# Patient Record
Sex: Male | Born: 1990 | Race: White | Hispanic: No | Marital: Single | Smoking: Former smoker
Health system: Southern US, Community
[De-identification: ages and names within clinical notes are randomized; demographics above are authoritative.]

## PROBLEM LIST (undated history)

## (undated) ENCOUNTER — Ambulatory Visit (HOSPITAL_BASED_OUTPATIENT_CLINIC_OR_DEPARTMENT_OTHER): Source: Home / Self Care

## (undated) DIAGNOSIS — J45909 Unspecified asthma, uncomplicated: Secondary | ICD-10-CM

## (undated) DIAGNOSIS — F419 Anxiety disorder, unspecified: Secondary | ICD-10-CM

## (undated) DIAGNOSIS — K219 Gastro-esophageal reflux disease without esophagitis: Secondary | ICD-10-CM

## (undated) DIAGNOSIS — F988 Other specified behavioral and emotional disorders with onset usually occurring in childhood and adolescence: Secondary | ICD-10-CM

## (undated) DIAGNOSIS — K226 Gastro-esophageal laceration-hemorrhage syndrome: Secondary | ICD-10-CM

## (undated) HISTORY — DX: Gastro-esophageal laceration-hemorrhage syndrome: K22.6

## (undated) HISTORY — PX: FINGER SURGERY: SHX640

---
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
## 2012-09-26 DIAGNOSIS — K226 Gastro-esophageal laceration-hemorrhage syndrome: Secondary | ICD-10-CM

## 2012-09-26 HISTORY — DX: Gastro-esophageal laceration-hemorrhage syndrome: K22.6

## 2012-10-18 ENCOUNTER — Encounter (HOSPITAL_COMMUNITY): Payer: Self-pay

## 2012-10-18 ENCOUNTER — Encounter (HOSPITAL_COMMUNITY)
Admission: RE | Disposition: A | Discharge status: Home / Self Care | Payer: Self-pay | Source: Ambulatory Visit | Attending: Gastroenterology

## 2012-10-18 ENCOUNTER — Emergency Department (HOSPITAL_COMMUNITY)
Admission: EM | Admit: 2012-10-18 | Discharge: 2012-10-18 | Disposition: A | Discharge status: Home / Self Care | Payer: Medicaid Other | Attending: Emergency Medicine | Admitting: Emergency Medicine

## 2012-10-18 ENCOUNTER — Telehealth: Payer: Self-pay | Admitting: Gastroenterology

## 2012-10-18 ENCOUNTER — Ambulatory Visit: Payer: Self-pay | Admitting: Gastroenterology

## 2012-10-18 ENCOUNTER — Ambulatory Visit (HOSPITAL_COMMUNITY): Admit: 2012-10-18 | Payer: Self-pay | Admitting: Gastroenterology

## 2012-10-18 ENCOUNTER — Ambulatory Visit (HOSPITAL_COMMUNITY): Payer: Medicaid Other | Admitting: Anesthesiology

## 2012-10-18 ENCOUNTER — Observation Stay (HOSPITAL_COMMUNITY)
Admission: RE | Admit: 2012-10-18 | Discharge: 2012-10-19 | Disposition: A | Discharge status: Home / Self Care | Payer: Medicaid Other | Source: Ambulatory Visit | Attending: Gastroenterology | Admitting: Gastroenterology

## 2012-10-18 ENCOUNTER — Encounter (HOSPITAL_COMMUNITY): Payer: Self-pay | Admitting: Anesthesiology

## 2012-10-18 ENCOUNTER — Encounter (HOSPITAL_COMMUNITY): Payer: Self-pay | Admitting: *Deleted

## 2012-10-18 DIAGNOSIS — R51 Headache: Secondary | ICD-10-CM | POA: Insufficient documentation

## 2012-10-18 DIAGNOSIS — K226 Gastro-esophageal laceration-hemorrhage syndrome: Principal | ICD-10-CM

## 2012-10-18 DIAGNOSIS — F17200 Nicotine dependence, unspecified, uncomplicated: Secondary | ICD-10-CM | POA: Insufficient documentation

## 2012-10-18 DIAGNOSIS — Z5309 Procedure and treatment not carried out because of other contraindication: Secondary | ICD-10-CM | POA: Insufficient documentation

## 2012-10-18 DIAGNOSIS — R1013 Epigastric pain: Secondary | ICD-10-CM | POA: Insufficient documentation

## 2012-10-18 DIAGNOSIS — F411 Generalized anxiety disorder: Secondary | ICD-10-CM | POA: Insufficient documentation

## 2012-10-18 DIAGNOSIS — K219 Gastro-esophageal reflux disease without esophagitis: Secondary | ICD-10-CM | POA: Insufficient documentation

## 2012-10-18 DIAGNOSIS — Z8659 Personal history of other mental and behavioral disorders: Secondary | ICD-10-CM | POA: Insufficient documentation

## 2012-10-18 DIAGNOSIS — K208 Other esophagitis without bleeding: Secondary | ICD-10-CM

## 2012-10-18 DIAGNOSIS — K920 Hematemesis: Secondary | ICD-10-CM | POA: Insufficient documentation

## 2012-10-18 DIAGNOSIS — D62 Acute posthemorrhagic anemia: Secondary | ICD-10-CM | POA: Insufficient documentation

## 2012-10-18 DIAGNOSIS — R112 Nausea with vomiting, unspecified: Secondary | ICD-10-CM | POA: Insufficient documentation

## 2012-10-18 HISTORY — DX: Gastro-esophageal reflux disease without esophagitis: K21.9

## 2012-10-18 HISTORY — DX: Other specified behavioral and emotional disorders with onset usually occurring in childhood and adolescence: F98.8

## 2012-10-18 HISTORY — DX: Anxiety disorder, unspecified: F41.9

## 2012-10-18 HISTORY — PX: ESOPHAGOGASTRODUODENOSCOPY (EGD) WITH PROPOFOL: SHX5813

## 2012-10-18 HISTORY — PX: ESOPHAGOGASTRODUODENOSCOPY: SHX5428

## 2012-10-18 LAB — CBC WITH DIFFERENTIAL/PLATELET
Eosinophils Relative: 4 % (ref 0–5)
Lymphocytes Relative: 27 % (ref 12–46)
MCH: 29.4 pg (ref 26.0–34.0)
MCV: 82 fL (ref 78.0–100.0)
Neutrophils Relative %: 59 % (ref 43–77)
WBC: 9.1 10*3/uL (ref 4.0–10.5)

## 2012-10-18 LAB — LIPASE, BLOOD: Lipase: 16 U/L (ref 11–59)

## 2012-10-18 LAB — URINALYSIS, MICROSCOPIC ONLY
Bilirubin Urine: NEGATIVE
Ketones, ur: NEGATIVE mg/dL
Nitrite: NEGATIVE
Specific Gravity, Urine: 1.034 — ABNORMAL HIGH (ref 1.005–1.030)

## 2012-10-18 LAB — COMPREHENSIVE METABOLIC PANEL
Albumin: 4.5 g/dL (ref 3.5–5.2)
BUN: 17 mg/dL (ref 6–23)
Chloride: 101 mEq/L (ref 96–112)
Creatinine, Ser: 0.92 mg/dL (ref 0.50–1.35)
Glucose, Bld: 102 mg/dL — ABNORMAL HIGH (ref 70–99)
Potassium: 3.7 mEq/L (ref 3.5–5.1)
Total Bilirubin: 0.6 mg/dL (ref 0.3–1.2)

## 2012-10-18 LAB — CBC
HCT: 40.1 % (ref 39.0–52.0)
Hemoglobin: 14.2 g/dL (ref 13.0–17.0)
Hemoglobin: 12 g/dL — ABNORMAL LOW (ref 13.0–17.0)
MCHC: 35.4 g/dL (ref 30.0–36.0)
MCV: 82.9 fL (ref 78.0–100.0)
Platelets: 264 10*3/uL (ref 150–400)
RBC: 4.01 MIL/uL — ABNORMAL LOW (ref 4.22–5.81)
RDW: 12.5 % (ref 11.5–15.5)
WBC: 9.8 10*3/uL (ref 4.0–10.5)

## 2012-10-18 LAB — BASIC METABOLIC PANEL
BUN: 29 mg/dL — ABNORMAL HIGH (ref 6–23)
CO2: 26 mEq/L (ref 19–32)
Chloride: 101 mEq/L (ref 96–112)
Creatinine, Ser: 0.98 mg/dL (ref 0.50–1.35)
Glucose, Bld: 121 mg/dL — ABNORMAL HIGH (ref 70–99)
Potassium: 4 mEq/L (ref 3.5–5.1)

## 2012-10-18 LAB — TYPE AND SCREEN: ABO/RH(D): O POS

## 2012-10-18 LAB — MRSA PCR SCREENING: MRSA by PCR: NEGATIVE

## 2012-10-18 SURGERY — EGD (ESOPHAGOGASTRODUODENOSCOPY)
Anesthesia: Monitor Anesthesia Care

## 2012-10-18 SURGERY — ESOPHAGOGASTRODUODENOSCOPY (EGD) WITH PROPOFOL
Anesthesia: Monitor Anesthesia Care

## 2012-10-18 MED ORDER — LACTATED RINGERS IV SOLN
INTRAVENOUS | Status: DC | PRN
  Administered 2012-10-18: 13:00:00 via INTRAVENOUS

## 2012-10-18 MED ORDER — PHENOL 1.4 % MT LIQD
1.0000 | OROMUCOSAL | Status: DC | PRN
  Administered 2012-10-18 – 2012-10-19 (×2): 1 via OROMUCOSAL
  Filled 2012-10-18: qty 177

## 2012-10-18 MED ORDER — ESOMEPRAZOLE MAGNESIUM 40 MG PO CPDR: 40.0000 mg | DELAYED_RELEASE_CAPSULE | Freq: Every day | ORAL | Status: DC

## 2012-10-18 MED ORDER — EPINEPHRINE HCL 0.1 MG/ML IJ SOLN
INTRAMUSCULAR | Status: AC
  Filled 2012-10-18: qty 10

## 2012-10-18 MED ORDER — SODIUM CHLORIDE 0.9 % IV SOLN
INTRAVENOUS | Status: DC
  Administered 2012-10-18 – 2012-10-19 (×2): via INTRAVENOUS
  Filled 2012-10-18 (×4): qty 1000

## 2012-10-18 MED ORDER — MORPHINE SULFATE 2 MG/ML IJ SOLN
2.0000 mg | INTRAMUSCULAR | Status: DC | PRN
  Administered 2012-10-18 – 2012-10-19 (×3): 2 mg via INTRAVENOUS
  Filled 2012-10-18 (×3): qty 1

## 2012-10-18 MED ORDER — MIDAZOLAM HCL 5 MG/5ML IJ SOLN
INTRAMUSCULAR | Status: DC | PRN
  Administered 2012-10-18 (×2): 1 mg via INTRAVENOUS

## 2012-10-18 MED ORDER — FENTANYL CITRATE 0.05 MG/ML IJ SOLN
INTRAMUSCULAR | Status: DC | PRN
  Administered 2012-10-18 (×2): 50 ug via INTRAVENOUS

## 2012-10-18 MED ORDER — PROPOFOL 10 MG/ML IV EMUL
INTRAVENOUS | Status: DC | PRN
  Administered 2012-10-18: 75 ug/kg/min via INTRAVENOUS

## 2012-10-18 MED ORDER — SODIUM CHLORIDE 0.9 % IV SOLN
INTRAVENOUS | Status: DC
  Administered 2012-10-18: 10 mL/h via INTRAVENOUS
  Administered 2012-10-19: via INTRAVENOUS

## 2012-10-18 MED ORDER — LORAZEPAM 2 MG/ML IJ SOLN
0.5000 mg | Freq: Four times a day (QID) | INTRAMUSCULAR | Status: DC | PRN
  Administered 2012-10-18 – 2012-10-19 (×3): 0.5 mg via INTRAVENOUS
  Filled 2012-10-18 (×3): qty 1

## 2012-10-18 MED ORDER — PANTOPRAZOLE SODIUM 40 MG IV SOLR
40.0000 mg | Freq: Two times a day (BID) | INTRAVENOUS | Status: DC
  Administered 2012-10-18 – 2012-10-19 (×2): 40 mg via INTRAVENOUS
  Filled 2012-10-18 (×4): qty 40

## 2012-10-18 MED ORDER — ONDANSETRON HCL 4 MG/2ML IJ SOLN
4.0000 mg | Freq: Once | INTRAMUSCULAR | Status: AC
  Administered 2012-10-18: 4 mg via INTRAVENOUS
  Filled 2012-10-18: qty 2

## 2012-10-18 MED ORDER — PROMETHAZINE HCL 25 MG/ML IJ SOLN: 6.2500 mg | INTRAMUSCULAR | Status: DC | PRN

## 2012-10-18 MED ORDER — SODIUM CHLORIDE 0.9 % IJ SOLN
INTRAMUSCULAR | Status: DC | PRN
  Administered 2012-10-18: 14:00:00

## 2012-10-18 MED ORDER — KETAMINE HCL 10 MG/ML IJ SOLN
INTRAMUSCULAR | Status: DC | PRN
  Administered 2012-10-18 (×2): 10 mg via INTRAVENOUS

## 2012-10-18 MED ORDER — MIDAZOLAM HCL 10 MG/2ML IJ SOLN
INTRAMUSCULAR | Status: DC | PRN
  Administered 2012-10-18 (×4): 2 mg via INTRAVENOUS

## 2012-10-18 MED ORDER — MIDAZOLAM HCL 10 MG/2ML IJ SOLN
INTRAMUSCULAR | Status: AC
  Filled 2012-10-18: qty 4

## 2012-10-18 MED ORDER — DIPHENHYDRAMINE HCL 50 MG/ML IJ SOLN
INTRAMUSCULAR | Status: DC | PRN
  Administered 2012-10-18: 12.5 mg via INTRAVENOUS
  Administered 2012-10-18: 25 mg via INTRAVENOUS
  Administered 2012-10-18: 12.5 mg via INTRAVENOUS

## 2012-10-18 MED ORDER — BUTAMBEN-TETRACAINE-BENZOCAINE 2-2-14 % EX AERO
INHALATION_SPRAY | CUTANEOUS | Status: DC | PRN
  Administered 2012-10-18: 2 via TOPICAL

## 2012-10-18 MED ORDER — ONDANSETRON HCL 4 MG PO TABS: 4.0000 mg | ORAL_TABLET | Freq: Four times a day (QID) | ORAL | Status: DC

## 2012-10-18 MED ORDER — PANTOPRAZOLE SODIUM 40 MG IV SOLR
40.0000 mg | Freq: Once | INTRAVENOUS | Status: AC
  Administered 2012-10-18: 40 mg via INTRAVENOUS
  Filled 2012-10-18: qty 40

## 2012-10-18 MED ORDER — FENTANYL CITRATE 0.05 MG/ML IJ SOLN
INTRAMUSCULAR | Status: DC | PRN
  Administered 2012-10-18: 25 ug via INTRAVENOUS
  Administered 2012-10-18: 50 ug via INTRAVENOUS
  Administered 2012-10-18: 25 ug via INTRAVENOUS

## 2012-10-18 MED ORDER — ONDANSETRON HCL 4 MG PO TABS: 4.0000 mg | ORAL_TABLET | Freq: Four times a day (QID) | ORAL | Status: DC | PRN

## 2012-10-18 MED ORDER — LIDOCAINE HCL (CARDIAC) 10 MG/ML IV SOLN
INTRAVENOUS | Status: DC | PRN
  Administered 2012-10-18: 60 mg via INTRAVENOUS

## 2012-10-18 MED ORDER — PANTOPRAZOLE SODIUM 40 MG IV SOLR: 40.0000 mg | Freq: Two times a day (BID) | INTRAVENOUS | Status: DC

## 2012-10-18 MED ORDER — FENTANYL CITRATE 0.05 MG/ML IJ SOLN
INTRAMUSCULAR | Status: AC
  Filled 2012-10-18: qty 4

## 2012-10-18 MED ORDER — ONDANSETRON HCL 4 MG/2ML IJ SOLN
4.0000 mg | Freq: Four times a day (QID) | INTRAMUSCULAR | Status: DC | PRN
  Administered 2012-10-18 (×2): 4 mg via INTRAVENOUS
  Filled 2012-10-18 (×2): qty 2

## 2012-10-18 MED ORDER — ONDANSETRON HCL 4 MG/2ML IJ SOLN
INTRAMUSCULAR | Status: DC | PRN
  Administered 2012-10-18: 4 mg via INTRAVENOUS

## 2012-10-18 MED ORDER — DIPHENHYDRAMINE HCL 50 MG/ML IJ SOLN
INTRAMUSCULAR | Status: AC
  Filled 2012-10-18: qty 1

## 2012-10-18 SURGICAL SUPPLY — 14 items

## 2012-10-18 NOTE — H&P (Signed)
Primary Care Physician:  No PCP Per Patient Primary Gastroenterologist:    none  CHIEF COMPLAINT:  vomiting blood  HPI: Erik Santana is a 22 y.o. male who presented early this am with upper abdominal pain, nausea, vomiting and hematemesis. He has a history of GERD and chronic upper abdominal pain (since 9th grade). He had been on Nexium since 9th grade until a few months ago. No NSAIDS except for a Goody powder for a headache yesterday. Vomiting started yesterday. He first vomited the [place] Powder followed by bright red blood. He has had 3 episodes of large volume hematemesis per family. He is also reporting black stools today. Patient went to ED around 4am today. Hgb was 16. He was sent home with arrangements to see [place] in the office today. Because of ongoing hematemesis we made decision to have patient come straight to endo lab at [facility] for EGD. Patient is stable.   Other than above, patient gives a history of tobacco use and anxiety.     Past Medical History  Diagnosis Date  . Anxiety   . GERD (gastroesophageal reflux disease)   . ADD (attention deficit disorder)     Past Surgical History  Procedure Laterality Date  . Finger surgery      Prior to Admission medications   Medication Sig Start Date End Date Taking? Authorizing Provider  esomeprazole (NEXIUM) 40 MG capsule Take 1 capsule (40 mg total) by mouth daily. 10/18/12   Arman Filter, NP  ondansetron (ZOFRAN) 4 MG tablet Take 1 tablet (4 mg total) by mouth every 6 (six) hours. 10/18/12   Arman Filter, NP    No current facility-administered medications for this encounter.    Allergies as of 10/18/2012  . (No Known Allergies)    FMH: No stomach or colon cancer. Grandfather had gastric ulcers.  History   Social History  . Marital Status: Single    Spouse Name: N/A    Number of Children: N/A  . Years of Education: N/A   Occupational History  . Not on file.   Social History Main Topics  . Smoking status: Current  Every Day Smoker -- 0.50 packs/day    Types: Cigarettes  . Smokeless tobacco: Not on file  . Alcohol Use: Yes     Comment: occ  . Drug Use: No  . Sexually Active: Not on file      Review of Systems:  All systems reviewed and negative except where noted in HPI  Physical Exam: Vital signs in last 24 hours: Temp:  [97.9 F (36.6 C)] 97.9 F (36.6 C) (04/23 0417) Pulse Rate:  [60-80] 60 (04/23 0602) Resp:  [16-18] 16 (04/23 0602) BP: (124-133)/(79-80) 133/80 mmHg (04/23 0602) SpO2:  [96 %-100 %] 100 % (04/23 0602) Weight:  [175 lb (79.379 kg)] 175 lb (79.379 kg) (04/23 0417)   General:   Alert,  well-developed, white male in NAD Head:  Normocephalic and atraumatic. Eyes:  Sclera clear, no icterus.   Conjunctiva pink. Ears:  Normal auditory acuity. Mouth:  No deformity or lesions.  Neck:  Supple; no masses . Lungs:  Clear throughout to auscultation.   No wheezes, crackles, or rhonchi. No acute distress. Heart:  Tachy at 118 Abdomen:  Soft, nondistended, minimal epigastric tenderness to palpation. No masses, hepatomegaly. No obvious masses.  Normal bowel .    Msk:  Symmetrical without gross deformities.. Pulses:  Normal pulses noted. Extremities:  Without edema. Neurologic:  Alert and  oriented x4;  grossly normal  neurologically. Skin:  Intact without significant lesions or rashes. Cervical Nodes:  No significant cervical adenopathy. Psych:  Alert and cooperative. Slightly anxious  Lab Results:  Recent Labs  10/18/12 0445  WBC 9.1  HGB 16.0  HCT 44.6  PLT 292   BMET  Recent Labs  10/18/12 0445  NA 139  K 3.7  CL 101  CO2 28  GLUCOSE 102*  BUN 17  CREATININE 0.92  CALCIUM 10.1   LFT  Recent Labs  10/18/12 0445  PROT 7.5  ALBUMIN 4.5  AST 15  ALT 12  ALKPHOS 71  BILITOT 0.6    Impression / Plan:   67. 22 year old male with chronic epigastric pain presenting with progressive pain associated with nausea, vomiting and hematemesis. Hgb in ED at 5am  was 16. Patient released from ED. Patient stable except mildly tachycardic. Now here for outpatient EGD for further evaluation. Rule out PUD, mallory weiss tear. Interestingly his BUN is normal. Will recheck a CBC, BMET. Further recommendations following EGD.   2. Tobacco abuse  3. Anxiety  4. Chronic GERD / upper abdominal pain. Patient has been off Nexium for last few months.   LOS: 0 days   Willette Cluster  10/18/2012, 11:19 AM  Chart was reviewed and patient was examined. X-rays and lab were reviewed.   One day history of recurrent hematemesis and epigastric pain. Suspect active peptic ulcer disease. Otherwise tear may also be a cause for acute GI bleeding. Plan as stated above.  Barbette Hair. Arlyce Dice, M.D., [facility] [phone]

## 2012-10-18 NOTE — ED Notes (Signed)
Pt reports that he had not felt well all day yesterday; pt became nauseous early this am and vomited "blood" x 2; pt reports hx of GERD but states "I have never done anything like this before"; pt states nausea is some better after vomiting; pt denies abd pain.

## 2012-10-18 NOTE — ED Provider Notes (Signed)
Medical screening examination/treatment/procedure(s) were performed by non-physician practitioner and as supervising physician I was immediately available for consultation/collaboration.  Delton Stelle M Rickie Gange, MD 10/18/12 0721 

## 2012-10-18 NOTE — Progress Notes (Signed)
EGD shows a bleeding M-W tear - successfully treated with endoscopic clips and epinephrine.

## 2012-10-18 NOTE — Op Note (Signed)
[facility] [address] [place], [address]   ENDOSCOPY PROCEDURE REPORT  PATIENT: Erik Santana, Erik Santana  MR#: [id number] BIRTHDATE: 11/26/1990 , 21  yrs. old GENDER: Male ENDOSCOPIST: Louis Meckel, MD REFERRED BY: PROCEDURE DATE:  10/18/2012 PROCEDURE:  EGD, diagnostic ASA CLASS:     Class II INDICATIONS:  Hematemesis. MEDICATIONS: These medications were titrated to patient response per physician's verbal order,  Fentanyl 100 mcg IV, and Versed 8 mg IV  TOPICAL ANESTHETIC: Cetacaine Spray  DESCRIPTION OF PROCEDURE: After the risks benefits and alternatives of the procedure were thoroughly explained, informed consent was obtained.  The Pentax Gastroscope [id number] endoscope was introduced through the mouth and advanced to the stomach cardia. Limited by patient cooperation.  The instrument was slowly withdrawn as the mucosa was fully examined.      In the esophagus there were erosions at the GE junction.  The scope was passed into the stomach where fresh blood was seen.  Because of very poor patient cooperation the procedure had to be terminated. In the esophagus there were erosions at the GE junction.  The scope was passed into the stomach where fresh blood was seen.  Because of very poor patient cooperation the procedure had to be terminated. The scope was then withdrawn from the patient and the procedure completed.  COMPLICATIONS: There were no complications.  ENDOSCOPIC IMPRESSIoN 1.  erosive esophagitis 2.  fresh blood in the stomach 3.  incomplete examination  RECOMMENDATIONS: repeat exam with propofol sedation REPEAT EXAM:  eSigned:  Louis Meckel, MD 10/18/2012 12:28 PM   CC:

## 2012-10-18 NOTE — ED Provider Notes (Signed)
History     CSN: [id number]  Arrival date & time 10/18/12  0407   First MD Initiated Contact with Patient 10/18/12 [phone]      Chief Complaint  Patient presents with  . Hematemesis    (Consider location/radiation/quality/duration/timing/severity/associated sxs/prior treatment) HPI Comments: Patient states, that he was awakened from sleep with acute onset of epigastric pain, nausea and vomiting.  He vomited twice.  A large amount of blood.  He reports he has a history of severe GERD, for which as a child.  He was treated with by mouth Nexium he has not had any medication for at least 6 months, as he's outgrown his pediatrician, and has not established with a primary care physician.  Has not tried any over-the-counter medications for his symptoms.  Denies any fever, diarrhea, previous history of a GI bleed  The history is provided by the patient.    Past Medical History  Diagnosis Date  . Anxiety   . GERD (gastroesophageal reflux disease)   . ADD (attention deficit disorder)     Past Surgical History  Procedure Laterality Date  . Finger surgery      No family history on file.  History  Substance Use Topics  . Smoking status: Current Every Day Smoker -- 0.50 packs/day    Types: Cigarettes  . Smokeless tobacco: Not on file  . Alcohol Use: Yes     Comment: occ      Review of Systems  Constitutional: Negative for fever and chills.  Respiratory: Negative for shortness of breath.   Gastrointestinal: Positive for nausea, vomiting and abdominal pain. Negative for blood in stool.  Genitourinary: Negative for decreased urine volume.  Musculoskeletal: Negative for back pain.  Skin: Negative for pallor.  Neurological: Negative for dizziness.  All other systems reviewed and are negative.    Allergies  Review of patient's allergies indicates no known allergies.  Home Medications   Current Outpatient Rx  Name  Route  Sig  Dispense  Refill  . esomeprazole (NEXIUM) 40 MG  capsule   Oral   Take 1 capsule (40 mg total) by mouth daily.   30 capsule   0   . ondansetron (ZOFRAN) 4 MG tablet   Oral   Take 1 tablet (4 mg total) by mouth every 6 (six) hours.   12 tablet   0     BP 124/79  Pulse 80  Temp(Src) 97.9 F (36.6 C)  Resp 18  Ht 6\' 2"  (1.88 m)  Wt 175 lb (79.379 kg)  BMI 22.46 kg/m2  SpO2 96%  Physical Exam  Nursing note and vitals reviewed. Constitutional: He is oriented to person, place, and time. He appears well-developed and well-nourished.  HENT:  Head: Normocephalic and atraumatic.  Mouth/Throat: Oropharynx is clear and moist.  Eyes: Pupils are equal, round, and reactive to light.  Neck: Normal range of motion.  Cardiovascular: Normal rate and regular rhythm.   Pulmonary/Chest: Effort normal and breath sounds normal.  Abdominal: Soft. Bowel sounds are normal. He exhibits no distension. There is no tenderness.  Musculoskeletal: Normal range of motion.  Neurological: He is alert and oriented to person, place, and time.  Skin: Skin is warm. No pallor.    ED Course  Procedures (including critical care time)  Labs Reviewed  COMPREHENSIVE METABOLIC PANEL - Abnormal; Notable for the following:    Glucose, Bld 102 (*)    All other components within normal limits  URINALYSIS, MICROSCOPIC ONLY - Abnormal; Notable for the following:  Specific Gravity, Urine 1.034 (*)    All other components within normal limits  CBC WITH DIFFERENTIAL  LIPASE, BLOOD  TYPE AND SCREEN   No results found.   1. Hematemesis       MDM  Father showed me to photographs of bloody emesis.  On the floor Labs evaluated patient received IV Protonix, Zofran, IV fluid, and states he is feeling much better.  He is been referred to Erik Santana.  A GI specialist.  This has been discussed with patient and his family were in agreement         Erik Filter, NP 10/18/12 [phone]

## 2012-10-18 NOTE — Op Note (Signed)
[facility] [address] [place], [address]   ENDOSCOPY PROCEDURE REPORT  PATIENT: Erik Santana, Erik Santana  MR#: [id number] BIRTHDATE: February 16, 1991 , 21  yrs. old GENDER: Male ENDOSCOPIST: Louis Meckel, MD REFERRED BY: PROCEDURE DATE:  10/18/2012 PROCEDURE:  EGD w/ control of bleeding ASA CLASS:     Class II INDICATIONS:  Hematemesis. MEDICATIONS: MAC sedation, administered by CRNA TOPICAL ANESTHETIC:  DESCRIPTION OF PROCEDURE: After the risks benefits and alternatives of the procedure were thoroughly explained, informed consent was obtained.  The PENTAX GASTOROSCOPE [id number] endoscope was introduced through the mouth and advanced to the third portion of the duodenum. Without limitations.  The instrument was slowly withdrawn as the mucosa was fully examined.      Just beyond the GE junction there was a fresh clot overlying a mucosal tear with active oozing of blood.  3 endoscopic clips were applied to the area and the area was injected with 3 cc of 1:10,000 epinephrine.  Hemostasis was achieved.  There were erosive changes in the distal esophagus.   The remainder of the upper endoscopy exam was otherwise normal.  Retroflexed views revealed no abnormalities.     The scope was then withdrawn from the patient and the procedure completed.  COMPLICATIONS: There were no complications. ENDOSCOPIC IMPRESSION: 1.   actively bleeding Mallory-Weiss tear-status post application of clips and epinephrine achieving hemostasis  RECOMMENDATIONS: twice a day PPI therapy REPEAT EXAM:  eSigned:  Louis Meckel, MD 10/18/2012 2:17 PM   CC:

## 2012-10-18 NOTE — Telephone Encounter (Signed)
Pt has had coffee ground emesis and black stool never seen a GI before he aged out of his pediatrician and GI.  Pt has been scheduled for appt with Shanda Bumps today

## 2012-10-18 NOTE — Transfer of Care (Signed)
Immediate Anesthesia Transfer of Care Note  Patient: Erik Santana  Procedure(s) Performed: Procedure(s): ESOPHAGOGASTRODUODENOSCOPY (EGD) WITH PROPOFOL (N/A)  Patient Location: PACU  Anesthesia Type:MAC  Level of Consciousness: awake, alert , oriented and patient cooperative  Airway & Oxygen Therapy: Patient Spontanous Breathing and Patient connected to face mask oxygen  Post-op Assessment: Post -op Vital signs reviewed and stable  Post vital signs: stable  Complications: No apparent anesthesia complications

## 2012-10-18 NOTE — Anesthesia Preprocedure Evaluation (Signed)
Anesthesia Evaluation  Patient identified by MRN, date of birth, ID band Patient awake    Reviewed: Allergy & Precautions, H&P , NPO status , Patient's Chart, lab work & pertinent test results  Airway Mallampati: II TM Distance: >3 FB Neck ROM: Full    Dental no notable dental hx.    Pulmonary Current Smoker,  breath sounds clear to auscultation  Pulmonary exam normal       Cardiovascular negative cardio ROS  Rhythm:Regular Rate:Normal     Neuro/Psych Anxiety negative neurological ROS  negative psych ROS   GI/Hepatic negative GI ROS, Neg liver ROS,   Endo/Other  negative endocrine ROS  Renal/GU negative Renal ROS  negative genitourinary   Musculoskeletal negative musculoskeletal ROS (+)   Abdominal   Peds negative pediatric ROS (+)  Hematology negative hematology ROS (+)   Anesthesia Other Findings   Reproductive/Obstetrics negative OB ROS                           Anesthesia Physical Anesthesia Plan  ASA: II  Anesthesia Plan: MAC   Post-op Pain Management:    Induction: Intravenous  Airway Management Planned: Nasal Cannula  Additional Equipment:   Intra-op Plan:   Post-operative Plan:   Informed Consent: I have reviewed the patients History and Physical, chart, labs and discussed the procedure including the risks, benefits and alternatives for the proposed anesthesia with the patient or authorized representative who has indicated his/her understanding and acceptance.     Plan Discussed with: CRNA and Surgeon  Anesthesia Plan Comments:         Anesthesia Quick Evaluation

## 2012-10-19 ENCOUNTER — Encounter (HOSPITAL_COMMUNITY): Payer: Self-pay | Admitting: Gastroenterology

## 2012-10-19 DIAGNOSIS — K226 Gastro-esophageal laceration-hemorrhage syndrome: Principal | ICD-10-CM

## 2012-10-19 LAB — HEMOGLOBIN AND HEMATOCRIT, BLOOD: HCT: 32.7 % — ABNORMAL LOW (ref 39.0–52.0)

## 2012-10-19 LAB — CBC
HCT: 30.1 % — ABNORMAL LOW (ref 39.0–52.0)
Hemoglobin: 10.8 g/dL — ABNORMAL LOW (ref 13.0–17.0)
MCV: 82.5 fL (ref 78.0–100.0)
WBC: 9.2 10*3/uL (ref 4.0–10.5)

## 2012-10-19 MED ORDER — LORAZEPAM 0.5 MG PO TABS: 0.5000 mg | ORAL_TABLET | Freq: Three times a day (TID) | ORAL | Status: DC | PRN

## 2012-10-19 MED ORDER — ACETAMINOPHEN 325 MG PO TABS: 650.0000 mg | ORAL_TABLET | Freq: Four times a day (QID) | ORAL | Status: DC | PRN

## 2012-10-19 MED ORDER — ONDANSETRON HCL 4 MG PO TABS: 4.0000 mg | ORAL_TABLET | Freq: Four times a day (QID) | ORAL | Status: DC | PRN

## 2012-10-19 MED ORDER — OMEPRAZOLE 40 MG PO CPDR: 40.0000 mg | DELAYED_RELEASE_CAPSULE | Freq: Every day | ORAL | Status: DC

## 2012-10-19 MED ORDER — ACETAMINOPHEN 325 MG PO TABS: 650.0000 mg | ORAL_TABLET | ORAL | Status: DC | PRN

## 2012-10-19 NOTE — Anesthesia Postprocedure Evaluation (Signed)
  Anesthesia Post-op Note  Patient: Erik Santana  Procedure(s) Performed: Procedure(s) (LRB): ESOPHAGOGASTRODUODENOSCOPY (EGD) WITH PROPOFOL (N/A)  Patient Location: PACU  Anesthesia Type: MAC  Level of Consciousness: awake and alert   Airway and Oxygen Therapy: Patient Spontanous Breathing  Post-op Pain: mild  Post-op Assessment: Post-op Vital signs reviewed, Patient's Cardiovascular Status Stable, Respiratory Function Stable, Patent Airway and No signs of Nausea or vomiting  Last Vitals:  Filed Vitals:   10/19/12 0825  BP: 128/62  Pulse: 86  Temp:   Resp: 14    Post-op Vital Signs: stable   Complications: No apparent anesthesia complications

## 2012-10-19 NOTE — Plan of Care (Signed)
Problem: Phase I Progression Outcomes Goal: Pain controlled with appropriate interventions Outcome: Completed/Met Date Met:  10/19/12 Pt getting relief with PRN pain med. Goal: OOB as tolerated unless otherwise ordered Outcome: Progressing Pt ambulated to bathroom once at 1930 and had an increase in heart rate. Will continue to monitior

## 2012-10-19 NOTE — Progress Notes (Signed)
Patient ID: Erik Santana, male   DOB: 02-Feb-1991, 22 y.o.   MRN: [id number] [facility] Progress Note  Subjective: Anxious but otherwise OK- no further vomiting, no stools,c/o only mild esophageal discomfort. He says he had  A "power vomit" x1, prior to hematemesis. He had a headache yesterday as well,had taken goodies- says he has migraines, but has never been given a formal dx or migraine meds.  Objective:  Vital signs in last 24 hours: Temp:  [97.8 F (36.6 C)-98.6 F (37 C)] 97.8 F (36.6 C) (04/24 0800) Pulse Rate:  [66-127] 86 (04/24 0825) Resp:  [7-91] 14 (04/24 0825) BP: (97-151)/(59-85) 128/62 mmHg (04/24 0825) SpO2:  [96 %-100 %] 98 % (04/24 0825) Last BM Date: 10/18/12 General:   Alert,  Well-developed,anxious WM     in NAD Heart:  Regular rate and rhythm; no murmurs Pulm;clear Abdomen:  Soft, nontender and nondistended. Normal bowel sounds, without guarding, and without rebound.   Extremities:  Without edema. Neurologic:  Alert and  oriented x4;  grossly normal neurologically. Psych:  Alert and cooperative. Normal mood and affect.  Intake/Output from previous day: 04/23 0701 - 04/24 0700 In: 3125 [I.V.:3125] Out: 1890 [Urine:1850; Blood:10] Intake/Output this shift: Total I/O In: 375 [I.V.:375] Out: 200 [Urine:200]  Lab Results:  Recent Labs  10/18/12 1120 10/18/12 1633 10/19/12 0341  WBC 8.3 9.8 9.2  HGB 14.2 12.0* 10.8*  HCT 40.1 33.1* 30.1*  PLT 340 264 225   BMET  Recent Labs  10/18/12 0445 10/18/12 1120  NA 139 138  K 3.7 4.0  CL 101 101  CO2 28 26  GLUCOSE 102* 121*  BUN 17 29*  CREATININE 0.92 0.98  CALCIUM 10.1 9.5   LFT  Recent Labs  10/18/12 0445  PROT 7.5  ALBUMIN 4.5  AST 15  ALT 12  ALKPHOS 71  BILITOT 0.6     Assessment / Plan: #1 22 yo male with chronic GERD, admitted with hematemesis-M-W tear at EGD -and treated with EPI and clipping. He is stable ,hgb down 4 grams-but no current active  bleeding/equilibrating #2 Frequent headaches- probable migraines  #3 anemia- acute secondary to GI bleeding  Will allow full liquids,repeat hgb this afternoon- and consider discharge later today on BID PPI, Tylenol for pain Plan follow up in office next week. Active Problems:   Hematemesis   Other esophagitis   Mallory-Weiss tear     LOS: 1 day   Amy Esterwood  10/19/2012, 11:34 AM  I have personally taken an interval history, reviewed the chart, and examined the patient.  I agree with the extender's note, impression and recommendations.  Barbette Hair. Arlyce Dice, MD, [facility] [phone]

## 2012-10-19 NOTE — Discharge Summary (Signed)
[facility] Discharge Summary  Name: Erik Santana MRN: [id number] DOB: 1990/11/01 22 y.o. PCP:  No PCP Per Patient  Date of Admission: 10/18/2012 11:10 AM Date of Discharge: 10/19/2012 Attending Physician: Louis Meckel, MD  Discharge Diagnosis: #1 acute upper GI bleed secondary to Mallory-Weiss tear #2 anemia secondary to acute blood loss #3 anxiety #4 recurrent headaches probably migraines   Consultations:none Procedures Performed:  No results found.  GI Procedures: EGD- Dr.Thomasena Vandenheuvel  History/Physical Exam:  See Admission H&P  Admission HPI: Erik Santana is a generally healthy 22 year old white male who had presented to the emergency room on 10/18/2012 after an episode of forceful vomiting followed by several episodes of vomiting of bright red blood. He was evaluated in the emergency room and had a normal hemoglobin and therefore was allowed discharged home and asked to followup with GI. He is family called and we arranged for him to be sent back to [facility] long endoscopy for urgent upper endoscopy and this was performed by Dr. Arlyce Dice on 10/18/2012 with finding of a Mallory-Weiss tear just above the GE junction. This was actively bleeding and required at the injection and Endo Clipping for hemostasis. He was admitted to the step down unit overnight for observation and supportive management.  Hospital Course by problem list: Patient was admitted to the GI service after undergoing emergent upper endoscopy for acute hematemesis. He was found to have a Mallory-Weiss tear just above the GE junction which required appendectomy injection and Endo Clip  for control of bleeding. He remained hemodynamically stable. He was admitted overnight in the step down unit for close observation. He did sustain a  about a 4 g drop in his hemoglobin with this bleed did not require any blood transfusions . He was covered with IV Protonix and anti-emetics. On 10/19/2012 he remained stable and his  hemoglobin actually was on the rise he had no further evidence of any active bleeding area he was able to tolerate a full liquid diet without difficulty. He has very minimal complaints of soreness in his subxiphoid area. His parents had mentioned several times it is been having significant problems with anxiety and that perhaps have been triggered the vomiting. Patient says it is been having frequent headaches followed by vomiting and had also been taking Goody powders periodically for headaches. He is on discharge on 10/19/2012 with instructions to stay on a  full liquid diet over the next 24 hours and then very gradually advanced to the neck is very soft diet for the next 3-4 days. He is to limit his activity with no strenuous exercise or heavy lifting. He is cautioned to avoid all aspirin and anti-inflammatories and is instructed to use Tylenol as needed for discomfort or headaches He is to return to the emergency room immediately for any recurrence of bleeding. He will be followed up in our office next week for reassessment and repeat labs. His family is asked to establishing with a primary care physician for management of his chronic anxiety and headaches  Discharge Vitals:  BP 117/53  Pulse 86  Temp(Src) 97.4 F (36.3 C) (Oral)  Resp 20  Ht 6\' 2"  (1.88 m)  Wt 175 lb (79.379 kg)  BMI 22.46 kg/m2  SpO2 98%  Discharge Labs:  Results for orders placed during the hospital encounter of 10/18/12 (from the past 24 hour(s))  CBC     Status: Abnormal   Collection Time    10/18/12  4:33 PM      Result Value Range  WBC 9.8  4.0 - 10.5 K/uL   RBC 4.01 (*) 4.22 - 5.81 MIL/uL   Hemoglobin 12.0 (*) 13.0 - 17.0 g/dL   HCT 45.4 (*) 09.8 - 11.9 %   MCV 82.5  78.0 - 100.0 fL   MCH 29.9  26.0 - 34.0 pg   MCHC 36.3 (*) 30.0 - 36.0 g/dL   RDW 14.7  82.9 - 56.2 %   Platelets 264  150 - 400 K/uL  CBC     Status: Abnormal   Collection Time    10/19/12  3:41 AM      Result Value Range   WBC 9.2  4.0 -  10.5 K/uL   RBC 3.65 (*) 4.22 - 5.81 MIL/uL   Hemoglobin 10.8 (*) 13.0 - 17.0 g/dL   HCT 13.0 (*) 86.5 - 78.4 %   MCV 82.5  78.0 - 100.0 fL   MCH 29.6  26.0 - 34.0 pg   MCHC 35.9  30.0 - 36.0 g/dL   RDW 69.6  29.5 - 28.4 %   Platelets 225  150 - 400 K/uL  HEMOGLOBIN AND HEMATOCRIT, BLOOD     Status: Abnormal   Collection Time    10/19/12  1:18 PM      Result Value Range   Hemoglobin 11.9 (*) 13.0 - 17.0 g/dL   HCT 13.2 (*) 44.0 - 10.2 %    Disposition and follow-up:   [name] was discharged from [facility] in stable condition.    Follow-up Appointments:  Future Appointments Provider Department Dept Phone   10/24/2012 10:30 AM Amy Oswald Hillock, PA-C [facility] [phone]      Discharge Medications:   Medication List    STOP taking these medications       GOODY HEADACHE PO      TAKE these medications       acetaminophen 325 MG tablet  Commonly known as:  TYLENOL  Take 2 tablets (650 mg total) by mouth every 6 (six) hours as needed.     LORazepam 0.5 MG tablet  Commonly known as:  ATIVAN  Take 1 tablet (0.5 mg total) by mouth every 8 (eight) hours as needed for anxiety.     omeprazole 40 MG capsule  Commonly known as:  PRILOSEC  Take 1 capsule (40 mg total) by mouth daily.     ondansetron 4 MG tablet  Commonly known as:  ZOFRAN  Take 1 tablet (4 mg total) by mouth every 6 (six) hours as needed for nausea.        Signed: Mike Gip 10/19/2012, 3:45 PM

## 2012-10-20 ENCOUNTER — Encounter: Payer: Self-pay | Admitting: Internal Medicine

## 2012-10-24 ENCOUNTER — Ambulatory Visit: Payer: Self-pay | Admitting: Physician Assistant

## 2012-10-27 ENCOUNTER — Encounter: Payer: Self-pay | Admitting: Physician Assistant

## 2012-10-27 ENCOUNTER — Ambulatory Visit (INDEPENDENT_AMBULATORY_CARE_PROVIDER_SITE_OTHER): Payer: Self-pay | Admitting: Physician Assistant

## 2012-10-27 VITALS — BP 118/60 | HR 88 | Ht 74.0 in | Wt 156.0 lb

## 2012-10-27 DIAGNOSIS — F411 Generalized anxiety disorder: Secondary | ICD-10-CM | POA: Insufficient documentation

## 2012-10-27 DIAGNOSIS — K226 Gastro-esophageal laceration-hemorrhage syndrome: Secondary | ICD-10-CM

## 2012-10-27 DIAGNOSIS — K219 Gastro-esophageal reflux disease without esophagitis: Secondary | ICD-10-CM

## 2012-10-27 DIAGNOSIS — F419 Anxiety disorder, unspecified: Secondary | ICD-10-CM | POA: Insufficient documentation

## 2012-10-27 MED ORDER — LORAZEPAM 0.5 MG PO TABS: ORAL_TABLET | ORAL | Status: DC

## 2012-10-27 MED ORDER — OMEPRAZOLE 40 MG PO CPDR: 40.0000 mg | DELAYED_RELEASE_CAPSULE | Freq: Every day | ORAL | Status: DC

## 2012-10-27 NOTE — Progress Notes (Signed)
Subjective:    Patient ID: Erik Santana, male    DOB: May 21, 1991, 22 y.o.   MRN: [id number]  HPI Erik Santana  is a 22 year old white male who comes in today for a post hospital followup. He was hospitalized 1 week ago overnight with an acute upper GI bleed. He had an episode of forceful vomiting on 10/17/2012 followed by several episodes of vomiting of what was described as large amounts of bright red blood. He was initially evaluated in the emergency room during the night had a normal hemoglobin and was therefore allowed discharged to home and asked to follow up with GI. His family called our office on the morning of 423 and it was arranged for him to be sent back to [facility] long for emergency EGD. This was done per Dr. Arlyce Dice with finding of a Mallory-Weiss tear just above the GE junction. This was actively bleeding and was injected with epinephrine and Endo Clipped for hemostasis. He was admitted overnight for observation. He had a benign hospital course and did not have any further bleeding. He did not require transfusion. His hemoglobin on initial presentation to the ER was 16 with hematocrit of 44. Discharge hemoglobin was 11.9 hematocrit 34. Patient had related that he had had problems with acid reflux type symptoms for a long time and had intermittently taken antacids etc. He states he feels well currently and has not had any more problems since leaving the hospital. He specifically denies any further nausea or vomiting, he denies any dysphagia or odynophagia. His bowels a been normal and he has not noted any blood in the stools. He has not had to take any of the Zofran he was given on discharge. He has been eating very soft foods and would like to advance his Diet. His father who accompanies him states that he has had anxiety problems for a long time and had been given medication by his pediatrician at 1 point but has age doubt and does not have a primary care physician as yet. They have apply for  Medicaid for him and would like to establish with primary care physician. Patient states that his anxiety symptoms have been present for at least the past couple of years, he says he has chronic difficulty sleeping and feels jittery and anxious frequently at times has diaphoresis and an unsettled stomach due to anxiety. He also has frequent headaches, has never been given a diagnosis of migraine. He had been taking Goody powders which he stopped since this hospitalization and says he's just trying to relax and put a cold pack on his head when he has a headache the    Review of Systems  Constitutional: Negative.   HENT: Negative.   Eyes: Negative.   Respiratory: Negative.   Cardiovascular: Negative.   Gastrointestinal: Negative.   Endocrine: Negative.   Genitourinary: Negative.   Musculoskeletal: Negative.   Skin: Negative.   Allergic/Immunologic: Negative.   Neurological: Negative.   Hematological: Negative.   Psychiatric/Behavioral: Positive for sleep disturbance. The patient is nervous/anxious.    Outpatient Prescriptions Prior to Visit  Medication Sig Dispense Refill  . acetaminophen (TYLENOL) 325 MG tablet Take 2 tablets (650 mg total) by mouth every 6 (six) hours as needed.  60 tablet  0  . omeprazole (PRILOSEC) 40 MG capsule Take 1 capsule (40 mg total) by mouth daily.  60 capsule  1  . ondansetron (ZOFRAN) 4 MG tablet Take 1 tablet (4 mg total) by mouth every 6 (six) hours as needed for  nausea.  20 tablet  1  . LORazepam (ATIVAN) 0.5 MG tablet Take 1 tablet (0.5 mg total) by mouth every 8 (eight) hours as needed for anxiety.  30 tablet  0   No facility-administered medications prior to visit.   No Known Allergies Patient Active Problem List   Diagnosis Date Noted  . Anxiety state, unspecified 10/27/2012  . Hematemesis 10/18/2012  . Other esophagitis 10/18/2012  . Mallory-Weiss tear 10/18/2012     History  Substance Use Topics  . Smoking status: Current Every Day Smoker  -- 0.50 packs/day    Types: Cigarettes  . Smokeless tobacco: Never Used  . Alcohol Use: Yes     Comment: occ   family history is not on file.     Objective:   Physical Exam Well-developed thin young white male in no acute distress accompanied by his father blood pressure 118/60 pulse 88 height 6 foot 2 weight 156. HEENT ;nontraumatic normocephalic EOMI PERRLA sclera anicteric, Neck;Supple no JVD, Cardiovascular; regular rate and rhythm with S1-S2 no murmur or gallop, Pulmonary; clear bilaterally, Abdomen; soft nontender nondistended bowel sounds active there is no palpable mass or hepatosplenomegaly, Rectal; exam not done, Extremities; no clubbing cyanosis or edema skin warm and dry, Psych ;mood and affect appropriate he is anxious       Assessment & Plan:  #32 22 year old male status post Mallory-Weiss tear with acute GI bleed 10/20/2002 . Patient required EGD with endoscopic therapy to control bleeding. #2  4.5 g drop in hemoglobin with this bleed #3 underlying chronic GERD #4 underlying chronic anxiety #5 frequent headaches rule out migraines  Plan; continue omeprazole 40 mg by mouth daily he was given several refills I Erik Santana refill Ativan one time-0.5 mg by mouth once daily as needed for anxiety #30 no refills  Patient and family were advised they need to tablets with a primary care physician, and that he may also need referral to a psychiatrist for definite diagnosis showed long-term medication be indicated. They would like to establish with [employer] and were provided with primary care providers phone number numbers . He Erik Santana followup with Dr. Arlyce Dice as needed.

## 2012-10-27 NOTE — Patient Instructions (Addendum)
We sent refills on the Prilosec to [facility] [address].  We have given you a printed prescription,  Ativan 0.5 mg, take 1 tab daily.  [facility] , [place],  Dr Yetta Barre number is [phone]. [facility], [place], Dr. Birdie Sons, [phone].

## 2012-10-30 NOTE — Progress Notes (Signed)
Reviewed and agree with management. Sanaiyah Kirchhoff D. Edenilson Austad, M.D., FACG  

## 2012-11-11 IMAGING — US US ABDOMEN COMPLETE
1 series · 14 of 25 positions shown · non-contrast
Comparison: None.

CLINICAL DATA: Periumbilical pain, nausea and vomiting

COMPLETE ABDOMINAL ULTRASOUND

[Series 1: us abdomen complete · 0.35mm/px · 14 of 65 slices shown]
[im 1/65]
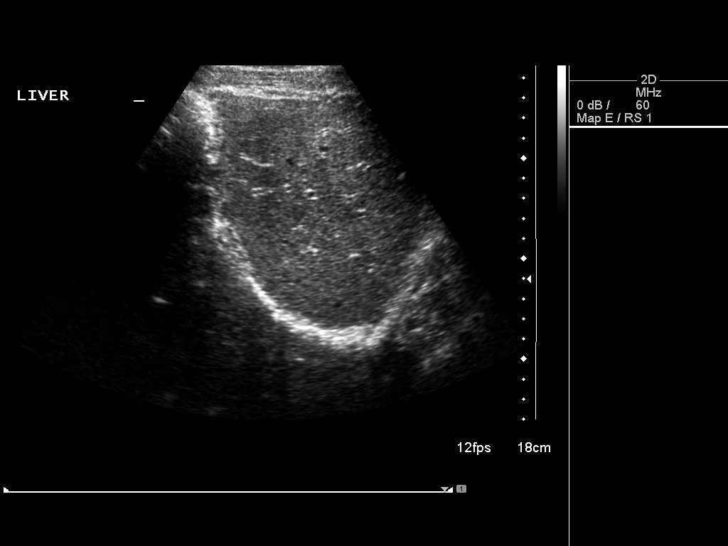
[im 6/65]
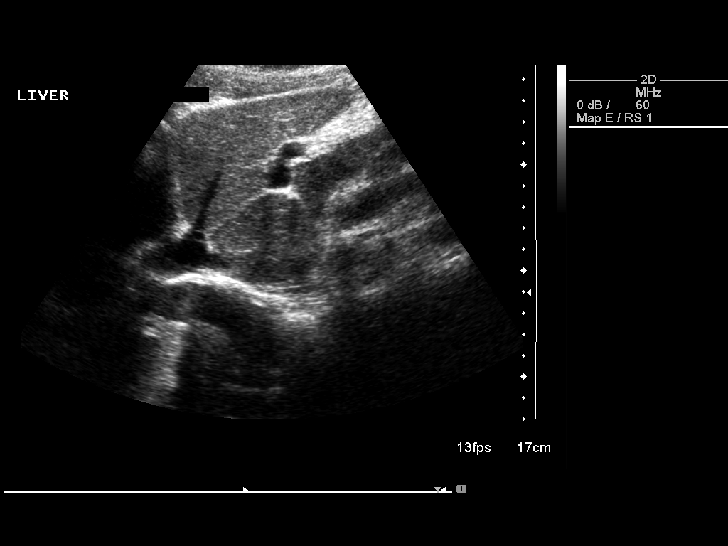
[im 11/65]
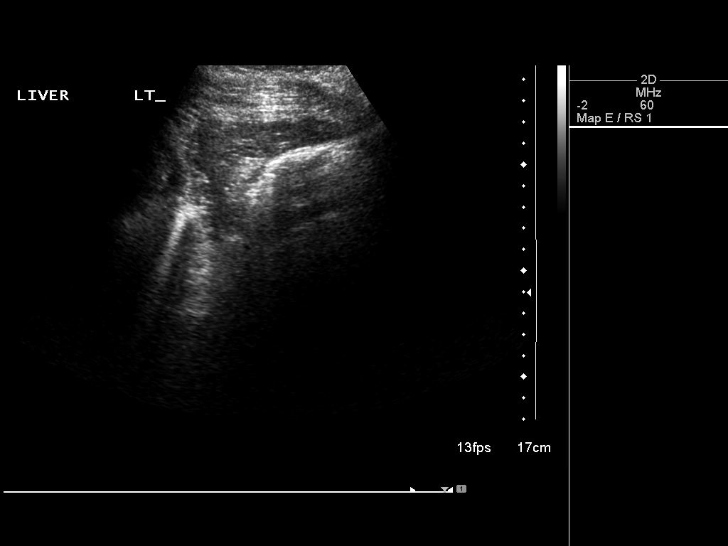
[im 17/65]
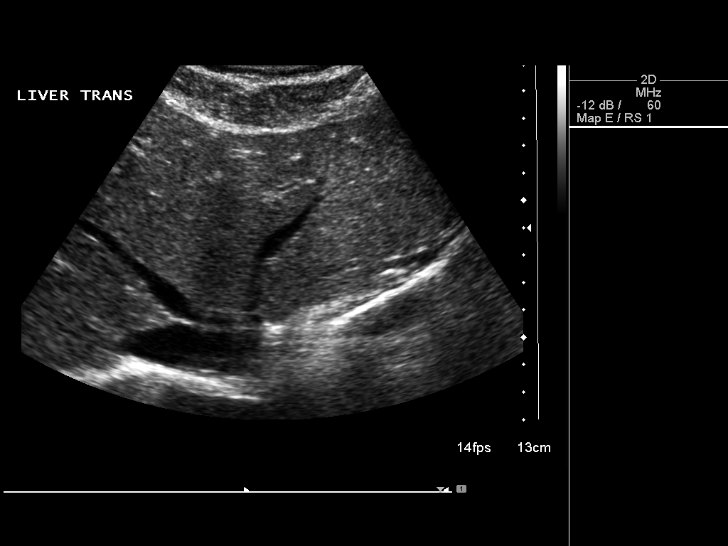
[im 22/65]
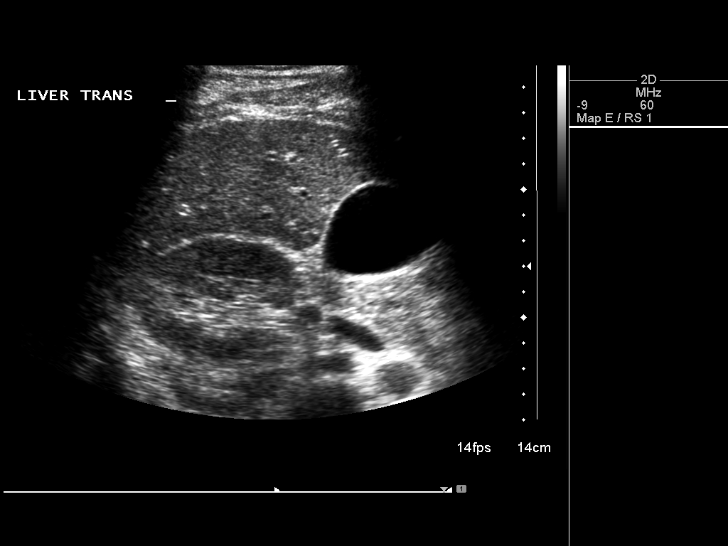
[im 25/65]
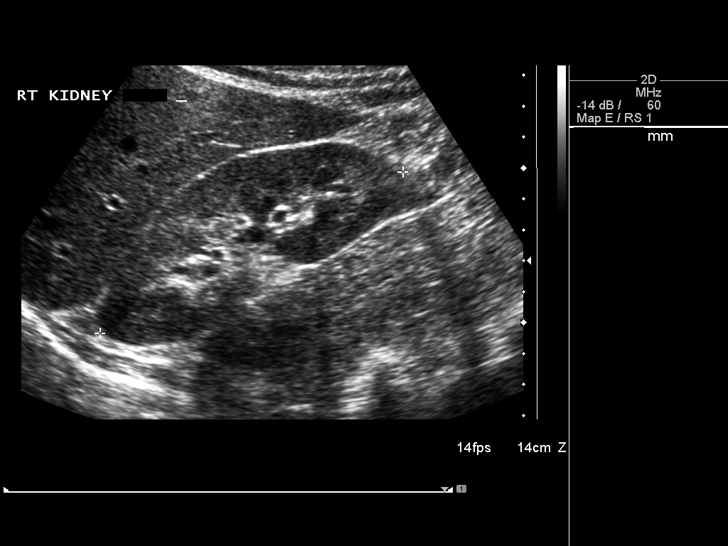
[im 30/65]
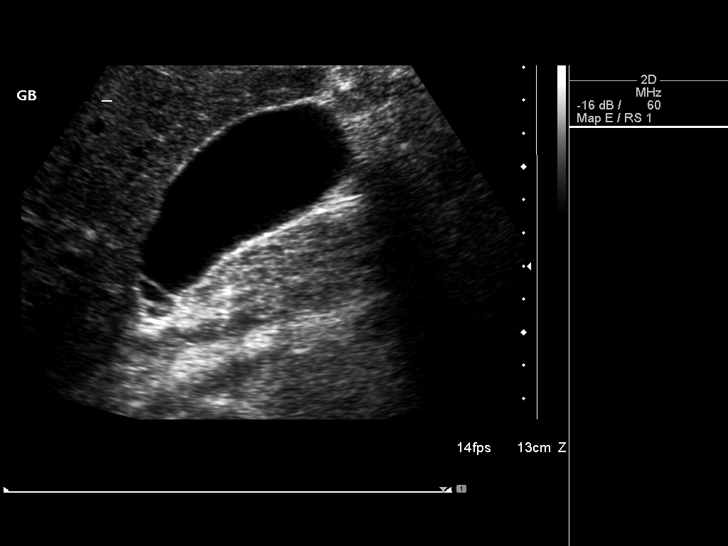
[im 35/65]
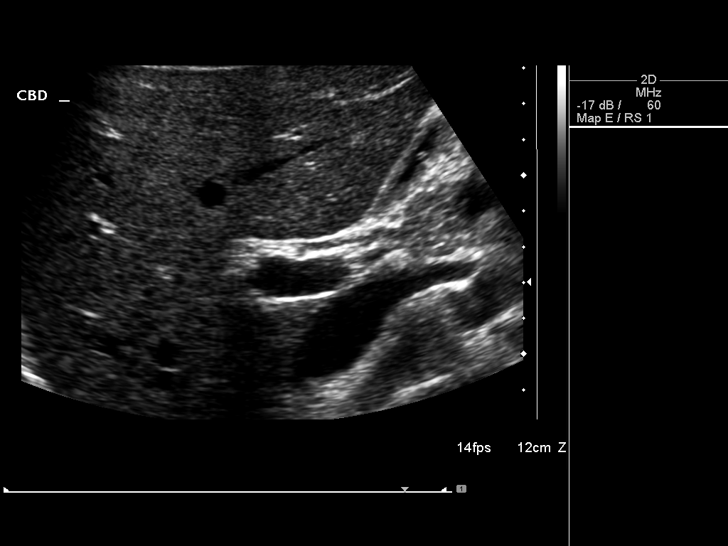
[im 41/65]
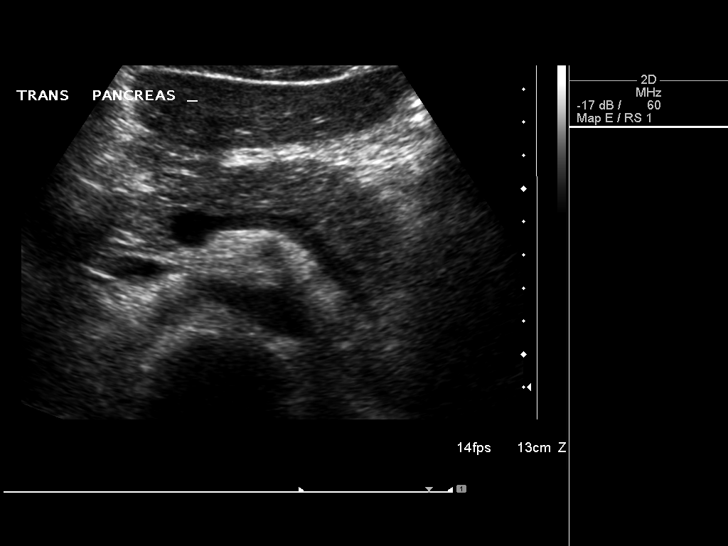
[im 43/65]
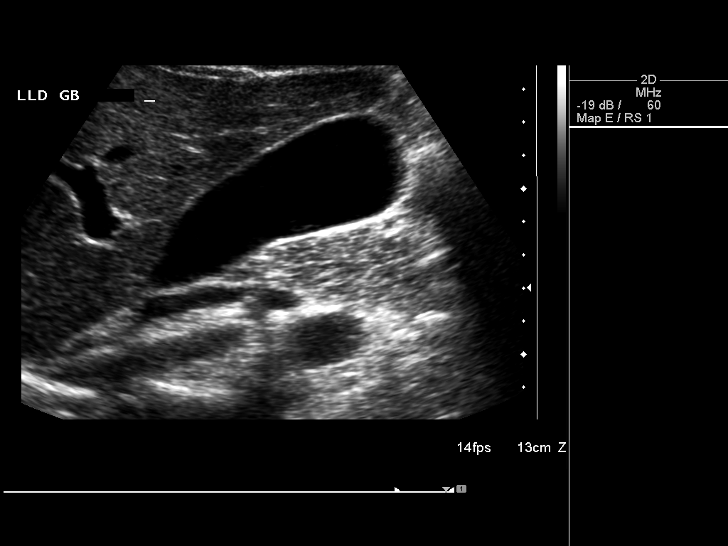
[im 49/65]
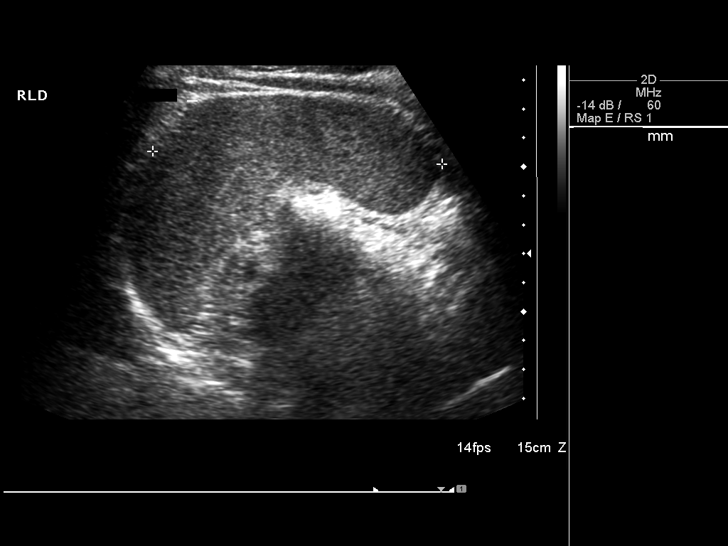
[im 54/65]
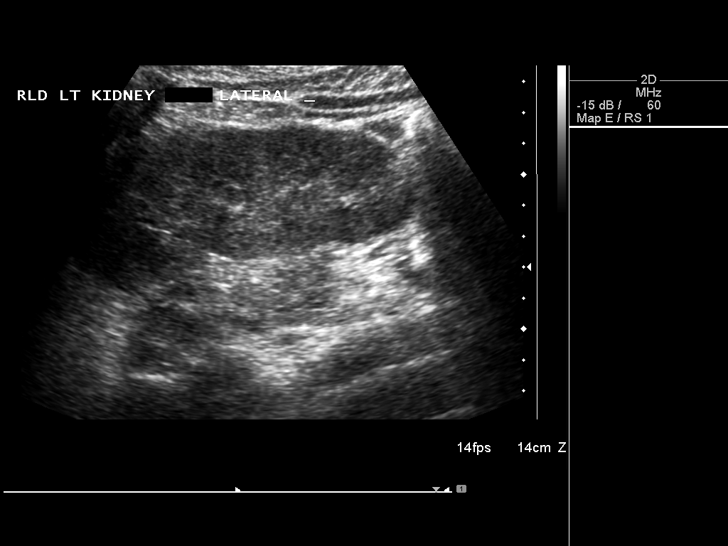
[im 59/65]
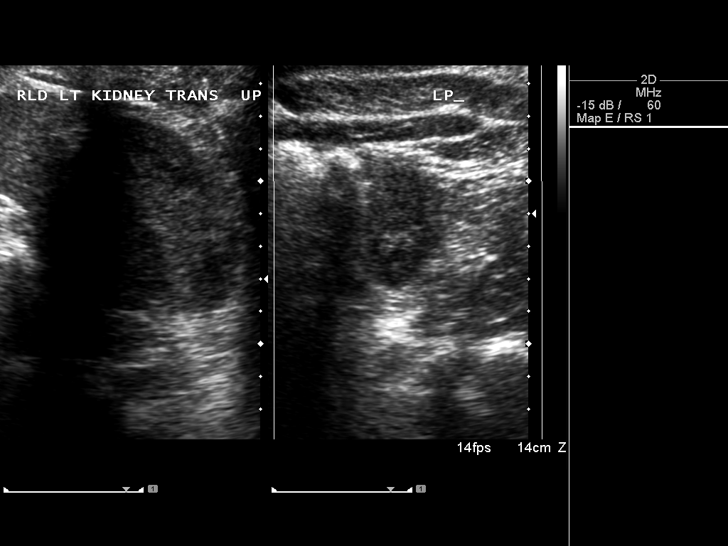
[im 65/65]
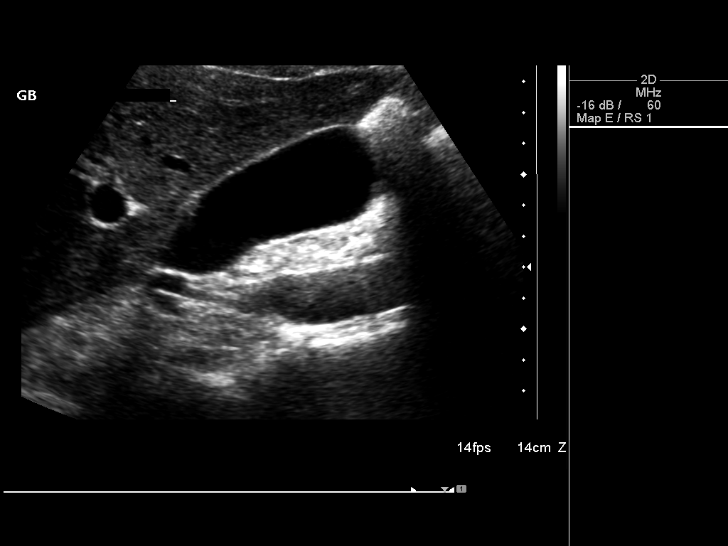

[14 of 25 positions shown; findings below may reference images not displayed]

FINDINGS: Gallbladder:  The gallbladder is visualized and no gallstones are
noted.

Common bile duct:  The common bile duct is normal measuring 2.6 mm
in diameter.

Liver:  The liver has a normal echogenic pattern.  No ductal
dilatation is seen.

IVC:  Appears normal.

Pancreas:  No focal abnormality seen.

Spleen:  The spleen is normal measuring 10.0 cm sagittally.

Right Kidney:  No hydronephrosis is seen.  The right kidney
measures 11.2 cm sagittally.

Left Kidney:  No hydronephrosis.  The left kidney measures 10.7 cm.

Abdominal aorta:  The abdominal aorta is normal in caliber.
IMPRESSION: Negative abdominal ultrasound.

## 2012-12-13 ENCOUNTER — Encounter: Payer: Self-pay | Admitting: Physician Assistant

## 2014-04-12 ENCOUNTER — Encounter (HOSPITAL_COMMUNITY): Payer: Self-pay | Admitting: Emergency Medicine

## 2014-04-12 ENCOUNTER — Emergency Department (HOSPITAL_COMMUNITY)
Admission: EM | Admit: 2014-04-12 | Discharge: 2014-04-12 | Disposition: A | Discharge status: Home / Self Care | Payer: Medicaid Other | Attending: Emergency Medicine | Admitting: Emergency Medicine

## 2014-04-12 DIAGNOSIS — M62830 Muscle spasm of back: Secondary | ICD-10-CM | POA: Insufficient documentation

## 2014-04-12 DIAGNOSIS — Z79899 Other long term (current) drug therapy: Secondary | ICD-10-CM | POA: Insufficient documentation

## 2014-04-12 DIAGNOSIS — K219 Gastro-esophageal reflux disease without esophagitis: Secondary | ICD-10-CM | POA: Diagnosis not present

## 2014-04-12 DIAGNOSIS — Z720 Tobacco use: Secondary | ICD-10-CM | POA: Insufficient documentation

## 2014-04-12 DIAGNOSIS — M545 Low back pain: Secondary | ICD-10-CM | POA: Diagnosis present

## 2014-04-12 DIAGNOSIS — Z8659 Personal history of other mental and behavioral disorders: Secondary | ICD-10-CM | POA: Diagnosis not present

## 2014-04-12 MED ORDER — METHOCARBAMOL 500 MG PO TABS: 500.0000 mg | ORAL_TABLET | Freq: Two times a day (BID) | ORAL | Status: DC

## 2014-04-12 MED ORDER — CYCLOBENZAPRINE HCL 10 MG PO TABS
10.0000 mg | ORAL_TABLET | Freq: Once | ORAL | Status: AC
  Administered 2014-04-12: 10 mg via ORAL
  Filled 2014-04-12: qty 1

## 2014-04-12 MED ORDER — TRAMADOL HCL 50 MG PO TABS: 50.0000 mg | ORAL_TABLET | Freq: Four times a day (QID) | ORAL | Status: DC | PRN

## 2014-04-12 MED ORDER — TRAMADOL HCL 50 MG PO TABS
50.0000 mg | ORAL_TABLET | Freq: Once | ORAL | Status: AC
  Administered 2014-04-12: 50 mg via ORAL
  Filled 2014-04-12: qty 1

## 2014-04-12 NOTE — ED Provider Notes (Signed)
CSN: [id number]     Arrival date & time 04/12/14  0113 History   First MD Initiated Contact with Patient 04/12/14 0150     Chief Complaint  Patient presents with  . Back Pain     (Consider location/radiation/quality/duration/timing/severity/associated sxs/prior Treatment) HPI Comments: Patient is a 23 year old male who presents with sudden onset of lower back pain that started earlier this evening. The pain is sharp and severe and does not radiate. The pain is constant. Movement makes the pain worse. Nothing makes the pain better. Patient reports feeling a "knot" in his left lower back. Patient has not tried anything for pain. No associated symptoms. No saddle paresthesias or bladder/bowel incontinence.      Past Medical History  Diagnosis Date  . Anxiety   . GERD (gastroesophageal reflux disease)   . ADD (attention deficit disorder)   . 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/2012   Past Surgical History  Procedure Laterality Date  . Finger surgery    . Esophagogastroduodenoscopy N/A 10/18/2012    Procedure: ESOPHAGOGASTRODUODENOSCOPY (EGD);  Surgeon: Louis Meckelobert D Kaplan, MD;  [facility];  Service: Endoscopy;  Laterality: N/A;  . Esophagogastroduodenoscopy (egd) with propofol N/A 10/18/2012    Procedure: ESOPHAGOGASTRODUODENOSCOPY (EGD) WITH PROPOFOL;  Surgeon: Louis Meckelobert D Kaplan, MD;  Location: [facility];  Service: Endoscopy;  Laterality: N/A;   History reviewed. No pertinent family history. History  Substance Use Topics  . Smoking status: Current Every Day Smoker -- 0.50 packs/day    Types: Cigarettes  . Smokeless tobacco: Never Used  . Alcohol Use: Yes     Comment: occ    Review of Systems  Constitutional: Negative for fever, chills and fatigue.  HENT: Negative for trouble swallowing.   Eyes: Negative for visual disturbance.  Respiratory: Negative for shortness of breath.   Cardiovascular: Negative for chest pain and palpitations.  Gastrointestinal: Negative for nausea,  vomiting, abdominal pain and diarrhea.  Genitourinary: Negative for dysuria and difficulty urinating.  Musculoskeletal: Positive for back pain. Negative for arthralgias and neck pain.  Skin: Negative for color change.  Neurological: Negative for dizziness and weakness.  Psychiatric/Behavioral: Negative for dysphoric mood.      Allergies  Lactose intolerance (gi)  Home Medications   Prior to Admission medications   Medication Sig Start Date End Date Taking? Authorizing Provider  esomeprazole (NEXIUM) 20 MG capsule Take 40 mg by mouth daily at 12 noon.   Yes Historical Provider, MD   BP 129/74  Pulse 82  Temp(Src) 98.4 F (36.9 C) (Oral)  Resp 18  SpO2 97% Physical Exam  Nursing note and vitals reviewed. Constitutional: He is oriented to person, place, and time. He appears well-developed and well-nourished. No distress.  HENT:  Head: Normocephalic and atraumatic.  Eyes: Conjunctivae and EOM are normal.  Neck: Normal range of motion.  Cardiovascular: Normal rate and regular rhythm.  Exam reveals no gallop and no friction rub.   No murmur heard. Pulmonary/Chest: Effort normal and breath sounds normal. He has no wheezes. He has no rales. He exhibits no tenderness.  Abdominal: Soft. There is no tenderness.  Musculoskeletal: Normal range of motion.  No midline spine tenderness to palpation. Left lumbar paraspinal tenderness to palpation.   Neurological: He is alert and oriented to person, place, and time. Coordination normal.  Patient ambulates without difficulty. Speech is goal-oriented. Moves limbs without ataxia. Lower extremity strength and sensation equal and intact bilaterally.   Skin: Skin is warm and dry.  Psychiatric: He has a normal mood and affect.  His behavior is normal.    ED Course  Procedures (including critical care time) Labs Review Labs Reviewed - No data to display  Imaging Review No results found.   EKG Interpretation None      MDM   Final  diagnoses:  Lumbar paraspinal muscle spasm    2:54 AM Patient has a muscle tension of left lumbar paraspinal area. No injury. Patient instructed to apply heat and take tramadol and flexeril. No bladder/bowel incontinence or saddle paresthesias.    Emilia BeckKaitlyn Lindora Alviar, PA-C 04/12/14 [phone]

## 2014-04-12 NOTE — Discharge Instructions (Signed)
Take Tramadol as needed for pain. Take robaxin as needed for muscle spasm. You may take these medications together. Apply heat to the affected area. Refer to attached documents for more information.

## 2014-04-12 NOTE — ED Notes (Signed)
PT c/o "knot" that developed to left lower back earlier today; pt states that the discomfort is intermittent and denies pain currently; no obvious swelling or "knot" noted to left lower back; palpated back without tenderness noted; pt able to move back and all extremities without difficulty; pt's father reports "swelling" was worse at the house and that's why I brought him here

## 2014-04-12 NOTE — ED Notes (Signed)
Pt arrived to the ED with a complaint of left lower back pain.  Pt has a knot on the left lower side.  Pt denies any event that could cause said.  Pt was in the shower when he felt a sharp pain.  Pt has experienced pain for 5 hours

## 2014-04-13 NOTE — ED Provider Notes (Signed)
Medical screening examination/treatment/procedure(s) were performed by non-physician practitioner and as supervising physician I was immediately available for consultation/collaboration.   Jerae Izard, MD 04/13/14 0050 

## 2015-07-13 ENCOUNTER — Emergency Department (HOSPITAL_COMMUNITY): Payer: No Typology Code available for payment source

## 2015-07-13 ENCOUNTER — Encounter (HOSPITAL_COMMUNITY): Payer: Self-pay

## 2015-07-13 ENCOUNTER — Emergency Department (HOSPITAL_COMMUNITY)
Admission: EM | Admit: 2015-07-13 | Discharge: 2015-07-13 | Disposition: A | Discharge status: Home / Self Care | Payer: No Typology Code available for payment source | Attending: Emergency Medicine | Admitting: Emergency Medicine

## 2015-07-13 DIAGNOSIS — F419 Anxiety disorder, unspecified: Secondary | ICD-10-CM | POA: Diagnosis not present

## 2015-07-13 DIAGNOSIS — N5089 Other specified disorders of the male genital organs: Secondary | ICD-10-CM | POA: Diagnosis present

## 2015-07-13 DIAGNOSIS — F17210 Nicotine dependence, cigarettes, uncomplicated: Secondary | ICD-10-CM | POA: Insufficient documentation

## 2015-07-13 DIAGNOSIS — N50819 Testicular pain, unspecified: Secondary | ICD-10-CM

## 2015-07-13 DIAGNOSIS — N451 Epididymitis: Secondary | ICD-10-CM | POA: Diagnosis not present

## 2015-07-13 DIAGNOSIS — Z79899 Other long term (current) drug therapy: Secondary | ICD-10-CM | POA: Diagnosis not present

## 2015-07-13 DIAGNOSIS — K219 Gastro-esophageal reflux disease without esophagitis: Secondary | ICD-10-CM | POA: Insufficient documentation

## 2015-07-13 MED ORDER — CIPROFLOXACIN HCL 500 MG PO TABS: 500.0000 mg | ORAL_TABLET | Freq: Two times a day (BID) | ORAL | Status: DC

## 2015-07-13 NOTE — ED Notes (Signed)
Pt reports his left testicle is red and significantly swollen and lifted up higher than his right testicle. It has been painful for about a week and the swelling started 3 days ago.

## 2015-07-13 NOTE — ED Provider Notes (Signed)
CSN: [id number]     Arrival date & time 07/13/15  0236 History  By signing my name below, I, Terrance Branch, attest that this documentation has been prepared under the direction and in the presence of Geoffery Lyons, MD. Electronically Signed: Evon Slack, ED Scribe. 07/13/2015. 3:10 AM.    Chief Complaint  Patient presents with  . Groin Swelling   The history is provided by the patient. No language interpreter was used.   HPI Comments: Erik Santana is a 25 y.o. male who presents to the Emergency Department complaining of new left testicle swelling onset 1 week prior. Pt states that area is also tender. Pt doesn't report any alleviating or worsening factors. Pt doesn't report any medications PTA. Pt denies any complications with the right testicle. Pt denies difficulty urinating, hematuria or other related symptoms.    Past Medical History  Diagnosis Date  . Anxiety   . GERD (gastroesophageal reflux disease)   . ADD (attention deficit disorder)   . 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/2012   Past Surgical History  Procedure Laterality Date  . Finger surgery    . Esophagogastroduodenoscopy N/A 10/18/2012    Procedure: ESOPHAGOGASTRODUODENOSCOPY (EGD);  Surgeon: Louis Meckel, MD;  [facility];  Service: Endoscopy;  Laterality: N/A;  . Esophagogastroduodenoscopy (egd) with propofol N/A 10/18/2012    Procedure: ESOPHAGOGASTRODUODENOSCOPY (EGD) WITH PROPOFOL;  Surgeon: Louis Meckel, MD;  Location: [facility];  Service: Endoscopy;  Laterality: N/A;   No family history on file. Social History  Substance Use Topics  . Smoking status: Current Every Day Smoker -- 0.50 packs/day    Types: Cigarettes  . Smokeless tobacco: Never Used  . Alcohol Use: Yes     Comment: occ    Review of Systems  Genitourinary: Positive for scrotal swelling and testicular pain. Negative for hematuria and difficulty urinating.  All other systems reviewed and are negative.    Allergies  Lactose  intolerance (gi)  Home Medications   Prior to Admission medications   Medication Sig Start Date End Date Taking? Authorizing Provider  esomeprazole (NEXIUM) 20 MG capsule Take 40 mg by mouth daily at 12 noon.    Historical Provider, MD  methocarbamol (ROBAXIN) 500 MG tablet Take 1 tablet (500 mg total) by mouth 2 (two) times daily. 04/12/14   Kaitlyn Szekalski, PA-C  traMADol (ULTRAM) 50 MG tablet Take 1 tablet (50 mg total) by mouth every 6 (six) hours as needed. 04/12/14   Kaitlyn Szekalski, PA-C    BP 148/94 mmHg  Pulse 114  Temp(Src) 97.4 F (36.3 C) (Oral)  Resp 16  Ht 6\' 3"  (1.905 m)  Wt 190 lb (86.183 kg)  BMI 23.75 kg/m2  SpO2 100%    Physical Exam  Constitutional: He is oriented to person, place, and time. He appears well-developed and well-nourished.  HENT:  Head: Normocephalic and atraumatic.  Eyes: EOM are normal.  Neck: Normal range of motion.  Cardiovascular: Normal rate, regular rhythm, normal heart sounds and intact distal pulses.   Pulmonary/Chest: Effort normal and breath sounds normal. No respiratory distress.  Abdominal: Soft. He exhibits no distension. There is no tenderness.  Genitourinary:  Left testicle is TTP. Freely mobile within scrotum. No masses palpable. External genitalia appear otherwise unremarkable.   Musculoskeletal: Normal range of motion.  Neurological: He is alert and oriented to person, place, and time.  Skin: Skin is warm and dry.  Psychiatric: He has a normal mood and affect. Judgment normal.  Nursing note and vitals reviewed.   ED  Course  Procedures (including critical care time) DIAGNOSTIC STUDIES: Oxygen Saturation is 100% on RA, normal by my interpretation.    COORDINATION OF CARE: 3:09 AM-Discussed treatment plan with pt at bedside and pt agreed to plan.    Labs Review Labs Reviewed - No data to display  Imaging Review No results found.    EKG Interpretation None      MDM   Final diagnoses:  None    Ultrasound reveals good blood flow and no masses or lesions. There is the suggestion of inflammation over the epididymis. This fits the clinical picture. He will be treated with Cipro and when necessary return.  I personally performed the services described in this documentation, which was scribed in my presence. The recorded information has been reviewed and is accurate.         Geoffery Lyonsouglas Taylar Hartsough, MD 07/14/15 [phone]

## 2015-07-13 NOTE — Discharge Instructions (Signed)
Cipro as prescribed.  Ibuprofen 600 mg every 6 hours as needed for pain.  Return to the ER if symptoms significantly worsen or change.   Epididymitis Epididymitis is swelling (inflammation) of the epididymis. The epididymis is a cord-like structure that is located along the top and back part of the testicle. It collects and stores sperm from the testicle. This condition can also cause pain and swelling of the testicle and scrotum. Symptoms usually start suddenly (acute epididymitis). Sometimes epididymitis starts gradually and lasts for a while (chronic epididymitis). This type may be harder to treat. CAUSES In men 6435 and younger, this condition is usually caused by a bacterial infection or sexually transmitted disease (STD), such as:  Gonorrhea.  Chlamydia.  In men 4035 and older who do not have anal sex, this condition is usually caused by bacteria from a blockage or abnormalities in the urinary system. These can result from:  Having a tube placed into the bladder (urinary catheter).  Having an enlarged or inflamed prostate gland.  Having recent urinary tract surgery. In men who have a condition that weakens the body's defense system (immune system), such as HIV, this condition can be caused by:   Other bacteria, including tuberculosis and syphilis.  Viruses.  Fungi. Sometimes this condition occurs without infection. That may happen if urine flows backward into the epididymis after heavy lifting or straining. RISK FACTORS This condition is more likely to develop in men:  Who have unprotected sex with more than one partner.  Who have anal sex.   Who have recently had surgery.   Who have a urinary catheter.  Who have urinary problems.  Who have a suppressed immune system. SYMPTOMS  This condition usually begins suddenly with chills, fever, and pain behind the scrotum and in the testicle. Other symptoms include:   Swelling of the scrotum, testicle, or both.  Pain  whenejaculatingor urinating.  Pain in the back or belly.  Nausea.  Itching and discharge from the penis.  Frequent need to pass urine.  Redness and tenderness of the scrotum. DIAGNOSIS Your health care provider can diagnose this condition based on your symptoms and medical history. Your health care provider will also do a physical exam to ask about your symptoms and check your scrotum and testicle for swelling, pain, and redness. You may also have other tests, including:   Examination of discharge from the penis.  Urine tests for infections, such as STDs.  Your health care provider may test you for other STDs, including HIV. TREATMENT Treatment for this condition depends on the cause. If your condition is caused by a bacterial infection, oral antibiotic medicine may be prescribed. If the bacterial infection has spread to your blood, you may need to receive IV antibiotics. Nonbacterial epididymitis is treated with home care that includes bed rest and elevation of the scrotum. Surgery may be needed to treat:  Bacterial epididymitis that causes pus to build up in the scrotum (abscess).  Chronic epididymitis that has not responded to other treatments. HOME CARE INSTRUCTIONS Medicines  Take over-the-counter and prescription medicines only as told by your health care provider.   If you were prescribed an antibiotic medicine, take it as told by your health care provider. Do not stop taking the antibiotic even if your condition improves. Sexual Activity  If your epididymitis was caused by an STD, avoid sexual activity until your treatment is complete.  Inform your sexual partner or partners if you test positive for an STD. They may need to  be treated.Do not engage in sexual activity with your partner or partners until their treatment is completed. General Instructions  Return to your normal activities as told by your health care provider. Ask your health care provider what  activities are safe for you.  Keep your scrotum elevated and supported while resting. Ask your health care provider if you should wear a scrotal support, such as a jockstrap. Wear it as told by your health care provider.  If directed, apply ice to the affected area:   Put ice in a plastic bag.  Place a towel between your skin and the bag.  Leave the ice on for 20 minutes, 2-3 times per day.  Try taking a sitz bath to help with discomfort. This is a warm water bath that is taken while you are sitting down. The water should only come up to your hips and should cover your buttocks. Do this 3-4 times per day or as told by your health care provider.  Keep all follow-up visits as told by your health care provider. This is important. SEEK MEDICAL CARE IF:   You have a fever.   Your pain medicine is not helping.   Your pain is getting worse.   Your symptoms do not improve within three days.   This information is not intended to replace advice given to you by your health care provider. Make sure you discuss any questions you have with your health care provider.   Document Released: 06/11/2000 Document Revised: 03/05/2015 Document Reviewed: 10/30/2014 Elsevier Interactive Patient Education [employer]

## 2015-09-20 ENCOUNTER — Emergency Department (INDEPENDENT_AMBULATORY_CARE_PROVIDER_SITE_OTHER)
Admission: EM | Admit: 2015-09-20 | Discharge: 2015-09-20 | Disposition: A | Discharge status: Home / Self Care | Payer: No Typology Code available for payment source | Source: Home / Self Care | Attending: Family Medicine | Admitting: Family Medicine

## 2015-09-20 ENCOUNTER — Encounter (HOSPITAL_COMMUNITY): Payer: Self-pay | Admitting: Emergency Medicine

## 2015-09-20 DIAGNOSIS — N451 Epididymitis: Secondary | ICD-10-CM | POA: Diagnosis not present

## 2015-09-20 LAB — POCT URINALYSIS DIP (DEVICE)
BILIRUBIN URINE: NEGATIVE
Glucose, UA: NEGATIVE mg/dL
HGB URINE DIPSTICK: NEGATIVE
Ketones, ur: NEGATIVE mg/dL
Leukocytes, UA: NEGATIVE
Nitrite: NEGATIVE
Protein, ur: NEGATIVE mg/dL
Specific Gravity, Urine: 1.015 (ref 1.005–1.030)
UROBILINOGEN UA: 0.2 mg/dL (ref 0.0–1.0)
pH: 7 (ref 5.0–8.0)

## 2015-09-20 MED ORDER — DOXYCYCLINE HYCLATE 100 MG PO CAPS: 100.0000 mg | ORAL_CAPSULE | Freq: Two times a day (BID) | ORAL | Status: DC

## 2015-09-20 MED ORDER — ACETAMINOPHEN 325 MG PO TABS
325.0000 mg | ORAL_TABLET | Freq: Once | ORAL | Status: AC
  Administered 2015-09-20: 325 mg via ORAL

## 2015-09-20 NOTE — ED Notes (Signed)
Pt c/o groin pain onset x2 months associated w/swelling of left testicle  Reports [facility] and was treated for Epididymis  C/o dysuria... A&O x4... No acute distress.

## 2015-09-20 NOTE — ED Provider Notes (Signed)
CSN: [id number]     Arrival date & time 09/20/15  1513 History   First MD Initiated Contact with Patient 09/20/15 1636     Chief Complaint  Patient presents with  . Groin Pain   (Consider location/radiation/quality/duration/timing/severity/associated sxs/prior Treatment) HPI Comments: 25 year old male was seen 2 months ago at the emergency department with a diagnosis of epididymitis. He presents today with a chief complaint of "something raw with my prostate". He gives me the instruction sheet with a diagnosis of epididymitis on it. When asked what his symptoms are his responses are basically I just do not feel right. He is unable to provide the words to describe his symptoms. When asked specific questions he affirms that he has swelling to the left testicle associated with pain, dysuria and urine hesitancy and intermittent strain. Denies fever. He states that this has been ongoing for 2 months. Later in the conversation he states that he got better after his visit to the emergency department and the symptoms have returned sometime between 3 and 6 weeks ago. When asked when he got worse or what the new symptoms were a lot reason he came in today he states that his nerves bothered him and forced him to come in.   Past Medical History  Diagnosis Date  . Anxiety   . GERD (gastroesophageal reflux disease)   . ADD (attention deficit disorder)   . 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/2012   Past Surgical History  Procedure Laterality Date  . Finger surgery    . Esophagogastroduodenoscopy N/A 10/18/2012    Procedure: ESOPHAGOGASTRODUODENOSCOPY (EGD);  Surgeon: Louis Meckelobert D Kaplan, MD;  [facility];  Service: Endoscopy;  Laterality: N/A;  . Esophagogastroduodenoscopy (egd) with propofol N/A 10/18/2012    Procedure: ESOPHAGOGASTRODUODENOSCOPY (EGD) WITH PROPOFOL;  Surgeon: Louis Meckelobert D Kaplan, MD;  Location: [facility];  Service: Endoscopy;  Laterality: N/A;   No family history on file. Social History   Substance Use Topics  . Smoking status: Current Every Day Smoker -- 0.50 packs/day    Types: Cigarettes  . Smokeless tobacco: Never Used  . Alcohol Use: Yes     Comment: occ    Review of Systems  Constitutional: Negative for fever, chills and activity change.  HENT: Negative.   Respiratory: Negative.   Cardiovascular: Negative.   Genitourinary: Positive for dysuria, scrotal swelling, difficulty urinating and testicular pain. Negative for urgency, frequency, discharge, penile swelling and penile pain.  Skin: Negative.   Neurological: Negative.     Allergies  Lactose intolerance (gi)  Home Medications   Prior to Admission medications   Medication Sig Start Date End Date Taking? Authorizing Provider  ciprofloxacin (CIPRO) 500 MG tablet Take 1 tablet (500 mg total) by mouth 2 (two) times daily. 07/13/15   Geoffery Lyonsouglas Delo, MD  doxycycline (VIBRAMYCIN) 100 MG capsule Take 1 capsule (100 mg total) by mouth 2 (two) times daily. 09/20/15   Hayden Rasmussenavid Orlando Thalmann, NP  ibuprofen (ADVIL,MOTRIN) 200 MG tablet Take 200 mg by mouth every 6 (six) hours as needed for moderate pain.    Historical Provider, MD   Meds Ordered and Administered this Visit  Medications - No data to display  There were no vitals taken for this visit. No data found.   Physical Exam  Constitutional: He is oriented to person, place, and time. He appears well-developed and well-nourished. No distress.  Eyes: EOM are normal.  Neck: Normal range of motion. Neck supple.  Cardiovascular: Normal rate.   Pulmonary/Chest: Effort normal. No respiratory distress.  Genitourinary: Rectum normal,  prostate normal and penis normal. No penile tenderness.  Normal phallus. There is no substantial swelling of the scrotum or the testicles. When asked where the swelling was the patient's points to the posterior left testicle to a small 2 by stretcher which represents the vas deferens. There is no cure swelling or enlargement of either testicle. There  is minor tenderness to the left epididymal apparatus. No abnormal intrascrotal contents. Bilateral testicles are vertical. No swelling or lesions to the scrotum. No regional lymphadenopathy.  Prostate exam reveals firm prostate of normal size. No bogginess. No nodules. No tenderness.  Musculoskeletal: He exhibits no edema.  Lymphadenopathy:    He has no cervical adenopathy.  Neurological: He is alert and oriented to person, place, and time. He exhibits normal muscle tone.  Skin: Skin is warm and dry.  Psychiatric: He has a normal mood and affect.  Nursing note and vitals reviewed.   ED Course  Procedures (including critical care time)  Labs Review Labs Reviewed  POCT URINALYSIS DIP (DEVICE)    Imaging Review No results found. Results for orders placed or performed during the hospital encounter of 09/20/15  POCT urinalysis dip (device)  Result Value Ref Range   Glucose, UA NEGATIVE NEGATIVE mg/dL   Bilirubin Urine NEGATIVE NEGATIVE   Ketones, ur NEGATIVE NEGATIVE mg/dL   Specific Gravity, Urine 1.015 1.005 - 1.030   Hgb urine dipstick NEGATIVE NEGATIVE   pH 7.0 5.0 - 8.0   Protein, ur NEGATIVE NEGATIVE mg/dL   Urobilinogen, UA 0.2 0.0 - 1.0 mg/dL   Nitrite NEGATIVE NEGATIVE   Leukocytes, UA NEGATIVE NEGATIVE     Visual Acuity Review  Right Eye Distance:   Left Eye Distance:   Bilateral Distance:    Right Eye Near:   Left Eye Near:    Bilateral Near:         MDM   1. Epididymitis, left    Meds ordered this encounter  Medications  . doxycycline (VIBRAMYCIN) 100 MG capsule    Sig: Take 1 capsule (100 mg total) by mouth 2 (two) times daily.    Dispense:  20 capsule    Refill:  0    Order Specific Question:  Supervising Provider    Answer:  Linna Hoff [phone]   Follow with urology on p.1, call for appt.    Hayden Rasmussen, NP 09/20/15 1801

## 2015-09-20 NOTE — Discharge Instructions (Signed)
Epididymitis °Epididymitis is swelling (inflammation) of the epididymis. The epididymis is a cord-like structure that is located along the top and back part of the testicle. It collects and stores sperm from the testicle. °This condition can also cause pain and swelling of the testicle and scrotum. Symptoms usually start suddenly (acute epididymitis). Sometimes epididymitis starts gradually and lasts for a while (chronic epididymitis). This type may be harder to treat. °CAUSES °In men 35 and younger, this condition is usually caused by a bacterial infection or sexually transmitted disease (STD), such as: °· Gonorrhea. °· Chlamydia.   °In men 35 and older who do not have anal sex, this condition is usually caused by bacteria from a blockage or abnormalities in the urinary system. These can result from: °· Having a tube placed into the bladder (urinary catheter). °· Having an enlarged or inflamed prostate gland. °· Having recent urinary tract surgery. °In men who have a condition that weakens the body's defense system (immune system), such as HIV, this condition can be caused by:  °· Other bacteria, including tuberculosis and syphilis. °· Viruses. °· Fungi. °Sometimes this condition occurs without infection. That may happen if urine flows backward into the epididymis after heavy lifting or straining. °RISK FACTORS °This condition is more likely to develop in men: °· Who have unprotected sex with more than one partner. °· Who have anal sex.   °· Who have recently had surgery.   °· Who have a urinary catheter. °· Who have urinary problems. °· Who have a suppressed immune system.   °SYMPTOMS  °This condition usually begins suddenly with chills, fever, and pain behind the scrotum and in the testicle. Other symptoms include:  °· Swelling of the scrotum, testicle, or both. °· Pain when ejaculating or urinating. °· Pain in the back or belly. °· Nausea. °· Itching and discharge from the penis. °· Frequent need to pass  urine. °· Redness and tenderness of the scrotum. °DIAGNOSIS °Your health care provider can diagnose this condition based on your symptoms and medical history. Your health care provider will also do a physical exam to ask about your symptoms and check your scrotum and testicle for swelling, pain, and redness. You may also have other tests, including:   °· Examination of discharge from the penis. °· Urine tests for infections, such as STDs.   °Your health care provider may test you for other STDs, including HIV.  °TREATMENT °Treatment for this condition depends on the cause. If your condition is caused by a bacterial infection, oral antibiotic medicine may be prescribed. If the bacterial infection has spread to your blood, you may need to receive IV antibiotics. Nonbacterial epididymitis is treated with home care that includes bed rest and elevation of the scrotum. °Surgery may be needed to treat: °· Bacterial epididymitis that causes pus to build up in the scrotum (abscess). °· Chronic epididymitis that has not responded to other treatments. °HOME CARE INSTRUCTIONS °Medicines  °· Take over-the-counter and prescription medicines only as told by your health care provider.   °· If you were prescribed an antibiotic medicine, take it as told by your health care provider. Do not stop taking the antibiotic even if your condition improves. °Sexual Activity  °· If your epididymitis was caused by an STD, avoid sexual activity until your treatment is complete. °· Inform your sexual partner or partners if you test positive for an STD. They may need to be treated. Do not engage in sexual activity with your partner or partners until their treatment is completed. °General Instructions  °· Return to your normal activities as told   by your health care provider. Ask your health care provider what activities are safe for you. °· Keep your scrotum elevated and supported while resting. Ask your health care provider if you should wear a  scrotal support, such as a jockstrap. Wear it as told by your health care provider. °· If directed, apply ice to the affected area:   °¨ Put ice in a plastic bag. °¨ Place a towel between your skin and the bag. °¨ Leave the ice on for 20 minutes, 2-3 times per day. °· Try taking a sitz bath to help with discomfort. This is a warm water bath that is taken while you are sitting down. The water should only come up to your hips and should cover your buttocks. Do this 3-4 times per day or as told by your health care provider. °· Keep all follow-up visits as told by your health care provider. This is important. °SEEK MEDICAL CARE IF:  °· You have a fever.   °· Your pain medicine is not helping.   °· Your pain is getting worse.   °· Your symptoms do not improve within three days. °  °This information is not intended to replace advice given to you by your health care provider. Make sure you discuss any questions you have with your health care provider. °  °Document Released: 06/11/2000 Document Revised: 03/05/2015 Document Reviewed: 10/30/2014 °Elsevier Interactive Patient Education ©2016 [employer] ° °

## 2016-10-10 IMAGING — US US SCROTUM
1 series · 14 of 25 positions shown · non-contrast
Comparison: None.

CLINICAL DATA: 24-year-old male with left testicular pain,
swelling, and redness.

EXAM:
SCROTAL ULTRASOUND
DOPPLER ULTRASOUND OF THE TESTICLES
TECHNIQUE: Complete ultrasound examination of the testicles, epididymis, and
other scrotal structures was performed. Color and spectral Doppler
ultrasound were also utilized to evaluate blood flow to the
testicles.

[Series 1: us scrotum · 0.07mm/px · 14 of 66 slices shown]
[im 1/66]
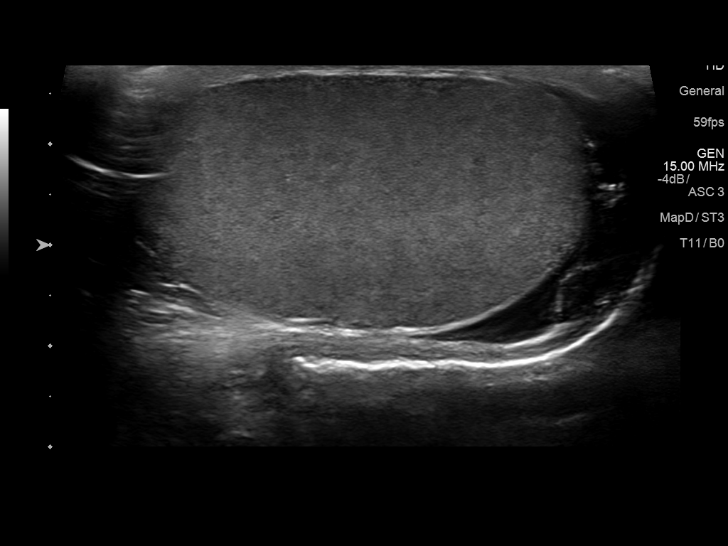
[im 6/66]
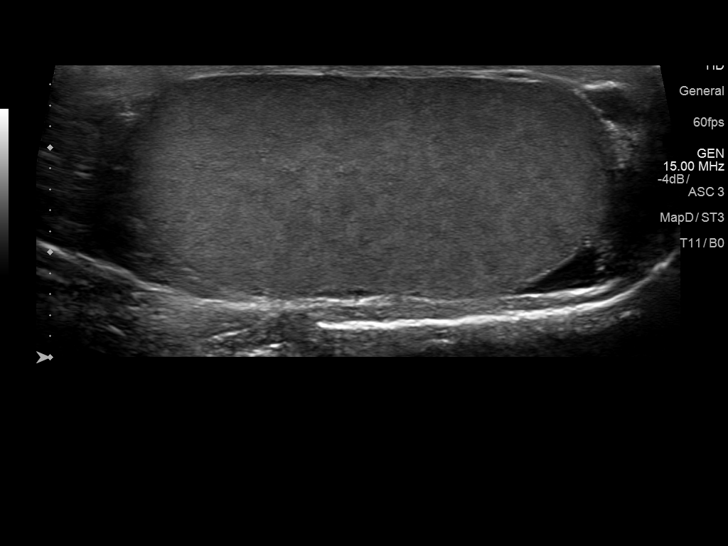
[im 11/66]
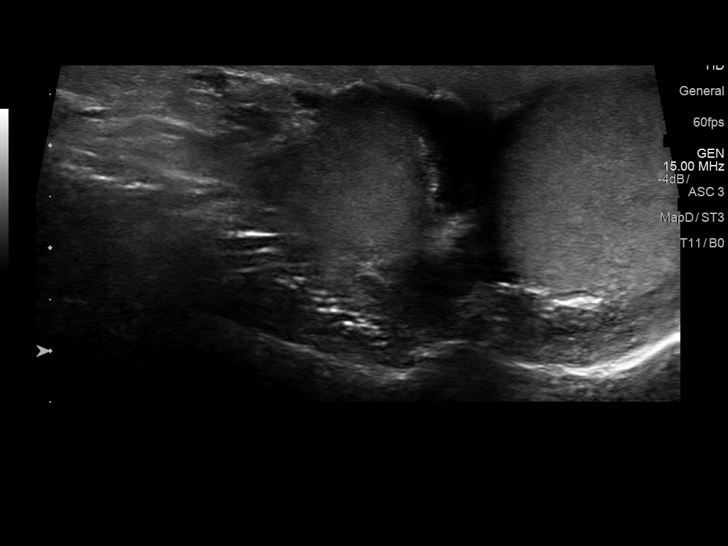
[im 17/66]
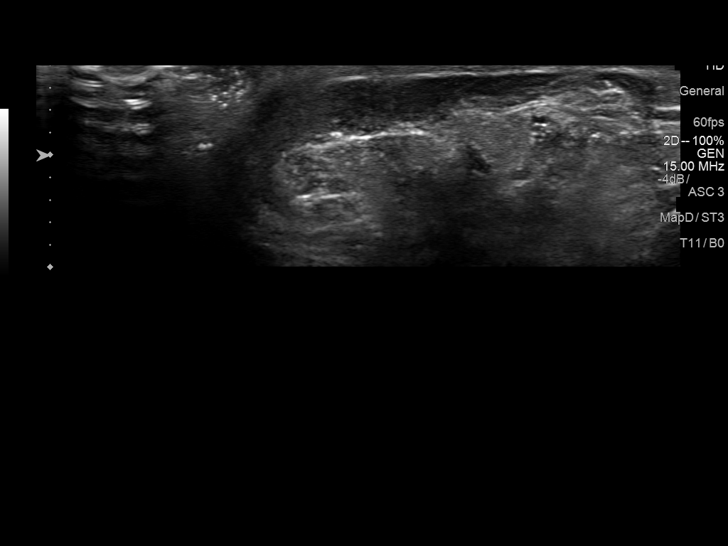
[im 22/66]
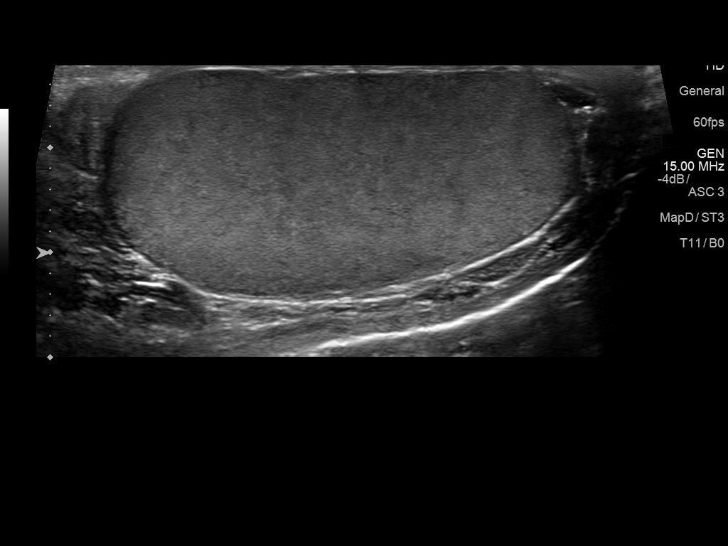
[im 25/66]
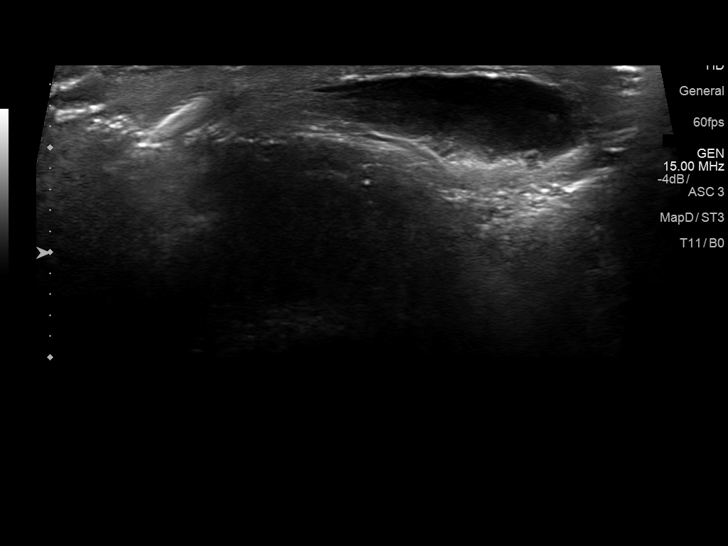
[im 30/66]
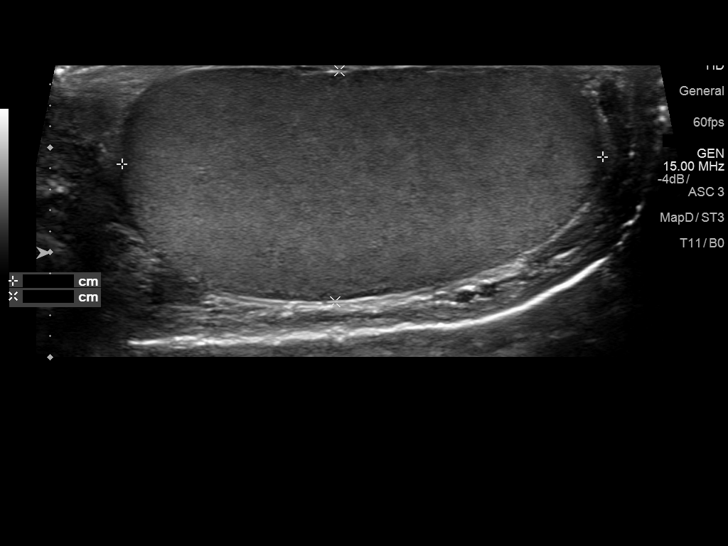
[im 36/66]
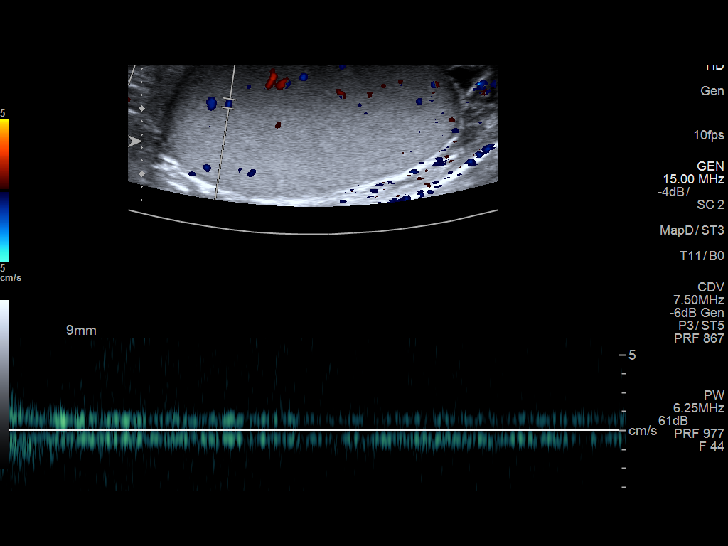
[im 41/66]
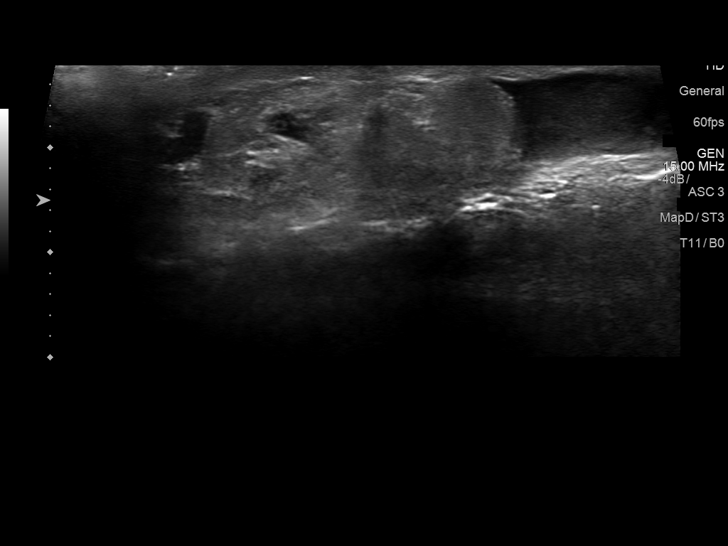
[im 44/66]
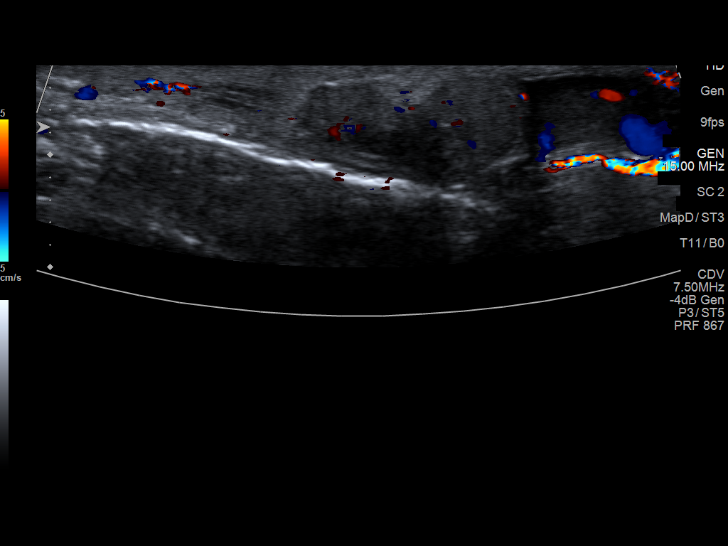
[im 49/66]
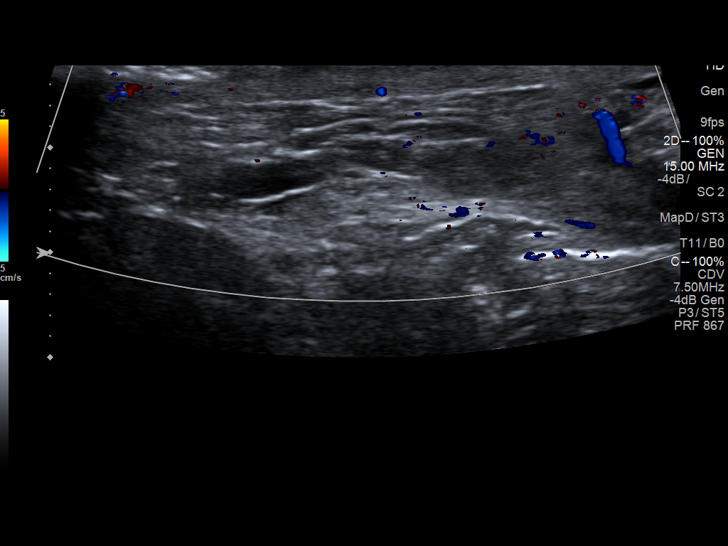
[im 55/66]
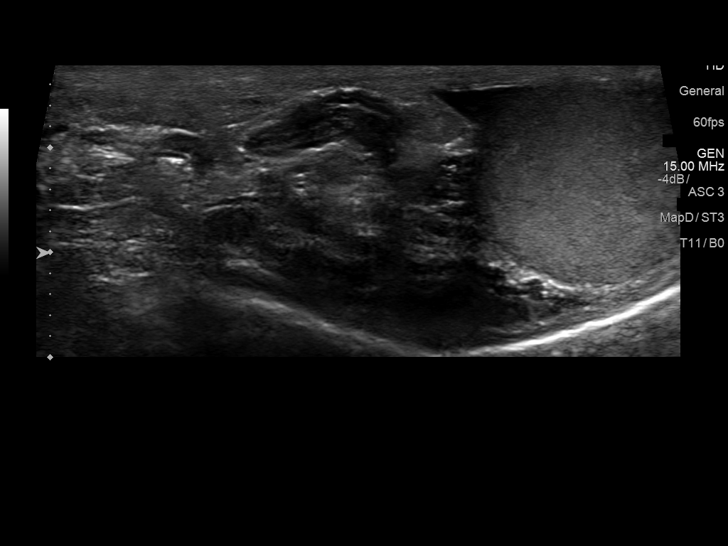
[im 60/66]
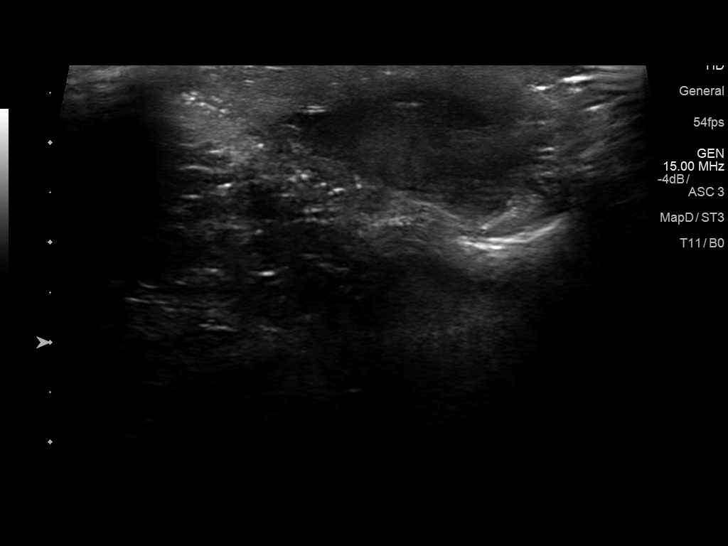
[im 66/66]
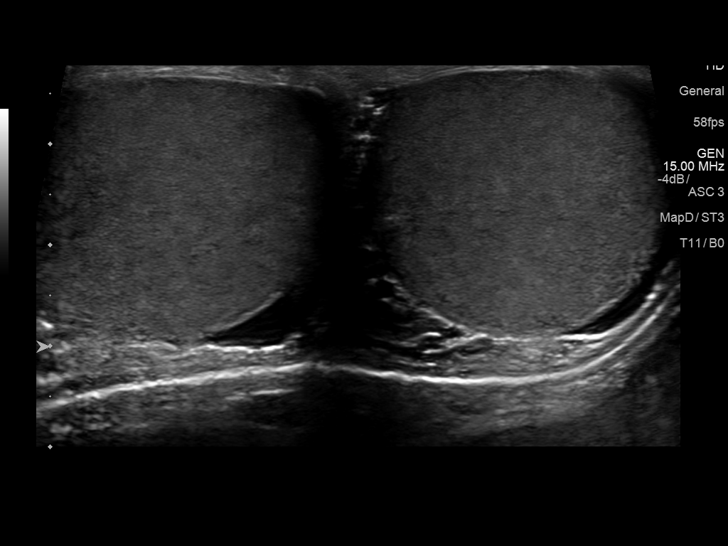

[14 of 25 positions shown; findings below may reference images not displayed]

FINDINGS: Right testicle

Measurements: 4.6 x 2.2 x 3.1 cm. No mass or microlithiasis
visualized.

Left testicle

Measurements: 4.6 x 2.2 x 3.0 cm. No mass or microlithiasis
visualized.

Right epididymis:  Normal in size and appearance.

Left epididymis: There is mild prominence of the left epididymal
head.

Hydrocele:  Small bilateral hydroceles.

Varicocele: Minimally dilated venous plexus in the left scrotum on
Valsalva.

Pulsed Doppler interrogation of both testes demonstrates normal low
resistance arterial and venous waveforms bilaterally.
IMPRESSION: Unremarkable testicles.

Slight prominence of the left epididymal head. Clinical correlation
is recommended to evaluate for epididymitis.

Mild left varicocele.

## 2020-02-05 ENCOUNTER — Ambulatory Visit (HOSPITAL_COMMUNITY)
Admission: EM | Admit: 2020-02-05 | Discharge: 2020-02-05 | Disposition: A | Discharge status: Home / Self Care | Payer: No Typology Code available for payment source | Attending: Physician Assistant | Admitting: Physician Assistant

## 2020-02-05 ENCOUNTER — Other Ambulatory Visit: Payer: Self-pay

## 2020-02-05 ENCOUNTER — Encounter (HOSPITAL_COMMUNITY): Payer: Self-pay | Admitting: Emergency Medicine

## 2020-02-05 DIAGNOSIS — G8929 Other chronic pain: Secondary | ICD-10-CM | POA: Diagnosis not present

## 2020-02-05 DIAGNOSIS — M545 Low back pain, unspecified: Secondary | ICD-10-CM

## 2020-02-05 DIAGNOSIS — M62830 Muscle spasm of back: Secondary | ICD-10-CM

## 2020-02-05 MED ORDER — OMEPRAZOLE 20 MG PO CPDR: 20.0000 mg | DELAYED_RELEASE_CAPSULE | Freq: Every day | ORAL | 0 refills | Status: DC

## 2020-02-05 MED ORDER — MELOXICAM 15 MG PO TABS: 15.0000 mg | ORAL_TABLET | Freq: Every day | ORAL | 0 refills | Status: DC

## 2020-02-05 MED ORDER — TIZANIDINE HCL 4 MG PO TABS: 4.0000 mg | ORAL_TABLET | Freq: Every day | ORAL | 0 refills | Status: AC
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
## 2020-02-05 NOTE — Discharge Instructions (Signed)
Take the Mobic daily Take the Prilosec while taking Mobic Take the muscle relaxer tizanidine/Zanaflex at night.  This will make you sleepy do not drive, drink alcohol or operate machinery within 8 hours of taking  Follow up with the sports medicine group supplied.  If you were to develop sudden onset of numbness, tingling weakness, loss of bowel or bladder control go to the emergency department

## 2022-06-06 ENCOUNTER — Emergency Department (HOSPITAL_COMMUNITY)
Admission: EM | Admit: 2022-06-06 | Discharge: 2022-06-06 | Discharge status: Against Medical Advice | Payer: No Typology Code available for payment source | Attending: Emergency Medicine | Admitting: Emergency Medicine

## 2022-06-06 ENCOUNTER — Other Ambulatory Visit: Payer: Self-pay

## 2022-06-06 ENCOUNTER — Encounter (HOSPITAL_COMMUNITY): Payer: Self-pay | Admitting: *Deleted

## 2022-06-06 DIAGNOSIS — R590 Localized enlarged lymph nodes: Secondary | ICD-10-CM | POA: Diagnosis present

## 2022-06-06 DIAGNOSIS — H9203 Otalgia, bilateral: Secondary | ICD-10-CM | POA: Diagnosis not present

## 2022-06-06 DIAGNOSIS — Z5321 Procedure and treatment not carried out due to patient leaving prior to being seen by health care provider: Secondary | ICD-10-CM | POA: Insufficient documentation

## 2022-06-06 DIAGNOSIS — J020 Streptococcal pharyngitis: Secondary | ICD-10-CM | POA: Insufficient documentation

## 2022-06-06 LAB — GROUP A STREP BY PCR: Group A Strep by PCR: DETECTED — AB

## 2022-06-06 NOTE — ED Triage Notes (Addendum)
Pt with c/o lymph nodes swelling in his neck, sore throat and bilateral ear pain, right worse than left. Unknown of fevers. Tonsils red and  swollen

## 2022-09-18 ENCOUNTER — Emergency Department (HOSPITAL_BASED_OUTPATIENT_CLINIC_OR_DEPARTMENT_OTHER): Payer: No Typology Code available for payment source

## 2022-09-18 ENCOUNTER — Emergency Department (HOSPITAL_BASED_OUTPATIENT_CLINIC_OR_DEPARTMENT_OTHER)
Admission: EM | Admit: 2022-09-18 | Discharge: 2022-09-18 | Disposition: A | Discharge status: Home / Self Care | Payer: Self-pay | Attending: Emergency Medicine | Admitting: Emergency Medicine

## 2022-09-18 ENCOUNTER — Other Ambulatory Visit: Payer: Self-pay

## 2022-09-18 DIAGNOSIS — S62629A Displaced fracture of medial phalanx of unspecified finger, initial encounter for closed fracture: Secondary | ICD-10-CM

## 2022-09-18 DIAGNOSIS — D72829 Elevated white blood cell count, unspecified: Secondary | ICD-10-CM | POA: Insufficient documentation

## 2022-09-18 DIAGNOSIS — W2209XA Striking against other stationary object, initial encounter: Secondary | ICD-10-CM | POA: Insufficient documentation

## 2022-09-18 DIAGNOSIS — S62624A Displaced fracture of medial phalanx of right ring finger, initial encounter for closed fracture: Secondary | ICD-10-CM | POA: Insufficient documentation

## 2022-09-18 DIAGNOSIS — R300 Dysuria: Secondary | ICD-10-CM

## 2022-09-18 LAB — COMPREHENSIVE METABOLIC PANEL
ALT: 15 U/L (ref 0–44)
AST: 14 U/L — ABNORMAL LOW (ref 15–41)
Albumin: 4.2 g/dL (ref 3.5–5.0)
Alkaline Phosphatase: 53 U/L (ref 38–126)
Anion gap: 5 (ref 5–15)
BUN: 12 mg/dL (ref 6–20)
CO2: 31 mmol/L (ref 22–32)
Calcium: 9.9 mg/dL (ref 8.9–10.3)
Chloride: 101 mmol/L (ref 98–111)
Creatinine, Ser: 0.96 mg/dL (ref 0.61–1.24)
GFR, Estimated: >60 mL/min (ref >60)
Glucose, Bld: 87 mg/dL (ref 70–99)
Potassium: 3.8 mmol/L (ref 3.5–5.1)
Sodium: 137 mmol/L (ref 135–145)
Total Bilirubin: 0.3 mg/dL (ref 0.3–1.2)
Total Protein: 7 g/dL (ref 6.5–8.1)

## 2022-09-18 LAB — URINALYSIS, W/ REFLEX TO CULTURE (INFECTION SUSPECTED)
Bacteria, UA: NONE SEEN
Bilirubin Urine: NEGATIVE
Glucose, UA: NEGATIVE mg/dL
Hgb urine dipstick: NEGATIVE
Ketones, ur: NEGATIVE mg/dL
Leukocytes,Ua: NEGATIVE
Nitrite: NEGATIVE
Protein, ur: NEGATIVE mg/dL
Specific Gravity, Urine: 1.016 (ref 1.005–1.030)
pH: 6.5 (ref 5.0–8.0)

## 2022-09-18 LAB — CBC WITH DIFFERENTIAL/PLATELET
Abs Immature Granulocytes: 0.03 10*3/uL (ref 0.00–0.07)
Basophils Absolute: 0.1 10*3/uL (ref 0.0–0.1)
Basophils Relative: 1 %
Eosinophils Absolute: 0.1 10*3/uL (ref 0.0–0.5)
Eosinophils Relative: 1 %
HCT: 44 % (ref 39.0–52.0)
Hemoglobin: 15.4 g/dL (ref 13.0–17.0)
Immature Granulocytes: 0 %
Lymphocytes Relative: 22 %
Lymphs Abs: 2.4 10*3/uL (ref 0.7–4.0)
MCH: 29.4 pg (ref 26.0–34.0)
MCHC: 35 g/dL (ref 30.0–36.0)
MCV: 84.1 fL (ref 80.0–100.0)
Monocytes Absolute: 0.9 10*3/uL (ref 0.1–1.0)
Monocytes Relative: 8 %
Neutro Abs: 7.6 10*3/uL (ref 1.7–7.7)
Neutrophils Relative %: 68 %
Platelets: 333 10*3/uL (ref 150–400)
RBC: 5.23 MIL/uL (ref 4.22–5.81)
RDW: 12.5 % (ref 11.5–15.5)
WBC: 11.1 10*3/uL — ABNORMAL HIGH (ref 4.0–10.5)
nRBC: 0 % (ref 0.0–0.2)

## 2022-09-18 MED ORDER — OXYCODONE HCL 5 MG PO TABS: 5.0000 mg | ORAL_TABLET | ORAL | 0 refills | Status: DC | PRN

## 2022-09-18 MED ORDER — OXYCODONE HCL 5 MG PO TABS
5.0000 mg | ORAL_TABLET | ORAL | Status: AC
  Administered 2022-09-18: 5 mg via ORAL
  Filled 2022-09-18: qty 1

## 2022-09-18 NOTE — Discharge Instructions (Addendum)
You were seen in the ER today for evaluation of your finger pain as well as your pain with urination has been intermittent for the past few months.  Your finger does show a fracture.  You will need to see a hand surgeon to follow-up with this.  Please leave the splint on until you are seen by him as this may need surgery.  I recommend Tylenol 1000 mg and/or ibuprofen 600 mg every 6 hours as needed for pain.  You can use the oxycodone sent to you for breakthrough pain.  Please do not drive or operate her machinery while on this medication as it make you sleepy.  Again, please make sure you call the hand surgeon schedule an appointment.  For your pain with urination that is been intermittent over the past few months, your urine was unremarkable.  I recommend following up with a urologist.  I included information for [facility] into this discharge paperwork.  Please make sure you call to schedule appointment.  If you have any concerns for any worsening symptoms, please return to the nearest emergency room for evaluation.  Contact a doctor if: Your pain or swelling gets worse, even with treatment. You have trouble moving your finger. Get help right away if: Your finger gets numb or turns blue.

## 2022-09-18 NOTE — ED Provider Notes (Signed)
[facility] Provider Note   CSN: [id number] Arrival date & time: 09/18/22  1805     History {Add pertinent medical, surgical, social history, OB history to HPI:1} Chief Complaint  Patient presents with   Hand Pain    Right    Flank Pain    Erik Alsept. is a 32 y.o. male.   Hand Pain  Flank Pain       Home Medications Prior to Admission medications   Medication Sig Start Date End Date Taking? Authorizing Provider  meloxicam (MOBIC) 15 MG tablet Take 1 tablet (15 mg total) by mouth daily. 02/05/20   Darr, Edison Nasuti, PA-C  omeprazole (PRILOSEC) 20 MG capsule Take 1 capsule (20 mg total) by mouth daily. 02/05/20   Darr, Edison Nasuti, PA-C      Allergies    Lactose intolerance (gi)    Review of Systems   Review of Systems  Genitourinary:  Positive for flank pain.    Physical Exam Updated Vital Signs BP 139/83 (BP Location: Right Arm)   Pulse 61   Temp 99 F (37.2 C)   Resp 20   Ht 6\' 1"  (1.854 m)   Wt 77.1 kg   SpO2 99%   BMI 22.43 kg/m  Physical Exam  ED Results / Procedures / Treatments   Labs (all labs ordered are listed, but only abnormal results are displayed) Labs Reviewed  URINALYSIS, W/ REFLEX TO CULTURE (INFECTION SUSPECTED) - Abnormal; Notable for the following components:      Result Value   Color, Urine COLORLESS (*)    All other components within normal limits  COMPREHENSIVE METABOLIC PANEL - Abnormal; Notable for the following components:   AST 14 (*)    All other components within normal limits  CBC WITH DIFFERENTIAL/PLATELET - Abnormal; Notable for the following components:   WBC 11.1 (*)    All other components within normal limits  GC/CHLAMYDIA PROBE AMP ([place]) NOT AT [facility]    EKG None  Radiology DG Hand Complete Right  Result Date: 09/18/2022 CLINICAL DATA:  Trauma to the right hand. EXAM: RIGHT HAND - COMPLETE 3+ VIEW COMPARISON:  None Available. FINDINGS: Comminuted  intra-articular fracture of the base of the middle phalanx of the fourth digit with minimally displaced triangular fragment from the volar base. No other acute fracture. There is no dislocation. The bones are well mineralized. No arthritic changes. Soft tissue swelling of the fourth digit. No radiopaque foreign object or soft tissue gas. IMPRESSION: Comminuted intra-articular fracture of the base of the middle phalanx of the fourth digit. Electronically Signed   By: Anner Crete M.D.   On: 09/18/2022 19:10    Procedures Procedures  {Document cardiac monitor, telemetry assessment procedure when appropriate:1}  Medications Ordered in ED Medications  oxyCODONE (Oxy IR/ROXICODONE) immediate release tablet 5 mg (5 mg Oral Given 09/18/22 1956)    ED Course/ Medical Decision Making/ A&P Clinical Course as of 09/18/22 2155  Sat Sep 18, 2022  1941 Spoke with Dr. Greta Doom on the phone, recommends splinting and will see in office. [RR]    Clinical Course User Index [RR] Sherrell Puller, PA-C   {   Click here for ABCD2, HEART and other calculatorsREFRESH Note before signing :1}                          Medical Decision Making Amount and/or Complexity of Data Reviewed Labs: ordered. Radiology: ordered.  Risk Prescription drug  management.   ***  {Document critical care time when appropriate:1} {Document review of labs and clinical decision tools ie heart score, Chads2Vasc2 etc:1}  {Document your independent review of radiology images, and any outside records:1} {Document your discussion with family members, caretakers, and with consultants:1} {Document social determinants of health affecting pt's care:1} {Document your decision making why or why not admission, treatments were needed:1} Final Clinical Impression(s) / ED Diagnoses Final diagnoses:  None    Rx / DC Orders ED Discharge Orders     None

## 2022-09-18 NOTE — ED Notes (Signed)
Patient verbalizes understanding of discharge instructions. Opportunity for questioning and answers were provided. Armband removed by staff, pt discharged from ED. Ambulated out to lobby with friend  
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
## 2022-09-20 LAB — GC/CHLAMYDIA PROBE AMP (~~LOC~~) NOT AT ARMC
Chlamydia: NEGATIVE
Comment: NEGATIVE
Comment: NORMAL
Neisseria Gonorrhea: NEGATIVE

## 2022-10-07 ENCOUNTER — Encounter (HOSPITAL_BASED_OUTPATIENT_CLINIC_OR_DEPARTMENT_OTHER): Payer: Self-pay | Admitting: Orthopedic Surgery

## 2022-10-07 ENCOUNTER — Other Ambulatory Visit: Payer: Self-pay

## 2022-10-07 NOTE — H&P (Signed)
Preoperative History & Physical Exam  Surgeon: Philipp Ovens, MD  Diagnosis: Fracture of middle phalanx of right ring finger  Planned Procedure: Procedure(s) (LRB): Right ring finger middle phalanx and proximal interphalangeal joint open reduction internal fixation, possible dynamic external fixation (Right)  History of Present Illness:   Patient is a 32 y.o. male with symptoms consistent with Fracture of right ring middle phalanx of finger who presents for surgical intervention. The risks, benefits and alternatives of surgical intervention were discussed and informed consent was obtained prior to surgery.  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 Authorizing Provider  oxyCODONE (ROXICODONE) 5 MG immediate release tablet Take 1 tablet (5 mg total) by mouth every 4 (four) hours as needed for severe pain. 09/18/22  Yes Achille Rich, PA-C    Allergies:  Latex and Lactose intolerance (gi)  Review of Systems: Negative except per HPI.  Physical Exam: Alert and oriented, NAD Head and neck: no masses, normal alignment CV: pulse intact Pulm: no increased work of breathing, respirations even and unlabored Abdomen: non-distended Extremities:  extremities warm and well perfused  LABS: Recent Results (from the past 2160 hour(s))  Urinalysis, w/ Reflex to Culture (Infection Suspected) -Urine, Clean Catch     Status: Abnormal   Collection Time: 09/18/22  6:28 PM  Result Value Ref Range   Specimen Source URINE, CLEAN CATCH    Color, Urine COLORLESS (A) YELLOW   APPearance CLEAR CLEAR   Specific Gravity, Urine 1.016 1.005 - 1.030   pH 6.5 5.0 - 8.0   Glucose, UA NEGATIVE NEGATIVE mg/dL   Hgb urine dipstick NEGATIVE NEGATIVE   Bilirubin Urine NEGATIVE NEGATIVE   Ketones, ur NEGATIVE NEGATIVE mg/dL   Protein, ur NEGATIVE NEGATIVE mg/dL   Nitrite NEGATIVE NEGATIVE   Leukocytes,Ua NEGATIVE NEGATIVE   RBC / HPF 0-5 0 - 5 RBC/hpf   WBC, UA 0-5 0 - 5 WBC/hpf    Comment:        Reflex urine culture not performed if WBC <=10, OR if Squamous epithelial cells >5. If Squamous epithelial cells >5 suggest recollection.    Bacteria, UA NONE SEEN NONE SEEN   Squamous Epithelial / HPF 0-5 0 - 5 /HPF   Mucus PRESENT     Comment: Performed at [facility], [address], [place], [place] [address]  GC/Chlamydia probe amp [facility]) not at [facility]     Status: None   Collection Time: 09/18/22  6:42 PM  Result Value Ref Range   Chlamydia Negative    Neisseria Gonorrhea Negative    Comment Normal Reference [place] Chlamydia - Negative    Comment      Normal Reference Range Neisseria Gonorrhea - Negative  Comprehensive metabolic panel     Status: Abnormal   Collection  Time: 09/18/22  7:55 PM  Result Value Ref Range   Sodium 137 135 - 145 mmol/L   Potassium 3.8 3.5 - 5.1 mmol/L   Chloride 101 98 - 111 mmol/L   CO2 31 22 - 32 mmol/L   Glucose, Bld 87 70 - 99 mg/dL    Comment: Glucose reference range applies only to samples taken after fasting for at least 8 hours.   BUN 12 6 - 20 mg/dL   Creatinine, Ser 1.63 0.61 - 1.24 mg/dL   Calcium 9.9 8.9 - 84.6 mg/dL   Total Protein 7.0 6.5 - 8.1 g/dL   Albumin 4.2 3.5 - 5.0  g/dL   AST 14 (L) 15 - 41 U/L   ALT 15 0 - 44 U/L   Alkaline Phosphatase 53 38 - 126 U/L   Total Bilirubin 0.3 0.3 - 1.2 mg/dL   GFR, Estimated >65 >99 mL/min    Comment: (NOTE) Calculated using the CKD-EPI Creatinine Equation (2021)    Anion gap 5 5 - 15    Comment: Performed at [facility], [address], [place], [place] [address]  CBC with Differential     Status: Abnormal   Collection Time: 09/18/22  7:55 PM  Result Value Ref Range   WBC 11.1 (H) 4.0 - 10.5 K/uL   RBC 5.23 4.22 - 5.81 MIL/uL   Hemoglobin 15.4 13.0 - 17.0 g/dL   HCT 77.9 39.0 - 30.0 %   MCV 84.1 80.0 - 100.0 fL   MCH 29.4 26.0 - 34.0 pg   MCHC 35.0 30.0 - 36.0 g/dL   RDW 92.3 30.0 - 76.2 %   Platelets 333 150 - 400 K/uL   nRBC 0.0 0.0 - 0.2 %   Neutrophils Relative % 68 %   Neutro Abs 7.6 1.7 - 7.7 K/uL   Lymphocytes Relative 22 %   Lymphs Abs 2.4 0.7 - 4.0 K/uL   Monocytes Relative 8 %   Monocytes Absolute 0.9 0.1 - 1.0 K/uL   Eosinophils Relative 1 %   Eosinophils Absolute 0.1 0.0 - 0.5 K/uL   Basophils Relative 1 %   Basophils Absolute 0.1 0.0 - 0.1 K/uL   Immature Granulocytes 0 %   Abs Immature Granulocytes 0.03 0.00 - 0.07 K/uL    Comment: Performed at [facility], [address], [place], [place] [address]     Complete History and Physical exam available in the office notes  Erik Santana

## 2022-10-12 ENCOUNTER — Encounter (HOSPITAL_BASED_OUTPATIENT_CLINIC_OR_DEPARTMENT_OTHER): Payer: Self-pay | Admitting: Orthopedic Surgery

## 2022-10-12 ENCOUNTER — Ambulatory Visit (HOSPITAL_BASED_OUTPATIENT_CLINIC_OR_DEPARTMENT_OTHER): Payer: Self-pay | Admitting: Anesthesiology

## 2022-10-12 ENCOUNTER — Ambulatory Visit (HOSPITAL_BASED_OUTPATIENT_CLINIC_OR_DEPARTMENT_OTHER)
Admission: RE | Admit: 2022-10-12 | Discharge: 2022-10-12 | Disposition: A | Discharge status: Home / Self Care | Payer: Self-pay | Source: Ambulatory Visit | Attending: Orthopedic Surgery | Admitting: Orthopedic Surgery

## 2022-10-12 ENCOUNTER — Encounter (HOSPITAL_BASED_OUTPATIENT_CLINIC_OR_DEPARTMENT_OTHER)
Admission: RE | Disposition: A | Discharge status: Home / Self Care | Payer: Self-pay | Source: Ambulatory Visit | Attending: Orthopedic Surgery

## 2022-10-12 ENCOUNTER — Other Ambulatory Visit: Payer: Self-pay

## 2022-10-12 DIAGNOSIS — F17200 Nicotine dependence, unspecified, uncomplicated: Secondary | ICD-10-CM | POA: Insufficient documentation

## 2022-10-12 DIAGNOSIS — S62624D Displaced fracture of medial phalanx of right ring finger, subsequent encounter for fracture with routine healing: Secondary | ICD-10-CM

## 2022-10-12 DIAGNOSIS — F419 Anxiety disorder, unspecified: Secondary | ICD-10-CM

## 2022-10-12 DIAGNOSIS — S63284A Dislocation of proximal interphalangeal joint of right ring finger, initial encounter: Secondary | ICD-10-CM | POA: Insufficient documentation

## 2022-10-12 DIAGNOSIS — F17210 Nicotine dependence, cigarettes, uncomplicated: Secondary | ICD-10-CM

## 2022-10-12 DIAGNOSIS — S62624A Displaced fracture of medial phalanx of right ring finger, initial encounter for closed fracture: Secondary | ICD-10-CM | POA: Insufficient documentation

## 2022-10-12 DIAGNOSIS — X58XXXA Exposure to other specified factors, initial encounter: Secondary | ICD-10-CM | POA: Insufficient documentation

## 2022-10-12 DIAGNOSIS — J45909 Unspecified asthma, uncomplicated: Secondary | ICD-10-CM

## 2022-10-12 HISTORY — DX: Unspecified asthma, uncomplicated: J45.909

## 2022-10-12 HISTORY — PX: OPEN REDUCTION INTERNAL FIXATION (ORIF) PROXIMAL PHALANX: SHX6235

## 2022-10-12 SURGERY — OPEN REDUCTION INTERNAL FIXATION (ORIF) PROXIMAL PHALANX
Anesthesia: Monitor Anesthesia Care | Site: Ring Finger | Laterality: Right

## 2022-10-12 MED ORDER — ONDANSETRON HCL 4 MG/2ML IJ SOLN
INTRAMUSCULAR | Status: AC
  Filled 2022-10-12: qty 2

## 2022-10-12 MED ORDER — 0.9 % SODIUM CHLORIDE (POUR BTL) OPTIME
TOPICAL | Status: DC | PRN
  Administered 2022-10-12: 1000 mL

## 2022-10-12 MED ORDER — LIDOCAINE 2% (20 MG/ML) 5 ML SYRINGE
INTRAMUSCULAR | Status: AC
  Filled 2022-10-12: qty 5

## 2022-10-12 MED ORDER — LACTATED RINGERS IV SOLN: INTRAVENOUS | Status: DC

## 2022-10-12 MED ORDER — PROPOFOL 10 MG/ML IV BOLUS
INTRAVENOUS | Status: DC | PRN
  Administered 2022-10-12: 100 mg via INTRAVENOUS
  Administered 2022-10-12: 50 mg via INTRAVENOUS

## 2022-10-12 MED ORDER — HYDROCODONE-ACETAMINOPHEN 5-325 MG PO TABS: 1.0000 | ORAL_TABLET | Freq: Four times a day (QID) | ORAL | 0 refills | Status: AC | PRN

## 2022-10-12 MED ORDER — ACETAMINOPHEN 500 MG PO TABS
1000.0000 mg | ORAL_TABLET | Freq: Once | ORAL | Status: AC
  Administered 2022-10-12: 1000 mg via ORAL

## 2022-10-12 MED ORDER — ACETAMINOPHEN 500 MG PO TABS
ORAL_TABLET | ORAL | Status: AC
  Filled 2022-10-12: qty 2

## 2022-10-12 MED ORDER — BUPIVACAINE HCL (PF) 0.5 % IJ SOLN
INTRAMUSCULAR | Status: DC | PRN
  Administered 2022-10-12: 5 mL

## 2022-10-12 MED ORDER — FENTANYL CITRATE (PF) 100 MCG/2ML IJ SOLN
INTRAMUSCULAR | Status: DC | PRN
  Administered 2022-10-12: 100 ug via INTRAVENOUS

## 2022-10-12 MED ORDER — LIDOCAINE HCL (CARDIAC) PF 100 MG/5ML IV SOSY
PREFILLED_SYRINGE | INTRAVENOUS | Status: DC | PRN
  Administered 2022-10-12: 40 mg via INTRAVENOUS

## 2022-10-12 MED ORDER — CEFAZOLIN SODIUM-DEXTROSE 2-4 GM/100ML-% IV SOLN
2.0000 g | INTRAVENOUS | Status: AC
  Administered 2022-10-12: 2 g via INTRAVENOUS

## 2022-10-12 MED ORDER — PROMETHAZINE HCL 25 MG/ML IJ SOLN: 6.2500 mg | INTRAMUSCULAR | Status: DC | PRN

## 2022-10-12 MED ORDER — DEXMEDETOMIDINE HCL IN NACL 80 MCG/20ML IV SOLN
INTRAVENOUS | Status: DC | PRN
  Administered 2022-10-12: 8 ug via BUCCAL
  Administered 2022-10-12: 4 ug via BUCCAL
  Administered 2022-10-12: 8 ug via BUCCAL

## 2022-10-12 MED ORDER — PROPOFOL 500 MG/50ML IV EMUL
INTRAVENOUS | Status: DC | PRN
  Administered 2022-10-12: 100 ug/kg/min via INTRAVENOUS

## 2022-10-12 MED ORDER — FENTANYL CITRATE (PF) 100 MCG/2ML IJ SOLN
INTRAMUSCULAR | Status: AC
  Filled 2022-10-12: qty 2

## 2022-10-12 MED ORDER — MIDAZOLAM HCL 2 MG/2ML IJ SOLN
INTRAMUSCULAR | Status: AC
  Filled 2022-10-12: qty 2

## 2022-10-12 MED ORDER — MIDAZOLAM HCL 2 MG/2ML IJ SOLN
INTRAMUSCULAR | Status: DC | PRN
  Administered 2022-10-12: 2 mg via INTRAVENOUS

## 2022-10-12 MED ORDER — CEFAZOLIN SODIUM-DEXTROSE 2-4 GM/100ML-% IV SOLN
INTRAVENOUS | Status: AC
  Filled 2022-10-12: qty 100

## 2022-10-12 MED ORDER — FENTANYL CITRATE (PF) 100 MCG/2ML IJ SOLN: 25.0000 ug | INTRAMUSCULAR | Status: DC | PRN

## 2022-10-12 MED ORDER — LIDOCAINE HCL 1 % IJ SOLN
INTRAMUSCULAR | Status: DC | PRN
  Administered 2022-10-12: 5 mL

## 2022-10-12 SURGICAL SUPPLY — 35 items
BLADE SURG 15 STRL LF DISP TIS (BLADE) ×1 IMPLANT
BLADE SURG 15 STRL SS (BLADE) ×1
BNDG CMPR 5X4 KNIT ELC UNQ LF (GAUZE/BANDAGES/DRESSINGS) ×1
BNDG CMPR 9X4 STRL LF SNTH (GAUZE/BANDAGES/DRESSINGS) ×1
BNDG ELASTIC 4INX 5YD STR LF (GAUZE/BANDAGES/DRESSINGS) ×1 IMPLANT
BNDG ESMARK 4X9 LF (GAUZE/BANDAGES/DRESSINGS) ×1 IMPLANT
COVER BACK TABLE 60X90IN (DRAPES) ×1 IMPLANT
CUFF TOURN SGL QUICK 18X4 (TOURNIQUET CUFF) ×1 IMPLANT
DRAPE EXTREMITY T 121X128X90 (DISPOSABLE) ×1 IMPLANT
DRAPE OEC MINIVIEW 54X84 (DRAPES) ×1 IMPLANT
DRAPE SURG 17X23 STRL (DRAPES) ×1 IMPLANT
DRSG EMULSION OIL 3X3 NADH (GAUZE/BANDAGES/DRESSINGS) ×1 IMPLANT
GAUZE SPONGE 4X4 12PLY STRL (GAUZE/BANDAGES/DRESSINGS) ×1 IMPLANT
GLOVE BIO SURGEON STRL SZ7.5 (GLOVE) ×1 IMPLANT
GLOVE BIOGEL PI IND STRL 7.5 (GLOVE) ×1 IMPLANT
GOWN STRL REUS W/ TWL LRG LVL3 (GOWN DISPOSABLE) ×1 IMPLANT
GOWN STRL REUS W/TWL LRG LVL3 (GOWN DISPOSABLE) ×1
GOWN STRL REUS W/TWL XL LVL3 (GOWN DISPOSABLE) ×1 IMPLANT
NDL HYPO 22X1.5 SAFETY MO (MISCELLANEOUS) IMPLANT
NEEDLE HYPO 22X1.5 SAFETY MO (MISCELLANEOUS) IMPLANT
NS IRRIG 1000ML POUR BTL (IV SOLUTION) ×1 IMPLANT
PACK BASIN DAY SURGERY FS (CUSTOM PROCEDURE TRAY) ×1 IMPLANT
PADDING CAST ABS COTTON 4X4 ST (CAST SUPPLIES) ×1 IMPLANT
PADDING CAST SYNTHETIC 4X4 STR (CAST SUPPLIES) ×1 IMPLANT
SHEET MEDIUM DRAPE 40X70 STRL (DRAPES) ×1 IMPLANT
SPLINT FIBERGLASS 3X35 (CAST SUPPLIES) IMPLANT
SPLINT FIBERGLASS 4X30 (CAST SUPPLIES) IMPLANT
SPLINT FINGER 5.25 BULB (SOFTGOODS) IMPLANT
SUT ETHILON 4 0 PS 2 18 (SUTURE) ×1 IMPLANT
SYR 10ML LL (SYRINGE) IMPLANT
SYR BULB EAR ULCER 3OZ GRN STR (SYRINGE) ×1 IMPLANT
TAPE SURG TRANSPORE 1 IN (GAUZE/BANDAGES/DRESSINGS) ×1 IMPLANT
TOWEL GREEN STERILE FF (TOWEL DISPOSABLE) ×2 IMPLANT
TRAY DSU PREP LF (CUSTOM PROCEDURE TRAY) ×1 IMPLANT
UNDERPAD 30X36 HEAVY ABSORB (UNDERPADS AND DIAPERS) ×1 IMPLANT

## 2022-10-12 NOTE — Transfer of Care (Signed)
Immediate Anesthesia Transfer of Care Note  Patient: Erik Santana.  Procedure(s) Performed: Right ring finger middle phalanx and proximal interphalangeal joint closed reduction internal percutaneous pinning (Right: Ring Finger)  Patient Location: PACU  Anesthesia Type:MAC  Level of Consciousness: awake, alert , and oriented  Airway & Oxygen Therapy: Patient Spontanous Breathing  Post-op Assessment: Report given to RN and Post -op Vital signs reviewed and stable  Post vital signs: Reviewed and stable  Last Vitals:  Vitals Value Taken Time  BP 105/68 10/12/22 1243  Temp    Pulse 65 10/12/22 1244  Resp 13 10/12/22 1244  SpO2 95 % 10/12/22 1244  Vitals shown include unvalidated device data.  Last Pain:  Vitals:   10/12/22 0936  TempSrc: Temporal  PainSc: 9       Patients Stated Pain Goal: 3 (10/12/22 0936)  Complications: No notable events documented.

## 2022-10-12 NOTE — Discharge Instructions (Addendum)
Orthopaedic Hand Surgery Discharge Instructions  WEIGHT BEARING STATUS: Non weight bearing on operative extremity  DRESSING CARE: Please keep your dressing/splint/cast clean and dry until your follow-up appointment. You may shower by placing a waterproof covering over your dressing/splint/cast. Contact your surgeon if your splint/cast gets wet. It will need to be changed to prevent skin breakdown.  PAIN CONTROL: First line medications for post operative pain control are Tylenol (acetaminophen) and Motrin (ibuprofen) if you are able to take these medications. If you have been prescribed a medication these can be taken as breakthrough pain medications. Please note that some narcotic pain medication has acetaminophen added and you should never consume more than 4,000mg of acetaminophen in 24-hour period. Please note that if you are given Toradol (ketorolac) you should not take similar medications such as ibuprofen or naproxen.  DISCHARGE MEDICATIONS: If you have been prescribed medication it was sent electronically to your pharmacy. No changes have been made to your home medications.  ICE/ELEVATION: Ice and elevate your injured extremity as needed. Avoid direct contact of ice with skin.   BANDAGE FEELS TOO TIGHT: If your bandage feels too tight, first make sure you are elevating your fingers as much as possible. The outer layer of the bandage can be unwrapped and reapplied more loosely. If no improvement, you may carefully cut the inner layer longitudinally until the pressure has resolved and then rewrap the outer layer. If you are not comfortable with these instructions, please call the office and the bandage can be changed for you.   FOLLOW UP: You will be called after surgery with an appointment date and time, however if you have not received a phone call within 3 days, please call during regular office hours at [phone] to schedule a post operative appointment.  Please Seek Medical Attention  if: Call MD for: pain or pressure in chest, jaw, arm, back, neck  Call MD for: temperature greater than 101 F for more than 24 hrs Call MD for: difficulty breathing Call MD for: incision redness, bleeding, drainage  Call MD for: palpitations or feeling that the heart is racing  Call MD for: increased swelling in arm, leg, ankle, or abdomen  Call MD for: lightheadedness, dizziness, fainting Call 911 or go to ER for any medical emergency if you are not able to get in touch with your doctor   J. Reid Spears, MD Orthopaedic Hand Surgeon [facility] Office number: [phone] [address] [place], [place] [address]   Post Anesthesia Home Care Instructions  Activity: Get plenty of rest for the remainder of the day. A responsible individual must stay with you for 24 hours following the procedure.  For the next 24 hours, DO NOT: -Drive a car -Operate machinery -Drink alcoholic beverages -Take any medication unless instructed by your physician -Make any legal decisions or sign important papers.  Meals: Start with liquid foods such as gelatin or soup. Progress to regular foods as tolerated. Avoid greasy, spicy, heavy foods. If nausea and/or vomiting occur, drink only clear liquids until the nausea and/or vomiting subsides. Call your physician if vomiting continues.  Special Instructions/Symptoms: Your throat may feel dry or sore from the anesthesia or the breathing tube placed in your throat during surgery. If this causes discomfort, gargle with warm salt water. The discomfort should disappear within 24 hours.  If you had a scopolamine patch placed behind your ear for the management of post- operative nausea and/or vomiting:  1. The medication in the patch is effective for 72   hours, after which it should be removed.  Wrap patch in a tissue and discard in the trash. Wash hands thoroughly with soap and water. 2. You may remove the patch earlier than 72 hours if you experience  unpleasant side effects which may include dry mouth, dizziness or visual disturbances. 3. Avoid touching the patch. Wash your hands with soap and water after contact with the patch.    May have Tylenol today at 5:15pm 10/12/2022

## 2022-10-12 NOTE — Anesthesia Postprocedure Evaluation (Signed)
Anesthesia Post Note  Patient: Erik Santana.  Procedure(s) Performed: Right ring finger middle phalanx and proximal interphalangeal joint closed reduction internal percutaneous pinning (Right: Ring Finger)     Patient location during evaluation: PACU Anesthesia Type: MAC Level of consciousness: awake and alert Pain management: pain level controlled Vital Signs Assessment: post-procedure vital signs reviewed and stable Respiratory status: spontaneous breathing, nonlabored ventilation, respiratory function stable and patient connected to nasal cannula oxygen Cardiovascular status: stable and blood pressure returned to baseline Postop Assessment: no apparent nausea or vomiting Anesthetic complications: no   No notable events documented.  Last Vitals:  Vitals:   10/12/22 1300 10/12/22 1306  BP: 100/69 110/74  Pulse: (!) 58 (!) 58  Resp: 19 18  Temp:  37.3 C  SpO2: 95% 97%    Last Pain:  Vitals:   10/12/22 1306  TempSrc: Temporal  PainSc: 0-No pain                 Collene Schlichter

## 2022-10-12 NOTE — Anesthesia Preprocedure Evaluation (Addendum)
Anesthesia Evaluation  Patient identified by MRN, date of birth, ID band Patient awake    Reviewed: Allergy & Precautions, NPO status , Patient's Chart, lab work & pertinent test results  Airway Mallampati: II  TM Distance: >3 FB Neck ROM: Full    Dental  (+) Teeth Intact, Dental Advisory Given   Pulmonary asthma , Current Smoker and Patient abstained from smoking.   Pulmonary exam normal breath sounds clear to auscultation       Cardiovascular negative cardio ROS Normal cardiovascular exam Rhythm:Regular Rate:Normal     Neuro/Psych  PSYCHIATRIC DISORDERS Anxiety     negative neurological ROS     GI/Hepatic Neg liver ROS,GERD  ,,  Endo/Other  negative endocrine ROS    Renal/GU negative Renal ROS     Musculoskeletal Fracture of middle phalanx of finger   Abdominal   Peds  (+) ATTENTION DEFICIT DISORDER WITHOUT HYPERACTIVITY Hematology negative hematology ROS (+)   Anesthesia Other Findings Day of surgery medications reviewed with the patient.  Reproductive/Obstetrics                              Anesthesia Physical Anesthesia Plan  ASA: 2  Anesthesia Plan: MAC   Post-op Pain Management: Minimal or no pain anticipated   Induction: Intravenous  PONV Risk Score and Plan: 0 and Midazolam, TIVA and Treatment may vary due to age or medical condition  Airway Management Planned: Natural Airway and Simple Face Mask  Additional Equipment:   Intra-op Plan:   Post-operative Plan:   Informed Consent: I have reviewed the patients History and Physical, chart, labs and discussed the procedure including the risks, benefits and alternatives for the proposed anesthesia with the patient or authorized representative who has indicated his/her understanding and acceptance.     Dental advisory given  Plan Discussed with: CRNA and Anesthesiologist  Anesthesia Plan Comments:           Anesthesia Quick Evaluation

## 2022-10-12 NOTE — Op Note (Signed)
OPERATIVE NOTE  DATE OF PROCEDURE: 10/12/2022  SURGEONS:  Primary: Gomez Cleverly, MD  ASSISTANT: Payton Mccallum, PA-C  Due to the complexity of the surgery an assistant was necessary to aid in retraction, exposure, limb positioning, closure and dressing application. The use of an assistant on this case follows CMS and CPT guidelines, which allows an assistant to be used because of the complexity level of this case.   PREOPERATIVE DIAGNOSIS: Fracture of middle phalanx of right ring finger and dislocation of right ring finger PIP joint  POSTOPERATIVE DIAGNOSIS: Same   NAME OF PROCEDURE:   Right ring finger middle phalanx fracture and PIP joint dorsal fracture dislocation closed reduction and percutaneous pinning. 4 view radiographs of the right ring finger with intraoperative interpretation  ANESTHESIA: Monitor Anesthesia Care + local anesthesia  SKIN PREPARATION: Hibiclens  ESTIMATED BLOOD LOSS: Minimal  IMPLANTS: 0.045 k wires x 2  INDICATIONS:  Erik Santana is a 32 y.o. male who has the above preoperative diagnosis. The patient has decided to proceed with surgical intervention.  Risks, benefits and alternatives of operative management were discussed including, but not limited to, risks of anesthesia complications, infection, pain, persistent symptoms, stiffness, need for future surgery.  The patient understands, agrees and elects to proceed with surgery.    DESCRIPTION OF PROCEDURE: The patient was met in the pre-operative area and their identity was verified.  The operative location and laterality was also verified and marked.  The patient was brought to the OR and was placed supine on the table.  After repeat patient identification with the operative team anesthesia was provided and the patient was prepped and draped in the usual sterile fashion.  A final timeout was performed verifying the correction patient, procedure, location and laterality.  Of antibiotics were provided and then the  right upper extremity was elevated exsanguinated with an Esmarch and tourniquet inflated to 250 mmHg.  A closed reduction of the right ring finger PIP joint was performed reducing the fracture fragments of the middle phalanx as well as the dorsal dislocation of the PIP joint.  After maintenance of the reduction via closed reduction 2 K wires were placed in antegrade fashion in transarticular manner in order to reduce the PIP joint and the fracture fragments.  2 K wires were placed in divergent fashion.  These were then cut beneath the skin.  A sterile bandage was applied followed by a finger splint.  The tourniquet was deflated and the finger was pink and warm and well-perfused with brisk capillary refill to the tip of the digit.  All counts were correct x 2.  The patient was awoken from anesthesia and brought to PACU for recovery in stable condition.   Philipp Ovens, MD

## 2022-10-12 NOTE — Interval H&P Note (Signed)
History and Physical Interval Note:  10/12/2022 11:39 AM  Erik Santana.  has presented today for surgery, with the diagnosis of Fracture of middle phalanx of finger.  The various methods of treatment have been discussed with the patient and family. After consideration of risks, benefits and other options for treatment, the patient has consented to  Procedure(s): Right ring finger middle phalanx and proximal interphalangeal joint open reduction internal fixation, possible dynamic external fixation (Right) as a surgical intervention.  The patient's history has been reviewed, patient examined, no change in status, stable for surgery.  I have reviewed the patient's chart and labs.  Questions were answered to the patient's satisfaction.     Gomez Cleverly

## 2022-10-13 ENCOUNTER — Encounter (HOSPITAL_BASED_OUTPATIENT_CLINIC_OR_DEPARTMENT_OTHER): Payer: Self-pay | Admitting: Orthopedic Surgery

## 2023-08-21 ENCOUNTER — Inpatient Hospital Stay (HOSPITAL_COMMUNITY)
Admission: EM | Admit: 2023-08-21 | Discharge: 2023-08-24 | DRG: 065 | Disposition: A | Discharge status: Rehabilitation Facility | Payer: Self-pay | Attending: Family Medicine | Admitting: Family Medicine

## 2023-08-21 ENCOUNTER — Emergency Department (HOSPITAL_COMMUNITY): Payer: Self-pay

## 2023-08-21 DIAGNOSIS — I629 Nontraumatic intracranial hemorrhage, unspecified: Secondary | ICD-10-CM | POA: Diagnosis present

## 2023-08-21 DIAGNOSIS — K219 Gastro-esophageal reflux disease without esophagitis: Secondary | ICD-10-CM | POA: Diagnosis present

## 2023-08-21 DIAGNOSIS — I10 Essential (primary) hypertension: Secondary | ICD-10-CM | POA: Diagnosis present

## 2023-08-21 DIAGNOSIS — Z91040 Latex allergy status: Secondary | ICD-10-CM

## 2023-08-21 DIAGNOSIS — R29707 NIHSS score 7: Secondary | ICD-10-CM | POA: Diagnosis present

## 2023-08-21 DIAGNOSIS — K5900 Constipation, unspecified: Secondary | ICD-10-CM | POA: Diagnosis present

## 2023-08-21 DIAGNOSIS — F1210 Cannabis abuse, uncomplicated: Secondary | ICD-10-CM | POA: Diagnosis present

## 2023-08-21 DIAGNOSIS — J45909 Unspecified asthma, uncomplicated: Secondary | ICD-10-CM | POA: Diagnosis not present

## 2023-08-21 DIAGNOSIS — I611 Nontraumatic intracerebral hemorrhage in hemisphere, cortical: Principal | ICD-10-CM | POA: Diagnosis present

## 2023-08-21 DIAGNOSIS — G8194 Hemiplegia, unspecified affecting left nondominant side: Secondary | ICD-10-CM | POA: Diagnosis present

## 2023-08-21 DIAGNOSIS — F17210 Nicotine dependence, cigarettes, uncomplicated: Secondary | ICD-10-CM | POA: Diagnosis present

## 2023-08-21 DIAGNOSIS — I612 Nontraumatic intracerebral hemorrhage in hemisphere, unspecified: Secondary | ICD-10-CM | POA: Diagnosis not present

## 2023-08-21 DIAGNOSIS — F1310 Sedative, hypnotic or anxiolytic abuse, uncomplicated: Secondary | ICD-10-CM | POA: Diagnosis present

## 2023-08-21 DIAGNOSIS — R4189 Other symptoms and signs involving cognitive functions and awareness: Secondary | ICD-10-CM | POA: Diagnosis not present

## 2023-08-21 DIAGNOSIS — G4700 Insomnia, unspecified: Secondary | ICD-10-CM | POA: Diagnosis not present

## 2023-08-21 DIAGNOSIS — Z91011 Allergy to milk products: Secondary | ICD-10-CM | POA: Diagnosis not present

## 2023-08-21 DIAGNOSIS — F1110 Opioid abuse, uncomplicated: Secondary | ICD-10-CM | POA: Diagnosis present

## 2023-08-21 DIAGNOSIS — E785 Hyperlipidemia, unspecified: Secondary | ICD-10-CM | POA: Diagnosis present

## 2023-08-21 DIAGNOSIS — F17290 Nicotine dependence, other tobacco product, uncomplicated: Secondary | ICD-10-CM | POA: Diagnosis present

## 2023-08-21 DIAGNOSIS — R1310 Dysphagia, unspecified: Secondary | ICD-10-CM | POA: Diagnosis present

## 2023-08-21 DIAGNOSIS — I454 Nonspecific intraventricular block: Secondary | ICD-10-CM | POA: Diagnosis not present

## 2023-08-21 DIAGNOSIS — Z8719 Personal history of other diseases of the digestive system: Secondary | ICD-10-CM

## 2023-08-21 DIAGNOSIS — F909 Attention-deficit hyperactivity disorder, unspecified type: Secondary | ICD-10-CM | POA: Diagnosis present

## 2023-08-21 DIAGNOSIS — K5901 Slow transit constipation: Secondary | ICD-10-CM | POA: Diagnosis not present

## 2023-08-21 DIAGNOSIS — R001 Bradycardia, unspecified: Secondary | ICD-10-CM | POA: Diagnosis not present

## 2023-08-21 DIAGNOSIS — E739 Lactose intolerance, unspecified: Secondary | ICD-10-CM | POA: Diagnosis not present

## 2023-08-21 DIAGNOSIS — Z823 Family history of stroke: Secondary | ICD-10-CM | POA: Diagnosis not present

## 2023-08-21 DIAGNOSIS — Z886 Allergy status to analgesic agent status: Secondary | ICD-10-CM | POA: Diagnosis not present

## 2023-08-21 DIAGNOSIS — F419 Anxiety disorder, unspecified: Secondary | ICD-10-CM | POA: Diagnosis present

## 2023-08-21 DIAGNOSIS — G253 Myoclonus: Secondary | ICD-10-CM | POA: Diagnosis not present

## 2023-08-21 DIAGNOSIS — I69354 Hemiplegia and hemiparesis following cerebral infarction affecting left non-dominant side: Secondary | ICD-10-CM | POA: Diagnosis not present

## 2023-08-21 DIAGNOSIS — S06310S Contusion and laceration of right cerebrum without loss of consciousness, sequela: Secondary | ICD-10-CM | POA: Diagnosis not present

## 2023-08-21 DIAGNOSIS — S06339A Contusion and laceration of cerebrum, unspecified, with loss of consciousness of unspecified duration, initial encounter: Secondary | ICD-10-CM | POA: Diagnosis not present

## 2023-08-21 DIAGNOSIS — R197 Diarrhea, unspecified: Secondary | ICD-10-CM | POA: Diagnosis not present

## 2023-08-21 LAB — CBC
HCT: 48.8 % (ref 39.0–52.0)
Hemoglobin: 16.9 g/dL (ref 13.0–17.0)
MCH: 28.9 pg (ref 26.0–34.0)
MCHC: 34.6 g/dL (ref 30.0–36.0)
MCV: 83.6 fL (ref 80.0–100.0)
Platelets: 319 10*3/uL (ref 150–400)
RBC: 5.84 MIL/uL — ABNORMAL HIGH (ref 4.22–5.81)
RDW: 12.1 % (ref 11.5–15.5)
WBC: 5 10*3/uL (ref 4.0–10.5)
nRBC: 0 % (ref 0.0–0.2)

## 2023-08-21 LAB — DIFFERENTIAL
Abs Immature Granulocytes: 0.01 10*3/uL (ref 0.00–0.07)
Basophils Absolute: 0.1 10*3/uL (ref 0.0–0.1)
Basophils Relative: 1 %
Eosinophils Absolute: 0.4 10*3/uL (ref 0.0–0.5)
Eosinophils Relative: 8 %
Immature Granulocytes: 0 %
Lymphocytes Relative: 24 %
Lymphs Abs: 1.2 10*3/uL (ref 0.7–4.0)
Monocytes Absolute: 0.4 10*3/uL (ref 0.1–1.0)
Monocytes Relative: 7 %
Neutro Abs: 3 10*3/uL (ref 1.7–7.7)
Neutrophils Relative %: 60 %

## 2023-08-21 LAB — COMPREHENSIVE METABOLIC PANEL
ALT: 16 U/L (ref 0–44)
AST: 22 U/L (ref 15–41)
Albumin: 4.2 g/dL (ref 3.5–5.0)
Alkaline Phosphatase: 52 U/L (ref 38–126)
Anion gap: 12 (ref 5–15)
BUN: 6 mg/dL (ref 6–20)
CO2: 23 mmol/L (ref 22–32)
Calcium: 9.7 mg/dL (ref 8.9–10.3)
Chloride: 102 mmol/L (ref 98–111)
Creatinine, Ser: 1.03 mg/dL (ref 0.61–1.24)
GFR, Estimated: >60 mL/min (ref >60)
Glucose, Bld: 157 mg/dL — ABNORMAL HIGH (ref 70–99)
Potassium: 3.8 mmol/L (ref 3.5–5.1)
Sodium: 137 mmol/L (ref 135–145)
Total Bilirubin: 1 mg/dL (ref 0.0–1.2)
Total Protein: 7.4 g/dL (ref 6.5–8.1)

## 2023-08-21 LAB — RAPID URINE DRUG SCREEN, HOSP PERFORMED
Amphetamines: NOT DETECTED
Barbiturates: NOT DETECTED
Benzodiazepines: NOT DETECTED
Cocaine: NOT DETECTED
Opiates: NOT DETECTED
Tetrahydrocannabinol: POSITIVE — AB

## 2023-08-21 LAB — CK: Total CK: 101 U/L (ref 49–397)

## 2023-08-21 LAB — I-STAT CHEM 8, ED
BUN: 6 mg/dL (ref 6–20)
Calcium, Ion: 1.17 mmol/L (ref 1.15–1.40)
Chloride: 100 mmol/L (ref 98–111)
Creatinine, Ser: 1 mg/dL (ref 0.61–1.24)
Glucose, Bld: 149 mg/dL — ABNORMAL HIGH (ref 70–99)
HCT: 49 % (ref 39.0–52.0)
Hemoglobin: 16.7 g/dL (ref 13.0–17.0)
Potassium: 3.8 mmol/L (ref 3.5–5.1)
Sodium: 140 mmol/L (ref 135–145)
TCO2: 23 mmol/L (ref 22–32)

## 2023-08-21 LAB — ETHANOL: Alcohol, Ethyl (B): <10 mg/dL (ref <10)

## 2023-08-21 LAB — APTT: aPTT: 27 s (ref 24–36)

## 2023-08-21 LAB — PROTIME-INR
INR: 1.1 (ref 0.8–1.2)
Prothrombin Time: 14.1 s (ref 11.4–15.2)

## 2023-08-21 LAB — CBG MONITORING, ED: Glucose-Capillary: 170 mg/dL — ABNORMAL HIGH (ref 70–99)

## 2023-08-21 LAB — MRSA NEXT GEN BY PCR, NASAL: MRSA by PCR Next Gen: NOT DETECTED

## 2023-08-21 MED ORDER — HYDROCODONE-ACETAMINOPHEN 5-325 MG PO TABS
1.0000 | ORAL_TABLET | Freq: Once | ORAL | Status: AC
  Administered 2023-08-21: 1 via ORAL
  Filled 2023-08-21: qty 1

## 2023-08-21 MED ORDER — ACETAMINOPHEN 650 MG RE SUPP: 650.0000 mg | RECTAL | Status: DC | PRN

## 2023-08-21 MED ORDER — ACETAMINOPHEN 160 MG/5ML PO SOLN: 650.0000 mg | ORAL | Status: DC | PRN

## 2023-08-21 MED ORDER — SODIUM CHLORIDE 0.9% FLUSH
3.0000 mL | Freq: Once | INTRAVENOUS | Status: AC
  Administered 2023-08-21: 3 mL via INTRAVENOUS

## 2023-08-21 MED ORDER — SENNOSIDES-DOCUSATE SODIUM 8.6-50 MG PO TABS
1.0000 | ORAL_TABLET | Freq: Two times a day (BID) | ORAL | Status: DC
Start: 2023-08-21 — End: 2023-08-24
  Administered 2023-08-21: 1 via ORAL
  Filled 2023-08-21 (×2): qty 1

## 2023-08-21 MED ORDER — STROKE: EARLY STAGES OF RECOVERY BOOK
Freq: Once | Status: AC
  Filled 2023-08-21: qty 1

## 2023-08-21 MED ORDER — ONDANSETRON HCL 4 MG/2ML IJ SOLN
INTRAMUSCULAR | Status: AC
  Administered 2023-08-21: 4 mg
  Filled 2023-08-21: qty 2

## 2023-08-21 MED ORDER — LABETALOL HCL 5 MG/ML IV SOLN
20.0000 mg | Freq: Once | INTRAVENOUS | Status: DC
Start: 2023-08-21 — End: 2023-08-24

## 2023-08-21 MED ORDER — NICOTINE 14 MG/24HR TD PT24
14.0000 mg | MEDICATED_PATCH | Freq: Every day | TRANSDERMAL | Status: DC
  Administered 2023-08-21 – 2023-08-24 (×4): 14 mg via TRANSDERMAL
  Filled 2023-08-21 (×4): qty 1

## 2023-08-21 MED ORDER — CLEVIDIPINE BUTYRATE 0.5 MG/ML IV EMUL: 0.0000 mg/h | INTRAVENOUS | Status: DC

## 2023-08-21 MED ORDER — ACETAMINOPHEN 325 MG PO TABS
650.0000 mg | ORAL_TABLET | ORAL | Status: DC | PRN
  Administered 2023-08-21 – 2023-08-24 (×5): 650 mg via ORAL
  Filled 2023-08-21 (×6): qty 2

## 2023-08-21 MED ORDER — IOHEXOL 350 MG/ML SOLN
75.0000 mL | Freq: Once | INTRAVENOUS | Status: AC | PRN
  Administered 2023-08-21: 75 mL via INTRAVENOUS

## 2023-08-21 MED ORDER — PANTOPRAZOLE SODIUM 40 MG IV SOLR
40.0000 mg | Freq: Every day | INTRAVENOUS | Status: DC
  Administered 2023-08-21: 40 mg via INTRAVENOUS
  Filled 2023-08-21: qty 10

## 2023-08-21 MED ORDER — CHLORHEXIDINE GLUCONATE CLOTH 2 % EX PADS
6.0000 | MEDICATED_PAD | Freq: Every day | CUTANEOUS | Status: DC
  Administered 2023-08-21 – 2023-08-22 (×2): 6 via TOPICAL

## 2023-08-21 NOTE — H&P (Signed)
 NEUROLOGY H&P NOTE   Date of service: August 21, 2023 Patient Name: Erik Santana. MRN:  [id number] DOB:  06/21/1991 Chief Complaint: "Code Stroke"  History of Present Illness  Erik Santana. is a 33 y.o. male with hx of mallory weiss tear in 2014, anxiety, ADHD, and GERD presenting with left side weakness.  He woke up this afternoon around 1430 and felt like he was in his usual state of health.  He went to the bathroom and did have a bowel movement. He reports mild constipation but denies straining, lightheadedness or dizziness.  As he was standing up he lost all strength in his left leg fell and hit his head on the counter.  He was able to call EMS himself.  EMS found him on the ground.  He does have a c-collar in place. He does endorse feeling sick earlier this week and did take some Percocet.  He denies methamphetamine, cocaine, THC use. He does endorse a headache and neck pain now.  He currently works Monday through Friday.  He was off work today.  He states he stayed up late last night and probably went to bed around 6 or 7 AM.  He woke up at 1430.  He has not had anything to eat or drink today.   Last known well: 1530 Modified rankin score: 0-Completely asymptomatic and back to baseline post- stroke ICH Score: 0 tNKASE: Not offered due to ICH Thrombectomy: not offered due to ICH NIHSS components Score: Comment  1a Level of Conscious 0[x]  1[]  2[]  3[]      1b LOC Questions 0[x]  1[]  2[]       1c LOC Commands 0[x]  1[]  2[]       2 Best Gaze 0[x]  1[]  2[]       3 Visual 0[x]  1[]  2[]  3[]      4 Facial Palsy 0[x]  1[]  2[]  3[]      5a Motor Arm - left 0[]  1[]  2[]  3[]  4[x]  UN[]    5b Motor Arm - Right 0[x]  1[]  2[]  3[]  4[]  UN[]    6a Motor Leg - Left 0[]  1[]  2[]  3[x]  4[]  UN[]    6b Motor Leg - Right 0[x]  1[]  2[]  3[]  4[]  UN[]    7 Limb Ataxia 0[x]  1[]  2[]  3[]  UN[]     8 Sensory 0[x]  1[]  2[]  UN[]      9 Best Language 0[x]  1[]  2[]  3[]      10 Dysarthria 0[x]  1[]  2[]  UN[]      11 Extinct. and  Inattention 0[x]  1[]  2[]       TOTAL: 7      ROS  Comprehensive ROS performed and pertinent positives documented in the HPI.  Past History   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   Past Surgical History:  Procedure Laterality Date   ESOPHAGOGASTRODUODENOSCOPY N/A 10/18/2012   Procedure: ESOPHAGOGASTRODUODENOSCOPY (EGD);  Surgeon: Louis Meckel, MD;  Location: [facility];  Service: Endoscopy;  Laterality: N/A;   ESOPHAGOGASTRODUODENOSCOPY (EGD) WITH PROPOFOL N/A 10/18/2012   Procedure: ESOPHAGOGASTRODUODENOSCOPY (EGD) WITH PROPOFOL;  Surgeon: Louis Meckel, MD;  Location: [facility];  Service: Endoscopy;  Laterality: N/A;   FINGER SURGERY     OPEN REDUCTION INTERNAL FIXATION (ORIF) PROXIMAL PHALANX Right 10/12/2022   Procedure: Right ring finger middle phalanx and proximal interphalangeal joint closed reduction internal percutaneous pinning;  Surgeon: Gomez Cleverly, MD;  Location: [facility];  Service: Orthopedics;  Laterality: Right;   Family History  Problem Relation Age of Onset   Healthy Mother    Healthy Father    Social History   Socioeconomic History   Marital status: Single    Spouse name: Not on file   Number of children: Not on file   Years of education: Not on file   Highest education level: Not on file  Occupational History   Not on file  Tobacco Use   Smoking status: Every Day    Current packs/day: 0.50    Types: Cigarettes   Smokeless tobacco: Never  Vaping Use   Vaping status: Some Days  Substance and Sexual Activity   Alcohol use: Yes    Comment: occ   Drug use: No   Sexual activity: Not on file  Other Topics Concern   Not on file  Social History Narrative   Not on file   Social Drivers of Health   Financial Resource Strain: Not on file  Food Insecurity: Not on file  Transportation Needs: Not  on file  Physical Activity: Not on file  Stress: Not on file  Social Connections: Not on file   Allergies  Allergen Reactions   Latex Hives   Lactose Intolerance (Gi) Diarrhea    Medications  (Not in a hospital admission)    Vitals   Vitals:   08/21/23 1645 08/21/23 1650 08/21/23 1656 08/21/23 1700  BP: 130/81 130/81  128/79  Pulse:   80 82  Resp:   20 17  SpO2: 98%  100% 100%  Weight:         Body mass index is 22.4 kg/m.  Physical Exam   Constitutional: Appears well-developed and well-nourished.  Psych: Affect appropriate to situation. Endorses feeling anxious  Eyes: No scleral injection.  HENT: No OP obstruction.  Head: Normocephalic.  Cardiovascular: Normal rate and regular rhythm.  Respiratory: Effort normal, non-labored breathing.  GI: Soft.  No distension. There is no tenderness.  Skin: WDI.   Neurologic Examination   Neuro: Mental Status: Patient is awake, alert, oriented to person, place, month, year, and situation. Patient is able to give a clear and coherent history. No signs of aphasia or neglect Cranial Nerves: II: Visual Fields are full. Pupils are equal, round, and reactive to light.   III,IV, VI: EOMI without ptosis or diploplia.  V: Facial sensation is symmetric to temperature VII: Facial movement is symmetric resting and smiling VIII: Hearing is intact to voice X: Palate elevates symmetrically XI: Shoulder shrug is symmetric. XII: Tongue protrudes midline without atrophy or fasciculations.  Motor: Tone is increased in left upper and left lower extremity with clonus in lower extremity. Bulk is normal.  Left upper extremity and left lower extremity with no antigravity strength Full strength in right upper and lower extremity Sensory: Sensation is symmetric to light touch and temperature in the arms and legs. No extinction to DSS present.  Deep Tendon Reflexes: 3+ on the left.  Cerebellar: FNF and HKS intact on the right    Labs    CBC:  Recent Labs  Lab 08/21/23 1638 08/21/23 1641  WBC 5.0  --   NEUTROABS 3.0  --   HGB 16.9 16.7  HCT 48.8 49.0  MCV 83.6  --   PLT 319  --     Basic Metabolic Panel:  Lab Results  Component Value Date   NA 140 08/21/2023   K 3.8 08/21/2023   CO2 23 08/21/2023   GLUCOSE 149 (H)  08/21/2023   BUN 6 08/21/2023   CREATININE 1.00 08/21/2023   CALCIUM 9.7 08/21/2023   GFRNONAA >60 08/21/2023   GFRAA >90 10/18/2012   UDS pending  Alcohol Level     Component Value Date/Time   ETH <10 08/21/2023 1638   INR  Lab Results  Component Value Date   INR 1.1 08/21/2023   APTT  Lab Results  Component Value Date   APTT 27 08/21/2023     CT Head without contrast(Personally reviewed): 3.9 x 2.4 x 4.1 cm parenchymal hemorrhage centered at the right frontoparietal junction. No evidence of intraventricular extension. No mass effect.  CT angio Head and Neck with contrast(Personally reviewed): No intracranial large vessel occlusion or significant stenosis. No hemodynamically significant stenosis in the neck. Poor visualization of the vein of [place] on the right.   CT Venogram: No clear evidence of dural venous sinus thrombus.  Question some increased vasculature along the upper margin of the hemorrhage-which is most likely reactive/due to drainage but could be related to a dural aVF-formal read pending-discussed with the rads  CT C Spine: No acute cervical spine fracture  Impression   Erik Santana. is a 33 y.o. male mallory weiss tear in 2014, anxiety, ADHD, and GERD presenting with left side weakness.  He woke up this afternoon around 1430 and felt like he was in his usual state of health.  He went to the bathroom and as he was standing up he lost all strength in his left leg fell and hit his head on the counter. He does endorse a headache and neck pain now.  Left upper and lower extremity with increased tone and significant weakness.  CT head, CT a head and neck, CT  venogram, CT C-spine completed in ED.  He is currently wearing a c-collar.  Blood pressure well-controlled, CBG 170.  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 -Systolic blood pressure goal 130-150.  Use labetalol and hydralazine as needed and if drip is required, use Cleviprex. -He is currently maintaining his pressures without the use of antihypertensive medications. -No antiplatelets or anticoagulants-SCDs for DVT prophylaxis. -I have ordered the MRI for 2 AM to look for hematoma stability-if it is not done at that time, please obtain a CT head at that time. -2D echocardiogram, A1c, lipid panel -q1hour neurochecks -Telemetry -Frequent rechecks per the ICH protocol. -Stat CT head for any neurochange-please call the on-call neurologist at that time -Repeat labs in the morning.  Replete electrolytes as necessary -Therapy assessments post 24 hours.  Given his young age and somewhat atypical location for spontaneous bleeds in this age group, consider a diagnostic cerebral angiogram electively in the week.  I have not called the on-call interventional list since this is not emergent but I will leave this for the stroke team to follow on Monday.    DVT prophylaxis: SCDs GI: PPI Bowel: Docusate senna CODE STATUS: Full code Activity: Bedrest for the first 24 hours, then mobilize.  ______________________________________________________________________   Leonia Reeves, NP [facility]   Attending Neurohospitalist Addendum Patient seen and examined with APP/Resident. Agree with the history and physical as documented above. Agree with the plan as documented, which I helped formulate. I have independently reviewed the chart, obtained history, review of  systems and examined the patient.I have personally reviewed pertinent head/neck/spine imaging (CT/MRI). Please  feel free to call with any questions.  -- Milon Dikes, MD Neurologist [facility] Pager: [phone]  CRITICAL CARE ATTESTATION Performed by: Milon Dikes, MD Total critical care time: 41 minutes Critical care time was exclusive of separately billable procedures and treating other patients and/or supervising APPs/Residents/Students Critical care was necessary to treat or prevent imminent or life-threatening deterioration. This patient is critically ill and at significant risk for neurological worsening and/or death and care requires constant monitoring. Critical care was time spent personally by me on the following activities: development of treatment plan with patient and/or surrogate as well as nursing, discussions with consultants, evaluation of patient's response to treatment, examination of patient, obtaining history from patient or surrogate, ordering and performing treatments and interventions, ordering and review of laboratory studies, ordering and review of radiographic studies, pulse oximetry, re-evaluation of patient's condition, participation in multidisciplinary rounds and medical decision making of high complexity in the care of this patient.

## 2023-08-21 NOTE — ED Triage Notes (Signed)
 Pt BIB [facility] from home as a code stroke. Pt stated he was getting up from the toilet , and felt weak then fell on the floor. Pt did hit his head. Pt also admitted taking percocet. VSS. A&O X4. Pt has deficits of moving his L side.

## 2023-08-21 NOTE — ED Provider Notes (Signed)
 [place] EMERGENCY DEPARTMENT AT [facility] Provider Note   CSN: [id number] Arrival date & time: 08/21/23  1630     History  Chief Complaint  Patient presents with   Code Stroke    Erik Santana. is a 33 y.o. male.  Patient is a 33 year old male with no known past medical history presenting to the emergency department with left-sided weakness.  The patient reports just prior to arrival he was using the bathroom on the toilet.  He states when he tried to stand up his left side was weak and caused him to fall.  He states he thinks he may have hit his head and is having some neck pain.  He denies any associated headache.  He states he has been sick with a GI bug the last 2 days with vomiting and diarrhea but has had no vomiting today.  He denies any associated numbness or vision changes.  Denies any blood thinner use.  He states that he smokes about a pack of cigarettes a day and occasionally takes narcotic pills recreationally and did take some today.  He denies any alcohol use.  Family does report family history of strokes.  The history is provided by the patient, the EMS personnel and a relative.       Home Medications Prior to Admission medications   Medication Sig Start Date End Date Taking? Authorizing Provider  Ibuprofen (ADVIL) 200 MG CAPS Take by mouth.   Yes [provider]  NYQUIL SEVERE COLD/FLU 5-6.25-10-325 MG/15ML LIQD Take 15-30 mLs by mouth every 6 (six) hours as needed (for cold-like symptoms).   Yes [provider]  TYLENOL 500 MG tablet Take 500-1,000 mg by mouth every 6 (six) hours as needed for mild pain (pain score 1-3) or headache.   Yes [provider]      Allergies    Latex, Nsaids, and Lactose intolerance (gi)    Review of Systems   Review of Systems  Physical Exam Updated Vital Signs BP 128/79   Pulse 82   Temp 98.2 F (36.8 C) (Oral)   Resp 17   Wt 77 kg   SpO2 100%   BMI 22.40 kg/m  Physical  Exam Vitals and nursing note reviewed.  Constitutional:      General: He is not in acute distress.    Appearance: Normal appearance.  HENT:     Head: Normocephalic and atraumatic.     Nose: Nose normal.     Mouth/Throat:     Mouth: Mucous membranes are moist.     Pharynx: Oropharynx is clear.  Eyes:     Extraocular Movements: Extraocular movements intact.     Conjunctiva/sclera: Conjunctivae normal.     Pupils: Pupils are equal, round, and reactive to light.  Neck:     Comments: C-collar in place Cardiovascular:     Rate and Rhythm: Normal rate and regular rhythm.     Heart sounds: Normal heart sounds.  Pulmonary:     Effort: Pulmonary effort is normal.     Breath sounds: Normal breath sounds.  Abdominal:     General: Abdomen is flat.     Palpations: Abdomen is soft.     Tenderness: There is no abdominal tenderness.  Musculoskeletal:     Right lower leg: No edema.     Left lower leg: No edema.  Skin:    General: Skin is warm and dry.  Neurological:     Mental Status: He is alert and oriented  to person, place, and time.     Comments: Paralysis of LUE and LLE with contractures of the L hand, muscle twitching in the left leg, and hyper-reflexia  5/5 strength with no drift in RUE and RLE No obvious facial droop Normal speech Normal sensation in all 4 extremities  Psychiatric:        Mood and Affect: Mood normal.        Behavior: Behavior normal.     ED Results / Procedures / Treatments   Labs (all labs ordered are listed, but only abnormal results are displayed) Labs Reviewed  CBC - Abnormal; Notable for the following components:      Result Value   RBC 5.84 (*)    All other components within normal limits  COMPREHENSIVE METABOLIC PANEL - Abnormal; Notable for the following components:   Glucose, Bld 157 (*)    All other components within normal limits  I-STAT CHEM 8, ED - Abnormal; Notable for the following components:   Glucose, Bld 149 (*)    All other  components within normal limits  CBG MONITORING, ED - Abnormal; Notable for the following components:   Glucose-Capillary 170 (*)    All other components within normal limits  PROTIME-INR  APTT  DIFFERENTIAL  ETHANOL  CK  RAPID URINE DRUG SCREEN, HOSP PERFORMED  HIV ANTIBODY (ROUTINE TESTING W REFLEX)  LIPID PANEL  HEMOGLOBIN A1C    EKG EKG Interpretation Date/Time:  Sunday August 21 2023 16:55:55 EST Ventricular Rate:  78 PR Interval:  159 QRS Duration:  116 QT Interval:  393 QTC Calculation: 448 R Axis:   80  Text Interpretation: Sinus rhythm Nonspecific intraventricular conduction delay No previous ECGs available Confirmed by Elayne Snare ([id number]) on 08/21/2023 5:55:38 PM  Radiology CT C-SPINE NO CHARGE Result Date: 08/21/2023 CLINICAL DATA:  Left-sided weakness, difficulty ambulating, intracranial hemorrhage EXAM: CT CERVICAL SPINE WITHOUT CONTRAST TECHNIQUE: Multidetector CT imaging of the cervical spine was performed without intravenous contrast. Multiplanar CT image reconstructions were also generated. RADIATION DOSE REDUCTION: This exam was performed according to the departmental dose-optimization program which includes automated exposure control, adjustment of the mA and/or kV according to patient size and/or use of iterative reconstruction technique. COMPARISON:  None Available. FINDINGS: Alignment: Alignment is anatomic. Skull base and vertebrae: No acute fracture. No primary bone lesion or focal pathologic process. Soft tissues and spinal canal: No prevertebral fluid or swelling. No visible canal hematoma. Please refer to separately reported CT angiography exam for vascular findings. Disc levels:  No significant spondylosis or facet hypertrophy. Upper chest: Airway is patent. Visualized portions of the lung apices are clear. Other: Reconstructed images demonstrate no additional findings. IMPRESSION: 1. No acute cervical spine fracture. Electronically Signed   By: Sharlet Salina M.D.   On: 08/21/2023 17:30   CT ANGIO HEAD NECK W WO CM (CODE STROKE) Result Date: 08/21/2023 CLINICAL DATA:  Neuro deficit, acute, stroke suspected EXAM: CT ANGIOGRAPHY HEAD AND NECK WITH AND WITHOUT CONTRAST TECHNIQUE: Multidetector CT imaging of the head and neck was performed using the standard protocol during bolus administration of intravenous contrast. Multiplanar CT image reconstructions and MIPs were obtained to evaluate the vascular anatomy. Carotid stenosis measurements (when applicable) are obtained utilizing NASCET criteria, using the distal internal carotid diameter as the denominator. RADIATION DOSE REDUCTION: This exam was performed according to the departmental dose-optimization program which includes automated exposure control, adjustment of the mA and/or kV according to patient size and/or use of iterative reconstruction technique. CONTRAST:  Iodinated  contrast was used to improve disease detection COMPARISON:  CT head 04/21/19 FINDINGS: CT HEAD FINDINGS See same day CT head for intracranial findings CTA NECK FINDINGS Aortic arch: Standard branching. Imaged portion shows no evidence of aneurysm or dissection. No significant stenosis of the major arch vessel origins. Right carotid system: No evidence of dissection, stenosis (50% or greater), or occlusion. Left carotid system: No evidence of dissection, stenosis (50% or greater), or occlusion. Vertebral arteries: Codominant. No evidence of dissection, stenosis (50% or greater), or occlusion. Skeleton: Negative. Other neck: Negative. Upper chest: Negative. Review of the MIP images confirms the above findings CTA HEAD FINDINGS Anterior circulation: No significant stenosis, proximal occlusion, aneurysm, or vascular malformation. Posterior circulation: No significant stenosis, proximal occlusion, aneurysm, or vascular malformation. Venous sinuses: See separately dictated CT venogram Anatomic variants: None Review of the MIP images confirms the  above findings IMPRESSION: 1. No intracranial large vessel occlusion or significant stenosis. 2. No hemodynamically significant stenosis in the neck. 3. Poor visualization of the vein of [place] on the right. see separately dictated CT venogram for additional findings Electronically Signed   By: Lorenza Cambridge M.D.   On: 08/21/2023 17:04   CT HEAD CODE STROKE WO CONTRAST Result Date: 08/21/2023 CLINICAL DATA:  Code stroke.  Stroke-like symptoms EXAM: CT HEAD WITHOUT CONTRAST TECHNIQUE: Contiguous axial images were obtained from the base of the skull through the vertex without intravenous contrast. RADIATION DOSE REDUCTION: This exam was performed according to the departmental dose-optimization program which includes automated exposure control, adjustment of the mA and/or kV according to patient size and/or use of iterative reconstruction technique. COMPARISON:  None Available. FINDINGS: Brain: 3.9 x 2.4 x 4.1 cm parenchymal hemorrhage centered at the right frontoparietal junction. No evidence of intraventricular extension. No mass effect. No mass lesion no CT evidence of an acute cortical infarct. Vascular: No hyperdense vessel or unexpected calcification. Skull: Normal. Negative for fracture or focal lesion. Sinuses/Orbits: No middle ear or mastoid effusion. Paranasal sinuses are notable for polypoid mucosal thickening in the right maxillary sinus. Orbits are unremarkable. Other: None. ASPECTS [facility] Stroke Program Early CT Score): 10 IMPRESSION: 3.9 x 2.4 x 4.1 cm parenchymal hemorrhage centered at the right frontoparietal junction. No evidence of intraventricular extension. No mass effect. Findings were discussed with Dr. Wilford Corner on 08/21/23 at 4:50 PM. Electronically Signed   By: Lorenza Cambridge M.D.   On: 08/21/2023 16:53    Procedures .Critical Care  Performed by: Rexford Maus, DO Authorized by: Rexford Maus, DO   Critical care provider statement:    Critical care time (minutes):  30    Critical care was necessary to treat or prevent imminent or life-threatening deterioration of the following conditions:  CNS failure or compromise   Critical care was time spent personally by me on the following activities:  Development of treatment plan with patient or surrogate, discussions with consultants, evaluation of patient's response to treatment, examination of patient, ordering and review of laboratory studies, ordering and review of radiographic studies, ordering and performing treatments and interventions, pulse oximetry, re-evaluation of patient's condition and review of old charts     Medications Ordered in ED Medications   stroke: early stages of recovery book (has no administration in time range)  acetaminophen (TYLENOL) tablet 650 mg (has no administration in time range)    Or  acetaminophen (TYLENOL) 160 MG/5ML solution 650 mg (has no administration in time range)    Or  acetaminophen (TYLENOL) suppository 650 mg (has no administration  in time range)  senna-docusate (Senokot-S) tablet 1 tablet (has no administration in time range)  pantoprazole (PROTONIX) injection 40 mg (has no administration in time range)  labetalol (NORMODYNE) injection 20 mg (has no administration in time range)    And  clevidipine (CLEVIPREX) infusion 0.5 mg/mL (has no administration in time range)  sodium chloride flush (NS) 0.9 % injection 3 mL (3 mLs Intravenous Given 08/21/23 1657)  iohexol (OMNIPAQUE) 350 MG/ML injection 75 mL (75 mLs Intravenous Contrast Given 08/21/23 1717)    ED Course/ Medical Decision Making/ A&P Clinical Course as of 08/21/23 1757  Sun Aug 21, 2023  1754 Labs are within normal range.  No obvious aneurysm or venous thrombosis to cause his intracranial hemorrhage.  Patient has remained hemodynamically stable with a stable neurologic exam.  He will be admitted to neurology service for further management of his ICH. [VK]    Clinical Course User Index [VK] Rexford Maus, DO                                 Medical Decision Making This patient presents to the ED with chief complaint(s) of L-sided weakness, fall with pertinent past medical history of substance use which further complicates the presenting complaint. The complaint involves an extensive differential diagnosis and also carries with it a high risk of complications and morbidity.    The differential diagnosis includes CVA, TIA, ICH, mass effect, electrolyte derangement, hypo or hyperglycemia  Additional history obtained: Additional history obtained from EMS  Records reviewed N/A  ED Course and Reassessment: Due to patient's sudden onset of left-sided weakness prior to arrival he was made a prehospital stroke alert and was immediately evaluated by neurology and myself on his arrival.  His airway was intact.  He was found to have paralysis of his left upper and lower extremities with hyperreflexia, blood pressure and glucose on arrival were within normal range and he was immediately transported to CT scanner.  CT he was found to have an intracranial hemorrhage and further imaging including CTA and CTV were ordered.  Independent labs interpretation:  The following labs were independently interpreted: Within normal range  Independent visualization of imaging: - I independently visualized the following imaging with scope of interpretation limited to determining acute life threatening conditions related to emergency care: CT head, CTA/CTV, which revealed right frontal lobe intraparenchymal hemorrhage, no large vessel occlusion or aneurysm, no dural venous thrombosis  Consultation: - Consulted or discussed management/test interpretation w/ external professional: neurology  Consideration for admission or further workup: patient requires admission for ICH Social Determinants of health: substance use    Amount and/or Complexity of Data Reviewed Labs: ordered. Radiology: ordered.  Risk Decision  regarding hospitalization.          Final Clinical Impression(s) / ED Diagnoses Final diagnoses:  Intracranial hemorrhage [facility])    Rx / DC Orders ED Discharge Orders     None         Rexford Maus, DO 08/21/23 1757

## 2023-08-21 NOTE — Code Documentation (Signed)
 Stroke Response Nurse Documentation Code Documentation  Erik Santana. is a 33 y.o. male arriving to [facility]  via [facility] as Code Stroke activation on 08/21/23. Past medical hx of ADD, anxiety, GERD. LKW 1530 when he was in the bathroom and had difficulty upon standing resulting in falling and hitting his head on the cabinet/counter. EMS was called by patient.   Stroke team met patient at bridge, airway cleared by EDP, labs drawn and patient taken to bridge. NIH 7, see flowsheet for details. CT and CTA completed. No TNK/IR due to hemorrhage. Care Plan: q1h NIH and Vitals. SBP 130-150. Bedside handoff with ED RN Chloe.    Scarlette Slice K  Rapid Response RN

## 2023-08-22 ENCOUNTER — Inpatient Hospital Stay (HOSPITAL_COMMUNITY): Payer: Self-pay

## 2023-08-22 ENCOUNTER — Encounter (HOSPITAL_COMMUNITY): Payer: Self-pay | Admitting: Student in an Organized Health Care Education/Training Program

## 2023-08-22 DIAGNOSIS — I629 Nontraumatic intracranial hemorrhage, unspecified: Secondary | ICD-10-CM

## 2023-08-22 DIAGNOSIS — I10 Essential (primary) hypertension: Secondary | ICD-10-CM

## 2023-08-22 LAB — ECHOCARDIOGRAM COMPLETE
AR max vel: 3.31 cm2
AV Peak grad: 5.7 mmHg
Ao pk vel: 1.19 m/s
Area-P 1/2: 3.42 cm2
Height: 73 in
S' Lateral: 3.3 cm
Weight: 2112.89 [oz_av]

## 2023-08-22 LAB — LIPID PANEL
Cholesterol: 172 mg/dL (ref 0–200)
HDL: 35 mg/dL — ABNORMAL LOW (ref >40)
LDL Cholesterol: 119 mg/dL — ABNORMAL HIGH (ref 0–99)
Total CHOL/HDL Ratio: 4.9 ratio
Triglycerides: 88 mg/dL (ref <150)
VLDL: 18 mg/dL (ref 0–40)

## 2023-08-22 LAB — HEMOGLOBIN A1C
Hgb A1c MFr Bld: 5.3 % (ref 4.8–5.6)
Mean Plasma Glucose: 105.41 mg/dL

## 2023-08-22 LAB — HIV ANTIBODY (ROUTINE TESTING W REFLEX): HIV Screen 4th Generation wRfx: NONREACTIVE

## 2023-08-22 LAB — GLUCOSE, CAPILLARY: Glucose-Capillary: 153 mg/dL — ABNORMAL HIGH (ref 70–99)

## 2023-08-22 MED ORDER — SODIUM CHLORIDE 0.9 % NICU IV INFUSION SIMPLE
250.0000 mL | INJECTION | Freq: Once | INTRAVENOUS | Status: AC
  Administered 2023-08-22: 250 mL via INTRAVENOUS
  Filled 2023-08-22: qty 250

## 2023-08-22 MED ORDER — PANTOPRAZOLE SODIUM 40 MG PO TBEC
40.0000 mg | DELAYED_RELEASE_TABLET | Freq: Every day | ORAL | Status: DC
  Administered 2023-08-22: 40 mg via ORAL
  Filled 2023-08-22: qty 1

## 2023-08-22 MED ORDER — BACLOFEN 10 MG PO TABS
10.0000 mg | ORAL_TABLET | Freq: Two times a day (BID) | ORAL | Status: DC
  Administered 2023-08-22 – 2023-08-24 (×5): 10 mg via ORAL
  Filled 2023-08-22 (×5): qty 1

## 2023-08-22 MED ORDER — HYDROXYZINE HCL 25 MG PO TABS
50.0000 mg | ORAL_TABLET | Freq: Four times a day (QID) | ORAL | Status: DC | PRN
  Administered 2023-08-22 – 2023-08-23 (×4): 50 mg via ORAL
  Filled 2023-08-22 (×4): qty 2

## 2023-08-22 MED ORDER — LORAZEPAM 2 MG/ML IJ SOLN
INTRAMUSCULAR | Status: AC
Start: 2023-08-22 — End: 2023-08-22
  Filled 2023-08-22: qty 1

## 2023-08-22 MED ORDER — GADOBUTROL 1 MMOL/ML IV SOLN
8.0000 mL | Freq: Once | INTRAVENOUS | Status: AC | PRN
Start: 2023-08-22 — End: 2023-08-22
  Administered 2023-08-22: 8 mL via INTRAVENOUS

## 2023-08-22 MED ORDER — HYDROMORPHONE HCL 1 MG/ML IJ SOLN
0.5000 mg | Freq: Once | INTRAMUSCULAR | Status: AC
  Administered 2023-08-22: 0.5 mg via INTRAVENOUS
  Filled 2023-08-22: qty 1

## 2023-08-22 MED ORDER — TRAMADOL HCL 50 MG PO TABS
50.0000 mg | ORAL_TABLET | Freq: Once | ORAL | Status: AC
  Administered 2023-08-22: 50 mg via ORAL
  Filled 2023-08-22: qty 1

## 2023-08-22 MED ORDER — LORAZEPAM 2 MG/ML IJ SOLN
0.5000 mg | Freq: Once | INTRAMUSCULAR | Status: AC | PRN
  Administered 2023-08-22: 0.5 mg via INTRAVENOUS

## 2023-08-22 NOTE — TOC CAGE-AID Note (Signed)
 Transition of Care [facility]) - CAGE-AID Screening   Patient Details  Name: Erik Santana. MRN: [id number] Date of Birth: Dec 07, 1990  Transition of Care [facility]) CM/SW Contact:    Mearl Latin, LCSW Phone Number: 08/22/2023, 12:35 PM   Clinical Narrative: Patient reported THC use but declined need for community resources.    CAGE-AID Screening:    Have You Ever Felt You Ought to Cut Down on Your Drinking or Drug Use?: No Have People Annoyed You By Critizing Your Drinking Or Drug Use?: No Have You Felt Bad Or Guilty About Your Drinking Or Drug Use?: No Have You Ever Had a Drink or Used Drugs First Thing In The Morning to Steady Your Nerves or to Get Rid of a Hangover?: No CAGE-AID Score: 0  Substance Abuse Education Offered: Yes

## 2023-08-22 NOTE — Progress Notes (Signed)
 Echocardiogram 2D Echocardiogram has been performed.  Lucendia Herrlich 08/22/2023, 11:49 AM

## 2023-08-22 NOTE — TOC CM/SW Note (Signed)
 Transition of Care [facility]) - Inpatient Brief Assessment   Patient Details  Name: Erik Santana. MRN: [id number] Date of Birth: 12-18-1990  Transition of Care [facility]) CM/SW Contact:    Mearl Latin, LCSW Phone Number: 08/22/2023, 12:30 PM   Clinical Narrative: CSW met with patient and discussed needs. He reported staying with his grandfather (brother at bedside) and has transport as needed. He is requesting help with a PCP and insurance. CSW will request screening from [facility].    Transition of Care Asessment: Insurance and Status: Selfpay Patient has primary care physician: No Home environment has been reviewed: From home with grandfather Prior level of function:: Independent Prior/Current Home Services: No current home services Social Drivers of Health Review: SDOH reviewed no interventions necessary Readmission risk has been reviewed: Yes Transition of care needs: transition of care needs identified, TOC will continue to follow

## 2023-08-22 NOTE — Progress Notes (Signed)
 2300 - Pt endorsing pain and anxiety. Requesting nicotine patch, pain medicine for chronic back pain (states he takes percocet), something for anxiety.  Dr. Amada Jupiter notified, see orders/MAR. -Also discussed 130-150 SBP goals.  Dr. Amada Jupiter okay with SBP lower than 130 with no neuro change  0130 - Pt c/o anxiety, slight nausea.  One time order for MRI given, see MAR
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
## 2023-08-22 NOTE — Progress Notes (Addendum)
 STROKE TEAM PROGRESS NOTE    SIGNIFICANT HOSPITAL EVENTS 2/23 presented with left-sided weakness, CT head with ICH and right frontoparietal junction.  Admitted to the ICU  INTERIM HISTORY/SUBJECTIVE Family at the bedside.  Vitals have been stable not requiring any Cleviprex He is complaining of left hip pain will get x-ray today  He is scheduled for diagnostic cerebral angiogram tomorrow Echo is pending, LDL 119, A1c 5.3.  He has positive for THC.  However he endorses that he takes Percocet and Ativan from the streets Will start CIWA checks   OBJECTIVE  CBC    Component Value Date/Time   WBC 5.0 08/21/2023 1638   RBC 5.84 (H) 08/21/2023 1638   HGB 16.7 08/21/2023 1641   HCT 49.0 08/21/2023 1641   PLT 319 08/21/2023 1638   MCV 83.6 08/21/2023 1638   MCH 28.9 08/21/2023 1638   MCHC 34.6 08/21/2023 1638   RDW 12.1 08/21/2023 1638   LYMPHSABS 1.2 08/21/2023 1638   MONOABS 0.4 08/21/2023 1638   EOSABS 0.4 08/21/2023 1638   BASOSABS 0.1 08/21/2023 1638    BMET    Component Value Date/Time   NA 140 08/21/2023 1641   K 3.8 08/21/2023 1641   CL 100 08/21/2023 1641   CO2 23 08/21/2023 1638   GLUCOSE 149 (H) 08/21/2023 1641   BUN 6 08/21/2023 1641   CREATININE 1.00 08/21/2023 1641   CALCIUM 9.7 08/21/2023 1638   GFRNONAA >60 08/21/2023 1638    IMAGING past 24 hours ECHOCARDIOGRAM COMPLETE Result Date: 08/22/2023    ECHOCARDIOGRAM REPORT   Patient Name:   Erik Santana. Date of Exam: 08/22/2023 Medical Rec #:  [id number]         Height:       73.0 in Accession #:    [id number]        Weight:       132.1 lb Date of Birth:  1991-04-09        BSA:          1.804 m Patient Age:    32 years          BP:           124/80 mmHg Patient Gender: M                 HR:           55 bpm. Exam Location:  Inpatient Procedure: 2D Echo, Cardiac Doppler and Color Doppler (Both Spectral and Color            Flow Doppler were utilized during procedure). Indications:    Stroke I63.9  History:         Patient has no prior history of Echocardiogram examinations.  Sonographer:    Lucendia Herrlich RCS Referring Phys: [id number] ASHISH ARORA IMPRESSIONS  1. Left ventricular ejection fraction, by estimation, is 55 to 60%. The left ventricle has normal function. The left ventricle has no regional wall motion abnormalities. Left ventricular diastolic parameters are indeterminate.  2. Right ventricular systolic function is normal. The right ventricular size is normal.  3. The mitral valve is normal in structure. No evidence of mitral valve regurgitation. No evidence of mitral stenosis.  4. The aortic valve is tricuspid. There is mild calcification of the aortic valve. Aortic valve regurgitation is not visualized. No aortic stenosis is present.  5. The inferior vena cava is dilated in size with >50% respiratory variability, suggesting right atrial pressure of 8 mmHg. FINDINGS  Left Ventricle: Left ventricular ejection  fraction, by estimation, is 55 to 60%. The left ventricle has normal function. The left ventricle has no regional wall motion abnormalities. Strain imaging was not performed. The left ventricular internal cavity  size was normal in size. There is no left ventricular hypertrophy. Left ventricular diastolic parameters are indeterminate. Right Ventricle: The right ventricular size is normal. No increase in right ventricular wall thickness. Right ventricular systolic function is normal. Left Atrium: Left atrial size was normal in size. Right Atrium: Right atrial size was normal in size. Pericardium: There is no evidence of pericardial effusion. Mitral Valve: The mitral valve is normal in structure. No evidence of mitral valve regurgitation. No evidence of mitral valve stenosis. Tricuspid Valve: The tricuspid valve is normal in structure. Tricuspid valve regurgitation is trivial. No evidence of tricuspid stenosis. Aortic Valve: The aortic valve is tricuspid. There is mild calcification of the aortic valve.  Aortic valve regurgitation is not visualized. No aortic stenosis is present. Aortic valve peak gradient measures 5.7 mmHg. Pulmonic Valve: The pulmonic valve was normal in structure. Pulmonic valve regurgitation is not visualized. No evidence of pulmonic stenosis. Aorta: The aortic root is normal in size and structure. Venous: The inferior vena cava is dilated in size with greater than 50% respiratory variability, suggesting right atrial pressure of 8 mmHg. IAS/Shunts: No atrial level shunt detected by color flow Doppler. Additional Comments: 3D imaging was not performed.  LEFT VENTRICLE PLAX 2D LVIDd:         5.10 cm   Diastology LVIDs:         3.30 cm   LV e' medial:    16.40 cm/s LV PW:         0.70 cm   LV E/e' medial:  5.2 LV IVS:        0.80 cm   LV e' lateral:   24.20 cm/s LVOT diam:     2.30 cm   LV E/e' lateral: 3.5 LV SV:         81 LV SV Index:   45 LVOT Area:     4.15 cm  RIGHT VENTRICLE             IVC RV S prime:     15.10 cm/s  IVC diam: 2.60 cm TAPSE (M-mode): 2.6 cm LEFT ATRIUM             Index        RIGHT ATRIUM           Index LA diam:        2.50 cm 1.39 cm/m   RA Area:     14.00 cm LA Vol (A2C):   35.6 ml 19.74 ml/m  RA Volume:   33.00 ml  18.30 ml/m LA Vol (A4C):   36.9 ml 20.46 ml/m LA Biplane Vol: 39.9 ml 22.12 ml/m  AORTIC VALVE AV Area (Vmax): 3.31 cm AV Vmax:        119.00 cm/s AV Peak Grad:   5.7 mmHg LVOT Vmax:      94.80 cm/s LVOT Vmean:     55.300 cm/s LVOT VTI:       0.194 m  AORTA Ao Root diam: 3.50 cm MITRAL VALVE MV Area (PHT): 3.42 cm    SHUNTS MV Decel Time: 222 msec    Systemic VTI:  0.19 m MV E velocity: 85.50 cm/s  Systemic Diam: 2.30 cm MV A velocity: 37.20 cm/s MV E/A ratio:  2.30 Arvilla Meres MD Electronically signed by Arvilla Meres MD Signature Date/Time:  08/22/2023/12:02:46 PM    Final    MR BRAIN W WO CONTRAST Result Date: 08/22/2023 CLINICAL DATA:  Hemorrhagic stroke EXAM: MRI HEAD WITHOUT AND WITH CONTRAST TECHNIQUE: Multiplanar, multiecho pulse  sequences of the brain and surrounding structures were obtained without and with intravenous contrast. CONTRAST:  8mL GADAVIST GADOBUTROL 1 MMOL/ML IV SOLN COMPARISON:  None Available. FINDINGS: Brain: Unchanged appearance of intraparenchymal hematoma within the superior right frontal lobe. Moderate surrounding edema. Normal white matter signal, parenchymal volume and CSF spaces. The midline structures are normal. There is no abnormal contrast enhancement. Vascular: Normal flow voids. Skull and upper cervical spine: Normal calvarium and skull base. Visualized upper cervical spine and soft tissues are normal. Sinuses/Orbits:No paranasal sinus fluid levels or advanced mucosal thickening. No mastoid or middle ear effusion. Normal orbits. IMPRESSION: Unchanged appearance of intraparenchymal hematoma within the superior right frontal lobe. No mass lesion. Electronically Signed   By: Deatra Robinson M.D.   On: 08/22/2023 02:31   CT VENOGRAM HEAD Result Date: 08/21/2023 CLINICAL DATA:  Dural venous sinus thrombosis suspected EXAM: CT VENOGRAM HEAD TECHNIQUE: Venographic phase images of the brain were obtained following the administration of intravenous contrast. Multiplanar reformats and maximum intensity projections were generated. RADIATION DOSE REDUCTION: This exam was performed according to the departmental dose-optimization program which includes automated exposure control, adjustment of the mA and/or kV according to patient size and/or use of iterative reconstruction technique. CONTRAST:  75mL OMNIPAQUE IOHEXOL 350 MG/ML SOLN COMPARISON:  Same-day CT head and head and neck angiogram FINDINGS: No evidence of dural venous sinus thrombosis. The deep cerebral veins including the bilateral veins of Trolard are patent. There is no definite evidence of cortical vein thrombosis. There is increased vascularity along the upper margin of the hemorrhage in the right frontoparietal region, which may be reactive in the increased  venous drainage. An additional differential consideration is a subtle dural AV fistula (series 14, image 114). IMPRESSION: 1. No evidence of dural venous sinus thrombosis. 2. Increased vascularity along the upper margin of the hemorrhage in the right frontoparietal region, which may be reactive in the increased venous drainage. An additional differential consideration is a subtle dural AV fistula. Recommend further evaluation with a brain MRI with and without contrast. Electronically Signed   By: Lorenza Cambridge M.D.   On: 08/21/2023 17:48   CT C-SPINE NO CHARGE Result Date: 08/21/2023 CLINICAL DATA:  Left-sided weakness, difficulty ambulating, intracranial hemorrhage EXAM: CT CERVICAL SPINE WITHOUT CONTRAST TECHNIQUE: Multidetector CT imaging of the cervical spine was performed without intravenous contrast. Multiplanar CT image reconstructions were also generated. RADIATION DOSE REDUCTION: This exam was performed according to the departmental dose-optimization program which includes automated exposure control, adjustment of the mA and/or kV according to patient size and/or use of iterative reconstruction technique. COMPARISON:  None Available. FINDINGS: Alignment: Alignment is anatomic. Skull base and vertebrae: No acute fracture. No primary bone lesion or focal pathologic process. Soft tissues and spinal canal: No prevertebral fluid or swelling. No visible canal hematoma. Please refer to separately reported CT angiography exam for vascular findings. Disc levels:  No significant spondylosis or facet hypertrophy. Upper chest: Airway is patent. Visualized portions of the lung apices are clear. Other: Reconstructed images demonstrate no additional findings. IMPRESSION: 1. No acute cervical spine fracture. Electronically Signed   By: Sharlet Salina M.D.   On: 08/21/2023 17:30   CT ANGIO HEAD NECK W WO CM (CODE STROKE) Result Date: 08/21/2023 CLINICAL DATA:  Neuro deficit, acute, stroke suspected EXAM: CT  ANGIOGRAPHY HEAD AND NECK WITH AND WITHOUT CONTRAST TECHNIQUE: Multidetector CT imaging of the head and neck was performed using the standard protocol during bolus administration of intravenous contrast. Multiplanar CT image reconstructions and MIPs were obtained to evaluate the vascular anatomy. Carotid stenosis measurements (when applicable) are obtained utilizing NASCET criteria, using the distal internal carotid diameter as the denominator. RADIATION DOSE REDUCTION: This exam was performed according to the departmental dose-optimization program which includes automated exposure control, adjustment of the mA and/or kV according to patient size and/or use of iterative reconstruction technique. CONTRAST:  Iodinated contrast was used to improve disease detection COMPARISON:  CT head 04/21/19 FINDINGS: CT HEAD FINDINGS See same day CT head for intracranial findings CTA NECK FINDINGS Aortic arch: Standard branching. Imaged portion shows no evidence of aneurysm or dissection. No significant stenosis of the major arch vessel origins. Right carotid system: No evidence of dissection, stenosis (50% or greater), or occlusion. Left carotid system: No evidence of dissection, stenosis (50% or greater), or occlusion. Vertebral arteries: Codominant. No evidence of dissection, stenosis (50% or greater), or occlusion. Skeleton: Negative. Other neck: Negative. Upper chest: Negative. Review of the MIP images confirms the above findings CTA HEAD FINDINGS Anterior circulation: No significant stenosis, proximal occlusion, aneurysm, or vascular malformation. Posterior circulation: No significant stenosis, proximal occlusion, aneurysm, or vascular malformation. Venous sinuses: See separately dictated CT venogram Anatomic variants: None Review of the MIP images confirms the above findings IMPRESSION: 1. No intracranial large vessel occlusion or significant stenosis. 2. No hemodynamically significant stenosis in the neck. 3. Poor  visualization of the vein of Trolard on the right. see separately dictated CT venogram for additional findings Electronically Signed   By: Lorenza Cambridge M.D.   On: 08/21/2023 17:04   CT HEAD CODE STROKE WO CONTRAST Result Date: 08/21/2023 CLINICAL DATA:  Code stroke.  Stroke-like symptoms EXAM: CT HEAD WITHOUT CONTRAST TECHNIQUE: Contiguous axial images were obtained from the base of the skull through the vertex without intravenous contrast. RADIATION DOSE REDUCTION: This exam was performed according to the departmental dose-optimization program which includes automated exposure control, adjustment of the mA and/or kV according to patient size and/or use of iterative reconstruction technique. COMPARISON:  None Available. FINDINGS: Brain: 3.9 x 2.4 x 4.1 cm parenchymal hemorrhage centered at the right frontoparietal junction. No evidence of intraventricular extension. No mass effect. No mass lesion no CT evidence of an acute cortical infarct. Vascular: No hyperdense vessel or unexpected calcification. Skull: Normal. Negative for fracture or focal lesion. Sinuses/Orbits: No middle ear or mastoid effusion. Paranasal sinuses are notable for polypoid mucosal thickening in the right maxillary sinus. Orbits are unremarkable. Other: None. ASPECTS [facility] Stroke Program Early CT Score): 10 IMPRESSION: 3.9 x 2.4 x 4.1 cm parenchymal hemorrhage centered at the right frontoparietal junction. No evidence of intraventricular extension. No mass effect. Findings were discussed with Dr. Wilford Corner on 08/21/23 at 4:50 PM. Electronically Signed   By: Lorenza Cambridge M.D.   On: 08/21/2023 16:53    Vitals:   08/22/23 1000 08/22/23 1100 08/22/23 1200 08/22/23 1300  BP: 119/84 124/81 121/75 125/74  Pulse: 83 (!) 54 (!) 54 75  Resp: 20 19 18 16   Temp:   98.2 F (36.8 C)   TempSrc:   Oral   SpO2: 98% 98% 94% 94%  Weight:      Height:         PHYSICAL EXAM General:  Alert, well-nourished, well-developed patient in no acute  distress Psych:  Mood and affect appropriate  for situation CV: Regular rate and rhythm on monitor Respiratory:  Regular, unlabored respirations on room air GI: Abdomen soft and nontender   NEURO:  Mental Status: AA&Ox3, patient is able to give clear and coherent history Speech/Language: speech is without dysarthria or aphasia.  Naming, repetition, fluency, and comprehension intact.  Cranial Nerves:  II: PERRL. Visual fields full.  III, IV, VI: EOMI. Eyelids elevate symmetrically.  V: Sensation is intact to light touch and symmetrical to face.  VII: Face is symmetrical resting and smiling VIII: hearing intact to voice. IX, X: Palate elevates symmetrically. Phonation is normal.  VW:[id number] shrug 5/5. XII: tongue is midline without fasciculations. Motor: Left arm 3/5, left leg 3/5 can briefly pick it up off the bed, right arm and leg 5/5 Tone: is normal and bulk is normal Sensation- Intact to light touch bilaterally. Extinction absent to light touch to DSS.   Coordination: FTN intact bilaterally, HKS: no ataxia in BLE.No drift.  Gait- deferred  Most Recent NIH  1a Level of Conscious.: 0 1b LOC Questions: 0 1c LOC Commands: 0 2 Best Gaze: 0 3 Visual: 0 4 Facial Palsy: 0 5a Motor Arm - left: 3 5b Motor Arm - Right: 0 6a Motor Leg - Left: 3 6b Motor Leg - Right: 0 7 Limb Ataxia: 3 8 Sensory: 0 9 Best Language: 0 10 Dysarthria: 0 11 Extinct. and Inatten.: 0 TOTAL: 6   ASSESSMENT/PLAN  [name]. is a 33 y.o. male with history of  mallory weiss tear in 2014, anxiety, ADHD, and GERD presenting with left side weakness. NIH on Admission 7  Intracerebral Hemorrhage:  right frontoparietal  Etiology: Unclear workup underway Code Stroke CT head ICH in  right frontoparietal junction. CTA head & neck no LVO CTV no evidence of dural venous sinus thrombosis MRI  Unchanged appearance of intraparenchymal hematoma within the superior right frontal lobe. No mass lesion.   2D Echo EF 55 to 60% Scheduled for cerebral angiogram tomorrow LDL 119 HgbA1c 5.3 VTE prophylaxis -SCDs No antithrombotic prior to admission, now on No antithrombotic due to acute ICH Therapy recommendations:  Pending Disposition: Pending   Hypertension Home meds: None Stable Not requiring any BP meds Blood Pressure Goal: SBP between 130-150 for 24 hours and then less than 160   Hyperlipidemia Home meds: None LDL 119, goal < 70 Consider adding statin at discharge  Tobacco Abuse Patient smokes half a pack per day for       Ready to quit? No Nicotine replacement therapy provided  Substance Abuse Patient uses Ativan and Percocet off the streets UDS positive for  [facility]      Ready to quit? No TOC consult for cessation placed  Dysphagia Patient has post-stroke dysphagia, SLP consulted    Diet   Diet regular Room service appropriate? Yes with Assist; Fluid consistency: Thin   Advance diet as tolerated  Other Stroke Risk Factors ETOH use, alcohol level <10, advised to drink no more than 2 drink(s) a day   Other Active Problems   Hospital day # 1   Gevena Mart DNP, ACNPC-AG  [facility]  ATTENDING ATTESTATION:  Erik Santana 33 years old with intracranial hemorrhage of unknown etiology.  He was not hypertensive on admission.  MRI with and without contrast is negative.  For this reason we will pursue further workup with catheter angiogram scheduled for tomorrow.  U-Tox is positive for marijuana.  He has a history of anxiety.  He takes opioid pain medication off the  streets as well as Xanax off the streets.  Will monitor for withdrawal protocol.  Dose of Ultram given along with muscle relaxers to help with his leg spasm.  Will try Atarax for his anxiety.  He refuses SSRIs.    Dr. Viviann Spare evaluated pt independently, reviewed imaging, chart, labs. Discussed and formulated plan with the Resident/APP. Changes were made to the note where appropriate. Please see  APP/resident note above for details.      This patient is critically ill due to intracranial hemorrhage and at significant risk of neurological worsening, death form heart failure, respiratory failure, recurrent stroke, bleeding from [facility], seizure, sepsis. This patient's care requires constant monitoring of vital signs, hemodynamics, respiratory and cardiac monitoring, review of multiple databases, neurological assessment, discussion with family, other specialists and medical decision making of high complexity. I spent 35 minutes of neurocritical care time in the care of this patient.   Erik Coach,MD    To contact Stroke Continuity provider, please refer to [email or web address]. After hours, contact General Neurology

## 2023-08-22 NOTE — Progress Notes (Signed)
 PT Cancellation Note  Patient Details Name: Erik Santana. MRN: [id number] DOB: 01/10/1991   Cancelled Treatment:    Reason Eval/Treat Not Completed: Active bedrest order  Lillia Pauls, PT, DPT Acute Rehabilitation Services Office [phone]    Norval Morton 08/22/2023, 7:47 AM

## 2023-08-22 NOTE — Evaluation (Signed)
 Physical Therapy Evaluation Patient Details Name: Erik Santana. MRN: [id number] DOB: August 09, 1990 Today's Date: 08/22/2023  History of Present Illness  33 yo male falling and hitting head. CT (+) R frontal lobe ICH NIH 7 PMH ADD anxiety GERD GIB smoker  Clinical Impression  Pt presents with L hemiplegia, L hypertonicity with LLE defaulting to extensor tone with sustained clonus, impaired sitting and standing balance, max difficulty mobilizing, and decreased activity tolerance. Pt to benefit from acute PT to address deficits. Pt requiring mod-max +2 assist for transfer-level mobility, and requires max PT effort to break LLE extensor tone in sitting and standing. Patient will benefit from intensive inpatient follow-up therapy, >3 hours/day. PT to progress mobility as tolerated, and will continue to follow acutely.          If plan is discharge home, recommend the following: Two people to help with walking and/or transfers;Two people to help with bathing/dressing/bathroom   Can travel by private vehicle        Equipment Recommendations None recommended by PT  Recommendations for Other Services       Functional Status Assessment Patient has had a recent decline in their functional status and demonstrates the ability to make significant improvements in function in a reasonable and predictable amount of time.     Precautions / Restrictions Precautions Precautions: Fall Precaution/Restrictions Comments: SBP 130-150 Restrictions Weight Bearing Restrictions Per Provider Order: No      Mobility  Bed Mobility Overal bed mobility: Needs Assistance Bed Mobility: Rolling, Sit to Supine, Supine to Sit Rolling: Mod assist   Supine to sit: Mod assist, HOB elevated Sit to supine: +2 for physical assistance, Max assist   General bed mobility comments: pt requires (A) To advance L LE to EOB and (A) to elevate trunk from surface. pt complaining of L hip pain noted to have large bruise and  states decreased pain in sitting with flexion    Transfers Overall transfer level: Needs assistance Equipment used: 2 person hand held assist Transfers: Sit to/from Stand Sit to Stand: +2 physical assistance, Mod assist           General transfer comment: +2 max to lateral step to the R side, PT facilitating upright trunk as pt with heavy L lateral bias, LLE progression during stepping. stand from EOB x2    Ambulation/Gait                  Stairs            Wheelchair Mobility     Tilt Bed    Modified Rankin (Stroke Patients Only) Modified Rankin (Stroke Patients Only) Pre-Morbid Rankin Score: No symptoms Modified Rankin: Severe disability     Balance Overall balance assessment: Needs assistance Sitting-balance support: Single extremity supported, Feet supported Sitting balance-Leahy Scale: Poor Sitting balance - Comments: mod truncal assist to correct L lateral bias Postural control: Left lateral lean Standing balance support: Bilateral upper extremity supported, During functional activity Standing balance-Leahy Scale: Zero Standing balance comment: mod-max +2                             Pertinent Vitals/Pain Pain Assessment Pain Assessment: 0-10 Pain Score: 8  Pain Location: left side hip area Pain Descriptors / Indicators: Shooting, Sharp Pain Intervention(s): Limited activity within patient's tolerance, Monitored during session, Repositioned    Home Living Family/patient expects to be discharged to:: Private residence Living Arrangements: Other relatives Available Help at Discharge:  Family Type of Home: House Home Access: Ramped entrance       Home Layout: One level Home Equipment: None Additional Comments: Grandfather, helps care for grandfather, working in [employer]    Prior Function Prior Level of Function : Independent/Modified Independent;Working/employed                     Extremity/Trunk Assessment    Upper Extremity Assessment Upper Extremity Assessment: Defer to OT evaluation LUE Deficits / Details: tone present holding thumb in extension, 2nd in extension and 3rd 4th 5th digit in flexion, wrist neutral elbow flexion adducted to chest. Pt able move entire arm into into neutral with increased time LUE Sensation: decreased light touch LUE Coordination: decreased fine motor;decreased gross motor    Lower Extremity Assessment Lower Extremity Assessment: LLE deficits/detail LLE Deficits / Details: clonus with extensor synergy, max difficulty to break into flexion. 1/5 hip abd/add but no other volitional movement appreciated, states "it feels like novocaine"    Cervical / Trunk Assessment Cervical / Trunk Assessment: Normal  Communication   Communication Communication: No apparent difficulties    Cognition Arousal: Alert Behavior During Therapy: WFL for tasks assessed/performed   PT - Cognitive impairments: No apparent impairments                         Following commands: Intact       Cueing       General Comments General comments (skin integrity, edema, etc.): SBP <150 throughout    Exercises Other Exercises Other Exercises: PNF D1 Flexion LLE pattern, x10   Assessment/Plan    PT Assessment Patient needs continued PT services  PT Problem List Decreased strength;Decreased mobility;Decreased safety awareness;Impaired tone;Decreased knowledge of precautions;Decreased activity tolerance;Decreased balance;Decreased coordination;Pain;Cardiopulmonary status limiting activity       PT Treatment Interventions Therapeutic activities;Gait training;DME instruction;Therapeutic exercise;Patient/family education;Balance training;Stair training;Functional mobility training;Neuromuscular re-education    PT Goals (Current goals can be found in the Care Plan section)  Acute Rehab PT Goals PT Goal Formulation: With patient Time For Goal Achievement: 09/05/23 Potential to  Achieve Goals: Good    Frequency Min 1X/week     Co-evaluation PT/OT/SLP Co-Evaluation/Treatment: Yes Reason for Co-Treatment: For patient/therapist safety;To address functional/ADL transfers PT goals addressed during session: Balance;Mobility/safety with mobility OT goals addressed during session: ADL's and self-care;Proper use of Adaptive equipment and DME;Strengthening/ROM       AM-PAC PT "6 Clicks" Mobility  Outcome Measure Help needed turning from your back to your side while in a flat bed without using bedrails?: A Lot Help needed moving from lying on your back to sitting on the side of a flat bed without using bedrails?: A Lot Help needed moving to and from a bed to a chair (including a wheelchair)?: Total Help needed standing up from a chair using your arms (e.g., wheelchair or bedside chair)?: Total Help needed to walk in hospital room?: Total Help needed climbing 3-5 steps with a railing? : Total 6 Click Score: 8    End of Session   Activity Tolerance: Patient limited by fatigue Patient left: in bed;with call bell/phone within reach;with bed alarm set;Other (comment) (with hip and knee positioned in flexion to avoid heavy extensor tone) Nurse Communication: Mobility status PT Visit Diagnosis: Other abnormalities of gait and mobility (R26.89);Muscle weakness (generalized) (M62.81);Hemiplegia and hemiparesis Hemiplegia - Right/Left: Left Hemiplegia - dominant/non-dominant: Non-dominant Hemiplegia - caused by: Nontraumatic intracerebral hemorrhage    Time: 1610-9604 PT Time Calculation (min) (ACUTE  ONLY): 29 min   Charges:   PT Evaluation $PT Eval Moderate Complexity: 1 Mod   PT General Charges $$ ACUTE PT VISIT: 1 Visit         Marye Round, PT DPT Acute Rehabilitation Services Secure Chat Preferred  Office [phone]   Cloie Wooden Sheliah Plane 08/22/2023, 3:51 PM

## 2023-08-22 NOTE — Evaluation (Signed)
 Occupational Therapy Evaluation Patient Details Name: Erik Santana. MRN: [id number] DOB: [birth year] Today's Date: 08/22/2023   History of Present Illness   33 yo male falling and hitting head. CT (+) R frontal lobe IPH NIH 7 PMH ADD anxiety GERD GIB smoker     Clinical Impressions PT admitted with R frontal IPH. Pt currently with functional limitiations due to the deficits listed below (see OT problem list). 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 Pt at this time with L side impairments. OT to observe L hand for splinting needs. Pt currently with increased tone and risk for pressure wound with splinting attempts.  Pt will benefit from skilled OT to increase their independence and safety with adls and balance to allow discharge Patient will benefit from intensive inpatient follow-up therapy, >3 hours/day  .      If plan is discharge home, recommend the following:         Functional Status Assessment   Patient has had a recent decline in their functional status and demonstrates the ability to make significant improvements in function in a reasonable and predictable amount of time.     Equipment Recommendations   BSC/3in1;Other (comment) (TBA)     Recommendations for Other Services   Rehab consult     Precautions/Restrictions   Precautions Precautions: Fall Precaution/Restrictions Comments: SBP 130-150 Restrictions Weight Bearing Restrictions Per Provider Order: No     Mobility Bed Mobility Overal bed mobility: Needs Assistance Bed Mobility: Rolling, Sit to Supine, Supine to Sit Rolling: Mod assist   Supine to sit: Mod assist, HOB elevated Sit to supine: +2 for physical assistance, Max assist   General bed mobility comments: pt requires (A) To advance L LE to EOB and (A) to elevate trunk from surface. pt complaining of L hip pain noted to have large bruise and states decreased pain in sitting with flexion    Transfers Overall  transfer level: Needs assistance Equipment used: 2 person hand held assist Transfers: Sit to/from Stand Sit to Stand: +2 physical assistance, Mod assist           General transfer comment: total +2 max to lateral step to the R side      Balance                                           ADL either performed or assessed with clinical judgement   ADL Overall ADL's : Needs assistance/impaired Eating/Feeding: Set up   Grooming: Minimal assistance   Upper Body Bathing: Moderate assistance   Lower Body Bathing: Maximal assistance   Upper Body Dressing : Moderate assistance   Lower Body Dressing: Maximal assistance                 General ADL Comments: pt with full L side extension that caused a clonus / tremor response     Vision Baseline Vision/History: 0 No visual deficits Ability to See in Adequate Light: 0 Adequate Patient Visual Report: No change from baseline Vision Assessment?: No apparent visual deficits     Perception         Praxis         Pertinent Vitals/Pain Pain Assessment Pain Assessment: 0-10 Pain Score: 8  Pain Location: left side hip area Pain Descriptors / Indicators: Shooting, Sharp Pain Intervention(s): Monitored during session, Repositioned, Limited activity within patient's tolerance  Extremity/Trunk Assessment Upper Extremity Assessment Upper Extremity Assessment: Right hand dominant;LUE deficits/detail LUE Deficits / Details: tone present holding thumb in extension, 2nd in extension and 3rd 4th 5th digit in flexion, wrist neutral elbow flexion adducted to chest. Pt able move entire arm into into neutral with increased time LUE Sensation: decreased light touch LUE Coordination: decreased fine motor;decreased gross motor   Lower Extremity Assessment Lower Extremity Assessment: Defer to PT evaluation;LLE deficits/detail LLE Deficits / Details: clonus at foot and full extension at knee ( tone)   Cervical /  Trunk Assessment Cervical / Trunk Assessment: Normal   Communication Communication Communication: No apparent difficulties   Cognition Arousal: Alert Behavior During Therapy: WFL for tasks assessed/performed Cognition: No apparent impairments                               Following commands: Intact       Cueing  General Comments          Exercises Exercises: Other exercises Other Exercises Other Exercises: PROM LUE ( hand wrist and digits)   Shoulder Instructions      Home Living Family/patient expects to be discharged to:: Private residence Living Arrangements: Other relatives Available Help at Discharge: Family Type of Home: House Home Access: Ramped entrance     Home Layout: One level     Bathroom Shower/Tub: Walk-in shower;Door   [employer] Toilet: Standard     Home Equipment: None   Additional Comments: Grandfather, helps care for grandfather, working in [employer]      Prior Functioning/Environment Prior Level of Function : Independent/Modified Independent;Working/employed                    OT Problem List: Decreased strength;Decreased range of motion;Decreased activity tolerance;Impaired balance (sitting and/or standing);Decreased coordination;Decreased safety awareness;Decreased knowledge of use of DME or AE;Decreased knowledge of precautions;Impaired sensation;Impaired tone;Impaired UE functional use   OT Treatment/Interventions: Self-care/ADL training;Therapeutic exercise;Neuromuscular education;Energy conservation;DME and/or AE instruction;Manual therapy;Modalities;Therapeutic activities;Balance training;Patient/family education      OT Goals(Current goals can be found in the care plan section)   Acute Rehab OT Goals Patient Stated Goal: none stated by patient OT Goal Formulation: With patient/family Time For Goal Achievement: 09/05/23 Potential to Achieve Goals: Good   OT Frequency:  Min 1X/week    Co-evaluation  PT/OT/SLP Co-Evaluation/Treatment: Yes Reason for Co-Treatment: For patient/therapist safety;To address functional/ADL transfers   OT goals addressed during session: ADL's and self-care;Proper use of Adaptive equipment and DME;Strengthening/ROM      AM-PAC OT "6 Clicks" Daily Activity     Outcome Measure Help from another person eating meals?: A Little Help from another person taking care of personal grooming?: A Little Help from another person toileting, which includes using toliet, bedpan, or urinal?: A Lot Help from another person bathing (including washing, rinsing, drying)?: A Lot Help from another person to put on and taking off regular upper body clothing?: A Lot Help from another person to put on and taking off regular lower body clothing?: A Little 6 Click Score: 15   End of Session Equipment Utilized During Treatment: Gait belt Nurse Communication: Mobility status;Precautions  Activity Tolerance: Patient tolerated treatment well Patient left: in bed;with call bell/phone within reach;with bed alarm set;with family/visitor present  OT Visit Diagnosis: Unsteadiness on feet (R26.81);Muscle weakness (generalized) (M62.81);Hemiplegia and hemiparesis Hemiplegia - Right/Left: Left Hemiplegia - dominant/non-dominant: Non-Dominant Hemiplegia - caused by: Unspecified  Time: 1610-9604 ([id number]) OT Time Calculation (min): 29 min Charges:  OT General Charges $OT Visit: 1 Visit OT Evaluation $OT Eval Moderate Complexity: 1 Mod   Brynn, OTR/L  Acute Rehabilitation Services Office: [phone] .   Mateo Flow 08/22/2023, 3:28 PM

## 2023-08-22 NOTE — Progress Notes (Signed)

## 2023-08-23 ENCOUNTER — Inpatient Hospital Stay (HOSPITAL_COMMUNITY): Payer: Self-pay

## 2023-08-23 HISTORY — PX: IR ANGIO VERTEBRAL SEL VERTEBRAL UNI R MOD SED: IMG5368

## 2023-08-23 HISTORY — PX: IR ANGIO INTRA EXTRACRAN SEL COM CAROTID INNOMINATE BILAT MOD SED: IMG5360

## 2023-08-23 HISTORY — PX: IR ANGIO VERTEBRAL SEL SUBCLAVIAN INNOMINATE UNI L MOD SED: IMG5364

## 2023-08-23 LAB — CBC
HCT: 46.7 % (ref 39.0–52.0)
Hemoglobin: 16.6 g/dL (ref 13.0–17.0)
MCH: 29.1 pg (ref 26.0–34.0)
MCHC: 35.5 g/dL (ref 30.0–36.0)
MCV: 81.9 fL (ref 80.0–100.0)
Platelets: 320 10*3/uL (ref 150–400)
RBC: 5.7 MIL/uL (ref 4.22–5.81)
RDW: 12.3 % (ref 11.5–15.5)
WBC: 9.5 10*3/uL (ref 4.0–10.5)
nRBC: 0 % (ref 0.0–0.2)

## 2023-08-23 LAB — BASIC METABOLIC PANEL
Anion gap: 7 (ref 5–15)
BUN: 9 mg/dL (ref 6–20)
CO2: 26 mmol/L (ref 22–32)
Calcium: 9.2 mg/dL (ref 8.9–10.3)
Chloride: 104 mmol/L (ref 98–111)
Creatinine, Ser: 0.98 mg/dL (ref 0.61–1.24)
GFR, Estimated: >60 mL/min (ref >60)
Glucose, Bld: 103 mg/dL — ABNORMAL HIGH (ref 70–99)
Potassium: 3.5 mmol/L (ref 3.5–5.1)
Sodium: 137 mmol/L (ref 135–145)

## 2023-08-23 MED ORDER — IOHEXOL 300 MG/ML  SOLN
150.0000 mL | Freq: Once | INTRAMUSCULAR | Status: AC | PRN
  Administered 2023-08-23: 128 mL via INTRA_ARTERIAL

## 2023-08-23 MED ORDER — NITROGLYCERIN 1 MG/10 ML FOR IR/CATH LAB
INTRA_ARTERIAL | Status: AC
  Filled 2023-08-23: qty 10

## 2023-08-23 MED ORDER — LIDOCAINE HCL 1 % IJ SOLN
20.0000 mL | Freq: Once | INTRAMUSCULAR | Status: AC
  Administered 2023-08-23: 5 mL
  Filled 2023-08-23: qty 20

## 2023-08-23 MED ORDER — MIDAZOLAM HCL 2 MG/2ML IJ SOLN
INTRAMUSCULAR | Status: AC
  Filled 2023-08-23: qty 2

## 2023-08-23 MED ORDER — FENTANYL CITRATE (PF) 100 MCG/2ML IJ SOLN
INTRAMUSCULAR | Status: AC
  Filled 2023-08-23: qty 2

## 2023-08-23 MED ORDER — MIDAZOLAM HCL 2 MG/2ML IJ SOLN
INTRAMUSCULAR | Status: AC | PRN
Start: 2023-08-23 — End: 2023-08-23
  Administered 2023-08-23: 1 mg via INTRAVENOUS
  Administered 2023-08-23: .5 mg via INTRAVENOUS

## 2023-08-23 MED ORDER — FENTANYL CITRATE (PF) 100 MCG/2ML IJ SOLN
INTRAMUSCULAR | Status: AC | PRN
  Administered 2023-08-23: 12.5 ug via INTRAVENOUS
  Administered 2023-08-23: 25 ug via INTRAVENOUS

## 2023-08-23 MED ORDER — VERAPAMIL HCL 2.5 MG/ML IV SOLN: INTRA_ARTERIAL | Status: AC | PRN

## 2023-08-23 MED ORDER — LIDOCAINE HCL 1 % IJ SOLN
INTRAMUSCULAR | Status: AC
  Filled 2023-08-23: qty 20

## 2023-08-23 MED ORDER — VERAPAMIL HCL 2.5 MG/ML IV SOLN
INTRAVENOUS | Status: AC
  Filled 2023-08-23: qty 2

## 2023-08-23 MED ORDER — ENOXAPARIN SODIUM 40 MG/0.4ML IJ SOSY
40.0000 mg | PREFILLED_SYRINGE | Freq: Every day | INTRAMUSCULAR | Status: DC
  Administered 2023-08-23: 40 mg via SUBCUTANEOUS
  Filled 2023-08-23: qty 0.4

## 2023-08-23 MED ORDER — SODIUM CHLORIDE 0.9 % IV SOLN: INTRAVENOUS | Status: AC

## 2023-08-23 MED ORDER — HEPARIN SODIUM (PORCINE) 1000 UNIT/ML IJ SOLN
INTRAMUSCULAR | Status: AC
  Filled 2023-08-23: qty 10

## 2023-08-23 NOTE — Progress Notes (Signed)
 PT Cancellation Note  Patient Details Name: Erik Santana. MRN: [id number] DOB: [birth year]   Cancelled Treatment:    Reason Eval/Treat Not Completed: Patient at procedure or test/unavailable - off floor at angiogram  Marye Round, PT DPT Acute Rehabilitation Services Secure Chat Preferred  Office [phone]    Erik Santana Sheliah Plane 08/23/2023, 2:03 PM

## 2023-08-23 NOTE — Progress Notes (Addendum)
 STROKE TEAM PROGRESS NOTE    SIGNIFICANT HOSPITAL EVENTS 2/23 presented with left-sided weakness, CT head with ICH and right frontoparietal junction.  Admitted to the ICU  INTERIM HISTORY/SUBJECTIVE Mom at bedside.  Xray of hip neg.    More calm today. Atarax and baclofen is helping.  cerebral angiogram today then transfer to floor.  OBJECTIVE  CBC    Component Value Date/Time   WBC 9.5 08/23/2023 0458   RBC 5.70 08/23/2023 0458   HGB 16.6 08/23/2023 0458   HCT 46.7 08/23/2023 0458   PLT 320 08/23/2023 0458   MCV 81.9 08/23/2023 0458   MCH 29.1 08/23/2023 0458   MCHC 35.5 08/23/2023 0458   RDW 12.3 08/23/2023 0458   LYMPHSABS 1.2 08/21/2023 1638   MONOABS 0.4 08/21/2023 1638   EOSABS 0.4 08/21/2023 1638   BASOSABS 0.1 08/21/2023 1638    BMET    Component Value Date/Time   NA 137 08/23/2023 0458   K 3.5 08/23/2023 0458   CL 104 08/23/2023 0458   CO2 26 08/23/2023 0458   GLUCOSE 103 (H) 08/23/2023 0458   BUN 9 08/23/2023 0458   CREATININE 0.98 08/23/2023 0458   CALCIUM 9.2 08/23/2023 0458   GFRNONAA >60 08/23/2023 0458    IMAGING past 24 hours DG HIP PORT UNILAT WITH PELVIS 1V LEFT Result Date: 08/22/2023 CLINICAL DATA:  Left hip pain. EXAM: DG HIP (WITH OR WITHOUT PELVIS) 1V PORT LEFT COMPARISON:  None Available. FINDINGS: Foreshortened appearance of the left femoral neck, likely positional. Repeat radiograph with better positioning of the patient or CT may provide better evaluation if there is high clinical concern for an acute fracture. No dislocation. The bones are well mineralized. No significant arthritic changes. The soft tissues are unremarkable. IMPRESSION: Foreshortened appearance of the left femoral neck, likely positional. No definite acute fracture. Electronically Signed   By: Elgie Collard M.D.   On: 08/22/2023 18:33   ECHOCARDIOGRAM COMPLETE Result Date: 08/22/2023    ECHOCARDIOGRAM REPORT   Patient Name:   Erik Santana. Date of Exam:  08/22/2023 Medical Rec #:  [id number]         Height:       73.0 in Accession #:    [id number]        Weight:       132.1 lb Date of Birth:  1991-04-03        BSA:          1.804 m Patient Age:    33 years          BP:           124/80 mmHg Patient Gender: M                 HR:           55 bpm. Exam Location:  Inpatient Procedure: 2D Echo, Cardiac Doppler and Color Doppler (Both Spectral and Color            Flow Doppler were utilized during procedure). Indications:    Stroke I63.9  History:        Patient has no prior history of Echocardiogram examinations.  Sonographer:    Lucendia Herrlich RCS Referring Phys: [id number] ASHISH ARORA IMPRESSIONS  1. Left ventricular ejection fraction, by estimation, is 55 to 60%. The left ventricle has normal function. The left ventricle has no regional wall motion abnormalities. Left ventricular diastolic parameters are indeterminate.  2. Right ventricular systolic function is normal. The right ventricular size is normal.  3. The mitral valve is normal in structure. No evidence of mitral valve regurgitation. No evidence of mitral stenosis.  4. The aortic valve is tricuspid. There is mild calcification of the aortic valve. Aortic valve regurgitation is not visualized. No aortic stenosis is present.  5. The inferior vena cava is dilated in size with >50% respiratory variability, suggesting right atrial pressure of 8 mmHg. FINDINGS  Left Ventricle: Left ventricular ejection fraction, by estimation, is 55 to 60%. The left ventricle has normal function. The left ventricle has no regional wall motion abnormalities. Strain imaging was not performed. The left ventricular internal cavity  size was normal in size. There is no left ventricular hypertrophy. Left ventricular diastolic parameters are indeterminate. Right Ventricle: The right ventricular size is normal. No increase in right ventricular wall thickness. Right ventricular systolic function is normal. Left Atrium: Left atrial size was  normal in size. Right Atrium: Right atrial size was normal in size. Pericardium: There is no evidence of pericardial effusion. Mitral Valve: The mitral valve is normal in structure. No evidence of mitral valve regurgitation. No evidence of mitral valve stenosis. Tricuspid Valve: The tricuspid valve is normal in structure. Tricuspid valve regurgitation is trivial. No evidence of tricuspid stenosis. Aortic Valve: The aortic valve is tricuspid. There is mild calcification of the aortic valve. Aortic valve regurgitation is not visualized. No aortic stenosis is present. Aortic valve peak gradient measures 5.7 mmHg. Pulmonic Valve: The pulmonic valve was normal in structure. Pulmonic valve regurgitation is not visualized. No evidence of pulmonic stenosis. Aorta: The aortic root is normal in size and structure. Venous: The inferior vena cava is dilated in size with greater than 50% respiratory variability, suggesting right atrial pressure of 8 mmHg. IAS/Shunts: No atrial level shunt detected by color flow Doppler. Additional Comments: 3D imaging was not performed.  LEFT VENTRICLE PLAX 2D LVIDd:         5.10 cm   Diastology LVIDs:         3.30 cm   LV e' medial:    16.40 cm/s LV PW:         0.70 cm   LV E/e' medial:  5.2 LV IVS:        0.80 cm   LV e' lateral:   24.20 cm/s LVOT diam:     2.30 cm   LV E/e' lateral: 3.5 LV SV:         81 LV SV Index:   45 LVOT Area:     4.15 cm  RIGHT VENTRICLE             IVC RV S prime:     15.10 cm/s  IVC diam: 2.60 cm TAPSE (M-mode): 2.6 cm LEFT ATRIUM             Index        RIGHT ATRIUM           Index LA diam:        2.50 cm 1.39 cm/m   RA Area:     14.00 cm LA Vol (A2C):   35.6 ml 19.74 ml/m  RA Volume:   33.00 ml  18.30 ml/m LA Vol (A4C):   36.9 ml 20.46 ml/m LA Biplane Vol: 39.9 ml 22.12 ml/m  AORTIC VALVE AV Area (Vmax): 3.31 cm AV Vmax:        119.00 cm/s AV Peak Grad:   5.7 mmHg LVOT Vmax:      94.80 cm/s LVOT Vmean:     55.300  cm/s LVOT VTI:       0.194 m  AORTA Ao Root  diam: 3.50 cm MITRAL VALVE MV Area (PHT): 3.42 cm    SHUNTS MV Decel Time: 222 msec    Systemic VTI:  0.19 m MV E velocity: 85.50 cm/s  Systemic Diam: 2.30 cm MV A velocity: 37.20 cm/s MV E/A ratio:  2.30 Arvilla Meres MD Electronically signed by Arvilla Meres MD Signature Date/Time: 08/22/2023/12:02:46 PM    Final     Vitals:   08/23/23 0500 08/23/23 0600 08/23/23 0700 08/23/23 0800  BP: 114/71 105/75 114/80 127/86  Pulse: 63 (!) 47 (!) 44 60  Resp: 18 (!) 21 18 12   Temp:    98.3 F (36.8 C)  TempSrc:    Oral  SpO2: 98% 95% 94% 96%  Weight:      Height:         PHYSICAL EXAM General:  Alert, well-nourished, well-developed patient in no acute distress Psych:  Mood and affect appropriate for situation CV: Regular rate and rhythm on monitor Respiratory:  Regular, unlabored respirations on room air GI: Abdomen soft and nontender   NEURO:  Mental Status: AA&Ox3, patient is able to give clear and coherent history Speech/Language: speech is without dysarthria or aphasia.  Naming, repetition, fluency, and comprehension intact.  Cranial Nerves:  II: PERRL. Visual fields full.  III, IV, VI: EOMI. Eyelids elevate symmetrically.  V: Sensation is intact to light touch and symmetrical to face.  VII: Face is symmetrical resting and smiling VIII: hearing intact to voice. IX, X: Palate elevates symmetrically. Phonation is normal.  ZO:[id number] shrug 5/5. XII: tongue is midline without fasciculations. Motor: Left arm 3/5, left leg 3/5 can briefly pick it up off the bed, right arm and leg 5/5 Tone: is normal and bulk is normal Sensation- Intact to light touch bilaterally. Extinction absent to light touch to DSS.   Coordination: FTN intact bilaterally, HKS: no ataxia in BLE.No drift.  Gait- deferred  Most Recent NIH  1a Level of Conscious.: 0 1b LOC Questions: 0 1c LOC Commands: 0 2 Best Gaze: 0 3 Visual: 0 4 Facial Palsy: 0 5a Motor Arm - left: 3 5b Motor Arm - Right: 0 6a  Motor Leg - Left: 3 6b Motor Leg - Right: 0 7 Limb Ataxia: 3 8 Sensory: 0 9 Best Language: 0 10 Dysarthria: 0 11 Extinct. and Inatten.: 0 TOTAL: 6   ASSESSMENT/PLAN  [name]. is a 33 y.o. male with history of  mallory weiss tear in 2014, anxiety, ADHD, and GERD presenting with left side weakness. NIH on Admission 7  Intracerebral Hemorrhage:  right frontoparietal  Etiology: Unclear workup underway Code Stroke CT head ICH in  right frontoparietal junction. CTA head & neck no LVO CTV no evidence of dural venous sinus thrombosis MRI  Unchanged appearance of intraparenchymal hematoma within the superior right frontal lobe. No mass lesion.  2D Echo EF 55 to 60% Scheduled for cerebral angiogram today LDL 119 HgbA1c 5.3 VTE prophylaxis -SCDs No antithrombotic prior to admission, now on No antithrombotic due to acute ICH Therapy recommendations:  Pending Disposition: Pending   Hypertension Home meds: None Stable Not requiring any BP meds Blood Pressure Goal: SBP between 130-150 for 24 hours and then less than 160   Hyperlipidemia Home meds: None LDL 119, goal < 70 Consider adding statin at discharge  Tobacco Abuse Patient smokes half a pack per day for       Ready to quit? No  Nicotine replacement therapy provided  Substance Abuse Patient uses Ativan and Percocet off the streets UDS positive for  [facility]      Ready to quit? No TOC consult for cessation placed  Dysphagia Patient has post-stroke dysphagia, SLP consulted    Diet   Diet NPO time specified Except for: Sips with Meds   Advance diet as tolerated  Other Stroke Risk Factors ETOH use, alcohol level <10, advised to drink no more than 2 drink(s) a day   Other Active Problems   Hospital day # 2  Angio today if neg, transfer to floor later today if stable.  Labs stable.  Addendum: Angio is negative.  Transfer to floor bed to hospitalist team  Neuro will continue to follow   This patient  is critically ill due to intracranial hemorrhage and at significant risk of neurological worsening, death form heart failure, respiratory failure, recurrent stroke, bleeding from [facility], seizure, sepsis. This patient's care requires constant monitoring of vital signs, hemodynamics, respiratory and cardiac monitoring, review of multiple databases, neurological assessment, discussion with family, other specialists and medical decision making of high complexity. I spent 35 minutes of neurocritical care time in the care of this patient.   Brendan Gadson,MD    To contact Stroke Continuity provider, please refer to [email or web address]. After hours, contact General Neurology

## 2023-08-23 NOTE — Evaluation (Signed)
 Speech Language Pathology Evaluation Patient Details Name: Erik Santana. MRN: [id number] DOB: [birth year] Today's Date: 08/23/2023 Time: 4098-1191 SLP Time Calculation (min) (ACUTE ONLY): 32 min  Problem List:  Patient Active Problem List   Diagnosis Date Noted   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   Anxiety state 10/27/2012   Hematemesis 10/18/2012   Other esophagitis 10/18/2012   Mallory-Weiss tear 10/18/2012   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   Past Surgical History:  Past Surgical History:  Procedure Laterality Date   ESOPHAGOGASTRODUODENOSCOPY N/A 10/18/2012   Procedure: ESOPHAGOGASTRODUODENOSCOPY (EGD);  Surgeon: Louis Meckel, MD;  Location: [facility];  Service: Endoscopy;  Laterality: N/A;   ESOPHAGOGASTRODUODENOSCOPY (EGD) WITH PROPOFOL N/A 10/18/2012   Procedure: ESOPHAGOGASTRODUODENOSCOPY (EGD) WITH PROPOFOL;  Surgeon: Louis Meckel, MD;  Location: [facility];  Service: Endoscopy;  Laterality: N/A;   FINGER SURGERY     OPEN REDUCTION INTERNAL FIXATION (ORIF) PROXIMAL PHALANX Right 10/12/2022   Procedure: Right ring finger middle phalanx and proximal interphalangeal joint closed reduction internal percutaneous pinning;  Surgeon: Gomez Cleverly, MD;  Location: [facility];  Service: Orthopedics;  Laterality: Right;   HPI:  Pt is a 33 y.o. male who was admitted for stroke on 02/23. CT on 02/23 displayed "3.9 x 2.4 x 4.1 cm parenchymal hemorrhage centered at the right frontoparietal junction." MRI on 02/24 further confirmed this finding. PMHx: ADHD, anxiety, GERD, GIB smoker.   Assessment / Plan / Recommendation Clinical Impression  Pt was seen for SLE to determine goals for intervention. Pt presents with mild cognitive-communication deficits characterized by mild monotone  speech and decreased recall of new information.   Pt and pt's sister note c/o reduced speech expression, especially in stress and tone of words. Pt feels that he "used words better" before coming to hospital; pt meant that he used to be more descriptive and creative with words. Pt states that he is not having word-finding difficulties. Pt prosody during session was characterized by mild monotone quality. Decreased recall was noted during memory task where pt required multiple verbal cues. Visuo-spatial deficits were noted during clock drawing task with right-side preference associated with left-side neglect. Pt states that he has not noticed difficulties with memory or other "thinking skills". Pt used humor appropriately throughout session and did not have flat facial affect.   Recommend SLP f/u to address pragmatic concerns and to establish memory aids to optimize pt recovery.    SLP Assessment  SLP Recommendation/Assessment: Patient needs continued Speech Lanaguage Pathology Services SLP Visit Diagnosis: Cognitive communication deficit (R41.841)    Recommendations for follow up therapy are one component of a multi-disciplinary discharge planning process, led by the attending physician.  Recommendations may be updated based on patient status, additional functional criteria and insurance authorization.    Follow Up Recommendations  Follow physician's recommendations for discharge plan and follow up therapies    Assistance Recommended at Discharge  PRN  Functional Status Assessment Patient has had a recent decline in their functional status and demonstrates the ability to make significant improvements in function in a reasonable and predictable amount of time.  Frequency and Duration min 1 x/week  2 weeks      SLP Evaluation Cognition  Overall Cognitive Status: Impaired/Different from baseline Arousal/Alertness: Awake/alert Orientation Level: Oriented to person;Oriented to place;Oriented to  situation;Disoriented  to time Year: 2025 Month: February Day of Week: Incorrect Memory: Impaired Memory Impairment: Decreased recall of new information       Comprehension  Visual Recognition/Discrimination Discrimination: Exceptions to [facility] Common Objects: Other (comment) (Pt looked at clock in room during clock drawing task and still neglected the left side of his clock.) Reading Comprehension Reading Status: Not tested    Expression Expression Primary Mode of Expression: Verbal Verbal Expression Overall Verbal Expression: Appears within functional limits for tasks assessed Common Objects: Other (comment) (Pt looked at clock in room during clock drawing task and still neglected the left side of his clock.) Pragmatics: Impairment Impairments: Monotone Written Expression Written Expression: Not tested   Oral / Motor  Oral Motor/Sensory Function Overall Oral Motor/Sensory Function: Within functional limits Motor Speech Overall Motor Speech: Appears within functional limits for tasks assessed            Rowe Robert 08/23/2023, 10:29 AM

## 2023-08-23 NOTE — Procedures (Signed)
 INR.  Status post bilateral common carotid, right vertebral artery and left subclavian arteriograms.  Right radial approach.  Findings.  1.  No gross abnormalities identified in terms of aneurysms, dural AV fistula, A/V shunting, dissections, or  intraluminal filling defects.  2.  Dural venous sinuses grossly patent.  Study somewhat marred  by motion artifact.  Fatima Sanger MD.

## 2023-08-23 NOTE — Progress Notes (Signed)
 Inpatient Rehab Admissions Coordinator:    I met with pt. And mother to discuss potential CIR admit. They are interested and she states that she or other family can provide 24/7 supervision to min A at discharge. I will follow for potential admit once medically ready. They also confirmed that Pt is in fact uninsured and would like to be screened for medicaid.   Megan Salon, MS, CCC-SLP Rehab Admissions Coordinator  [phone] (celll) [phone] (office)

## 2023-08-23 NOTE — Plan of Care (Signed)
  Problem: Intracerebral Hemorrhage Tissue Perfusion: Goal: Complications of Intracerebral Hemorrhage will be minimized Outcome: Progressing   Problem: Coping: Goal: Will verbalize positive feelings about self Outcome: Progressing Goal: Will identify appropriate support needs Outcome: Progressing   Problem: Self-Care: Goal: Ability to participate in self-care as condition permits will improve Outcome: Progressing

## 2023-08-24 ENCOUNTER — Other Ambulatory Visit: Payer: Self-pay

## 2023-08-24 ENCOUNTER — Inpatient Hospital Stay (HOSPITAL_COMMUNITY)
Admission: AD | Admit: 2023-08-24 | Discharge: 2023-09-22 | DRG: 057 | Disposition: A | Discharge status: Home / Self Care | Payer: Self-pay | Source: Intra-hospital | Attending: Physical Medicine and Rehabilitation | Admitting: Physical Medicine and Rehabilitation

## 2023-08-24 ENCOUNTER — Encounter (HOSPITAL_COMMUNITY): Payer: Self-pay | Admitting: Physical Medicine and Rehabilitation

## 2023-08-24 DIAGNOSIS — M5441 Lumbago with sciatica, right side: Secondary | ICD-10-CM | POA: Diagnosis present

## 2023-08-24 DIAGNOSIS — K219 Gastro-esophageal reflux disease without esophagitis: Secondary | ICD-10-CM | POA: Diagnosis present

## 2023-08-24 DIAGNOSIS — R001 Bradycardia, unspecified: Secondary | ICD-10-CM | POA: Diagnosis present

## 2023-08-24 DIAGNOSIS — I454 Nonspecific intraventricular block: Secondary | ICD-10-CM | POA: Diagnosis present

## 2023-08-24 DIAGNOSIS — F410 Panic disorder [episodic paroxysmal anxiety] without agoraphobia: Secondary | ICD-10-CM | POA: Diagnosis present

## 2023-08-24 DIAGNOSIS — I618 Other nontraumatic intracerebral hemorrhage: Secondary | ICD-10-CM

## 2023-08-24 DIAGNOSIS — I69354 Hemiplegia and hemiparesis following cerebral infarction affecting left non-dominant side: Principal | ICD-10-CM

## 2023-08-24 DIAGNOSIS — G8929 Other chronic pain: Secondary | ICD-10-CM | POA: Diagnosis present

## 2023-08-24 DIAGNOSIS — F17210 Nicotine dependence, cigarettes, uncomplicated: Secondary | ICD-10-CM | POA: Diagnosis present

## 2023-08-24 DIAGNOSIS — S06339A Contusion and laceration of cerebrum, unspecified, with loss of consciousness of unspecified duration, initial encounter: Secondary | ICD-10-CM | POA: Diagnosis not present

## 2023-08-24 DIAGNOSIS — G253 Myoclonus: Secondary | ICD-10-CM | POA: Diagnosis present

## 2023-08-24 DIAGNOSIS — S06313A Contusion and laceration of right cerebrum with loss of consciousness of 1 hour to 5 hours 59 minutes, initial encounter: Principal | ICD-10-CM

## 2023-08-24 DIAGNOSIS — G4700 Insomnia, unspecified: Secondary | ICD-10-CM | POA: Diagnosis present

## 2023-08-24 DIAGNOSIS — R4189 Other symptoms and signs involving cognitive functions and awareness: Secondary | ICD-10-CM | POA: Diagnosis not present

## 2023-08-24 DIAGNOSIS — M5442 Lumbago with sciatica, left side: Secondary | ICD-10-CM | POA: Diagnosis present

## 2023-08-24 DIAGNOSIS — Z79899 Other long term (current) drug therapy: Secondary | ICD-10-CM

## 2023-08-24 DIAGNOSIS — Z91040 Latex allergy status: Secondary | ICD-10-CM

## 2023-08-24 DIAGNOSIS — I10 Essential (primary) hypertension: Secondary | ICD-10-CM | POA: Diagnosis present

## 2023-08-24 DIAGNOSIS — G4730 Sleep apnea, unspecified: Secondary | ICD-10-CM | POA: Diagnosis present

## 2023-08-24 DIAGNOSIS — D75839 Thrombocytosis, unspecified: Secondary | ICD-10-CM | POA: Diagnosis not present

## 2023-08-24 DIAGNOSIS — K5900 Constipation, unspecified: Secondary | ICD-10-CM | POA: Diagnosis present

## 2023-08-24 DIAGNOSIS — I612 Nontraumatic intracerebral hemorrhage in hemisphere, unspecified: Secondary | ICD-10-CM | POA: Diagnosis not present

## 2023-08-24 DIAGNOSIS — Z886 Allergy status to analgesic agent status: Secondary | ICD-10-CM

## 2023-08-24 DIAGNOSIS — J45909 Unspecified asthma, uncomplicated: Secondary | ICD-10-CM | POA: Diagnosis present

## 2023-08-24 DIAGNOSIS — F419 Anxiety disorder, unspecified: Secondary | ICD-10-CM | POA: Diagnosis present

## 2023-08-24 DIAGNOSIS — R0981 Nasal congestion: Secondary | ICD-10-CM | POA: Diagnosis present

## 2023-08-24 DIAGNOSIS — E739 Lactose intolerance, unspecified: Secondary | ICD-10-CM | POA: Diagnosis present

## 2023-08-24 DIAGNOSIS — R197 Diarrhea, unspecified: Secondary | ICD-10-CM | POA: Diagnosis present

## 2023-08-24 DIAGNOSIS — R42 Dizziness and giddiness: Secondary | ICD-10-CM | POA: Diagnosis not present

## 2023-08-24 DIAGNOSIS — S06310S Contusion and laceration of right cerebrum without loss of consciousness, sequela: Secondary | ICD-10-CM | POA: Diagnosis not present

## 2023-08-24 DIAGNOSIS — K5901 Slow transit constipation: Secondary | ICD-10-CM | POA: Diagnosis not present

## 2023-08-24 DIAGNOSIS — S0633AA Contusion and laceration of cerebrum, unspecified, with loss of consciousness status unknown, initial encounter: Principal | ICD-10-CM | POA: Diagnosis present

## 2023-08-24 LAB — CBC
HCT: 45 % (ref 39.0–52.0)
Hemoglobin: 16 g/dL (ref 13.0–17.0)
MCH: 29.3 pg (ref 26.0–34.0)
MCHC: 35.6 g/dL (ref 30.0–36.0)
MCV: 82.4 fL (ref 80.0–100.0)
Platelets: 317 10*3/uL (ref 150–400)
RBC: 5.46 MIL/uL (ref 4.22–5.81)
RDW: 12.4 % (ref 11.5–15.5)
WBC: 8.9 10*3/uL (ref 4.0–10.5)
nRBC: 0 % (ref 0.0–0.2)

## 2023-08-24 LAB — BASIC METABOLIC PANEL
Anion gap: 10 (ref 5–15)
BUN: 9 mg/dL (ref 6–20)
CO2: 22 mmol/L (ref 22–32)
Calcium: 9.2 mg/dL (ref 8.9–10.3)
Chloride: 104 mmol/L (ref 98–111)
Creatinine, Ser: 0.87 mg/dL (ref 0.61–1.24)
GFR, Estimated: >60 mL/min (ref >60)
Glucose, Bld: 103 mg/dL — ABNORMAL HIGH (ref 70–99)
Potassium: 3.2 mmol/L — ABNORMAL LOW (ref 3.5–5.1)
Sodium: 136 mmol/L (ref 135–145)

## 2023-08-24 MED ORDER — HYDROXYZINE HCL 25 MG PO TABS
50.0000 mg | ORAL_TABLET | Freq: Four times a day (QID) | ORAL | Status: DC | PRN
  Administered 2023-08-24 (×2): 50 mg via ORAL
  Filled 2023-08-24 (×2): qty 2

## 2023-08-24 MED ORDER — HYDROXYZINE HCL 50 MG PO TABS: 50.0000 mg | ORAL_TABLET | Freq: Four times a day (QID) | ORAL | 0 refills | Status: DC | PRN

## 2023-08-24 MED ORDER — ACETAMINOPHEN 160 MG/5ML PO SOLN: 650.0000 mg | ORAL | Status: DC | PRN

## 2023-08-24 MED ORDER — ACETAMINOPHEN 325 MG PO TABS
650.0000 mg | ORAL_TABLET | ORAL | Status: DC | PRN
  Administered 2023-08-25: 650 mg via ORAL
  Filled 2023-08-24: qty 2

## 2023-08-24 MED ORDER — BACLOFEN 10 MG PO TABS
10.0000 mg | ORAL_TABLET | Freq: Two times a day (BID) | ORAL | Status: DC
  Administered 2023-08-24 – 2023-08-25 (×2): 10 mg via ORAL
  Filled 2023-08-24 (×2): qty 1

## 2023-08-24 MED ORDER — NICOTINE 14 MG/24HR TD PT24
14.0000 mg | MEDICATED_PATCH | Freq: Every day | TRANSDERMAL | Status: DC
  Administered 2023-08-25 – 2023-08-26 (×2): 14 mg via TRANSDERMAL
  Filled 2023-08-24 (×2): qty 1

## 2023-08-24 MED ORDER — ENOXAPARIN SODIUM 40 MG/0.4ML IJ SOSY: 40.0000 mg | PREFILLED_SYRINGE | INTRAMUSCULAR | Status: DC

## 2023-08-24 MED ORDER — SENNOSIDES-DOCUSATE SODIUM 8.6-50 MG PO TABS
1.0000 | ORAL_TABLET | Freq: Two times a day (BID) | ORAL | Status: DC
  Administered 2023-08-24 – 2023-08-25 (×2): 1 via ORAL
  Filled 2023-08-24 (×2): qty 1

## 2023-08-24 MED ORDER — ENOXAPARIN SODIUM 40 MG/0.4ML IJ SOSY
40.0000 mg | PREFILLED_SYRINGE | Freq: Every day | INTRAMUSCULAR | Status: DC
  Administered 2023-08-24 – 2023-08-25 (×2): 40 mg via SUBCUTANEOUS
  Filled 2023-08-24 (×3): qty 0.4

## 2023-08-24 MED ORDER — ACETAMINOPHEN 650 MG RE SUPP: 650.0000 mg | RECTAL | Status: DC | PRN

## 2023-08-24 MED ORDER — HYDROXYZINE HCL 25 MG PO TABS
50.0000 mg | ORAL_TABLET | Freq: Four times a day (QID) | ORAL | Status: DC | PRN
  Administered 2023-08-24 – 2023-09-05 (×16): 50 mg via ORAL
  Filled 2023-08-24 (×16): qty 2

## 2023-08-24 MED ORDER — POTASSIUM CHLORIDE 20 MEQ PO PACK
40.0000 meq | PACK | Freq: Once | ORAL | Status: AC
  Administered 2023-08-24: 40 meq via ORAL
  Filled 2023-08-24: qty 2

## 2023-08-24 MED ORDER — BACLOFEN 10 MG PO TABS
10.0000 mg | ORAL_TABLET | Freq: Two times a day (BID) | ORAL | 0 refills | Status: DC
Start: 2023-08-24 — End: 2023-09-22

## 2023-08-24 MED ORDER — NICOTINE 14 MG/24HR TD PT24: 14.0000 mg | MEDICATED_PATCH | Freq: Every day | TRANSDERMAL | 0 refills | Status: DC

## 2023-08-24 NOTE — H&P (Signed)
 Physical Medicine and Rehabilitation Admission H&P    CC: parenchymal hemorrhage right frontal parietal  HPI: Erik Santana, [place]. 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  Patient does endorse using Ativan and Percocet off the streets.  Per chart review patient independent prior to admission working in [employer].  1 level home with a ramped entrance.  He does help provide care for his grandfather.  Mother and family plans to assist as needed on discharge.  Presented 08/21/2023 with acute onset of left-sided weakness as well as dizziness and headache.  He was able to call EMS himself.  EMS found him on the floor.  Blood pressure 130/81.  CT of the head showed a 3.9 x 2.4 x 4.1 cm parenchymal hemorrhage centered at the right frontal parietal junction.  No evidence of intraventricular extension no mass effect.  CT cervical spine negative.  CTA showed no intracranial large vessel occlusion or significant stenosis.  Echocardiogram with ejection fraction of 55 to 60% no wall motion abnormalities.  Admission chemistries unremarkable except glucose 157, urine drug screen positive marijuana.  MRI follow-up showed unchanged appearance of intraparenchymal hematoma within the superior right frontal lobe.  No mass lesion.  Arteriogram completed per interventional radiology showing no gross abnormalities.  Neurology continues to follow with conservative care.  Lovenox was initiated for DVT prophylaxis 08/23/2023.  Close monitoring of blood pressure.  He has not required Cleviprex.  [name] initiated for tobacco use.  Therapy evaluations completed due to patient's decreased functional mobility was admitted for a comprehensive rehab program.  Review of Systems  Constitutional:  Negative for fever.  HENT:  Negative for hearing loss.   Eyes:  Negative for blurred vision.  Respiratory:  Negative for cough,  shortness of breath and wheezing.   Cardiovascular:  Negative for chest pain, palpitations and leg swelling.  Gastrointestinal:  Positive for constipation. Negative for heartburn, nausea and vomiting.  Genitourinary:  Negative for dysuria, flank pain and hematuria.  Musculoskeletal:  Positive for myalgias.  Skin:  Negative for rash.  Neurological:  Positive for dizziness, weakness and headaches.  All other systems reviewed and are negative.  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   Past Surgical History:  Procedure Laterality Date   ESOPHAGOGASTRODUODENOSCOPY N/A 10/18/2012   Procedure: ESOPHAGOGASTRODUODENOSCOPY (EGD);  Surgeon: Louis Meckel, MD;  Location: [facility];  Service: Endoscopy;  Laterality: N/A;   ESOPHAGOGASTRODUODENOSCOPY (EGD) WITH PROPOFOL N/A 10/18/2012   Procedure: ESOPHAGOGASTRODUODENOSCOPY (EGD) WITH PROPOFOL;  Surgeon: Louis Meckel, MD;  Location: [facility];  Service: Endoscopy;  Laterality: N/A;   FINGER SURGERY     OPEN REDUCTION INTERNAL FIXATION (ORIF) PROXIMAL PHALANX Right 10/12/2022   Procedure: Right ring finger middle phalanx and proximal interphalangeal joint closed reduction internal percutaneous pinning;  Surgeon: Gomez Cleverly, MD;  Location: [facility];  Service: Orthopedics;  Laterality: Right;   Family History  Problem Relation Age of Onset   Healthy Mother    Healthy Father    Social History:  reports that he has been smoking cigarettes. He has never used smokeless tobacco. He reports current alcohol use. He reports that he does not use drugs. Allergies:  Allergies  Allergen Reactions   Latex Hives   Nsaids Other (See Comments)  History of a Mallory-Weiss tear (had surgery to fix this 10+ years ago)   Lactose Intolerance (Gi) Diarrhea   Medications Prior to Admission  Medication Sig  Dispense Refill   Ibuprofen (ADVIL) 200 MG CAPS Take by mouth.     NYQUIL SEVERE COLD/FLU 5-6.25-10-325 MG/15ML LIQD Take 15-30 mLs by mouth every 6 (six) hours as needed (for cold-like symptoms).     TYLENOL 500 MG tablet Take 500-1,000 mg by mouth every 6 (six) hours as needed for mild pain (pain score 1-3) or headache.       Home: Home Living Family/patient expects to be discharged to:: Private residence Living Arrangements: Other relatives Available Help at Discharge: Family Type of Home: House Home Access: Ramped entrance Home Layout: One level Bathroom Shower/Tub: Psychologist, counselling, Door Bathroom Toilet: Standard Bathroom Accessibility: Yes Home Equipment: None Additional Comments: Grandfather, helps care for grandfather, working in [employer]  Lives With: Spouse   Functional History: Prior Function Prior Level of Function : Independent/Modified Independent, Working/employed   Functional Status:  Mobility: Bed Mobility Overal bed mobility: Needs Assistance Bed Mobility: Rolling, Sit to Supine, Supine to Sit Rolling: Mod assist Supine to sit: Mod assist, HOB elevated Sit to supine: +2 for physical assistance, Max assist General bed mobility comments: pt requires (A) To advance L LE to EOB and (A) to elevate trunk from surface. pt complaining of L hip pain noted to have large bruise and states decreased pain in sitting with flexion Transfers Overall transfer level: Needs assistance Equipment used: 2 person hand held assist Transfers: Sit to/from Stand Sit to Stand: +2 physical assistance, Mod assist General transfer comment: +2 max to lateral step to the R side, PT facilitating upright trunk as pt with heavy L lateral bias, LLE progression during stepping. stand from EOB x2   ADL: ADL Overall ADL's : Needs assistance/impaired Eating/Feeding: Set up Grooming: Minimal assistance Upper Body Bathing: Moderate assistance Lower Body Bathing: Maximal assistance Upper Body  Dressing : Moderate assistance Lower Body Dressing: Maximal assistance General ADL Comments: pt with full L side extension that caused a clonus / tremor response   Cognition: Cognition Overall Cognitive Status: Impaired/Different from baseline Arousal/Alertness: Awake/alert Orientation Level: Oriented X4 Year: 2025 Month: February Day of Week: Incorrect Memory: Impaired Memory Impairment: Decreased recall of new information Cognition Arousal: Alert Behavior During Therapy: WFL for tasks assessed/performed Overall Cognitive Status: Impaired/Different from baseline    Physical Exam: There were no vitals taken for this visit. Physical Exam Gen: no distress, normal appearing HEENT: oral mucosa pink and moist, NCAT Cardio: Reg rate Chest: normal effort, normal rate of breathing Abd: soft, non-distended Ext: no edema Psych: pleasant, normal affect Skin: intact Neuro: Alert and oriented x4 Neurological:     Comments: Patient is alert.  No acute distress.  Voice is monotone.  He was able to provide his name and age but had some difficulty with new biographical information.  Follows commands. Impaired memory  Results for orders placed or performed during the hospital encounter of 08/21/23 (from the past 48 hours)  CBC     Status: None   Collection Time: 08/23/23  4:58 AM  Result Value Ref Range   WBC 9.5 4.0 - 10.5 K/uL   RBC 5.70 4.22 - 5.81 MIL/uL   Hemoglobin 16.6 13.0 - 17.0 g/dL   HCT 16.1 09.6 - 04.5 %   MCV 81.9 80.0 - 100.0 fL   MCH 29.1 26.0 - 34.0 pg   MCHC 35.5 30.0 - 36.0 g/dL  RDW 12.3 11.5 - 15.5 %   Platelets 320 150 - 400 K/uL   nRBC 0.0 0.0 - 0.2 %    Comment: Performed at [facility], [address] [place], [place] [address]  Basic metabolic panel     Status: Abnormal   Collection Time: 08/23/23  4:58 AM  Result Value Ref Range   Sodium 137 135 - 145 mmol/L   Potassium 3.5 3.5 - 5.1 mmol/L   Chloride 104 98 - 111 mmol/L   CO2 26 22 - 32  mmol/L   Glucose, Bld 103 (H) 70 - 99 mg/dL    Comment: Glucose reference range applies only to samples taken after fasting for at least 8 hours.   BUN 9 6 - 20 mg/dL   Creatinine, Ser 1.91 0.61 - 1.24 mg/dL   Calcium 9.2 8.9 - 47.8 mg/dL   GFR, Estimated >29 >56 mL/min    Comment: (NOTE) Calculated using the CKD-EPI Creatinine Equation (2021)    Anion gap 7 5 - 15    Comment: Performed at [facility], [address], [place], [place] [address]  CBC     Status: None   Collection Time: 08/24/23  6:05 AM  Result Value Ref Range   WBC 8.9 4.0 - 10.5 K/uL   RBC 5.46 4.22 - 5.81 MIL/uL   Hemoglobin 16.0 13.0 - 17.0 g/dL   HCT 65.7 84.6 - 96.2 %   MCV 82.4 80.0 - 100.0 fL   MCH 29.3 26.0 - 34.0 pg   MCHC 35.6 30.0 - 36.0 g/dL   RDW 95.2 84.1 - 32.4 %   Platelets 317 150 - 400 K/uL   nRBC 0.0 0.0 - 0.2 %    Comment: Performed at [facility], [address] [place], [place] [address]  Basic metabolic panel     Status: Abnormal   Collection Time: 08/24/23  6:05 AM  Result Value Ref Range   Sodium 136 135 - 145 mmol/L   Potassium 3.2 (L) 3.5 - 5.1 mmol/L   Chloride 104 98 - 111 mmol/L   CO2 22 22 - 32 mmol/L   Glucose, Bld 103 (H) 70 - 99 mg/dL    Comment: Glucose reference range applies only to samples taken after fasting for at least 8 hours.   BUN 9 6 - 20 mg/dL   Creatinine, Ser 7.25 0.61 - 1.24 mg/dL   Calcium 9.2 8.9 - 36.6 mg/dL   GFR, Estimated >44 >03 mL/min    Comment: (NOTE) Calculated using the CKD-EPI Creatinine Equation (2021)    Anion gap 10 5 - 15    Comment: Performed at [facility], [address], [place], [place] [address]   No results found.     There were no vitals taken for this visit.  Medical Problem List and Plan: 1. Functional deficits secondary to parenchymal hemorrhage of right frontal parietal junction  -patient may shower  -ELOS/Goals: 10-14 days S  Admit to CIR  2.  Antithrombotics: -DVT/anticoagulation:   Pharmaceutical: Lovenox  -antiplatelet therapy: N/A  3. Pain Management: continue Baclofen 10 mg twice daily  4. Mood/Behavior/Sleep: Provide emotional support  -antipsychotic agents: N/A  5. Neuropsych/cognition: This patient is capable of making decisions on his own behalf.  6. Skin/Wound Care: Routine skin checks  7. Fluids/Electrolytes/Nutrition: Routine in and outs with follow-up chemistries  8.  Tobacco use.  [name]- consider discussion of decrease in dose given elevated blood pressure.  Provide counseling  9.  History of polysubstance use.  Urine drug screen positive marijuana.  Patient does endorse using Ativan and Percocet off the streets.  Provide counseling  10.  Constipation.  Continue Senokot S1 tablet twice daily.   I have personally performed a face to face diagnostic evaluation, including, but not limited to relevant history and physical exam findings, of this patient and developed relevant assessment and plan.  Additionally, I have reviewed and concur with the physician assistant's documentation above.  Mcarthur Rossetti Angiulli, PA-C   Erik Chin, MD 08/24/2023

## 2023-08-24 NOTE — Progress Notes (Signed)
 Inpatient Rehabilitation Admission Medication Review by a Pharmacist  A complete drug regimen review was completed for this patient to identify any potential clinically significant medication issues.  High Risk Drug Classes Is patient taking? Indication by Medication  Antipsychotic No   Anticoagulant Yes Enoxaparin - VTE prophylaxis  Antibiotic No   Opioid No   Antiplatelet No   Hypoglycemics/insulin No   Vasoactive Medication No   Chemotherapy No   Other Yes Baclofen - pain Nicotine patch - smoking cessation Senna-docusate - laxative  PRNs: Acetaminophen - mild pain or temp > 99.34F Hydroxyzine - anxiety     Type of Medication Issue Identified Description of Issue Recommendation(s)  Drug Interaction(s) (clinically significant)     Duplicate Therapy     Allergy     No Medication Administration End Date     Incorrect Dose     Additional Drug Therapy Needed     Significant med changes from prior encounter (inform family/care partners about these prior to discharge). Discontinued PRN Ibuprofen and Nyquil Severe Cold/Flu.  New baclofen, nicotine patch, laxative, and prn hydroxyzine. Communicate changes with patient/family prior to discharge.  Other       Clinically significant medication issues were identified that warrant physician communication and completion of prescribed/recommended actions by midnight of the next day:  No  Pharmacist comments:  - Confirmed prophylactic dose Enoxaparin okay per Neuro, Dr. Viviann Spare. Begun 08/23/23 pm.  Time spent performing this drug regimen review (minutes):  [address]   Nicolette Bang [place], [place] 08/24/2023 5:43 PM

## 2023-08-24 NOTE — Progress Notes (Signed)
 STROKE TEAM PROGRESS NOTE    SIGNIFICANT HOSPITAL EVENTS 2/23 presented with left-sided weakness, CT head with ICH and right frontoparietal junction.  Admitted to the ICU  INTERIM HISTORY/SUBJECTIVE  Doing well. Going to CIR today. Sig other at bedside.   OBJECTIVE  CBC    Component Value Date/Time   WBC 8.9 08/24/2023 0605   RBC 5.46 08/24/2023 0605   HGB 16.0 08/24/2023 0605   HCT 45.0 08/24/2023 0605   PLT 317 08/24/2023 0605   MCV 82.4 08/24/2023 0605   MCH 29.3 08/24/2023 0605   MCHC 35.6 08/24/2023 0605   RDW 12.4 08/24/2023 0605   LYMPHSABS 1.2 08/21/2023 1638   MONOABS 0.4 08/21/2023 1638   EOSABS 0.4 08/21/2023 1638   BASOSABS 0.1 08/21/2023 1638    BMET    Component Value Date/Time   NA 136 08/24/2023 0605   K 3.2 (L) 08/24/2023 0605   CL 104 08/24/2023 0605   CO2 22 08/24/2023 0605   GLUCOSE 103 (H) 08/24/2023 0605   BUN 9 08/24/2023 0605   CREATININE 0.87 08/24/2023 0605   CALCIUM 9.2 08/24/2023 0605   GFRNONAA >60 08/24/2023 0605    IMAGING past 24 hours No results found.   Vitals:   08/23/23 2356 08/24/23 0338 08/24/23 0747 08/24/23 1153  BP: 124/76 127/81 (!) 125/95 127/83  Pulse: (!) 52 (!) 48 (!) 51 76  Resp: 17 18    Temp: 98.2 F (36.8 C) 98.6 F (37 C) 98 F (36.7 C) 98 F (36.7 C)  TempSrc: Oral Oral Oral Oral  SpO2: 98% 97% 98% 99%  Weight:      Height:         PHYSICAL EXAM General:  Alert, well-nourished, well-developed patient in no acute distress Psych:  Mood and affect appropriate for situation CV: Regular rate and rhythm on monitor Respiratory:  Regular, unlabored respirations on room air GI: Abdomen soft and nontender   NEURO:  Mental Status: AA&Ox3, patient is able to give clear and coherent history Speech/Language: speech is without dysarthria or aphasia.  Naming, repetition, fluency, and comprehension intact.  Cranial Nerves:  II: PERRL. Visual fields full.  III, IV, VI: EOMI. Eyelids elevate symmetrically.   V: Sensation is intact to light touch and symmetrical to face.  VII: Face is symmetrical resting and smiling VIII: hearing intact to voice. IX, X: Palate elevates symmetrically. Phonation is normal.  JY:NWGNFAOZ shrug 5/5. XII: tongue is midline without fasciculations. Motor: Left arm 3/5, left leg 3/5 can briefly pick it up off the bed, right arm and leg 5/5 Tone: is normal and bulk is normal Sensation- Intact to light touch bilaterally. Extinction absent to light touch to DSS.   Coordination: FTN intact bilaterally, HKS: no ataxia in BLE.No drift.  Gait- deferred  Most Recent NIH  1a Level of Conscious.: 0 1b LOC Questions: 0 1c LOC Commands: 0 2 Best Gaze: 0 3 Visual: 0 4 Facial Palsy: 0 5a Motor Arm - left: 3 5b Motor Arm - Right: 0 6a Motor Leg - Left: 3 6b Motor Leg - Right: 0 7 Limb Ataxia: 3 8 Sensory: 0 9 Best Language: 0 10 Dysarthria: 0 11 Extinct. and Inatten.: 0 TOTAL: 6   ASSESSMENT/PLAN  [name]. is a 33 y.o. male with history of  mallory weiss tear in 2014, anxiety, ADHD, and GERD presenting with left side weakness. NIH on Admission 7  Intracerebral Hemorrhage:  right frontoparietal  Etiology: Unclear workup underway Code Stroke CT head ICH in  right frontoparietal junction. CTA head & neck no LVO CTV no evidence of dural venous sinus thrombosis MRI  Unchanged appearance of intraparenchymal hematoma within the superior right frontal lobe. No mass lesion.  2D Echo EF 55 to 60% Scheduled for cerebral angiogram today LDL 119-monitor for now since stroke is hemorrhagic. HgbA1c 5.3 VTE prophylaxis -SCDs No antithrombotic prior to admission, now on No antithrombotic due to acute ICH Therapy recommendations:  Pending Disposition: Pending   Hypertension Home meds: None Stable Not requiring any BP meds Blood Pressure Goal: SBP between 130-150 for 24 hours and then less than 160   Hyperlipidemia Home meds: None LDL 119, goal <  70 Consider adding statin at discharge  Tobacco Abuse Patient smokes half a pack per day for       Ready to quit? No Nicotine replacement therapy provided  Substance Abuse Patient uses Ativan and Percocet off the streets UDS positive for  [facility]      Ready to quit? No TOC consult for cessation placed  Dysphagia Patient has post-stroke dysphagia, SLP consulted    Diet   Diet Heart Room service appropriate? Yes; Fluid consistency: Thin   Advance diet as tolerated  Other Stroke Risk Factors ETOH use, alcohol level <10, advised to drink no more than 2 drink(s) a day   Other Active Problems   Hospital day # 3  Angio neg. Repeat CT prior to discharge from rehab, f/u with IR for repeat angio outpt.   F/u in stroke clinic 6-8weeks after discharge.    Going to CIR today. Neurology will sign off.   Conner Neiss,MD    To contact Stroke Continuity provider, please refer to [email or web address]. After hours, contact General Neurology

## 2023-08-24 NOTE — Progress Notes (Signed)
 Orthopedic Tech Progress Note Patient Details:  Erik Santana. August 12, 1990 [id number]  Ortho Devices Type of Ortho Device: Prafo boot/shoe Ortho Device/Splint Location: LLE Ortho Device/Splint Interventions: Ordered, Application, Adjustment   Post Interventions Patient Tolerated: Well Instructions Provided: Care of device  [name] 08/24/2023, 12:41 PM

## 2023-08-24 NOTE — Progress Notes (Signed)
 Occupational Therapy Treatment Patient Details Name: Erik Santana. MRN: [id number] DOB: 04/19/1991 Today's Date: 08/24/2023   History of present illness 33 yo male falling and hitting head. CT (+) R frontal lobe ICH NIH 7 PMH ADD anxiety GERD GIB smoker   OT comments  Co- treat with PT - Patient with moderate tone in the lue, fluctuating between flexion and extension.  Will benefit from a palm protector for when in flexion.  Unable to use RUE with mobility today due to procedure requiring +2 A for safety.  Patient with significant dizziness at rest which increased with transitional movements and with sitting EOB. Increases with turning head or eyes to the left. Patient will benefit from intensive inpatient follow-up therapy, >3 hours/day and OT will continue to follow on acute to facilitate discharge.       If plan is discharge home, recommend the following:  Two people to help with walking and/or transfers;Two people to help with bathing/dressing/bathroom;Assistance with cooking/housework;Direct supervision/assist for medications management;Direct supervision/assist for financial management;Assist for transportation;Help with stairs or ramp for entrance   Equipment Recommendations       Recommendations for Other Services Rehab consult    Precautions / Restrictions Precautions Precautions: Fall (RUE - No use of UE for 24 hours from arteriogram - ends 3:30 pm 2/26.  No lifting > 5 lbs and limit pushing and pulling x 5 days) Restrictions Weight Bearing Restrictions Per Provider Order: No       Mobility Bed Mobility Overal bed mobility: Needs Assistance Bed Mobility: Rolling, Sidelying to Sit, Supine to Sit Rolling: Max assist   Supine to sit: Max assist, +2 for physical assistance Sit to supine: Max assist, +2 for physical assistance   General bed mobility comments: Pt unable to use RUE to assist with mobility due to procedure and therefore needing more assist    Transfers                    General transfer comment: did not transfer or stand due to 10/10 dizziness.     Balance Overall balance assessment: Needs assistance Sitting-balance support: Feet supported Sitting balance-Leahy Scale: Poor                                     ADL either performed or assessed with clinical judgement   ADL   Eating/Feeding: Set up                                     General ADL Comments: Working with PT to get out of bed and tolerate sitting    Extremity/Trunk Assessment Upper Extremity Assessment Upper Extremity Assessment: LUE deficits/detail LUE Deficits / Details: moderate tone present in flexion and extension. Hand initailly locked in extension and then tight in flexion. Able to range in all planes and through full range. Recommend palm protector for positioning  LUE Sensation: decreased light touch LUE Coordination:  (complains of numbness, but will then say its burning.  Needs further asessment)   Lower Extremity Assessment Lower Extremity Assessment: Defer to PT evaluation        Vision   Vision Assessment?: Vision impaired- to be further tested in functional context Additional Comments: Patient with significant dizziness with transitional movements and in sitting.  Reported 8/10 and moved to 10/10.  Needed to return supine after ~  10 min of sitting.   Perception     Praxis     Communication Communication Communication: No apparent difficulties   Cognition Arousal: Alert Behavior During Therapy: WFL for tasks assessed/performed Cognition: No apparent impairments                               Following commands: Intact        Cueing      Exercises      Shoulder Instructions       General Comments Unable to take BP due to procedure in RUE and LUE hemiparetic - unable to reach LE with BP cuff    Pertinent Vitals/ Pain       Pain Assessment Pain Assessment: Faces Faces Pain Scale:  Hurts a little bit Pain Location: left side hip area Pain Descriptors / Indicators: Discomfort Pain Intervention(s): Limited activity within patient's tolerance, Repositioned, Relaxation  Home Living                           Bathroom Accessibility: Yes How Accessible: Accessible via walker        Lives With: Spouse    Prior Functioning/Environment              Frequency  Min 1X/week        Progress Toward Goals  OT Goals(current goals can now be found in the care plan section)  Progress towards OT goals: Progressing toward goals  Acute Rehab OT Goals Patient Stated Goal: To get better OT Goal Formulation: With patient/family Time For Goal Achievement: 09/05/23 Potential to Achieve Goals: Good ADL Goals Pt Will Perform Grooming: with min assist;sitting Pt Will Perform Upper Body Bathing: with min assist;sitting Pt Will Transfer to Toilet: with +2 assist;with min assist;stand pivot transfer Pt/caregiver will Perform Home Exercise Program: Left upper extremity;With theraband;With minimal assist Additional ADL Goal #1: Pt will static sit EOB with CG(A)  Plan      Co-evaluation    PT/OT/SLP Co-Evaluation/Treatment: Yes Reason for Co-Treatment: Complexity of the patient's impairments (multi-system involvement)   OT goals addressed during session: ADL's and self-care;Other (comment) (functional mobility)      AM-PAC OT "6 Clicks" Daily Activity     Outcome Measure   Help from another person eating meals?: A Little Help from another person taking care of personal grooming?: A Little Help from another person toileting, which includes using toliet, bedpan, or urinal?: A Lot Help from another person bathing (including washing, rinsing, drying)?: A Lot Help from another person to put on and taking off regular upper body clothing?: A Lot Help from another person to put on and taking off regular lower body clothing?: A Lot 6 Click Score: 14    End of  Session    OT Visit Diagnosis: Unsteadiness on feet (R26.81);Muscle weakness (generalized) (M62.81);Hemiplegia and hemiparesis Hemiplegia - Right/Left: Left Hemiplegia - dominant/non-dominant: Non-Dominant Hemiplegia - caused by: Unspecified   Activity Tolerance Other (comment) (Very willing to participate, but session limited by dizziness)   Patient Left in bed;with family/visitor present;with call bell/phone within reach   Nurse Communication Mobility status;Precautions        Time: 0981-1914 OT Time Calculation (min): 31 min  Charges: OT General Charges $OT Visit: 1 Visit OT Treatments $Neuromuscular Re-education: 8-22 mins Hal Neer OTR/L   Malachi Bonds 08/24/2023, 10:01 AM

## 2023-08-24 NOTE — PMR Pre-admission (Signed)
 PMR Admission Coordinator Pre-Admission Assessment  Patient: Erik Shenk. is an 33 y.o., male MRN: [id number] DOB: 08/13/1990 Height: 6\' 1"  (185.4 cm) Weight: 59.9 kg  Insurance Information HMO:     PPO:      PCP:      IPA:      80/20:      OTHER:  PRIMARY: Uninsured  SECONDARY:       Policy#:      Phone#:   Artist:       Phone#:   The Data processing manager" for patients in Inpatient Rehabilitation Facilities with attached "Privacy Act Statement-Health Care Records" was provided and verbally reviewed with: N/A  Emergency Contact Information Contact Information     Name Relation Home Work Mobile   Hendrix,Sanjit Father [phone]  [phone]      Other Contacts     Name Relation Home Work Mobile   Haverstock,[name] Mother   [phone]       Current Medical History  Patient Admitting Diagnosis: IPH History of Present Illness: Erik Santana, Erik Santana. 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  Patient does endorse using Ativan and Percocet off the streets.  Per chart review patient independent prior to admission working in [employer].  1 level home with a ramped entrance.  He does help provide care for his grandfather.  Mother and family plans to assist as needed on discharge.  Presented 08/21/2023 with acute onset of left-sided weakness as well as dizziness and headache.  He was able to call EMS himself.  EMS found him on the floor.  Blood pressure 130/81.  CT of the head showed a 3.9 x 2.4 x 4.1 cm parenchymal hemorrhage centered at the right frontal parietal junction.  No evidence of intraventricular extension no mass effect.  CT cervical spine negative.  CTA showed no intracranial large vessel occlusion or significant stenosis.  Echocardiogram with ejection fraction of 55 to 60% no wall motion abnormalities.  Admission chemistries unremarkable except  glucose 157, urine drug screen positive marijuana.  MRI follow-up showed unchanged appearance of intraparenchymal hematoma within the superior right frontal lobe.  No mass lesion.  Arteriogram completed per interventional radiology showing no gross abnormalities.  Neurology continues to follow with conservative care.  Lovenox was initiated for DVT prophylaxis 08/23/2023.  Close monitoring of blood pressure.  He has not required Cleviprex.  [name] initiated for tobacco use.  Therapy evaluations completed due to patient's decreased functional mobility was admitted for a comprehensive rehab program  Complete NIHSS TOTAL: 7  Patient's medical record from [facility]  has been reviewed by the rehabilitation admission coordinator and physician.  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 Yes  Family History   family history includes Healthy in his father and mother.  Current Medications  Current Facility-Administered Medications:    acetaminophen (TYLENOL) tablet 650 mg, 650 mg, Oral, Q4H PRN, 650 mg at 08/24/23 0958 **OR** acetaminophen (TYLENOL) 160 MG/5ML solution 650 mg, 650 mg, Per Tube, Q4H PRN **OR** acetaminophen (TYLENOL) suppository 650 mg, 650 mg, Rectal, Q4H PRN, Milon Dikes, MD   baclofen (LIORESAL) tablet 10 mg, 10 mg, Oral, BID,  Harolyn Rutherford, MD, 10 mg at 08/24/23 4098   Chlorhexidine Gluconate Cloth 2 % PADS 6 each, 6 each, Topical, Daily, Rejeana Brock, MD, 6 each at 08/22/23 1013   labetalol (NORMODYNE) injection 20 mg, 20 mg, Intravenous, Once **AND** clevidipine (CLEVIPREX) infusion 0.5 mg/mL, 0-21 mg/hr, Intravenous, Continuous, Milon Dikes, MD   enoxaparin (LOVENOX) injection 40 mg, 40 mg, Subcutaneous, QHS, Palikh,  Gaurang M, MD, 40 mg at 08/23/23 2207   hydrOXYzine (ATARAX) tablet 50 mg, 50 mg, Oral, Q6H PRN, Erick Blinks, MD, 50 mg at 08/24/23 1191   nicotine (NICODERM CQ - dosed in mg/24 hours) patch 14 mg, 14 mg, Transdermal, Daily, Rejeana Brock, MD, 14 mg at 08/24/23 4782   senna-docusate (Senokot-S) tablet 1 tablet, 1 tablet, Oral, BID, Milon Dikes, MD, 1 tablet at 08/21/23 2259  Patients Current Diet:  Diet Order             Diet - low sodium heart healthy           Diet Carb Modified           Diet Heart Room service appropriate? Yes; Fluid consistency: Thin  Diet effective now                   Precautions / Restrictions Precautions Precautions: Fall (RUE - No use of UE for 24 hours from arteriogram - ends 3:30 pm 2/26.  No lifting > 5 lbs and limit pushing and pulling x 5 days) Precaution/Restrictions Comments: SBP 130-150 Restrictions Weight Bearing Restrictions Per Provider Order: No   Has the patient had 2 or more falls or a fall with injury in the past year? Yes  Prior Activity Level Community (5-7x/wk): Pt. active in the community PTA  Prior Functional Level Self Care: Did the patient need help bathing, dressing, using the toilet or eating? Independent  Indoor Mobility: Did the patient need assistance with walking from room to room (with or without device)? Independent  Stairs: Did the patient need assistance with internal or external stairs (with or without device)? Independent  Functional Cognition: Did the patient need help planning regular tasks such as shopping or remembering to take medications? Independent  Patient Information Are you of Hispanic, Latino/a,or Spanish origin?: A. No, not of Hispanic, Latino/a, or Spanish origin What is your race?: A. White Do you need or want an interpreter to communicate with a doctor or health care staff?: 0. No  Patient's Response To:  Health Literacy and Transportation Is the patient able to respond to  health literacy and transportation needs?: Yes Health Literacy - How often do you need to have someone help you when you read instructions, pamphlets, or other written material from your doctor or pharmacy?: Never In the past 12 months, has lack of transportation kept you from medical appointments or from getting medications?: No In the past 12 months, has lack of transportation kept you from meetings, work, or from getting things needed for daily living?: No  Home Assistive Devices / Equipment Home Equipment: None  Prior Device Use: Indicate devices/aids used by the patient prior to current illness, exacerbation or injury? None of the above  Current Functional Level Cognition  Arousal/Alertness: Awake/alert Overall Cognitive Status: Impaired/Different from baseline Orientation Level: Oriented X4 Memory: Impaired Memory Impairment: Decreased recall of new information    Extremity Assessment (includes Sensation/Coordination)  Upper Extremity Assessment: LUE deficits/detail LUE Deficits / Details: moderate tone present in flexion and extension. Hand initailly locked in extension and then  tight in flexion. Able to range in all planes and through full range. LUE Sensation: decreased light touch LUE Coordination:  (complains of numbnes, but will then say its burning.  Needs further asessment)  Lower Extremity Assessment: Defer to PT evaluation LLE Deficits / Details: clonus with extensor synergy, max difficulty to break into flexion. 1/5 hip abd/add but no other volitional movement appreciated, states "it feels like novocaine"    ADLs  Overall ADL's : Needs assistance/impaired Eating/Feeding: Set up Grooming: Minimal assistance Upper Body Bathing: Moderate assistance Lower Body Bathing: Maximal assistance Upper Body Dressing : Moderate assistance Lower Body Dressing: Maximal assistance General ADL Comments: Working with PT to get out of bed and tolerate sitting    Mobility  Overal  bed mobility: Needs Assistance Bed Mobility: Supine to Sit, Sit to Supine, Rolling Rolling: Max assist Supine to sit: Max assist, +2 for physical assistance Sit to supine: +2 for physical assistance, Mod assist General bed mobility comments: Pt unable to use RUE to assist with mobility due to rt radial arteriogram 2/25 at 15:32 (still within 24 hrs post-procedure) and therefore needing more assist    Transfers  Overall transfer level: Needs assistance Equipment used: 2 person hand held assist Transfers: Sit to/from Stand Sit to Stand: +2 physical assistance, Mod assist General transfer comment: did not transfer or stand due to 10/10 dizziness with increasing nausea    Ambulation / Gait / Stairs / Wheelchair Mobility       Posture / Balance Dynamic Sitting Balance Sitting balance - Comments: mod truncal assist to correct L lateral bias Balance Overall balance assessment: Needs assistance Sitting-balance support: Feet supported Sitting balance-Leahy Scale: Poor Sitting balance - Comments: mod truncal assist to correct L lateral bias Postural control: Left lateral lean Standing balance support: Bilateral upper extremity supported, During functional activity Standing balance-Leahy Scale: Zero Standing balance comment: mod-max +2    Special needs/care consideration Special service needs TBI therapies   Previous Home Environment (from acute therapy documentation) Living Arrangements: Other relatives  Lives With: Spouse Available Help at Discharge: Family Type of Home: House Home Layout: One level Home Access: Ramped entrance Bathroom Shower/Tub: Walk-in shower, Door [employer] Toilet: Standard Bathroom Accessibility: Yes How Accessible: Accessible via walker Home Care Services: No Additional Comments: Grandfather, helps care for grandfather, working in [employer]  Discharge Living Setting Plans for Discharge Living Setting: House Type of Home at Discharge: House Discharge  Home Layout: One level Discharge Home Access: Stairs to enter Entrance Stairs-Rails: None Discharge Bathroom Shower/Tub: Walk-in shower Discharge Bathroom Toilet: Standard Discharge Bathroom Accessibility: Yes How Accessible: Accessible via walker, Accessible via wheelchair  Social/Family/Support Systems Patient Roles: Parent Contact Information: [phone] Anticipated Caregiver: Claudius Sis Caregiver Availability: 24/7 Discharge Plan Discussed with Primary Caregiver: Yes Is Caregiver In Agreement with Plan?: Yes Does Caregiver/Family have Issues with Lodging/Transportation while Pt is in Rehab?: No  Goals Patient/Family Goal for Rehab: PT/OT/SLP Supervision Expected length of stay: 12-14 days Pt/Family Agrees to Admission and willing to participate: Yes Program Orientation Provided & Reviewed with Pt/Caregiver Including Roles  & Responsibilities: Yes  Decrease burden of Care through IP rehab admission: not anticipated  Possible need for SNF placement upon discharge: not anticipated  Patient Condition: I have reviewed medical records from  [facility], spoken with CM, and patient and family member. I met with patient at the bedside for inpatient rehabilitation assessment.  Patient will benefit from ongoing PT, OT, and SLP, can actively participate in 3 hours of therapy a day  5 days of the week, and can make measurable gains during the admission.  Patient will also benefit from the coordinated team approach during an Inpatient Acute Rehabilitation admission.  The patient will receive intensive therapy as well as Rehabilitation physician, nursing, social worker, and care management interventions.  Due to safety, skin/wound care, disease management, medication administration, pain management, and patient education the patient requires 24 hour a day rehabilitation nursing.  The patient is currently mod A+2 with mobility and basic ADLs.  Discharge setting and therapy post  discharge at home with home health is anticipated.  Patient has agreed to participate in the Acute Inpatient Rehabilitation Program and will admit today.  Preadmission Screen Completed By:  Jeronimo Greaves, 08/24/2023 12:21 PM ______________________________________________________________________   Discussed status with Dr. Carlis Abbott on 08/24/23 at 900 and received approval for admission today.  Admission Coordinator:  Jeronimo Greaves, CCC-SLP, time 1228/Date 08/24/23   Assessment/Plan: Diagnosis: Nontraumatic intracranial hemorrhage Does the need for close, 24 hr/day Medical supervision in concert with the patient's rehab needs make it unreasonable for this patient to be served in a less intensive setting? Yes Co-Morbidities requiring supervision/potential complications: underweight, anxiety, ADD, Mallory- Weiss tear 2014, GERD, tobacco use Due to bladder management, bowel management, safety, skin/wound care, disease management, medication administration, pain management, and patient education, does the patient require 24 hr/day rehab nursing? Yes Does the patient require coordinated care of a physician, rehab nurse, PT, OT to address physical and functional deficits in the context of the above medical diagnosis(es)? Yes Addressing deficits in the following areas: balance, endurance, locomotion, strength, transferring, bowel/bladder control, bathing, dressing, feeding, grooming, toileting, and psychosocial support Can the patient actively participate in an intensive therapy program of at least 3 hrs of therapy 5 days a week? Yes The potential for patient to make measurable gains while on inpatient rehab is excellent Anticipated functional outcomes upon discharge from inpatient rehab: modified independent PT, modified independent OT, independent SLP Estimated rehab length of stay to reach the above functional goals is: 10-14 days Anticipated discharge destination: Home 10. Overall Rehab/Functional  Prognosis: excellent   MD Signature: Sula Soda, MD

## 2023-08-24 NOTE — Plan of Care (Signed)
  Problem: Intracerebral Hemorrhage Tissue Perfusion: Goal: Complications of Intracerebral Hemorrhage will be minimized Outcome: Not Progressing   Problem: Coping: Goal: Will verbalize positive feelings about self Outcome: Not Progressing Goal: Will identify appropriate support needs Outcome: Not Progressing   Problem: Self-Care: Goal: Ability to participate in self-care as condition permits will improve Outcome: Not Progressing Goal: Verbalization of feelings and concerns over difficulty with self-care will improve Outcome: Not Progressing Goal: Ability to communicate needs accurately will improve Outcome: Not Progressing   Problem: Clinical Measurements: Goal: Cardiovascular complication will be avoided Outcome: Not Progressing   Problem: Activity: Goal: Risk for activity intolerance will decrease Outcome: Not Progressing

## 2023-08-24 NOTE — Progress Notes (Signed)
 Physical Therapy Treatment Patient Details Name: Erik Santana. MRN: [id number] DOB: [birth year] Today's Date: 08/24/2023   History of Present Illness 33 yo male falling and hitting head. CT (+) R frontal lobe ICH NIH 7 PMH ADD anxiety GERD GIB smoker    PT Comments  Patient eager to work with therapies (even putting his breakfast aside). Educated pt on limited/no use of RUE post radial approach for arteriogram. Patient frequently trying to use RUE throughout session and required cues to avoid use. His inability to use RUE meant he required increased assist to move to EOB. Patient reporting spinning dizziness and head "feeling heavy" and pulled downward and to the left while sitting. Symptoms 10/10 per pt. Little relief when educated on visual fixation (which pt would maintain for up to 10 seconds and then look away). Required up to moderate assist to sit EOB with left lean/bias. Provided additional Lt heel cord stretch in sitting, but limited at times by clonus. Standing deferred due to severity of dizziness/nausea and inability to get an accurate BP. Will request PRAFO for LLE to prevent further tightness/contracture of left heel cord.    If plan is discharge home, recommend the following: Two people to help with walking and/or transfers;Two people to help with bathing/dressing/bathroom   Can travel by private vehicle        Equipment Recommendations  None recommended by PT    Recommendations for Other Services       Precautions / Restrictions Precautions Precautions: Fall (RUE - No use of UE for 24 hours from arteriogram - ends 3:30 pm 2/26.  No lifting > 5 lbs and limit pushing and pulling x 5 days) Precaution/Restrictions Comments: SBP 130-150 Restrictions Weight Bearing Restrictions Per Provider Order: No     Mobility  Bed Mobility Overal bed mobility: Needs Assistance Bed Mobility: Supine to Sit, Sit to Supine, Rolling Rolling: Max assist   Supine to sit: Max assist, +2  for physical assistance Sit to supine: +2 for physical assistance, Mod assist   General bed mobility comments: Pt unable to use RUE to assist with mobility due to rt radial arteriogram 2/25 at 15:32 (still within 24 hrs post-procedure) and therefore needing more assist    Transfers Overall transfer level: Needs assistance Equipment used: 2 person hand held assist Transfers: Sit to/from Stand Sit to Stand: +2 physical assistance, Mod assist           General transfer comment: did not transfer or stand due to 10/10 dizziness with increasing nausea    Ambulation/Gait                   Stairs             Wheelchair Mobility     Tilt Bed    Modified Rankin (Stroke Patients Only) Modified Rankin (Stroke Patients Only) Pre-Morbid Rankin Score: No symptoms Modified Rankin: Severe disability     Balance Overall balance assessment: Needs assistance Sitting-balance support: Feet supported Sitting balance-Leahy Scale: Poor Sitting balance - Comments: mod truncal assist to correct L lateral bias Postural control: Left lateral lean Standing balance support: Bilateral upper extremity supported, During functional activity Standing balance-Leahy Scale: Zero Standing balance comment: mod-max +2                            Communication Communication Communication: No apparent difficulties  Cognition Arousal: Alert Behavior During Therapy: WFL for tasks assessed/performed   PT - Cognitive impairments:  No apparent impairments                         Following commands: Intact      Cueing Cueing Techniques: Verbal cues  Exercises Other Exercises Other Exercises: PROM/stretching to Lt heel cord (only able to achieve neutral); LLE heelslides x 5 (educated sister to complete q 2 hours when awake)    General Comments General comments (skin integrity, edema, etc.): Unable to take BP in RUE post-procedure; LUE with incr tone; legs in dependent  position and would be in accurate      Pertinent Vitals/Pain Pain Assessment Pain Assessment: Faces Faces Pain Scale: Hurts a little bit Pain Location: left side hip area Pain Descriptors / Indicators: Discomfort Pain Intervention(s): Limited activity within patient's tolerance, Monitored during session    Home Living                          Prior Function            PT Goals (current goals can now be found in the care plan section) Acute Rehab PT Goals PT Goal Formulation: With patient Time For Goal Achievement: 09/05/23 Potential to Achieve Goals: Good Progress towards PT goals: Not progressing toward goals - comment (limited ability to use RUE post-arteriogram; limited by dizziness/nausea)    Frequency    Min 1X/week      PT Plan      Co-evaluation PT/OT/SLP Co-Evaluation/Treatment: Yes Reason for Co-Treatment: Complexity of the patient's impairments (multi-system involvement) PT goals addressed during session: Balance;Mobility/safety with mobility OT goals addressed during session: ADL's and self-care;Other (comment) (functional mobility)      AM-PAC PT "6 Clicks" Mobility   Outcome Measure  Help needed turning from your back to your side while in a flat bed without using bedrails?: A Lot Help needed moving from lying on your back to sitting on the side of a flat bed without using bedrails?: A Lot Help needed moving to and from a bed to a chair (including a wheelchair)?: Total Help needed standing up from a chair using your arms (e.g., wheelchair or bedside chair)?: Total Help needed to walk in hospital room?: Total Help needed climbing 3-5 steps with a railing? : Total 6 Click Score: 8    End of Session   Activity Tolerance: Treatment limited secondary to medical complications (Comment) (dizziness, nausea) Patient left: in bed;with call bell/phone within reach;with bed alarm set;Other (comment) (with hip and knee positioned in flexion to avoid  heavy extensor tone) Nurse Communication: Mobility status PT Visit Diagnosis: Other abnormalities of gait and mobility (R26.89);Muscle weakness (generalized) (M62.81);Hemiplegia and hemiparesis Hemiplegia - Right/Left: Left Hemiplegia - dominant/non-dominant: Non-dominant Hemiplegia - caused by: Nontraumatic intracerebral hemorrhage     Time: 0859-0927 PT Time Calculation (min) (ACUTE ONLY): 28 min  Charges:    $Therapeutic Activity: 8-22 mins PT General Charges $$ ACUTE PT VISIT: 1 Visit                      Jerolyn Center, PT Acute Rehabilitation Services  Office [phone]    Erik Santana 08/24/2023, 10:51 AM

## 2023-08-24 NOTE — TOC Transition Note (Signed)
 Transition of Care [facility]) - Discharge Note   Patient Details  Name: Erik Santana. MRN: [id number] Date of Birth: Aug 12, 1990  [facility] of [facility] CM/SW Contact:  Kermit Balo, RN Phone Number: 08/24/2023, 11:33 AM   Clinical Narrative:     Pt is discharging to CIR today. No further needs per TOC.  Final next level of care: IP Rehab Facility Barriers to Discharge: Inadequate or no insurance, Barriers Unresolved (comment)   Patient Goals and CMS Choice     Choice offered to / list presented to : Patient      Discharge Placement                       Discharge Plan and Services Additional resources added to the After Visit Summary for                                       Social Drivers of Health (SDOH) Interventions SDOH Screenings   Tobacco Use: High Risk (08/22/2023)     Readmission Risk Interventions     No data to display

## 2023-08-24 NOTE — Progress Notes (Signed)
 PMR Admission Coordinator Pre-Admission Assessment   Patient: Erik Santana. is an 33 y.o., male MRN: [id number] DOB: 26-Feb-1991 Height: 6\' 1"  (185.4 cm) Weight: 59.9 kg   Insurance Information HMO:     PPO:      PCP:      IPA:      80/20:      OTHER:  PRIMARY: Uninsured   SECONDARY:       Policy#:      Phone#:    Artist:       Phone#:    The Data processing manager" for patients in Inpatient Rehabilitation Facilities with attached "Privacy Act Statement-Health Care Records" was provided and verbally reviewed with: N/A   Emergency Contact Information Contact Information       Name Relation Home Work Mobile    Hisle,Inri Father [phone]   [phone]         Other Contacts       Name Relation Home Work Mobile    Distler,[name] Mother     [phone]           Current Medical History  Patient Admitting Diagnosis: IPH History of Present Illness: Erik Santana, Erik Santana. 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  Patient does endorse using Ativan and Percocet off the streets.  Per chart review patient independent prior to admission working in [employer].  1 level home with a ramped entrance.  He does help provide care for his grandfather.  Mother and family plans to assist as needed on discharge.  Presented 08/21/2023 with acute onset of left-sided weakness as well as dizziness and headache.  He was able to call EMS himself.  EMS found him on the floor.  Blood pressure 130/81.  CT of the head showed a 3.9 x 2.4 x 4.1 cm parenchymal hemorrhage centered at the right frontal parietal junction.  No evidence of intraventricular extension no mass effect.  CT cervical spine negative.  CTA showed no intracranial large vessel occlusion or significant stenosis.  Echocardiogram with ejection fraction of 55 to 60% no wall motion abnormalities.  Admission chemistries  unremarkable except glucose 157, urine drug screen positive marijuana.  MRI follow-up showed unchanged appearance of intraparenchymal hematoma within the superior right frontal lobe.  No mass lesion.  Arteriogram completed per interventional radiology showing no gross abnormalities.  Neurology continues to follow with conservative care.  Lovenox was initiated for DVT prophylaxis 08/23/2023.  Close monitoring of blood pressure.  He has not required Cleviprex.  [name] initiated for tobacco use.  Therapy evaluations completed due to patient's decreased functional mobility was admitted for a comprehensive rehab program  Complete NIHSS TOTAL: 7   Patient's medical record from [facility]  has been reviewed by the rehabilitation admission coordinator and physician.   Past Medical History      Past Medical History:  Diagnosis Date   ADD (attention deficit disorder)     Anxiety     Asthma     GERD (gastroesophageal reflux disease)     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          Has the patient had major surgery during 100 days prior to admission? Yes   Family History   family history includes Healthy in his father and mother.   Current Medications  Current Medications    Current Facility-Administered Medications:    acetaminophen (TYLENOL) tablet 650 mg, 650 mg, Oral, Q4H  PRN, 650 mg at 08/24/23 0958 **OR** acetaminophen (TYLENOL) 160 MG/5ML solution 650 mg, 650 mg, Per Tube, Q4H PRN **OR** acetaminophen (TYLENOL) suppository 650 mg, 650 mg, Rectal, Q4H PRN, Milon Dikes, MD   baclofen (LIORESAL) tablet 10 mg, 10 mg, Oral, BID, Viviann Spare, Jeri Lager, MD, 10 mg at 08/24/23 0981   Chlorhexidine Gluconate Cloth 2 % PADS 6 each, 6 each, Topical, Daily, Rejeana Brock, MD, 6 each at 08/22/23 1013   labetalol (NORMODYNE) injection 20 mg, 20 mg, Intravenous, Once **AND** clevidipine (CLEVIPREX) infusion 0.5 mg/mL, 0-21 mg/hr, Intravenous, Continuous, Milon Dikes, MD    enoxaparin (LOVENOX) injection 40 mg, 40 mg, Subcutaneous, QHS, Palikh, Gaurang M, MD, 40 mg at 08/23/23 2207   hydrOXYzine (ATARAX) tablet 50 mg, 50 mg, Oral, Q6H PRN, Erick Blinks, MD, 50 mg at 08/24/23 1914   nicotine (NICODERM CQ - dosed in mg/24 hours) patch 14 mg, 14 mg, Transdermal, Daily, Rejeana Brock, MD, 14 mg at 08/24/23 7829   senna-docusate (Senokot-S) tablet 1 tablet, 1 tablet, Oral, BID, Milon Dikes, MD, 1 tablet at 08/21/23 2259     Patients Current Diet:  Diet Order                  Diet - low sodium heart healthy             Diet Carb Modified             Diet Heart Room service appropriate? Yes; Fluid consistency: Thin  Diet effective now                         Precautions / Restrictions Precautions Precautions: Fall (RUE - No use of UE for 24 hours from arteriogram - ends 3:30 pm 2/26.  No lifting > 5 lbs and limit pushing and pulling x 5 days) Precaution/Restrictions Comments: SBP 130-150 Restrictions Weight Bearing Restrictions Per Provider Order: No    Has the patient had 2 or more falls or a fall with injury in the past year? Yes   Prior Activity Level Community (5-7x/wk): Pt. active in the community PTA   Prior Functional Level Self Care: Did the patient need help bathing, dressing, using the toilet or eating? Independent   Indoor Mobility: Did the patient need assistance with walking from room to room (with or without device)? Independent   Stairs: Did the patient need assistance with internal or external stairs (with or without device)? Independent   Functional Cognition: Did the patient need help planning regular tasks such as shopping or remembering to take medications? Independent   Patient Information Are you of Hispanic, Latino/a,or Spanish origin?: A. No, not of Hispanic, Latino/a, or Spanish origin What is your race?: A. White Do you need or want an interpreter to communicate with a doctor or health care staff?: 0.  No   Patient's Response To:  Health Literacy and Transportation Is the patient able to respond to health literacy and transportation needs?: Yes Health Literacy - How often do you need to have someone help you when you read instructions, pamphlets, or other written material from your doctor or pharmacy?: Never In the past 12 months, has lack of transportation kept you from medical appointments or from getting medications?: No In the past 12 months, has lack of transportation kept you from meetings, work, or from getting things needed for daily living?: No   Journalist, newspaper / Equipment Home Equipment: None   Prior Device Use: Indicate devices/aids  used by the patient prior to current illness, exacerbation or injury? None of the above   Current Functional Level Cognition   Arousal/Alertness: Awake/alert Overall Cognitive Status: Impaired/Different from baseline Orientation Level: Oriented X4 Memory: Impaired Memory Impairment: Decreased recall of new information    Extremity Assessment (includes Sensation/Coordination)   Upper Extremity Assessment: LUE deficits/detail LUE Deficits / Details: moderate tone present in flexion and extension. Hand initailly locked in extension and then tight in flexion. Able to range in all planes and through full range. LUE Sensation: decreased light touch LUE Coordination:  (complains of numbnes, but will then say its burning.  Needs further asessment)  Lower Extremity Assessment: Defer to PT evaluation LLE Deficits / Details: clonus with extensor synergy, max difficulty to break into flexion. 1/5 hip abd/add but no other volitional movement appreciated, states "it feels like novocaine"     ADLs   Overall ADL's : Needs assistance/impaired Eating/Feeding: Set up Grooming: Minimal assistance Upper Body Bathing: Moderate assistance Lower Body Bathing: Maximal assistance Upper Body Dressing : Moderate assistance Lower Body Dressing: Maximal  assistance General ADL Comments: Working with PT to get out of bed and tolerate sitting     Mobility   Overal bed mobility: Needs Assistance Bed Mobility: Supine to Sit, Sit to Supine, Rolling Rolling: Max assist Supine to sit: Max assist, +2 for physical assistance Sit to supine: +2 for physical assistance, Mod assist General bed mobility comments: Pt unable to use RUE to assist with mobility due to rt radial arteriogram 2/25 at 15:32 (still within 24 hrs post-procedure) and therefore needing more assist     Transfers   Overall transfer level: Needs assistance Equipment used: 2 person hand held assist Transfers: Sit to/from Stand Sit to Stand: +2 physical assistance, Mod assist General transfer comment: did not transfer or stand due to 10/10 dizziness with increasing nausea     Ambulation / Gait / Stairs / Wheelchair Mobility         Posture / Balance Dynamic Sitting Balance Sitting balance - Comments: mod truncal assist to correct L lateral bias Balance Overall balance assessment: Needs assistance Sitting-balance support: Feet supported Sitting balance-Leahy Scale: Poor Sitting balance - Comments: mod truncal assist to correct L lateral bias Postural control: Left lateral lean Standing balance support: Bilateral upper extremity supported, During functional activity Standing balance-Leahy Scale: Zero Standing balance comment: mod-max +2     Special needs/care consideration Special service needs TBI therapies    Previous Home Environment (from acute therapy documentation) Living Arrangements: Other relatives  Lives With: Spouse Available Help at Discharge: Family Type of Home: House Home Layout: One level Home Access: Ramped entrance Bathroom Shower/Tub: Walk-in shower, Door [employer] Toilet: Standard Bathroom Accessibility: Yes How Accessible: Accessible via walker Home Care Services: No Additional Comments: Grandfather, helps care for grandfather, 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   Discharge Living Setting Plans for Discharge Living Setting: House Type of Home at Discharge: House Discharge Home Layout: One level Discharge Home Access: Stairs to enter Entrance Stairs-Rails: None Discharge Bathroom Shower/Tub: Walk-in shower Discharge Bathroom Toilet: Standard Discharge Bathroom Accessibility: Yes How Accessible: Accessible via walker, Accessible via wheelchair   Social/Family/Support Systems Patient Roles: Parent Contact Information: [phone] Anticipated Caregiver: Karder Goodin Caregiver Availability: 24/7 Discharge Plan Discussed with Primary Caregiver: Yes Is Caregiver In Agreement with Plan?: Yes Does Caregiver/Family have Issues with Lodging/Transportation while Pt is in Rehab?: No   Goals Patient/Family Goal for Rehab: PT/OT/SLP Supervision Expected length of stay: 12-14 days Pt/Family Agrees to Admission and  willing to participate: Yes Program Orientation Provided & Reviewed with Pt/Caregiver Including Roles  & Responsibilities: Yes   Decrease burden of Care through IP rehab admission: not anticipated   Possible need for SNF placement upon discharge: not anticipated   Patient Condition: I have reviewed medical records from [facility], spoken with CM, and patient and family member. I met with patient at the bedside for inpatient rehabilitation assessment.  Patient will benefit from ongoing PT, OT, and SLP, can actively participate in 3 hours of therapy a day 5 days of the week, and can make measurable gains during the admission.  Patient will also benefit from the coordinated team approach during an Inpatient Acute Rehabilitation admission.  The patient will receive intensive therapy as well as Rehabilitation physician, nursing, social worker, and care management interventions.  Due to safety, skin/wound care, disease management, medication administration, pain management, and patient education the patient requires 24  hour a day rehabilitation nursing.  The patient is currently mod A+2 with mobility and basic ADLs.  Discharge setting and therapy post discharge at home with home health is anticipated.  Patient has agreed to participate in the Acute Inpatient Rehabilitation Program and will admit today.   Preadmission Screen Completed By:  Jeronimo Greaves, 08/24/2023 12:21 PM ______________________________________________________________________   Discussed status with Dr. Carlis Abbott on 08/24/23 at 900 and received approval for admission today.   Admission Coordinator:  Jeronimo Greaves, CCC-SLP, time 1228/Date 08/24/23    Assessment/Plan: Diagnosis: Nontraumatic intracranial hemorrhage Does the need for close, 24 hr/day Medical supervision in concert with the patient's rehab needs make it unreasonable for this patient to be served in a less intensive setting? Yes Co-Morbidities requiring supervision/potential complications: underweight, anxiety, ADD, Mallory- Weiss tear 2014, GERD, tobacco use Due to bladder management, bowel management, safety, skin/wound care, disease management, medication administration, pain management, and patient education, does the patient require 24 hr/day rehab nursing? Yes Does the patient require coordinated care of a physician, rehab nurse, PT, OT to address physical and functional deficits in the context of the above medical diagnosis(es)? Yes Addressing deficits in the following areas: balance, endurance, locomotion, strength, transferring, bowel/bladder control, bathing, dressing, feeding, grooming, toileting, and psychosocial support Can the patient actively participate in an intensive therapy program of at least 3 hrs of therapy 5 days a week? Yes The potential for patient to make measurable gains while on inpatient rehab is excellent Anticipated functional outcomes upon discharge from inpatient rehab: modified independent PT, modified independent OT, independent SLP Estimated rehab  length of stay to reach the above functional goals is: 10-14 days Anticipated discharge destination: Home 10. Overall Rehab/Functional Prognosis: excellent     MD Signature: Sula Soda, MD

## 2023-08-24 NOTE — Progress Notes (Signed)
 Patient transferred from 4N by bed, alert and oriented, hooked to telemetry and assessed.

## 2023-08-24 NOTE — Progress Notes (Signed)
 Referring Physician(s): Dr  Lovett Sox  Supervising Physician: Julieanne Cotton  Patient Status:  [facility] - In-pt  Chief Complaint:  ICH  Subjective:  Left sided weakness NIR procedure 2/25:  Status post bilateral common carotid, right vertebral artery and left subclavian arteriograms. Right radial approach. Findings. 1.  No gross abnormalities identified in terms of aneurysms, dural AV fistula, A/V shunting, dissections, or  intraluminal filling defects. 2.  Dural venous sinuses grossly patent.  Up in bed Still with Left side weakness Says looking to left causes dizziness  Dr Corliss Skains has seen and examined pt today   Allergies: Latex, Nsaids, and Lactose intolerance (gi)  Medications: Prior to Admission medications   Medication Sig Start Date End Date Taking? Authorizing Provider  Ibuprofen (ADVIL) 200 MG CAPS Take by mouth.   Yes [provider]  NYQUIL SEVERE COLD/FLU 5-6.25-10-325 MG/15ML LIQD Take 15-30 mLs by mouth every 6 (six) hours as needed (for cold-like symptoms).   Yes [provider]  TYLENOL 500 MG tablet Take 500-1,000 mg by mouth every 6 (six) hours as needed for mild pain (pain score 1-3) or headache.   Yes [provider]  baclofen (LIORESAL) 10 MG tablet Take 1 tablet (10 mg total) by mouth 2 (two) times daily. 08/24/23   Willeen Niece, MD  hydrOXYzine (ATARAX) 50 MG tablet Take 1 tablet (50 mg total) by mouth every 6 (six) hours as needed for anxiety. 08/24/23   Willeen Niece, MD  nicotine (NICODERM CQ - DOSED IN MG/24 HOURS) 14 mg/24hr patch Place 1 patch (14 mg total) onto the skin daily. 08/25/23   Willeen Niece, MD     Vital Signs: BP 127/83 (BP Location: Left Arm)   Pulse 76   Temp 98 F (36.7 C) (Oral)   Resp 18   Ht 6\' 1"  (1.854 m)   Wt 132 lb 0.9 oz (59.9 kg)   SpO2 99%   BMI 17.42 kg/m   Physical Exam Vitals reviewed.  Constitutional:      Comments: A/O Smile = In NAD  HENT:     Mouth/Throat:      Mouth: Mucous membranes are moist.  Musculoskeletal:     Comments: Left side weakness  Skin:    General: Skin is dry.  Neurological:     Mental Status: He is alert and oriented to person, place, and time.  Psychiatric:        Behavior: Behavior normal.     Imaging: DG HIP PORT UNILAT WITH PELVIS 1V LEFT Result Date: 08/22/2023 CLINICAL DATA:  Left hip pain. EXAM: DG HIP (WITH OR WITHOUT PELVIS) 1V PORT LEFT COMPARISON:  None Available. FINDINGS: Foreshortened appearance of the left femoral neck, likely positional. Repeat radiograph with better positioning of the patient or CT may provide better evaluation if there is high clinical concern for an acute fracture. No dislocation. The bones are well mineralized. No significant arthritic changes. The soft tissues are unremarkable. IMPRESSION: Foreshortened appearance of the left femoral neck, likely positional. No definite acute fracture. Electronically Signed   By: Elgie Collard M.D.   On: 08/22/2023 18:33   ECHOCARDIOGRAM COMPLETE Result Date: 08/22/2023    ECHOCARDIOGRAM REPORT   Patient Name:   Kieran Arreguin. Date of Exam: 08/22/2023 Medical Rec #:  [id number]         Height:       73.0 in Accession #:    [id number]        Weight:  132.1 lb Date of Birth:  22-Sep-1990        BSA:          1.804 m Patient Age:    32 years          BP:           124/80 mmHg Patient Gender: M                 HR:           55 bpm. Exam Location:  Inpatient Procedure: 2D Echo, Cardiac Doppler and Color Doppler (Both Spectral and Color            Flow Doppler were utilized during procedure). Indications:    Stroke I63.9  History:        Patient has no prior history of Echocardiogram examinations.  Sonographer:    Lucendia Herrlich RCS Referring Phys: [id number] ASHISH ARORA IMPRESSIONS  1. Left ventricular ejection fraction, by estimation, is 55 to 60%. The left ventricle has normal function. The left ventricle has no regional wall motion abnormalities. Left  ventricular diastolic parameters are indeterminate.  2. Right ventricular systolic function is normal. The right ventricular size is normal.  3. The mitral valve is normal in structure. No evidence of mitral valve regurgitation. No evidence of mitral stenosis.  4. The aortic valve is tricuspid. There is mild calcification of the aortic valve. Aortic valve regurgitation is not visualized. No aortic stenosis is present.  5. The inferior vena cava is dilated in size with >50% respiratory variability, suggesting right atrial pressure of 8 mmHg. FINDINGS  Left Ventricle: Left ventricular ejection fraction, by estimation, is 55 to 60%. The left ventricle has normal function. The left ventricle has no regional wall motion abnormalities. Strain imaging was not performed. The left ventricular internal cavity  size was normal in size. There is no left ventricular hypertrophy. Left ventricular diastolic parameters are indeterminate. Right Ventricle: The right ventricular size is normal. No increase in right ventricular wall thickness. Right ventricular systolic function is normal. Left Atrium: Left atrial size was normal in size. Right Atrium: Right atrial size was normal in size. Pericardium: There is no evidence of pericardial effusion. Mitral Valve: The mitral valve is normal in structure. No evidence of mitral valve regurgitation. No evidence of mitral valve stenosis. Tricuspid Valve: The tricuspid valve is normal in structure. Tricuspid valve regurgitation is trivial. No evidence of tricuspid stenosis. Aortic Valve: The aortic valve is tricuspid. There is mild calcification of the aortic valve. Aortic valve regurgitation is not visualized. No aortic stenosis is present. Aortic valve peak gradient measures 5.7 mmHg. Pulmonic Valve: The pulmonic valve was normal in structure. Pulmonic valve regurgitation is not visualized. No evidence of pulmonic stenosis. Aorta: The aortic root is normal in size and structure. Venous: The  inferior vena cava is dilated in size with greater than 50% respiratory variability, suggesting right atrial pressure of 8 mmHg. IAS/Shunts: No atrial level shunt detected by color flow Doppler. Additional Comments: 3D imaging was not performed.  LEFT VENTRICLE PLAX 2D LVIDd:         5.10 cm   Diastology LVIDs:         3.30 cm   LV e' medial:    16.40 cm/s LV PW:         0.70 cm   LV E/e' medial:  5.2 LV IVS:        0.80 cm   LV e' lateral:   24.20 cm/s LVOT diam:  2.30 cm   LV E/e' lateral: 3.5 LV SV:         81 LV SV Index:   45 LVOT Area:     4.15 cm  RIGHT VENTRICLE             IVC RV S prime:     15.10 cm/s  IVC diam: 2.60 cm TAPSE (M-mode): 2.6 cm LEFT ATRIUM             Index        RIGHT ATRIUM           Index LA diam:        2.50 cm 1.39 cm/m   RA Area:     14.00 cm LA Vol (A2C):   35.6 ml 19.74 ml/m  RA Volume:   33.00 ml  18.30 ml/m LA Vol (A4C):   36.9 ml 20.46 ml/m LA Biplane Vol: 39.9 ml 22.12 ml/m  AORTIC VALVE AV Area (Vmax): 3.31 cm AV Vmax:        119.00 cm/s AV Peak Grad:   5.7 mmHg LVOT Vmax:      94.80 cm/s LVOT Vmean:     55.300 cm/s LVOT VTI:       0.194 m  AORTA Ao Root diam: 3.50 cm MITRAL VALVE MV Area (PHT): 3.42 cm    SHUNTS MV Decel Time: 222 msec    Systemic VTI:  0.19 m MV E velocity: 85.50 cm/s  Systemic Diam: 2.30 cm MV A velocity: 37.20 cm/s MV E/A ratio:  2.30 Arvilla Meres MD Electronically signed by Arvilla Meres MD Signature Date/Time: 08/22/2023/12:02:46 PM    Final    MR BRAIN W WO CONTRAST Result Date: 08/22/2023 CLINICAL DATA:  Hemorrhagic stroke EXAM: MRI HEAD WITHOUT AND WITH CONTRAST TECHNIQUE: Multiplanar, multiecho pulse sequences of the brain and surrounding structures were obtained without and with intravenous contrast. CONTRAST:  8mL GADAVIST GADOBUTROL 1 MMOL/ML IV SOLN COMPARISON:  None Available. FINDINGS: Brain: Unchanged appearance of intraparenchymal hematoma within the superior right frontal lobe. Moderate surrounding edema. Normal white  matter signal, parenchymal volume and CSF spaces. The midline structures are normal. There is no abnormal contrast enhancement. Vascular: Normal flow voids. Skull and upper cervical spine: Normal calvarium and skull base. Visualized upper cervical spine and soft tissues are normal. Sinuses/Orbits:No paranasal sinus fluid levels or advanced mucosal thickening. No mastoid or middle ear effusion. Normal orbits. IMPRESSION: Unchanged appearance of intraparenchymal hematoma within the superior right frontal lobe. No mass lesion. Electronically Signed   By: Deatra Robinson M.D.   On: 08/22/2023 02:31   CT VENOGRAM HEAD Result Date: 08/21/2023 CLINICAL DATA:  Dural venous sinus thrombosis suspected EXAM: CT VENOGRAM HEAD TECHNIQUE: Venographic phase images of the brain were obtained following the administration of intravenous contrast. Multiplanar reformats and maximum intensity projections were generated. RADIATION DOSE REDUCTION: This exam was performed according to the departmental dose-optimization program which includes automated exposure control, adjustment of the mA and/or kV according to patient size and/or use of iterative reconstruction technique. CONTRAST:  75mL OMNIPAQUE IOHEXOL 350 MG/ML SOLN COMPARISON:  Same-day CT head and head and neck angiogram FINDINGS: No evidence of dural venous sinus thrombosis. The deep cerebral veins including the bilateral veins of Trolard are patent. There is no definite evidence of cortical vein thrombosis. There is increased vascularity along the upper margin of the hemorrhage in the right frontoparietal region, which may be reactive in the increased venous drainage. An additional differential consideration is a subtle dural AV fistula (  series 14, image 114). IMPRESSION: 1. No evidence of dural venous sinus thrombosis. 2. Increased vascularity along the upper margin of the hemorrhage in the right frontoparietal region, which may be reactive in the increased venous drainage. An  additional differential consideration is a subtle dural AV fistula. Recommend further evaluation with a brain MRI with and without contrast. Electronically Signed   By: Lorenza Cambridge M.D.   On: 08/21/2023 17:48   CT C-SPINE NO CHARGE Result Date: 08/21/2023 CLINICAL DATA:  Left-sided weakness, difficulty ambulating, intracranial hemorrhage EXAM: CT CERVICAL SPINE WITHOUT CONTRAST TECHNIQUE: Multidetector CT imaging of the cervical spine was performed without intravenous contrast. Multiplanar CT image reconstructions were also generated. RADIATION DOSE REDUCTION: This exam was performed according to the departmental dose-optimization program which includes automated exposure control, adjustment of the mA and/or kV according to patient size and/or use of iterative reconstruction technique. COMPARISON:  None Available. FINDINGS: Alignment: Alignment is anatomic. Skull base and vertebrae: No acute fracture. No primary bone lesion or focal pathologic process. Soft tissues and spinal canal: No prevertebral fluid or swelling. No visible canal hematoma. Please refer to separately reported CT angiography exam for vascular findings. Disc levels:  No significant spondylosis or facet hypertrophy. Upper chest: Airway is patent. Visualized portions of the lung apices are clear. Other: Reconstructed images demonstrate no additional findings. IMPRESSION: 1. No acute cervical spine fracture. Electronically Signed   By: Sharlet Salina M.D.   On: 08/21/2023 17:30   CT ANGIO HEAD NECK W WO CM (CODE STROKE) Result Date: 08/21/2023 CLINICAL DATA:  Neuro deficit, acute, stroke suspected EXAM: CT ANGIOGRAPHY HEAD AND NECK WITH AND WITHOUT CONTRAST TECHNIQUE: Multidetector CT imaging of the head and neck was performed using the standard protocol during bolus administration of intravenous contrast. Multiplanar CT image reconstructions and MIPs were obtained to evaluate the vascular anatomy. Carotid stenosis measurements (when  applicable) are obtained utilizing NASCET criteria, using the distal internal carotid diameter as the denominator. RADIATION DOSE REDUCTION: This exam was performed according to the departmental dose-optimization program which includes automated exposure control, adjustment of the mA and/or kV according to patient size and/or use of iterative reconstruction technique. CONTRAST:  Iodinated contrast was used to improve disease detection COMPARISON:  CT head 04/21/19 FINDINGS: CT HEAD FINDINGS See same day CT head for intracranial findings CTA NECK FINDINGS Aortic arch: Standard branching. Imaged portion shows no evidence of aneurysm or dissection. No significant stenosis of the major arch vessel origins. Right carotid system: No evidence of dissection, stenosis (50% or greater), or occlusion. Left carotid system: No evidence of dissection, stenosis (50% or greater), or occlusion. Vertebral arteries: Codominant. No evidence of dissection, stenosis (50% or greater), or occlusion. Skeleton: Negative. Other neck: Negative. Upper chest: Negative. Review of the MIP images confirms the above findings CTA HEAD FINDINGS Anterior circulation: No significant stenosis, proximal occlusion, aneurysm, or vascular malformation. Posterior circulation: No significant stenosis, proximal occlusion, aneurysm, or vascular malformation. Venous sinuses: See separately dictated CT venogram Anatomic variants: None Review of the MIP images confirms the above findings IMPRESSION: 1. No intracranial large vessel occlusion or significant stenosis. 2. No hemodynamically significant stenosis in the neck. 3. Poor visualization of the vein of Trolard on the right. see separately dictated CT venogram for additional findings Electronically Signed   By: Lorenza Cambridge M.D.   On: 08/21/2023 17:04   CT HEAD CODE STROKE WO CONTRAST Result Date: 08/21/2023 CLINICAL DATA:  Code stroke.  Stroke-like symptoms EXAM: CT HEAD WITHOUT CONTRAST TECHNIQUE:  Contiguous  axial images were obtained from the base of the skull through the vertex without intravenous contrast. RADIATION DOSE REDUCTION: This exam was performed according to the departmental dose-optimization program which includes automated exposure control, adjustment of the mA and/or kV according to patient size and/or use of iterative reconstruction technique. COMPARISON:  None Available. FINDINGS: Brain: 3.9 x 2.4 x 4.1 cm parenchymal hemorrhage centered at the right frontoparietal junction. No evidence of intraventricular extension. No mass effect. No mass lesion no CT evidence of an acute cortical infarct. Vascular: No hyperdense vessel or unexpected calcification. Skull: Normal. Negative for fracture or focal lesion. Sinuses/Orbits: No middle ear or mastoid effusion. Paranasal sinuses are notable for polypoid mucosal thickening in the right maxillary sinus. Orbits are unremarkable. Other: None. ASPECTS [facility] Stroke Program Early CT Score): 10 IMPRESSION: 3.9 x 2.4 x 4.1 cm parenchymal hemorrhage centered at the right frontoparietal junction. No evidence of intraventricular extension. No mass effect. Findings were discussed with Dr. Wilford Corner on 08/21/23 at 4:50 PM. Electronically Signed   By: Lorenza Cambridge M.D.   On: 08/21/2023 16:53    Labs:  CBC: Recent Labs    09/18/22 1955 08/21/23 1638 08/21/23 1641 08/23/23 0458 08/24/23 0605  WBC 11.1* 5.0  --  9.5 8.9  HGB 15.4 16.9 16.7 16.6 16.0  HCT 44.0 48.8 49.0 46.7 45.0  PLT 333 319  --  320 317    COAGS: Recent Labs    08/21/23 1638  INR 1.1  APTT 27    BMP: Recent Labs    09/18/22 1955 08/21/23 1638 08/21/23 1641 08/23/23 0458 08/24/23 0605  NA 137 137 140 137 136  K 3.8 3.8 3.8 3.5 3.2*  CL 101 102 100 104 104  CO2 31 23  --  26 22  GLUCOSE 87 157* 149* 103* 103*  BUN 12 6 6 9 9   CALCIUM 9.9 9.7  --  9.2 9.2  CREATININE 0.96 1.03 1.00 0.98 0.87  GFRNONAA >60 >60  --  >60 >60    LIVER FUNCTION TESTS: Recent  Labs    09/18/22 1955 08/21/23 1638  BILITOT 0.3 1.0  AST 14* 22  ALT 15 16  ALKPHOS 53 52  PROT 7.0 7.4  ALBUMIN 4.2 4.2    Assessment and Plan:  Intra cranial hemorrhage Cerebral arteriogram 2/25 NIR will follow Plans per Rehab Pt will hear from [facility] scheduler for time and date of 3 week re angiogram NIR Recommends: 2 week CT Head without contrast before DC from Rehab- please order when appropriate  Electronically Signed: Robet Leu, PA-C 08/24/2023, 2:19 PM   I spent a total of 15 Minutes at the the patient's bedside AND on the patient's hospital floor or unit, greater than 50% of which was counseling/coordinating care for [facility]

## 2023-08-24 NOTE — Discharge Summary (Signed)
 Physician Discharge Summary  Erik Santana. ZOX:[id number] DOB: 1990/09/21 DOA: 08/21/2023  PCP: Patient, No Pcp Per  Admit date: 08/21/2023  Discharge date: 08/24/2023  Admitted From: Home.  Disposition:  Acute Inpatient Rehab.  Recommendations for Outpatient Follow-up:  Follow up with PCP in 1-2 weeks. Please obtain BMP/CBC in one week Patient is being discharged to acute inpatient rehab.  Home Health:None Equipment/Devices:None  Discharge Condition: Stable CODE STATUS:Full code Diet recommendation: Heart Healthy   Brief [facility] Course: [name]. is a 33 y.o. male with history of mallory weiss tear in 2014, anxiety, ADHD, and GERD presenting with left side weakness. NIH on Admission 7. CT head showed ICH in right fronto parietal region likely to be embolic in nature. MRI showed unchanged appearance of intraparenchymal hematoma within the superior right frontal lobe. No mass lesion. Echo showed LVEF 55-60%, Patient underwent cerebral angiogram which showed No gross abnormalities identified in terms of aneurysms, dural AV fistula, A/V shunting, dissections, or  intraluminal filling defects.Dural venous sinuses grossly patent. Neurology recommended no antithrombotic due to acute ICH. Patient cleared for discharge to acute inpatient rehab.Neurology signed off.   Intracerebral Hemorrhage:  right frontoparietal  Etiology: Unclear workup underway Code Stroke CT head ICH in  right frontoparietal junction. CTA head & neck no LVO CTV no evidence of dural venous sinus thrombosis MRI  Unchanged appearance of intraparenchymal hematoma within the superior right frontal lobe. No mass lesion.  2D Echo EF 55 to 60% Scheduled for cerebral angiogram today LDL 119 HgbA1c 5.3 VTE prophylaxis -SCDs No antithrombotic prior to admission, now on No antithrombotic due to acute ICH Therapy recommendations:  CIR. Disposition: Acute inpatient rehab.     Hypertension Home meds:  None Stable Not requiring any BP meds Blood Pressure Goal: SBP between 130-150 for 24 hours and then less than 160    Hyperlipidemia Home meds: None LDL 119, goal < 70 Consider adding statin at discharge   Tobacco Abuse Patient smokes half a pack per day for       Ready to quit? No Nicotine replacement therapy provided   Substance Abuse Patient uses Ativan and Percocet off the streets UDS positive for  [facility]      Ready to quit? No TOC consult for cessation placed   Dysphagia Patient has post-stroke dysphagia, SLP consulted      Diet    Diet NPO time specified Except for: Sips with Meds        Advance diet as tolerated   Other Stroke Risk Factors ETOH use, alcohol level <10, advised to drink no more than 2 drink(s) a day  Discharge Diagnoses:  Principal Problem:   Non-traumatic intracranial hemorrhage [facility])    Discharge Instructions  Discharge Instructions     Call MD for:  difficulty breathing, headache or visual disturbances   Complete by: As directed    Call MD for:  persistant dizziness or light-headedness   Complete by: As directed    Call MD for:  persistant nausea and vomiting   Complete by: As directed    Diet - low sodium heart healthy   Complete by: As directed    Diet Carb Modified   Complete by: As directed    Discharge instructions   Complete by: As directed    Advised to follow-up with primary care physician in 1 week. Patient is being discharged to acute inpatient rehab.   Increase activity slowly   Complete by: As directed    No wound care  Complete by: As directed       Allergies as of 08/24/2023       Reactions   Latex Hives   Nsaids Other (See Comments)   History of a Mallory-Weiss tear (had surgery to fix this 10+ years ago)   Lactose Intolerance (gi) Diarrhea        Medication List     STOP taking these medications    Advil 200 MG Caps Generic drug: Ibuprofen   NyQuil Severe Cold/Flu 5-6.25-10-325 MG/15ML  Liqd Generic drug: Phenyleph-Doxylamine-DM-APAP       TAKE these medications    baclofen 10 MG tablet Commonly known as: LIORESAL Take 1 tablet (10 mg total) by mouth 2 (two) times daily.   hydrOXYzine 50 MG tablet Commonly known as: ATARAX Take 1 tablet (50 mg total) by mouth every 6 (six) hours as needed for anxiety.   nicotine 14 mg/24hr patch Commonly known as: NICODERM CQ - dosed in mg/24 hours Place 1 patch (14 mg total) onto the skin daily. Start taking on: August 25, 2023   TYLENOL 500 MG tablet Generic drug: acetaminophen Take 500-1,000 mg by mouth every 6 (six) hours as needed for mild pain (pain score 1-3) or headache.        Follow-up Information     [facility] Follow up in 2 week(s).   Contact information: [address] [place] [address] [phone]                Allergies  Allergen Reactions   Latex Hives   Nsaids Other (See Comments)    History of a Mallory-Weiss tear (had surgery to fix this 10+ years ago)   Lactose Intolerance (Gi) Diarrhea    Consultations: Neurology IR   Procedures/Studies: DG HIP PORT UNILAT WITH PELVIS 1V LEFT Result Date: 08/22/2023 CLINICAL DATA:  Left hip pain. EXAM: DG HIP (WITH OR WITHOUT PELVIS) 1V PORT LEFT COMPARISON:  None Available. FINDINGS: Foreshortened appearance of the left femoral neck, likely positional. Repeat radiograph with better positioning of the patient or CT may provide better evaluation if there is high clinical concern for an acute fracture. No dislocation. The bones are well mineralized. No significant arthritic changes. The soft tissues are unremarkable. IMPRESSION: Foreshortened appearance of the left femoral neck, likely positional. No definite acute fracture. Electronically Signed   By: Elgie Collard M.D.   On: 08/22/2023 18:33   ECHOCARDIOGRAM COMPLETE Result Date: 08/22/2023    ECHOCARDIOGRAM REPORT   Patient Name:   Erik Santana. Date  of Exam: 08/22/2023 Medical Rec #:  [id number]         Height:       73.0 in Accession #:    [id number]        Weight:       132.1 lb Date of Birth:  January 16, 1991        BSA:          1.804 m Patient Age:    32 years          BP:           124/80 mmHg Patient Gender: M                 HR:           55 bpm. Exam Location:  Inpatient Procedure: 2D Echo, Cardiac Doppler and Color Doppler (Both Spectral and Color            Flow Doppler were utilized during  procedure). Indications:    Stroke I63.9  History:        Patient has no prior history of Echocardiogram examinations.  Sonographer:    Lucendia Herrlich RCS Referring Phys: [id number] ASHISH ARORA IMPRESSIONS  1. Left ventricular ejection fraction, by estimation, is 55 to 60%. The left ventricle has normal function. The left ventricle has no regional wall motion abnormalities. Left ventricular diastolic parameters are indeterminate.  2. Right ventricular systolic function is normal. The right ventricular size is normal.  3. The mitral valve is normal in structure. No evidence of mitral valve regurgitation. No evidence of mitral stenosis.  4. The aortic valve is tricuspid. There is mild calcification of the aortic valve. Aortic valve regurgitation is not visualized. No aortic stenosis is present.  5. The inferior vena cava is dilated in size with >50% respiratory variability, suggesting right atrial pressure of 8 mmHg. FINDINGS  Left Ventricle: Left ventricular ejection fraction, by estimation, is 55 to 60%. The left ventricle has normal function. The left ventricle has no regional wall motion abnormalities. Strain imaging was not performed. The left ventricular internal cavity  size was normal in size. There is no left ventricular hypertrophy. Left ventricular diastolic parameters are indeterminate. Right Ventricle: The right ventricular size is normal. No increase in right ventricular wall thickness. Right ventricular systolic function is normal. Left Atrium: Left atrial  size was normal in size. Right Atrium: Right atrial size was normal in size. Pericardium: There is no evidence of pericardial effusion. Mitral Valve: The mitral valve is normal in structure. No evidence of mitral valve regurgitation. No evidence of mitral valve stenosis. Tricuspid Valve: The tricuspid valve is normal in structure. Tricuspid valve regurgitation is trivial. No evidence of tricuspid stenosis. Aortic Valve: The aortic valve is tricuspid. There is mild calcification of the aortic valve. Aortic valve regurgitation is not visualized. No aortic stenosis is present. Aortic valve peak gradient measures 5.7 mmHg. Pulmonic Valve: The pulmonic valve was normal in structure. Pulmonic valve regurgitation is not visualized. No evidence of pulmonic stenosis. Aorta: The aortic root is normal in size and structure. Venous: The inferior vena cava is dilated in size with greater than 50% respiratory variability, suggesting right atrial pressure of 8 mmHg. IAS/Shunts: No atrial level shunt detected by color flow Doppler. Additional Comments: 3D imaging was not performed.  LEFT VENTRICLE PLAX 2D LVIDd:         5.10 cm   Diastology LVIDs:         3.30 cm   LV e' medial:    16.40 cm/s LV PW:         0.70 cm   LV E/e' medial:  5.2 LV IVS:        0.80 cm   LV e' lateral:   24.20 cm/s LVOT diam:     2.30 cm   LV E/e' lateral: 3.5 LV SV:         81 LV SV Index:   45 LVOT Area:     4.15 cm  RIGHT VENTRICLE             IVC RV S prime:     15.10 cm/s  IVC diam: 2.60 cm TAPSE (M-mode): 2.6 cm LEFT ATRIUM             Index        RIGHT ATRIUM           Index LA diam:        2.50 cm 1.39 cm/m  RA Area:     14.00 cm LA Vol (A2C):   35.6 ml 19.74 ml/m  RA Volume:   33.00 ml  18.30 ml/m LA Vol (A4C):   36.9 ml 20.46 ml/m LA Biplane Vol: 39.9 ml 22.12 ml/m  AORTIC VALVE AV Area (Vmax): 3.31 cm AV Vmax:        119.00 cm/s AV Peak Grad:   5.7 mmHg LVOT Vmax:      94.80 cm/s LVOT Vmean:     55.300 cm/s LVOT VTI:       0.194 m   AORTA Ao Root diam: 3.50 cm MITRAL VALVE MV Area (PHT): 3.42 cm    SHUNTS MV Decel Time: 222 msec    Systemic VTI:  0.19 m MV E velocity: 85.50 cm/s  Systemic Diam: 2.30 cm MV A velocity: 37.20 cm/s MV E/A ratio:  2.30 Arvilla Meres MD Electronically signed by Arvilla Meres MD Signature Date/Time: 08/22/2023/12:02:46 PM    Final    MR BRAIN W WO CONTRAST Result Date: 08/22/2023 CLINICAL DATA:  Hemorrhagic stroke EXAM: MRI HEAD WITHOUT AND WITH CONTRAST TECHNIQUE: Multiplanar, multiecho pulse sequences of the brain and surrounding structures were obtained without and with intravenous contrast. CONTRAST:  8mL GADAVIST GADOBUTROL 1 MMOL/ML IV SOLN COMPARISON:  None Available. FINDINGS: Brain: Unchanged appearance of intraparenchymal hematoma within the superior right frontal lobe. Moderate surrounding edema. Normal white matter signal, parenchymal volume and CSF spaces. The midline structures are normal. There is no abnormal contrast enhancement. Vascular: Normal flow voids. Skull and upper cervical spine: Normal calvarium and skull base. Visualized upper cervical spine and soft tissues are normal. Sinuses/Orbits:No paranasal sinus fluid levels or advanced mucosal thickening. No mastoid or middle ear effusion. Normal orbits. IMPRESSION: Unchanged appearance of intraparenchymal hematoma within the superior right frontal lobe. No mass lesion. Electronically Signed   By: Deatra Robinson M.D.   On: 08/22/2023 02:31   CT VENOGRAM HEAD Result Date: 08/21/2023 CLINICAL DATA:  Dural venous sinus thrombosis suspected EXAM: CT VENOGRAM HEAD TECHNIQUE: Venographic phase images of the brain were obtained following the administration of intravenous contrast. Multiplanar reformats and maximum intensity projections were generated. RADIATION DOSE REDUCTION: This exam was performed according to the departmental dose-optimization program which includes automated exposure control, adjustment of the mA and/or kV according to  patient size and/or use of iterative reconstruction technique. CONTRAST:  75mL OMNIPAQUE IOHEXOL 350 MG/ML SOLN COMPARISON:  Same-day CT head and head and neck angiogram FINDINGS: No evidence of dural venous sinus thrombosis. The deep cerebral veins including the bilateral veins of Trolard are patent. There is no definite evidence of cortical vein thrombosis. There is increased vascularity along the upper margin of the hemorrhage in the right frontoparietal region, which may be reactive in the increased venous drainage. An additional differential consideration is a subtle dural AV fistula (series 14, image 114). IMPRESSION: 1. No evidence of dural venous sinus thrombosis. 2. Increased vascularity along the upper margin of the hemorrhage in the right frontoparietal region, which may be reactive in the increased venous drainage. An additional differential consideration is a subtle dural AV fistula. Recommend further evaluation with a brain MRI with and without contrast. Electronically Signed   By: Lorenza Cambridge M.D.   On: 08/21/2023 17:48   CT C-SPINE NO CHARGE Result Date: 08/21/2023 CLINICAL DATA:  Left-sided weakness, difficulty ambulating, intracranial hemorrhage EXAM: CT CERVICAL SPINE WITHOUT CONTRAST TECHNIQUE: Multidetector CT imaging of the cervical spine was performed without intravenous contrast. Multiplanar CT image reconstructions were also generated.  RADIATION DOSE REDUCTION: This exam was performed according to the departmental dose-optimization program which includes automated exposure control, adjustment of the mA and/or kV according to patient size and/or use of iterative reconstruction technique. COMPARISON:  None Available. FINDINGS: Alignment: Alignment is anatomic. Skull base and vertebrae: No acute fracture. No primary bone lesion or focal pathologic process. Soft tissues and spinal canal: No prevertebral fluid or swelling. No visible canal hematoma. Please refer to separately reported CT  angiography exam for vascular findings. Disc levels:  No significant spondylosis or facet hypertrophy. Upper chest: Airway is patent. Visualized portions of the lung apices are clear. Other: Reconstructed images demonstrate no additional findings. IMPRESSION: 1. No acute cervical spine fracture. Electronically Signed   By: Sharlet Salina M.D.   On: 08/21/2023 17:30   CT ANGIO HEAD NECK W WO CM (CODE STROKE) Result Date: 08/21/2023 CLINICAL DATA:  Neuro deficit, acute, stroke suspected EXAM: CT ANGIOGRAPHY HEAD AND NECK WITH AND WITHOUT CONTRAST TECHNIQUE: Multidetector CT imaging of the head and neck was performed using the standard protocol during bolus administration of intravenous contrast. Multiplanar CT image reconstructions and MIPs were obtained to evaluate the vascular anatomy. Carotid stenosis measurements (when applicable) are obtained utilizing NASCET criteria, using the distal internal carotid diameter as the denominator. RADIATION DOSE REDUCTION: This exam was performed according to the departmental dose-optimization program which includes automated exposure control, adjustment of the mA and/or kV according to patient size and/or use of iterative reconstruction technique. CONTRAST:  Iodinated contrast was used to improve disease detection COMPARISON:  CT head 04/21/19 FINDINGS: CT HEAD FINDINGS See same day CT head for intracranial findings CTA NECK FINDINGS Aortic arch: Standard branching. Imaged portion shows no evidence of aneurysm or dissection. No significant stenosis of the major arch vessel origins. Right carotid system: No evidence of dissection, stenosis (50% or greater), or occlusion. Left carotid system: No evidence of dissection, stenosis (50% or greater), or occlusion. Vertebral arteries: Codominant. No evidence of dissection, stenosis (50% or greater), or occlusion. Skeleton: Negative. Other neck: Negative. Upper chest: Negative. Review of the MIP images confirms the above findings CTA  HEAD FINDINGS Anterior circulation: No significant stenosis, proximal occlusion, aneurysm, or vascular malformation. Posterior circulation: No significant stenosis, proximal occlusion, aneurysm, or vascular malformation. Venous sinuses: See separately dictated CT venogram Anatomic variants: None Review of the MIP images confirms the above findings IMPRESSION: 1. No intracranial large vessel occlusion or significant stenosis. 2. No hemodynamically significant stenosis in the neck. 3. Poor visualization of the vein of Trolard on the right. see separately dictated CT venogram for additional findings Electronically Signed   By: Lorenza Cambridge M.D.   On: 08/21/2023 17:04   CT HEAD CODE STROKE WO CONTRAST Result Date: 08/21/2023 CLINICAL DATA:  Code stroke.  Stroke-like symptoms EXAM: CT HEAD WITHOUT CONTRAST TECHNIQUE: Contiguous axial images were obtained from the base of the skull through the vertex without intravenous contrast. RADIATION DOSE REDUCTION: This exam was performed according to the departmental dose-optimization program which includes automated exposure control, adjustment of the mA and/or kV according to patient size and/or use of iterative reconstruction technique. COMPARISON:  None Available. FINDINGS: Brain: 3.9 x 2.4 x 4.1 cm parenchymal hemorrhage centered at the right frontoparietal junction. No evidence of intraventricular extension. No mass effect. No mass lesion no CT evidence of an acute cortical infarct. Vascular: No hyperdense vessel or unexpected calcification. Skull: Normal. Negative for fracture or focal lesion. Sinuses/Orbits: No middle ear or mastoid effusion. Paranasal sinuses are notable for polypoid  mucosal thickening in the right maxillary sinus. Orbits are unremarkable. Other: None. ASPECTS [facility] Stroke Program Early CT Score): 10 IMPRESSION: 3.9 x 2.4 x 4.1 cm parenchymal hemorrhage centered at the right frontoparietal junction. No evidence of intraventricular extension. No  mass effect. Findings were discussed with Dr. Wilford Corner on 08/21/23 at 4:50 PM. Electronically Signed   By: Lorenza Cambridge M.D.   On: 08/21/2023 16:53      Subjective: Patient seen and examined, He is doing better, He has left sided weakness. He is being discharged to acute inpatient rehab.  Discharge Exam: Vitals:   08/24/23 0747 08/24/23 1153  BP: (!) 125/95 127/83  Pulse: (!) 51 76  Resp:    Temp: 98 F (36.7 C) 98 F (36.7 C)  SpO2: 98% 99%   Vitals:   08/23/23 2356 08/24/23 0338 08/24/23 0747 08/24/23 1153  BP: 124/76 127/81 (!) 125/95 127/83  Pulse: (!) 52 (!) 48 (!) 51 76  Resp: 17 18    Temp: 98.2 F (36.8 C) 98.6 F (37 C) 98 F (36.7 C) 98 F (36.7 C)  TempSrc: Oral Oral Oral Oral  SpO2: 98% 97% 98% 99%  Weight:      Height:        General: Pt is alert, awake, not in acute distress Cardiovascular: RRR, S1/S2 +, no rubs, no gallops Respiratory: CTA bilaterally, no wheezing, no rhonchi Abdominal: Soft, NT, ND, bowel sounds + Extremities: Left sided weakness. no edema, no cyanosis    The results of significant diagnostics from this hospitalization (including imaging, microbiology, ancillary and laboratory) are listed below for reference.     Microbiology: Recent Results (from the past 240 hours)  MRSA Next Gen by PCR, Nasal     Status: None   Collection Time: 08/21/23  8:18 PM   Specimen: Nasal Mucosa; Nasal Swab  Result Value Ref Range Status   MRSA by PCR Next Gen NOT DETECTED NOT DETECTED Final    Comment: (NOTE) The GeneXpert MRSA Assay (FDA approved for NASAL specimens only), is one component of a comprehensive MRSA colonization surveillance program. It is not intended to diagnose MRSA infection nor to guide or monitor treatment for MRSA infections. Test performance is not FDA approved in patients less than 60 years old. Performed at [facility], [address] [place], [place] [address]      Labs: BNP (last 3 results) No results for  input(s): "BNP" in the last 8760 hours. Basic Metabolic Panel: Recent Labs  Lab 08/21/23 1638 08/21/23 1641 08/23/23 0458 08/24/23 0605  NA 137 140 137 136  K 3.8 3.8 3.5 3.2*  CL 102 100 104 104  CO2 23  --  26 22  GLUCOSE 157* 149* 103* 103*  BUN 6 6 9 9   CREATININE 1.03 1.00 0.98 0.87  CALCIUM 9.7  --  9.2 9.2   Liver Function Tests: Recent Labs  Lab 08/21/23 1638  AST 22  ALT 16  ALKPHOS 52  BILITOT 1.0  PROT 7.4  ALBUMIN 4.2   No results for input(s): "LIPASE", "AMYLASE" in the last 168 hours. No results for input(s): "AMMONIA" in the last 168 hours. CBC: Recent Labs  Lab 08/21/23 1638 08/21/23 1641 08/23/23 0458 08/24/23 0605  WBC 5.0  --  9.5 8.9  NEUTROABS 3.0  --   --   --   HGB 16.9 16.7 16.6 16.0  HCT 48.8 49.0 46.7 45.0  MCV 83.6  --  81.9 82.4  PLT 319  --  320 317   Cardiac Enzymes: Recent Labs  Lab 08/21/23 1637  CKTOTAL 101   BNP: Invalid input(s): "POCBNP" CBG: Recent Labs  Lab 08/21/23 1633 08/22/23 0725  GLUCAP 170* 153*   D-Dimer No results for input(s): "DDIMER" in the last 72 hours. Hgb A1c Recent Labs    08/22/23 0430  HGBA1C 5.3   Lipid Profile Recent Labs    08/22/23 0430  CHOL 172  HDL 35*  LDLCALC 119*  TRIG 88  CHOLHDL 4.9   Thyroid function studies No results for input(s): "TSH", "T4TOTAL", "T3FREE", "THYROIDAB" in the last 72 hours.  Invalid input(s): "FREET3" Anemia work up No results for input(s): "VITAMINB12", "FOLATE", "FERRITIN", "TIBC", "IRON", "RETICCTPCT" in the last 72 hours. Urinalysis    Component Value Date/Time   COLORURINE COLORLESS (A) 09/18/2022 1828   APPEARANCEUR CLEAR 09/18/2022 1828   LABSPEC 1.016 09/18/2022 1828   PHURINE 6.5 09/18/2022 1828   GLUCOSEU NEGATIVE 09/18/2022 1828   HGBUR NEGATIVE 09/18/2022 1828   BILIRUBINUR NEGATIVE 09/18/2022 1828   KETONESUR NEGATIVE 09/18/2022 1828   PROTEINUR NEGATIVE 09/18/2022 1828   UROBILINOGEN 0.2 09/20/2015 1756   NITRITE  NEGATIVE 09/18/2022 1828   LEUKOCYTESUR NEGATIVE 09/18/2022 1828   Sepsis Labs Recent Labs  Lab 08/21/23 1638 08/23/23 0458 08/24/23 0605  WBC 5.0 9.5 8.9   Microbiology Recent Results (from the past 240 hours)  MRSA Next Gen by PCR, Nasal     Status: None   Collection Time: 08/21/23  8:18 PM   Specimen: Nasal Mucosa; Nasal Swab  Result Value Ref Range Status   MRSA by PCR Next Gen NOT DETECTED NOT DETECTED Final    Comment: (NOTE) The GeneXpert MRSA Assay (FDA approved for NASAL specimens only), is one component of a comprehensive MRSA colonization surveillance program. It is not intended to diagnose MRSA infection nor to guide or monitor treatment for MRSA infections. Test performance is not FDA approved in patients less than 20 years old. Performed at [facility], [address], [place], [place] [address]      Time coordinating discharge: Over 30 minutes  SIGNED:   Willeen Niece, MD  [facility] 08/24/2023, 6:24 PM Pager   If 7PM-7AM, please contact night-coverage

## 2023-08-24 NOTE — Discharge Instructions (Signed)
 Advised to follow-up with primary care physician in 1 week. Patient is being discharged to acute inpatient rehab.

## 2023-08-25 DIAGNOSIS — I612 Nontraumatic intracerebral hemorrhage in hemisphere, unspecified: Secondary | ICD-10-CM

## 2023-08-25 LAB — COMPREHENSIVE METABOLIC PANEL
ALT: 14 U/L (ref 0–44)
AST: 16 U/L (ref 15–41)
Albumin: 4.2 g/dL (ref 3.5–5.0)
Alkaline Phosphatase: 50 U/L (ref 38–126)
Anion gap: 12 (ref 5–15)
BUN: 8 mg/dL (ref 6–20)
CO2: 24 mmol/L (ref 22–32)
Calcium: 9.5 mg/dL (ref 8.9–10.3)
Chloride: 102 mmol/L (ref 98–111)
Creatinine, Ser: 0.92 mg/dL (ref 0.61–1.24)
GFR, Estimated: >60 mL/min (ref >60)
Glucose, Bld: 100 mg/dL — ABNORMAL HIGH (ref 70–99)
Potassium: 3.4 mmol/L — ABNORMAL LOW (ref 3.5–5.1)
Sodium: 138 mmol/L (ref 135–145)
Total Bilirubin: 0.8 mg/dL (ref 0.0–1.2)
Total Protein: 7.4 g/dL (ref 6.5–8.1)

## 2023-08-25 LAB — CBC WITH DIFFERENTIAL/PLATELET
Abs Immature Granulocytes: 0.02 10*3/uL (ref 0.00–0.07)
Basophils Absolute: 0.1 10*3/uL (ref 0.0–0.1)
Basophils Relative: 1 %
Eosinophils Absolute: 0.3 10*3/uL (ref 0.0–0.5)
Eosinophils Relative: 3 %
HCT: 46.8 % (ref 39.0–52.0)
Hemoglobin: 16.7 g/dL (ref 13.0–17.0)
Immature Granulocytes: 0 %
Lymphocytes Relative: 28 %
Lymphs Abs: 2.2 10*3/uL (ref 0.7–4.0)
MCH: 29.2 pg (ref 26.0–34.0)
MCHC: 35.7 g/dL (ref 30.0–36.0)
MCV: 81.8 fL (ref 80.0–100.0)
Monocytes Absolute: 0.7 10*3/uL (ref 0.1–1.0)
Monocytes Relative: 9 %
Neutro Abs: 4.7 10*3/uL (ref 1.7–7.7)
Neutrophils Relative %: 59 %
Platelets: 333 10*3/uL (ref 150–400)
RBC: 5.72 MIL/uL (ref 4.22–5.81)
RDW: 12.4 % (ref 11.5–15.5)
WBC: 8 10*3/uL (ref 4.0–10.5)
nRBC: 0 % (ref 0.0–0.2)

## 2023-08-25 MED ORDER — MECLIZINE HCL 25 MG PO TABS
25.0000 mg | ORAL_TABLET | Freq: Three times a day (TID) | ORAL | Status: DC | PRN
  Administered 2023-08-26 – 2023-09-20 (×25): 25 mg via ORAL
  Filled 2023-08-25 (×26): qty 1

## 2023-08-25 MED ORDER — BACLOFEN 10 MG PO TABS
10.0000 mg | ORAL_TABLET | Freq: Three times a day (TID) | ORAL | Status: DC
  Administered 2023-08-25 – 2023-09-01 (×22): 10 mg via ORAL
  Filled 2023-08-25 (×22): qty 1

## 2023-08-25 MED ORDER — LOPERAMIDE HCL 2 MG PO CAPS: 2.0000 mg | ORAL_CAPSULE | ORAL | Status: DC | PRN

## 2023-08-25 MED ORDER — SCOPOLAMINE 1 MG/3DAYS TD PT72
1.0000 | MEDICATED_PATCH | TRANSDERMAL | Status: DC
  Administered 2023-08-25 – 2023-09-06 (×5): 1.5 mg via TRANSDERMAL
  Filled 2023-08-25 (×7): qty 1

## 2023-08-25 MED ORDER — MELATONIN 5 MG PO TABS
5.0000 mg | ORAL_TABLET | Freq: Every evening | ORAL | Status: DC | PRN
  Administered 2023-09-02: 5 mg via ORAL
  Filled 2023-08-25: qty 1

## 2023-08-25 MED ORDER — TRAZODONE HCL 50 MG PO TABS
100.0000 mg | ORAL_TABLET | Freq: Every day | ORAL | Status: DC
  Administered 2023-08-25 – 2023-09-21 (×28): 100 mg via ORAL
  Filled 2023-08-25 (×28): qty 2

## 2023-08-25 MED ORDER — TIZANIDINE HCL 4 MG PO TABS
2.0000 mg | ORAL_TABLET | Freq: Three times a day (TID) | ORAL | Status: DC | PRN
  Administered 2023-08-27 – 2023-08-28 (×2): 2 mg via ORAL
  Filled 2023-08-25 (×2): qty 1

## 2023-08-25 MED ORDER — ACETAMINOPHEN 325 MG PO TABS
325.0000 mg | ORAL_TABLET | Freq: Four times a day (QID) | ORAL | Status: DC | PRN
  Administered 2023-08-26 – 2023-09-12 (×7): 650 mg via ORAL
  Filled 2023-08-25 (×8): qty 2

## 2023-08-25 MED ORDER — TRAMADOL HCL 50 MG PO TABS
50.0000 mg | ORAL_TABLET | Freq: Four times a day (QID) | ORAL | Status: DC | PRN
  Administered 2023-08-27 – 2023-09-22 (×46): 50 mg via ORAL
  Filled 2023-08-25 (×46): qty 1

## 2023-08-25 NOTE — Progress Notes (Signed)
 Orthopedic Tech Progress Note Patient Details:  Erik Santana. 18-Apr-1991 [id number]  Order for a L resting WHO called into [facility].  Patient ID: Erik Santana., male   DOB: 1990-08-19, 33 y.o.   MRN: [id number]  Erik Santana 08/25/2023, 11:13 AM

## 2023-08-25 NOTE — Plan of Care (Signed)
  Problem: RH BOWEL ELIMINATION Goal: RH STG MANAGE BOWEL WITH ASSISTANCE Description: STG Manage Bowel with mod I  Assistance. Outcome: Progressing   Problem: RH SAFETY Goal: RH STG ADHERE TO SAFETY PRECAUTIONS W/ASSISTANCE/DEVICE Description: STG Adhere to Safety Precautions With cues Assistance/Device. Outcome: Progressing   Problem: RH PAIN MANAGEMENT Goal: RH STG PAIN MANAGED AT OR BELOW PT'S PAIN GOAL Description: < 4 with prns Outcome: Progressing

## 2023-08-25 NOTE — Progress Notes (Signed)
 Inpatient Rehabilitation  Patient information reviewed and entered into eRehab system by Cheri Rous, OTR/L, Rehab Quality Coordinator.   Information including medical coding, functional ability and quality indicators will be reviewed and updated through discharge.

## 2023-08-25 NOTE — Plan of Care (Signed)
 Problem: RH Balance Goal: LTG: Patient will maintain dynamic sitting balance (OT) Description: LTG:  Patient will maintain dynamic sitting balance with assistance during activities of daily living (OT) Flowsheets (Taken 08/25/2023 1210) LTG: Pt will maintain dynamic sitting balance during ADLs with: Supervision/Verbal cueing Goal: LTG Patient will maintain dynamic standing with ADLs (OT) Description: LTG:  Patient will maintain dynamic standing balance with assist during activities of daily living (OT)  Flowsheets (Taken 08/25/2023 1210) LTG: Pt will maintain dynamic standing balance during ADLs with: Contact Guard/Touching assist   Problem: Sit to Stand Goal: LTG:  Patient will perform sit to stand in prep for activites of daily living with assistance level (OT) Description: LTG:  Patient will perform sit to stand in prep for activites of daily living with assistance level (OT) Flowsheets (Taken 08/25/2023 1210) LTG: PT will perform sit to stand in prep for activites of daily living with assistance level: Supervision/Verbal cueing   Problem: RH Grooming Goal: LTG Patient will perform grooming w/assist,cues/equip (OT) Description: LTG: Patient will perform grooming with assist, with/without cues using equipment (OT) Flowsheets (Taken 08/25/2023 1210) LTG: Pt will perform grooming with assistance level of: Supervision/Verbal cueing   Problem: RH Bathing Goal: LTG Patient will bathe all body parts with assist levels (OT) Description: LTG: Patient will bathe all body parts with assist levels (OT) Flowsheets (Taken 08/25/2023 1210) LTG: Pt will perform bathing with assistance level/cueing: Minimal Assistance - Patient > 75%   Problem: RH Dressing Goal: LTG Patient will perform upper body dressing (OT) Description: LTG Patient will perform upper body dressing with assist, with/without cues (OT). Flowsheets (Taken 08/25/2023 1210) LTG: Pt will perform upper body dressing with assistance level  of: Supervision/Verbal cueing Goal: LTG Patient will perform lower body dressing w/assist (OT) Description: LTG: Patient will perform lower body dressing with assist, with/without cues in positioning using equipment (OT) Flowsheets (Taken 08/25/2023 1210) LTG: Pt will perform lower body dressing with assistance level of: Minimal Assistance - Patient > 75%   Problem: RH Toileting Goal: LTG Patient will perform toileting task (3/3 steps) with assistance level (OT) Description: LTG: Patient will perform toileting task (3/3 steps) with assistance level (OT)  Flowsheets (Taken 08/25/2023 1210) LTG: Pt will perform toileting task (3/3 steps) with assistance level: Minimal Assistance - Patient > 75%   Problem: RH Functional Use of Upper Extremity Goal: LTG Patient will use RT/LT upper extremity as a (OT) Description: LTG: Patient will use right/left upper extremity as a stabilizer/gross assist/diminished/nondominant/dominant level with assist, with/without cues during functional activity (OT) Flowsheets (Taken 08/25/2023 1210) LTG: Use of upper extremity in functional activities: RUE as gross assist level LTG: Pt will use upper extremity in functional activity with assistance level of: Supervision/Verbal cueing   Problem: RH Toilet Transfers Goal: LTG Patient will perform toilet transfers w/assist (OT) Description: LTG: Patient will perform toilet transfers with assist, with/without cues using equipment (OT) Flowsheets (Taken 08/25/2023 1210) LTG: Pt will perform toilet transfers with assistance level of: Minimal Assistance - Patient > 75%   Problem: RH Tub/Shower Transfers Goal: LTG Patient will perform tub/shower transfers w/assist (OT) Description: LTG: Patient will perform tub/shower transfers with assist, with/without cues using equipment (OT) Flowsheets (Taken 08/25/2023 1210) LTG: Pt will perform tub/shower stall transfers with assistance level of: Minimal Assistance - Patient > 75%    Problem: RH Memory Goal: LTG Patient will demonstrate ability for day to day recall/carry over during activities of daily living with assistance level (OT) Description: LTG:  Patient will demonstrate ability for  day to day recall/carry over during activities of daily living with assistance level (OT). Flowsheets (Taken 08/25/2023 1210) LTG:  Patient will demonstrate ability for day to day recall/carry over during activities of daily living with assistance level (OT): Independent

## 2023-08-25 NOTE — Evaluation (Signed)
 Physical Therapy Assessment and Plan  Patient Details  Name: Erik Santana. 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   Past Surgical History:  Past Surgical History:  Procedure Laterality Date   ESOPHAGOGASTRODUODENOSCOPY N/A 10/18/2012   Procedure: ESOPHAGOGASTRODUODENOSCOPY (EGD);  Surgeon: Louis Meckel, MD;  Location: [facility];  Service: Endoscopy;  Laterality: N/A;   ESOPHAGOGASTRODUODENOSCOPY (EGD) WITH PROPOFOL N/A 10/18/2012   Procedure: ESOPHAGOGASTRODUODENOSCOPY (EGD) WITH PROPOFOL;  Surgeon: Louis Meckel, MD;  Location: [facility];  Service: Endoscopy;  Laterality: N/A;   FINGER SURGERY     OPEN REDUCTION INTERNAL FIXATION (ORIF) PROXIMAL PHALANX Right 10/12/2022   Procedure: Right ring finger middle phalanx and proximal interphalangeal joint closed reduction internal percutaneous pinning;  Surgeon: Gomez Cleverly, MD;  Location: [facility];  Service: Orthopedics;  Laterality: Right;    Assessment & Plan Clinical Impression: Patient is a 33 year old right-handed male with history of anxiety, ADD, Mallory-Weiss tear 2014/GERD as well as tobacco use as well as marijuana. Patient does endorse using Ativan and Percocet off the streets. Per chart review patient independent prior to admission working in [employer]. 1 level home with a ramped entrance. He does help provide care for his grandfather. Mother and family plans to assist as needed on discharge.  Presented 08/21/2023 with acute onset of left-sided weakness as well as dizziness and headache. He was able to call EMS himself. EMS found him on the floor. Blood pressure 130/81. CT of the head showed a 3.9 x 2.4 x 4.1 cm parenchymal hemorrhage centered at the right frontal parietal junction. No evidence of intraventricular extension no mass effect. CT cervical spine negative. CTA showed no intracranial large vessel occlusion or significant stenosis. Echocardiogram with ejection fraction of 55 to 60% no wall motion abnormalities. Admission chemistries unremarkable except glucose 157, urine drug screen positive marijuana. MRI follow-up showed unchanged appearance of intraparenchymal hematoma within the superior right frontal lobe. No mass lesion. Arteriogram completed per interventional radiology showing no gross abnormalities. Neurology continues to follow with conservative care. Lovenox was initiated for DVT prophylaxis 08/23/2023. Close monitoring of blood pressure. He has not required Cleviprex. [name] initiated for tobacco use.   Patient transferred to CIR on 08/24/2023 .   Patient currently requires max with mobility secondary to muscle weakness, decreased cardiorespiratoy endurance, decreased coordination, decreased attention to left, and decreased sitting balance, decreased standing balance, decreased postural control, hemiplegia, and decreased balance strategies.  Prior to hospitalization, patient was independent  with mobility and lived with Spouse in a House home.  Home access is  Ramped entrance.  Patient will benefit from skilled PT intervention to maximize safe functional mobility, minimize fall risk, and decrease caregiver burden for planned discharge home with 24 hour assist.  Anticipate patient will benefit from follow up OP at discharge.  PT - End of Session Endurance Deficit: Yes Endurance Deficit Description: with postional changes pt with dizzness and reports feeling drunk   PT  Evaluation Precautions/Restrictions Precautions Precautions: Fall Precaution/Restrictions Comments: left LE tone  Restrictions Weight Bearing Restrictions Per Provider Order: No General Chart Reviewed: Yes Family/Caregiver Present: Yes  Pain Interference   Home Living/Prior Functioning Home Living Available Help at Discharge: Family Type of Home: House Home Access: Ramped entrance Home Layout: One level Bathroom Toilet: Standard Bathroom Accessibility: Yes Additional Comments: Grandfather, helps care for grandfather, working in [employer]  Lives With: Spouse Vision/Perception  Vision - History Ability to See in Adequate Light: 0 Adequate Vision - Assessment Additional Comments: none noted on eval ; pt with dizziness with transitional movements Perception Perception: Impaired Preception Impairment Details: Inattention/Neglect Perception-Other Comments: slight inattention to left body Praxis Praxis: Impaired Praxis Impairment Details: Motor planning  Cognition Overall Cognitive Status: Within Functional Limits for tasks assessed Arousal/Alertness: Awake/alert Attention: Sustained;Selective Sustained Attention: Appears intact Selective Attention: Appears intact Memory: Impaired Memory Impairment: Decreased recall of new information Awareness: Impaired Awareness Impairment: Anticipatory impairment Problem Solving: Impaired Problem Solving Impairment: Functional basic Safety/Judgment: Impaired Comments: not aware of body changing positions that might result in unsafe position [employer] Scales of Cognitive Functioning: Purposeful, Appropriate: Stand-by Assistance on Request Sensation Sensation Light Touch: Impaired Detail Light Touch Impaired Details: Impaired LLE;Impaired LUE Proprioception: Impaired Detail Proprioception Impaired Details: Impaired LUE;Impaired LLE Coordination Gross Motor Movements are Fluid and Coordinated: No Fine Motor Movements are  Fluid and Coordinated: No Finger Nose Finger Test: flexed position on right LE Motor  Motor Motor: Abnormal tone;Hemiplegia;Primitive reflexes present;Abnormal postural alignment and control Motor - Skilled Clinical Observations: Clonus in left leg also present  Trunk/Postural Assessment  Cervical Assessment Cervical Assessment: Within Functional Limits Thoracic Assessment Thoracic Assessment:  (left scapular elevated) Lumbar Assessment Lumbar Assessment:  (posterior pelvic tile) Postural Control Postural Control: Deficits on evaluation Head Control: at midline Trunk Control: leans to the left without UE support - able to self correct Righting Reactions: delayed Protective Responses: delayed  Balance Balance Balance Assessed: Yes Static Sitting Balance Static Sitting - Balance Support: No upper extremity supported Static Sitting - Level of Assistance: 4: Min assist Dynamic Sitting Balance Dynamic Sitting - Balance Support: During functional activity Dynamic Sitting - Level of Assistance: 3: Mod assist Static Standing Balance Static Standing - Balance Support: During functional activity Static Standing - Level of Assistance: 2: Max assist Extremity Assessment  RUE Assessment RUE Assessment: Within Functional Limits LUE Assessment LUE Assessment: Exceptions to [facility] LUE Body System: Neuro Brunstrum levels for arm and hand: Arm;Hand Brunstrum level for arm: Stage II Synergy is developing Brunstrum level for hand: Stage II Synergy is developing LUE Tone LUE Tone: Severe;Modified Ashworth Body Part - Modified Ashworth Scale: Elbow;Wrist;Fingers;Thumb Elbow - Modified Ashworth Scale for Grading Hypertonia LUE: Considerable increase in muschle tone, passive movement difficult Wrist - Modified Ashworth Scale for Grading Hypertonia LUE: Considerable increase in muschle tone, passive movement difficult Fingers - Modified Ashworth Scale for Grading Hypertonia LUE: Considerable  increase in muschle tone, passive movement difficult Thumb - Modified Ashworth Scale for Grading Hypertonia LUE: Considerable increase in muschle tone, passive movement difficult Modified Ashworth Scale for Grading Hypertonia LUE: Considerable increase in muschle tone, passive movement difficult RLE Assessment RLE Assessment: Within Functional Limits LLE Assessment LLE Assessment: Exceptions to [facility] General Strength Comments: Unable to assess due to high tone  Care Tool Care Tool Bed Mobility Roll left and right activity   Roll left and right assist level: Moderate Assistance - Patient 50 - 74%    Sit to lying activity   Sit to lying assist level: Maximal Assistance - Patient 25 - 49%  Lying to sitting on side of bed activity   Lying to sitting on side of bed assist level: the ability to move from lying on the back to sitting on the side of the bed with no back support.: Maximal Assistance - Patient 25 - 49%     Care Tool Transfers Sit to stand transfer   Sit to stand assist level: Maximal Assistance - Patient 25 - 49%    Chair/bed transfer   Chair/bed transfer assist level: Maximal Assistance - Patient 25 - 49%    Car transfer Car transfer activity did not occur:  (per clinical judgment) Car transfer assist level: Maximal Assistance - Patient 25 - 49%      Care Tool Locomotion Ambulation   Assist level: 2 helpers Assistive device:  (Rt hand rail) Max distance: 5'  Walk 10 feet activity Walk 10 feet activity did not occur: Safety/medical concerns (Fatigue)       Walk 50 feet with 2 turns activity Walk 50 feet with 2 turns activity did not occur: Safety/medical concerns      Walk 150 feet activity Walk 150 feet activity did not occur: Safety/medical concerns      Walk 10 feet on uneven surfaces activity Walk 10 feet on uneven surfaces activity did not occur: Safety/medical concerns      Stairs   Assist level: Maximal Assistance - Patient 25 - 49% Stairs assistive  device: 1 hand rail Max number of stairs: 2  Walk up/down 1 step activity   Walk up/down 1 step (curb) assist level: Maximal Assistance - Patient 25 - 49% Walk up/down 1 step or curb assistive device: 1 hand rail  Walk up/down 4 steps activity Walk up/down 4 steps activity did not occur: Safety/medical concerns      Walk up/down 12 steps activity Walk up/down 12 steps activity did not occur: Safety/medical concerns      Pick up small objects from floor   Pick up small object from the floor assist level: Dependent - Patient 0%    Wheelchair Is the patient using a wheelchair?: Yes Type of Wheelchair: Manual   Wheelchair assist level: Dependent - Patient 0% Max wheelchair distance: 150'  Wheel 50 feet with 2 turns activity   Assist Level: Dependent - Patient 0%  Wheel 150 feet activity   Assist Level: Dependent - Patient 0%    Refer to Care Plan for Long Term Goals  SHORT TERM GOAL WEEK 1    Recommendations for other services: Neuropsych  Skilled Therapeutic Intervention  Evaluation completed (see details above and below) with education on PT POC and goals and individual treatment initiated with focus on transfers, ambulation, and stair training. Pt received seated in WC and agrees to therapy. Reports pain and stiffness in left leg. LLE noted to have very high extensor tone. PT provides WB through LLE to improve tone and pain, as well as providing rest breaks as needed. OT present for much of session as skilled +2 due to pt's high mobility needs. WC transport to gym. PT provides manual facilitation of Lt knee flexion to counteract tone, with pressure provided at quads and hamstrings for improved comfort. Pt performs sit to stand from [facility] with Rt handrail with modA and cues for sequencing, positioning, and body mechanics. Additional cues provided to promote hip flexion when transitioning from standing to sitting to decrease tone in LLE. Following rest break, pt ambulates x5' and x15'  with ~modA +2, with PT providing max manual assistance to promote flexion  in Lt knee and hip to assist with progression of LLE during gait cycle. Manual cueing also provided at Lt popliteal fossa to limit hyperextension in stance phase. PT provides education on importance WB through Lt hemibody for NMR and to prevent compensatory motor patterns. Pt performs x2 6" steps with Rt Hand rail and maxA, with cues for safe sequencing and positioning, as well as PT managing progression of LLE. Pt provided with tilt in space WC and left seated with alarm intact and all needs within reach.   Mobility Bed Mobility Bed Mobility: Supine to Sit Supine to Sit: Maximal Assistance - Patient - Patient 25-49% Transfers Transfers: Sit to Stand;Stand to Sit;Stand Pivot Transfers;Squat Pivot Transfers Sit to Stand: Maximal Assistance - Patient 25-49% Stand to Sit: Maximal Assistance - Patient 25-49% Stand Pivot Transfers: 2 Helpers Squat Pivot Transfers: Maximal Assistance - Patient 25-49% Locomotion  Gait Ambulation: Yes Gait Assistance: 2 Helpers Gait Distance (Feet): 15 Feet Assistive device:  (Rt handrail) Gait Assistance Details: Verbal cues for sequencing;Verbal cues for technique;Verbal cues for precautions/safety;Manual facilitation for weight shifting;Manual facilitation for weight bearing;Manual facilitation for placement;Verbal cues for gait pattern Gait Gait: Yes Gait Pattern: Impaired (extremely high extensor tone in LLE) Stairs / Additional Locomotion Stairs: Yes Stairs Assistance: Maximal Assistance - Patient 25 - 49% Stair Management Technique: One rail Right Number of Stairs: 2 Height of Stairs: 6 Wheelchair Mobility Wheelchair Mobility: Yes Wheelchair Assistance: Dependent - Patient 0% Wheelchair Parts Management: Needs assistance Distance: 150'   Discharge Criteria: Patient will be discharged from PT if patient refuses treatment 3 consecutive times without medical reason, if treatment  goals not met, if there is a change in medical status, if patient makes no progress towards goals or if patient is discharged from hospital.  The above assessment, treatment plan, treatment alternatives and goals were discussed and mutually agreed upon: by patient and by family  [name], PT, DPT 08/25/2023, 12:12 PM

## 2023-08-25 NOTE — Plan of Care (Signed)
  Problem: RH Problem Solving Goal: LTG Patient will demonstrate problem solving for (SLP) Description: LTG:  Patient will demonstrate problem solving for basic/complex daily situations with cues  (SLP) Flowsheets (Taken 08/25/2023 1655) LTG: Patient will demonstrate problem solving for (SLP):  Basic daily situations  Complex daily situations LTG Patient will demonstrate problem solving for: Modified Independent   Problem: RH Memory Goal: LTG Patient will demonstrate ability for day to day (SLP) Description: LTG:   Patient will demonstrate ability for day to day recall/carryover during cognitive/linguistic activities with assist  (SLP) Flowsheets (Taken 08/25/2023 1655) LTG: Patient will demonstrate ability for day to day recall:  New information  Daily complex information LTG: Patient will demonstrate ability for day to day recall/carryover during cognitive/linguistic activities with assist (SLP): Modified Independent   Problem: RH Pre-functional/Other (Specify) Goal: RH LTG SLP (Specify) 1 Description: RH LTG SLP (Specify) 1 Flowsheets (Taken 08/25/2023 1655) LTG: Other SLP (Specify) 1: Pt will adequately process mildly complex information during cognitive tasks @ modI

## 2023-08-25 NOTE — Evaluation (Signed)
 Speech Language Pathology Assessment and Plan  Patient Details  Name: Erik Santana. 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   Past Surgical History:  Past Surgical History:  Procedure Laterality Date   ESOPHAGOGASTRODUODENOSCOPY N/A 10/18/2012   Procedure: ESOPHAGOGASTRODUODENOSCOPY (EGD);  Surgeon: Louis Meckel, MD;  Location: [facility];  Service: Endoscopy;  Laterality: N/A;   ESOPHAGOGASTRODUODENOSCOPY (EGD) WITH PROPOFOL N/A 10/18/2012   Procedure: ESOPHAGOGASTRODUODENOSCOPY (EGD) WITH PROPOFOL;  Surgeon: Louis Meckel, MD;  Location: [facility];  Service: Endoscopy;  Laterality: N/A;   FINGER SURGERY     OPEN REDUCTION INTERNAL FIXATION (ORIF) PROXIMAL PHALANX Right 10/12/2022   Procedure: Right ring finger middle phalanx and proximal interphalangeal joint closed reduction internal percutaneous pinning;  Surgeon: Gomez Cleverly, MD;  Location: [facility];  Service: Orthopedics;  Laterality: Right;    Assessment / Plan / Recommendation Clinical Impression Pt is a 33 year old right-handed male with history of anxiety, ADD, Mallory-Weiss tear 2014/GERD as well as tobacco use as well as marijuana. Patient does endorse using Ativan and Percocet off the streets. Presented 08/21/2023 with acute onset of left-sided weakness as well as dizziness and headache. He was able to call EMS himself. EMS found him on the  floor. Blood pressure 130/81. CT of the head showed a 3.9 x 2.4 x 4.1 cm parenchymal hemorrhage centered at the right frontal parietal junction. No evidence of intraventricular extension no mass effect. CT cervical spine negative. CTA showed no intracranial large vessel occlusion or significant stenosis. Echocardiogram with ejection fraction of 55 to 60% no wall motion abnormalities. Admission chemistries unremarkable except glucose 157, urine drug screen positive marijuana. MRI follow-up showed unchanged appearance of intraparenchymal hematoma within the superior right frontal lobe. No mass lesion. Arteriogram completed per interventional radiology showing no gross abnormalities. Neurology continues to follow with conservative care. Lovenox was initiated for DVT prophylaxis 08/23/2023. Close monitoring of blood pressure. He has not required Cleviprex. [name] initiated for tobacco use. Therapy evaluations completed due to patient's decreased functional mobility was admitted for a comprehensive rehab program.   Eval tasks initially delayed d/t nursing care, as pt required assistance to the bedside commode for BM. Once BM was complete, SLP assisted CNA with stand pivot transfer back to wheelchair. SLP assisted as 2nd person for safety to block pt's L side given dizziness. During the transfer, pt reported potential rolling of his ankle and he presented w/ only mild problem solving and reasoning deficits during conversational task ID potential solutions for discomfort. Once comfortable in his chair, he completed portions of the Cognistat. Only mild deficits in the areas of STM, information processing, and complex problem solving were noted. Do anticipate that generalized anxiety negatively impacted success slightly, however, pt would benefit from continued ST services at this time to target cognitive deficits, maximize pt independence, and facilitate return to prev roles/responsibilities.   The pt and his  sister reported no concern re dysphagia at this time. Per  EMR, he continues to tolerate regular diet/thin liquids w/o difficulty. Dysphagia eval/intervention not warranted.     Skilled Therapeutic Interventions          SLP facilitated a cognitive-linguistic evaluation to assess pt's cognitive-communication skills and determine need for additional skilled ST services. See above for more information.    SLP Assessment  Patient will need skilled Speech Lanaguage Pathology Services during CIR admission    Recommendations  SLP Diet Recommendations: Thin;Age appropriate regular solids Liquid Administration via: Straw Medication Administration: Whole meds with liquid Supervision: Patient able to self feed Compensations: Slow rate;Small sips/bites Postural Changes and/or Swallow Maneuvers: Seated upright 90 degrees;Upright 30-60 min after meal Oral Care Recommendations: Oral care BID Recommendations for Other Services: Therapeutic Recreation consult;Neuropsych consult Therapeutic Recreation Interventions: Pet therapy Patient destination: Home Follow up Recommendations: Other (comment) (don't anticipate continued ST needs by time of discharge)    SLP Frequency 1 to 3 out of 7 days   SLP Duration  SLP Intensity  SLP Treatment/Interventions 4 weeks  Minumum of 1-2 x/day, 30 to 90 minutes  Cognitive remediation/compensation;Internal/external aids;Environmental controls;Therapeutic Activities;Patient/family education;Functional tasks;Cueing hierarchy    Pain    Prior Functioning Cognitive/Linguistic Baseline: Within functional limits Baseline deficit details: overall WFL, though ADHD noted at baseline Type of Home: House  Lives With: Family Available Help at Discharge: Family Vocation: Full time employment  SLP Evaluation Cognition Overall Cognitive Status: Impaired/Different from baseline Arousal/Alertness: Awake/alert Orientation Level: Oriented X4 Year: 2025 Month:  February Day of Week: Correct Attention: Sustained;Selective Sustained Attention: Appears intact Selective Attention: Impaired Selective Attention Impairment: Verbal complex;Functional complex Memory: Impaired Memory Impairment: Decreased recall of new information Awareness: Impaired Awareness Impairment: Anticipatory impairment Problem Solving: Impaired Problem Solving Impairment: Functional complex;Verbal complex Executive Function: Reasoning;Self Monitoring;Self Correcting Reasoning: Impaired Self Monitoring: Impaired Self Correcting: Impaired [place] [employer] Scales of Cognitive Functioning: Purposeful, Appropriate: Stand-by Assistance on Request  Comprehension Auditory Comprehension Overall Auditory Comprehension: Appears within functional limits for tasks assessed Expression Expression Primary Mode of Expression: Verbal Verbal Expression Overall Verbal Expression: Appears within functional limits for tasks assessed Oral Motor Oral Motor/Sensory Function Overall Oral Motor/Sensory Function: Within functional limits Motor Speech Overall Motor Speech: Appears within functional limits for tasks assessed  Care Tool Care Tool Cognition Ability to hear (with hearing aid or hearing appliances if normally used Ability to hear (with hearing aid or hearing appliances if normally used): 0. Adequate - no difficulty in normal conservation, social interaction, listening to TV   Expression of Ideas and Wants Expression of Ideas and Wants: 4. Without difficulty (complex and basic) - expresses complex messages without difficulty and with speech that is clear and easy to understand   Understanding Verbal and Non-Verbal Content Understanding Verbal and Non-Verbal Content: 4. Understands (complex and basic) - clear comprehension without cues or repetitions  Memory/Recall Ability Memory/Recall Ability : Current season;That he or she is in a hospital/hospital unit   Motor Speech Assessment   WFL    Short Term Goals: Week 1: SLP Short Term Goal 1 (Week 1): Pt will recall recent/relevant info with 90% accuracy and s cues SLP Short Term Goal 2 (Week 1): Pt will adequately process mildly complex information during cognitive tasks w/ s cues SLP Short Term Goal 3 (Week 1): Pt will complete mildly complex to complex problem solving tasks with s cues  Refer to Care Plan for Long Term Goals  Recommendations for other services: Neuropsych and Therapeutic Recreation  Pet therapy  Discharge Criteria: Patient will be discharged  from SLP if patient refuses treatment 3 consecutive times without medical reason, if treatment goals not met, if there is a change in medical status, if patient makes no progress towards goals or if patient is discharged from hospital.  The above assessment, treatment plan, treatment alternatives and goals were discussed and mutually agreed upon: by patient and by family  Pati Gallo 08/25/2023, 4:50 PM

## 2023-08-25 NOTE — Progress Notes (Addendum)
 PROGRESS NOTE   Subjective/Complaints:  No events overnight.  Patient endorses poor sleep secondary to anxiety.  States he medicates with Xanax at home, used to have a prescription but has not had a PCP to prescribe it in some time.   Also complaining of dizziness, described as feeling "drunk" and the room spinning, especially when looking to the left.  He states this is similar to how he felt just before falling, denies any history of BPPV, symptoms are very limiting.  Having significant spasticity in his left upper and lower extremity, unable to stretch his left hand without significant discomfort.  Manage heart rate 44 this a.m.; otherwise normal rate, other vital stable Multiple liquid bowel movements yesterday, last bowel movement medium this a.m.  ROS: Denies fevers, chills, N/V, abdominal pain, constipation, diarrhea, SOB, cough, chest pain, new weakness or paraesthesias.   + insomnia + vertigo + LUE and LLE spasticity  Objective:   No results found. Recent Labs    08/24/23 0605 08/25/23 0603  WBC 8.9 8.0  HGB 16.0 16.7  HCT 45.0 46.8  PLT 317 333   Recent Labs    08/24/23 0605 08/25/23 0603  NA 136 138  K 3.2* 3.4*  CL 104 102  CO2 22 24  GLUCOSE 103* 100*  BUN 9 8  CREATININE 0.87 0.92  CALCIUM 9.2 9.5    Intake/Output Summary (Last 24 hours) at 08/25/2023 0947 Last data filed at 08/24/2023 1800 Gross per 24 hour  Intake 240 ml  Output 500 ml  Net -260 ml        Physical Exam: Vital Signs Blood pressure 127/86, pulse (!) 44, temperature 98.4 F (36.9 C), resp. rate 17, height 6\' 2"  (1.88 m), weight 76.8 kg, SpO2 99%. Constitutional: No apparent distress. Appropriate appearance for age.  Sitting up in bedside wheelchair. HENT: No JVD. Neck Supple. Trachea midline. Atraumatic, normocephalic. Eyes: PERRLA. EOMI. Visual fields grossly intact.  + Vertiginous symptoms with leftward gaze, horizontal  and vertical saccades, not associated with headache or vision deficits.  Mild horizontal nystagmus.  Cardiovascular: RRR, no murmurs/rub/gallops. No Edema. Peripheral pulses 2+  Respiratory: CTAB. No rales, rhonchi, or wheezing. On RA.  Abdomen: + bowel sounds, normoactive. No distention or tenderness.  Skin: C/D/I. No apparent lesions.  Peripheral IV intact.   MSK:      No apparent deformity.  Some apparent atrophy of left wrist extensor compartment.  Starting to develop slight contractures of the left DIP finger flexors.      Neurologic exam:  Cognition: AAO to person, place, time and event.  Language: Fluent, No substitutions or neoglisms. No dysarthria. Names 3/3 objects correctly.  Memory: No apparent deficits Insight: Fair insight into current condition.  Mood: Anxious mood, elevated but appropriate. Sensation: Equal and intact in BL UE and Les.  Reflexes: 2+ in R  UE and LE; left lower extremity clonus with mobilization, unable to bring out reflexes due to tone.. Negative Hoffman's and babinski signs bilaterally.  CN: 2-12 grossly intact.  Coordination: No apparent tremors. No ataxia on FTN, HTS on R Spasticity:  Left upper extremity: MAS 2 shoulder abductors, elbow flexors, elbow extensors; MAS 3 wrist flexors; MAS  4 finger flexors, MAS 2 thumb adductors.  Left lower extremity: MAS 2 hip flexors, MAS 3 knee extensors, MAS 2 knee flexors, MAS 2 plantar flexors       Strength:                LUE: 0/5 SA, 0/5 EF, 2-/5 EE, 0/5 WE, 1/5 FF, 0/5 FA                RUE:  5/5 SA, 5/5 EF, 5/5 EE, 5/5 WE, 5/5 FF, 5/5 FA                LLE: 3/5 HF, 3/5 KE, 0/5  DF, 0/5  EHL, 1+/5  PF                 RLE:  5/5 HF, 5/5 KE, 5/5  DF, 5/5  EHL, 5/5  PF       Assessment/Plan: 1. Functional deficits which require 3+ hours per day of interdisciplinary therapy in a comprehensive inpatient rehab setting. Physiatrist is providing close team supervision and 24 hour management of active medical  problems listed below. Physiatrist and rehab team continue to assess barriers to discharge/monitor patient progress toward functional and medical goals  Care Tool:  Bathing              Bathing assist       Upper Body Dressing/Undressing Upper body dressing        Upper body assist      Lower Body Dressing/Undressing Lower body dressing            Lower body assist       Toileting Toileting    Toileting assist Assist for toileting: Moderate Assistance - Patient 50 - 74%     Transfers Chair/bed transfer  Transfers assist     Chair/bed transfer assist level: Total Assistance - Patient < 25%     Locomotion Ambulation   Ambulation assist              Walk 10 feet activity   Assist           Walk 50 feet activity   Assist           Walk 150 feet activity   Assist           Walk 10 feet on uneven surface  activity   Assist           Wheelchair     Assist               Wheelchair 50 feet with 2 turns activity    Assist            Wheelchair 150 feet activity     Assist          Blood pressure 127/86, pulse (!) 44, temperature 98.4 F (36.9 C), resp. rate 17, height 6\' 2"  (1.88 m), weight 76.8 kg, SpO2 99%.   Medical Problem List and Plan: 1. 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  Antithrombotics: -DVT/anticoagulation:  Pharmaceutical: Lovenox             -antiplatelet  therapy: N/A   3. Pain Management:    - 2/27: Tylenol 350 to 650 mg every 6 hours for mild to moderate pain, tramadol 50 mg every 6 hours as needed for severe pain.  Has minimal complaints of pain so far, would consider gabapentin if additional needed.  4. Mood/Behavior/Sleep: Provide emotional  support             -antipsychotic agents: N/A  -2-27: Was using Xanax at home for sleep.  Has Atarax on board, add as needed melatonin and scheduled trazodone 100 mg nightly.  Will get sleep log.   5. Neuropsych/cognition: This patient is capable of making decisions on his own behalf.   6. Skin/Wound Care: Routine skin checks   7. Fluids/Electrolytes/Nutrition: Routine in and outs with follow-up chemistries   -Potassium slightly low 3.4, otherwise labs stable.  8.  Tobacco use.  [name]- consider discussion of decrease in dose given elevated blood pressure.  Provide counseling   9.  History of polysubstance use.  Urine drug screen positive marijuana.  Patient does endorse using Ativan and Percocet off the streets.  Provide counseling   10.  Constipation/diarrhea.  Continue Senokot S1 tablet twice daily.   -Multiple liquid stools overnight, large; DC Senokot  11.  Spasticity.  -Ordered left WHO today, may need custom per OT  -Increase baclofen from 10 mg twice daily to 10 mg 3 times daily; add Zanaflex 2 mg every 8 hours as needed for spasms  -Monitor with therapy; would consider addition of antiepileptic if significant myoclonus  -Discussed with patient this a.m., may need Botox exam and injection while in inpatient rehab for left finger flexor tightness, will monitor 1 week on medication prior to pursuing  LOS: 1 days A FACE TO FACE EVALUATION WAS PERFORMED  Angelina Sheriff 08/25/2023, 9:47 AM

## 2023-08-25 NOTE — Evaluation (Addendum)
 Occupational Therapy Assessment and Plan  Patient Details  Name: Erik Santana. MRN: [id number] Date of Birth: Jul 27, 1990  OT Diagnosis: abnormal posture, cognitive deficits, hemiplegia affecting non-dominant side, and muscle weakness (generalized) Rehab Potential: Rehab Potential (ACUTE ONLY): Good ELOS: ~ 4 weeks   Today's Date: 08/25/2023 OT Individual Time: 3244-0102 OT Individual Time Calculation (min): 60 min     Hospital Problem: Principal Problem:   Intraparenchymal hematoma of brain [facility])   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   Past Surgical History:  Past Surgical History:  Procedure Laterality Date   ESOPHAGOGASTRODUODENOSCOPY N/A 10/18/2012   Procedure: ESOPHAGOGASTRODUODENOSCOPY (EGD);  Surgeon: Louis Meckel, MD;  Location: [facility];  Service: Endoscopy;  Laterality: N/A;   ESOPHAGOGASTRODUODENOSCOPY (EGD) WITH PROPOFOL N/A 10/18/2012   Procedure: ESOPHAGOGASTRODUODENOSCOPY (EGD) WITH PROPOFOL;  Surgeon: Louis Meckel, MD;  Location: [facility];  Service: Endoscopy;  Laterality: N/A;   FINGER SURGERY     OPEN REDUCTION INTERNAL FIXATION (ORIF) PROXIMAL PHALANX Right 10/12/2022   Procedure: Right ring finger middle phalanx and proximal interphalangeal joint closed reduction internal percutaneous pinning;  Surgeon: Gomez Cleverly, MD;  Location: [facility];  Service: Orthopedics;  Laterality: Right;    Assessment & Plan Clinical Impression: Patient is a 33 y.o. year old male ight-handed male with history of anxiety, ADD, Mallory-Weiss tear 2014/GERD as well as tobacco use as well as marijuana. Patient does endorse using Ativan and Percocet off the streets. Per chart review patient independent prior to admission working in [employer]. 1 level home with a ramped entrance. He does help  provide care for his grandfather. Mother and family plans to assist as needed on discharge. Presented 08/21/2023 with acute onset of left-sided weakness as well as dizziness and headache. He was able to call EMS himself. EMS found him on the floor. Blood pressure 130/81. CT of the head showed a 3.9 x 2.4 x 4.1 cm parenchymal hemorrhage centered at the right frontal parietal junction. No evidence of intraventricular extension no mass effect. CT cervical spine negative. CTA showed no intracranial large vessel occlusion or significant stenosis. Echocardiogram with ejection fraction of 55 to 60% no wall motion abnormalities. Admission chemistries unremarkable except glucose 157, urine drug screen positive marijuana. MRI follow-up showed unchanged appearance of intraparenchymal hematoma within the superior right frontal lobe. No mass lesion. Arteriogram completed per interventional radiology showing no gross abnormalities. Neurology continues to follow with conservative care. Lovenox was initiated for DVT prophylaxis 08/23/2023. Close monitoring of blood pressure. He has not required Cleviprex. [name] initiated for tobacco use. Therapy evaluations completed due to patient's decreased functional mobility was admitted for a comprehensive rehab program.   Patient transferred to CIR on 08/24/2023 .    Patient currently requires  total A to total A +2  with basic self-care skills and basic mobility  secondary to muscle weakness and muscle joint tightness, decreased cardiorespiratoy endurance, impaired timing and sequencing, abnormal tone, unbalanced muscle activation, decreased coordination, and decreased motor planning, decreased attention to left and decreased motor planning, delayed processing and demonstrates behaviors consistent with [place], and decreased sitting balance, decreased standing balance, decreased postural control, hemiplegia, and decreased balance strategies.  Prior to hospitalization,  patient could complete ADL with independent .  Patient will benefit from skilled intervention to decrease level of assist  with basic self-care skills and increase independence with basic self-care skills prior to discharge home with care partner.  Anticipate patient will require minimal physical assistance and follow up outpatient.  OT - End of Session Activity Tolerance: Tolerates 30+ min activity with multiple rests Endurance Deficit: Yes Endurance Deficit Description: with postional changes pt with dizzness and reports feeling drunk OT Assessment Rehab Potential (ACUTE ONLY): Good OT Patient demonstrates impairments in the following area(s): Balance;Sensory;Endurance;Motor;Pain;Perception;Safety;Behavior OT Basic ADL's Functional Problem(s): Grooming;Bathing;Dressing;Toileting OT Transfers Functional Problem(s): Toilet;Tub/Shower OT Additional Impairment(s): Fuctional Use of Upper Extremity OT Plan OT Intensity: Minimum of 1-2 x/day, 45 to 90 minutes OT Frequency: 5 out of 7 days OT Duration/Estimated Length of Stay: ~ 4 weeks OT Treatment/Interventions: Balance/vestibular training;Disease mangement/prevention;Neuromuscular re-education;Self Care/advanced ADL retraining;Therapeutic Exercise;Wheelchair propulsion/positioning;Cognitive remediation/compensation;DME/adaptive equipment instruction;Pain management;Skin care/wound managment;UE/LE Strength taining/ROM;Community reintegration;Functional electrical stimulation;Patient/family education;Splinting/orthotics;UE/LE Coordination activities;Discharge planning;Functional mobility training;Psychosocial support;Therapeutic Activities;Visual/perceptual remediation/compensation OT Self Feeding Anticipated Outcome(s): n/a OT Basic Self-Care Anticipated Outcome(s): min A OT Toileting Anticipated Outcome(s): min A OT Bathroom Transfers Anticipated Outcome(s): min  A OT Recommendation Recommendations for Other Services: Neuropsych consult Patient  destination: Home Follow Up Recommendations: Outpatient OT Equipment Recommended: To be determined   OT Evaluation Precautions/Restrictions  Precautions Precautions: Fall Precaution/Restrictions Comments: left LE tone Restrictions Weight Bearing Restrictions Per Provider Order: No General Chart Reviewed: Yes Family/Caregiver Present: No Vital Signs   Pain  Ongoing dizziness and feeling drunk Home Living/Prior Functioning Home Living Available Help at Discharge: Family Type of Home: House Home Access: Ramped entrance Home Layout: One level Firefighter: Standard Bathroom Accessibility: Yes Additional Comments: Grandfather, helps care for grandfather, working in [employer]  Lives With: Spouse Vision Baseline Vision/History: 0 No visual deficits Ability to See in Adequate Light: 0 Adequate Patient Visual Report: No change from baseline Vision Assessment?: No apparent visual deficits Additional Comments: none noted on eval ; pt with dizziness with transitional movements Perception  Perception: Impaired Perception-Other Comments: slight inattention to left body Praxis Praxis: Impaired Praxis Impairment Details: Motor planning Cognition Cognition Overall Cognitive Status: Within Functional Limits for tasks assessed Arousal/Alertness: Awake/alert Orientation Level: Person;Place;Situation Person: Oriented Place: Oriented Situation: Oriented Memory: Impaired Memory Impairment: Decreased recall of new information Attention: Sustained;Selective Sustained Attention: Appears intact Selective Attention: Appears intact Awareness: Impaired Awareness Impairment: Anticipatory impairment Problem Solving: Impaired Problem Solving Impairment: Functional basic Safety/Judgment: Impaired Comments: not aware of body changing positions that might result in unsafe position [employer] Scales of Cognitive Functioning: Purposeful, Appropriate: Stand-by Assistance on  Request Brief Interview for Mental Status (BIMS) Repetition of Three Words (First Attempt): 3 Temporal Orientation: Year: Correct Temporal Orientation: Month: Accurate within 5 days Temporal Orientation: Day: Correct Recall: "Sock": Yes, no cue required Recall: "Blue": Yes, no cue required Recall: "Bed": Yes, no cue required BIMS Summary Score: 15 Sensation Sensation Light Touch: Impaired Detail Light Touch Impaired Details: Impaired LLE;Impaired LUE Proprioception: Impaired Detail Proprioception Impaired Details: Impaired LUE;Impaired LLE Coordination Gross Motor Movements are Fluid and Coordinated: No Fine Motor Movements are Fluid and Coordinated: No Finger Nose Finger Test: flexed position on right LE Motor  Motor Motor: Abnormal tone;Hemiplegia;Primitive reflexes present;Abnormal postural alignment and control Motor - Skilled Clinical Observations: Clonus in left leg also present  Trunk/Postural Assessment  Cervical Assessment Cervical Assessment: Within Functional Limits Thoracic Assessment Thoracic Assessment:  (left scapular elevated) Lumbar Assessment Lumbar Assessment:  (posterior pelvic tile) Postural Control Postural Control: Deficits on evaluation Head Control: at midline Trunk Control: leans to the left without UE support - able to  self correct Righting Reactions: delayed Protective Responses: delayed  Balance Balance Balance Assessed: Yes Static Sitting Balance Static Sitting - Balance Support: No upper extremity supported Static Sitting - Level of Assistance: 4: Min assist Dynamic Sitting Balance Dynamic Sitting - Balance Support: During functional activity Dynamic Sitting - Level of Assistance: 3: Mod assist Static Standing Balance Static Standing - Balance Support: During functional activity Static Standing - Level of Assistance: 2: Max assist Extremity/Trunk Assessment RUE Assessment RUE Assessment: Within Functional Limits LUE Assessment LUE  Assessment: Exceptions to [facility] LUE Body System: Neuro Brunstrum levels for arm and hand: Arm;Hand Brunstrum level for arm: Stage II Synergy is developing Brunstrum level for hand: Stage II Synergy is developing LUE Tone LUE Tone: Severe;Modified Ashworth Body Part - Modified Ashworth Scale: Elbow;Wrist;Fingers;Thumb Elbow - Modified Ashworth Scale for Grading Hypertonia LUE: Considerable increase in muschle tone, passive movement difficult Wrist - Modified Ashworth Scale for Grading Hypertonia LUE: Considerable increase in muschle tone, passive movement difficult Fingers - Modified Ashworth Scale for Grading Hypertonia LUE: Considerable increase in muschle tone, passive movement difficult Thumb - Modified Ashworth Scale for Grading Hypertonia LUE: Considerable increase in muschle tone, passive movement difficult Modified Ashworth Scale for Grading Hypertonia LUE: Considerable increase in muschle tone, passive movement difficult  Care Tool Care Tool Self Care Eating   Eating Assist Level: Set up assist    Oral Care    Oral Care Assist Level: Minimal Assistance - Patient > 75%    Bathing   Body parts bathed by patient: Left arm;Chest;Abdomen;Front perineal area;Right upper leg;Left upper leg;Face Body parts bathed by helper: Buttocks;Right lower leg;Left lower leg   Assist Level: Moderate Assistance - Patient 50 - 74%    Upper Body Dressing(including orthotics)   What is the patient wearing?: Pull over shirt   Assist Level: Moderate Assistance - Patient 50 - 74%    Lower Body Dressing (excluding footwear)   What is the patient wearing?: Pants Assist for lower body dressing: 2 Helpers    Putting on/Taking off footwear   What is the patient wearing?: Non-skid slipper socks;Ted hose Assist for footwear: Total Assistance - Patient < 25%       Care Tool Toileting Toileting activity   Assist for toileting: Maximal Assistance - Patient 25 - 49%     Care Tool Bed Mobility Roll  left and right activity        Sit to lying activity        Lying to sitting on side of bed activity         Care Tool Transfers Sit to stand transfer   Sit to stand assist level: Maximal Assistance - Patient 25 - 49%    Chair/bed transfer   Chair/bed transfer assist level: Maximal Assistance - Patient 25 - 49%     Toilet transfer   Assist Level: Maximal Assistance - Patient 24 - 49%     Care Tool Cognition  Expression of Ideas and Wants Expression of Ideas and Wants: 4. Without difficulty (complex and basic) - expresses complex messages without difficulty and with speech that is clear and easy to understand  Understanding Verbal and Non-Verbal Content Understanding Verbal and Non-Verbal Content: 4. Understands (complex and basic) - clear comprehension without cues or repetitions   Memory/Recall Ability     Refer to Care Plan for Long Term Goals  SHORT TERM GOAL WEEK 1 OT Short Term Goal 1 (Week 1): Pt will use left UE as gross stabilization in grooming task OT  Short Term Goal 2 (Week 1): Pt will be given instructions on self ROM to do in bed with left UE OT Short Term Goal 3 (Week 1): Pt will transfer with mod A to toilet  Recommendations for other services: Neuropsych   Skilled Therapeutic Intervention 1:1 OT eval initiated with OT purpose, role and goals discussed with pt and pt's sister. Pt reports not sleeping well last night and reports being anxious all last night and today. Pt hesitant  to getting out of bed but then with encouragement and RN bringing in meds pt agreeable to getting up. Pt with strong extension tone throughout left LE requiring total A to flex. Max- total A to come to EOB and required min A to maintain sitting balance on EOB. With max to total A to facilitate a safe transfer to the w/c with max A. Max A to transfer into shower. Pt required min A to maintain sitting balance/ falls to the left. Max A transfer back to w/c. Sit to stands with max A with  total A to facilitate and maintain proper placement of left LE; while RN pulled up pants.  Pt agreeable to try to sit in the w/c until next session. Pt does report being dizzy with any and all positional changes. SAfety belt in place.    ADL ADL Grooming: Minimal assistance Where Assessed-Grooming: Wheelchair Upper Body Bathing: Minimal assistance Where Assessed-Upper Body Bathing: Shower Lower Body Bathing: Maximal assistance Where Assessed-Lower Body Bathing: Shower Upper Body Dressing: Moderate assistance Where Assessed-Upper Body Dressing: Wheelchair;Sitting at sink Lower Body Dressing: Maximal assistance (+2 for safety) Psychologist, counselling Transfer: Maximal assistance Film/video editor Method: Administrator: Transfer tub bench Mobility  Bed Mobility Bed Mobility: Supine to Sit Supine to Sit: Maximal Assistance - Patient - Patient 25-49% Transfers Sit to Stand: Maximal Assistance - Patient 25-49% Stand to Sit: Maximal Assistance - Patient 25-49%   Discharge Criteria: Patient will be discharged from OT if patient refuses treatment 3 consecutive times without medical reason, if treatment goals not met, if there is a change in medical status, if patient makes no progress towards goals or if patient is discharged from hospital.  The above assessment, treatment plan, treatment alternatives and goals were discussed and mutually agreed upon: by patient  Adan Sis 08/25/2023, 11:57 AM

## 2023-08-26 ENCOUNTER — Telehealth: Payer: Self-pay | Admitting: Emergency Medicine

## 2023-08-26 NOTE — Progress Notes (Signed)
 Speech Language Pathology Daily Session Note  Patient Details  Name: Erik Santana. MRN: [id number] Date of Birth: 11-28-1990  Today's Date: 08/26/2023 SLP Individual Time: 1000-1100 SLP Individual Time Calculation (min): 60 min  Short Term Goals: Week 1: SLP Short Term Goal 1 (Week 1): Pt will recall recent/relevant info with 90% accuracy and s cues SLP Short Term Goal 2 (Week 1): Pt will adequately process mildly complex information during cognitive tasks w/ s cues SLP Short Term Goal 3 (Week 1): Pt will complete mildly complex to complex problem solving tasks with s cues  Skilled Therapeutic Interventions:   Pt and sister greeted at bedside. He was very pleasant/cooperative throughout tx tasks targeting cognition. SLP provided pt w/ 4 words for structured recall task. He immediately recalled 4/4 words. Despite a noisy environment, he completed an alternating attention task with only minA cues for attention to detail and information processing. After ~15 mins, he was able to recall 3/4 words independently. He then completed a verbal time calculation task, requiring only s cues for information processing. He required only s cues for verbal problem solving/reasoning during conversational task re vertigo and other medications. He was left in his bed with the alarm set and call light within reach. Recommend cont ST per POC.   Pain  No pain reported  Therapy/Group: Individual Therapy  Pati Gallo 08/26/2023, 10:52 AM

## 2023-08-26 NOTE — Plan of Care (Signed)
  Problem: RH BOWEL ELIMINATION Goal: RH STG MANAGE BOWEL WITH ASSISTANCE Description: STG Manage Bowel with mod I Assistance. Outcome: Progressing Goal: RH STG MANAGE BOWEL W/MEDICATION W/ASSISTANCE Description: STG Manage Bowel with Medication with  mod I Assistance. Outcome: Progressing   Problem: RH SAFETY Goal: RH STG ADHERE TO SAFETY PRECAUTIONS W/ASSISTANCE/DEVICE Description: STG Adhere to Safety Precautions With cues Assistance/Device. Outcome: Progressing   Problem: RH PAIN MANAGEMENT Goal: RH STG PAIN MANAGED AT OR BELOW PT'S PAIN GOAL Description: < 4 with prns Outcome: Progressing   Problem: RH KNOWLEDGE DEFICIT Goal: RH STG INCREASE KNOWLEDGE OF HYPERTENSION Description: Patient and family will be able to manage HTN with medications and educational resources for dietary modification, lifestyle modification independently. Outcome: Progressing

## 2023-08-26 NOTE — Progress Notes (Addendum)
 Patient ID: Erik Santana., male   DOB: [birth year], 31 y.o.   MRN: [id number]  [id number]- SW spoke with pt mother Efraim Kaufmann to introduce self, explain role, discuss discharge process and inform on ELOS. She reports they are continuing to work on care arrangements. States his sister will likely be there with him during the day, and she will be with him at night but still working on a plan. Pt mother asked if there could e follow-up about his Medicaid as SW reported per [facility] account pt was over income. SW shared sent an email and will follow-up once there is more information. SW will follow-up with updates after team conference.  *SW received updates from [employer] reporting that [place] submitted a medicaid application on 2/26.   Cecile Sheerer, MSW, LCSW Office: [phone] Cell: [phone] Fax: [phone]
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
## 2023-08-26 NOTE — Discharge Instructions (Addendum)
 Inpatient Rehab Discharge Instructions  Draxton Luu. Discharge date and time: No discharge date for patient encounter.   Activities/Precautions/ Functional Status: Activity: As tolerated Diet: Regular Wound Care: Routine skin checks Functional status:  ___ No restrictions     ___ Walk up steps independently ___ 24/7 supervision/assistance   ___ Walk up steps with assistance ___ Intermittent supervision/assistance  ___ Bathe/dress independently ___ Walk with walker     _x__ Bathe/dress with assistance ___ Walk Independently    ___ Shower independently ___ Walk with assistance    ___ Shower with assistance ___ No alcohol     ___ Return to work/school ________  Special Instructions:  No driving smoking or alcohol   COMMUNITY REFERRALS UPON DISCHARGE:    Outpatient: PT      OT                  Agency: [facility]    (aquatic therapy) Phone: [phone]              Appointment Date/Time: *Please expect follow-up within 7-10 business days to schedule your appointment. If you have not received follow-up, be sure to contact the site directly.*   Medical Equipment/Items Ordered:loftstrand crutches (to be delivered to the home) and  tub transfer bench                                                 Agency/Supplier:[employer] [phone]   Medical Equipment/Items Ordered:specialty wheelchair                                                 Agency/Supplier: [employer] # [phone]  GENERAL COMMUNITY RESOURCES FOR PATIENT/FAMILY: -If transportation services are needed for medical appointments, be sure to contact your insurance at [phone].  -A referral was sent for personal care services (PCS) to your insurance. Be sure to follow-up with the case management Dept at [phone] to check the status.     My questions have been answered and I understand these instructions. I will adhere to these goals and the provided educational materials after my discharge from the  hospital.  Patient/Caregiver Signature _______________________________ Date __________  Clinician Signature _______________________________________ Date __________  Please bring this form and your medication list with you to all your follow-up doctor's appointments.

## 2023-08-26 NOTE — IPOC Note (Addendum)
 Overall Plan of Care (IPOC) Patient Details Name: Erik Santana. MRN: [id number] DOB: Feb 16, 1991  Admitting Diagnosis: Intraparenchymal hematoma of brain [facility])  Hospital Problems: Principal Problem:   Intraparenchymal hematoma of brain (HCC)     Functional Problem List: Nursing Bowel, Safety, Endurance, Medication Management, Pain  PT Balance, Endurance, Motor, Pain, Perception, Safety  OT Balance, Sensory, Endurance, Motor, Pain, Perception, Safety, Behavior  SLP Cognition  TR         Basic ADL's: OT Grooming, Bathing, Dressing, Toileting     Advanced  ADL's: OT       Transfers: PT Bed Mobility, Bed to Chair, Car, Occupational psychologist, Research scientist (life sciences): PT Ambulation, Stairs     Additional Impairments: OT Fuctional Use of Upper Extremity  SLP Social Cognition   Memory, Attention, Awareness, Problem Solving  TR      Anticipated Outcomes Item Anticipated Outcome  Self Feeding n/a  Swallowing      Basic self-care  min A  Toileting  min A   Bathroom Transfers min  A  Bowel/Bladder  manage bowel mod I assist  Transfers  Supervision  Locomotion  MinA  Communication     Cognition  modI  Pain  manage pain < 4 with prns  Safety/Judgment  manage safety w cues   Therapy Plan: PT Intensity: Minimum of 1-2 x/day ,45 to 90 minutes PT Frequency: 5 out of 7 days PT Duration Estimated Length of Stay: 3-4 weeks OT Intensity: Minimum of 1-2 x/day, 45 to 90 minutes OT Frequency: 5 out of 7 days OT Duration/Estimated Length of Stay: ~ 4 weeks SLP Intensity: Minumum of 1-2 x/day, 30 to 90 minutes SLP Frequency: 1 to 3 out of 7 days SLP Duration/Estimated Length of Stay: 4 weeks   Team Interventions: Nursing Interventions Bowel Management, Disease Management/Prevention, Medication Management, Discharge Planning, Pain Management, Patient/Family Education  PT interventions Ambulation/gait training, Community reintegration, DME/adaptive equipment  instruction, Neuromuscular re-education, Museum/gallery curator, UE/LE Strength taining/ROM, Wheelchair propulsion/positioning, Psychosocial support, Warden/ranger, Discharge planning, Functional electrical stimulation, Pain management, Skin care/wound management, Therapeutic Activities, UE/LE Coordination activities, Cognitive remediation/compensation, Disease management/prevention, Functional mobility training, Patient/family education, Splinting/orthotics, Therapeutic Exercise, Visual/perceptual remediation/compensation  OT Interventions Balance/vestibular training, Disease mangement/prevention, Neuromuscular re-education, Self Care/advanced ADL retraining, Therapeutic Exercise, Wheelchair propulsion/positioning, Cognitive remediation/compensation, DME/adaptive equipment instruction, Pain management, Skin care/wound managment, UE/LE Strength taining/ROM, Community reintegration, Functional electrical stimulation, Patient/family education, Splinting/orthotics, UE/LE Coordination activities, Discharge planning, Functional mobility training, Psychosocial support, Therapeutic Activities, Visual/perceptual remediation/compensation  SLP Interventions Cognitive remediation/compensation, Internal/external aids, Environmental controls, Therapeutic Activities, Patient/family education, Functional tasks, Cueing hierarchy  TR Interventions    SW/CM Interventions Discharge Planning, Psychosocial Support, Patient/Family Education   Barriers to Discharge MD  Medical stability, Home enviroment access/loayout, Wound care, Lack of/limited family support, Insurance for SNF coverage, and Behavior  Nursing Decreased caregiver support 1 level ramped entry w mother  PT Home environment access/layout    OT      SLP      SW Decreased caregiver support, Lack of/limited family support, Insurance for SNF coverage, Other (comments) Uninsured.   Team Discharge Planning: Destination: PT-Home ,OT- Home ,  SLP-Home Projected Follow-up: PT-24 hour supervision/assistance, Outpatient PT, OT-  Outpatient OT, SLP-Other (comment) (don't anticipate continued ST needs by time of discharge) Projected Equipment Needs: PT-To be determined, OT- To be determined, SLP- none recommended Equipment Details: PT- , OT-  Patient/family involved in discharge planning: PT- Patient, Family Adult nurse,  OT-Patient, Family member/caregiver, SLP-Patient, Family member/caregiver  MD ELOS: 10-14 days Medical Rehab Prognosis:  Good Assessment: The patient has been admitted for CIR therapies with the diagnosis of nontraumatic intracranial hemorrhage. The team will be addressing functional mobility, strength, stamina, balance, safety, adaptive techniques and equipment, self-care, bowel and bladder mgt, patient and caregiver education. Goals have been set at Mod I PT/OT, Ind SLP. Anticipated discharge destination is home.       See Team Conference Notes for weekly updates to the plan of care

## 2023-08-26 NOTE — Progress Notes (Addendum)
 PROGRESS NOTE   Subjective/Complaints:  Multiple Atarax used for as needed anxiety overnight.  Patient states this has been effective for his anxiety, and that he slept approximately 5 hours overnight with trazodone and melatonin, which was a huge improvement from prior.    Feels that scopolamine patch worked well yesterday for dizziness, but that it is increased this morning.  Notices it is worse when looking down into the left.  Spasticity improved with increasing baclofen, is able to get his hands around a foam ball today.  Ongoing reports of severe spasticity and clonus with therapies.  No reports of pain overnight, Poor p.o. intakes recorded, patient brought in dinner last night.  Is eating [employer] on exam, denies any decrease in appetite. 2 times bowel movement yesterday.  ROS: Denies fevers, chills, N/V, abdominal pain, constipation, diarrhea, SOB, cough, chest pain, new weakness or paraesthesias.   + insomnia--improved + vertigo--ongoing, LLQ + Anxiety --ongoing + LUE and LLE spasticity  Objective:   No results found. Recent Labs    08/24/23 0605 08/25/23 0603  WBC 8.9 8.0  HGB 16.0 16.7  HCT 45.0 46.8  PLT 317 333   Recent Labs    08/24/23 0605 08/25/23 0603  NA 136 138  K 3.2* 3.4*  CL 104 102  CO2 22 24  GLUCOSE 103* 100*  BUN 9 8  CREATININE 0.87 0.92  CALCIUM 9.2 9.5    Intake/Output Summary (Last 24 hours) at 08/26/2023 0948 Last data filed at 08/25/2023 1841 Gross per 24 hour  Intake 360 ml  Output 350 ml  Net 10 ml        Physical Exam: Vital Signs Blood pressure 112/70, pulse 85, temperature 97.9 F (36.6 C), temperature source Oral, resp. rate 16, height 6\' 2"  (1.88 m), weight 76.8 kg, SpO2 97%.  Constitutional: No apparent distress. Appropriate appearance for age.  Sitting up in bed. HENT: No JVD. Neck Supple. Trachea midline. Atraumatic, normocephalic. Eyes: PERRLA. EOMI.  Visual fields grossly intact.  + Vertiginous symptoms with leftward gaze, horizontal and vertical saccades, not associated with headache or vision deficits.  Mild horizontal nystagmus.--ongoing  Cardiovascular: RRR, no murmurs/rub/gallops. No Edema. Peripheral pulses 2+  Respiratory: CTAB. No rales, rhonchi, or wheezing. On RA.  Abdomen: + bowel sounds, normoactive. No distention or tenderness.  Skin: C/D/I. No apparent lesions.  Peripheral IV intact.   MSK:      No apparent deformity.  Some apparent atrophy of left wrist extensor compartment.        Neurologic exam:  Cognition: AAO to person, place, time and event.  Language: Fluent, No substitutions or neoglisms. No dysarthria. Names 3/3 objects correctly.  Memory: No apparent deficits Insight: Fair insight into current condition.  Mood: Anxious mood, elevated but appropriate--improved from yesterday Sensation: Equal and intact in BL UE and Les.  Reflexes: 2+ in R  UE and LE; left lower extremity clonus that extinguishes with heel stretch.  CN: 2-12 grossly intact.  Coordination: No apparent tremors. No ataxia on FTN, HTS on R Spasticity:  Left upper extremity: MAS 1+ shoulder abductors, elbow flexors, elbow extensors; MAS 3 wrist flexors; MAS 3 finger flexors, MAS 1 thumb adductors--improved  Left  lower extremity: MAS 2 hip flexors, MAS 2 knee extensors, MAS 1 knee flexors, MAS 2 plantar flexors--improved       Strength:                LUE: 0/5 SA, 1/5 EF, 2-/5 EE, 0/5 WE, 1/5 FF, 0/5 FA                RUE:  5/5 SA, 5/5 EF, 5/5 EE, 5/5 WE, 5/5 FF, 5/5 FA                LLE: 3/5 HF, 3/5 KE, 0/5  DF, 0/5  EHL, 1+/5  PF                 RLE:  5/5 HF, 5/5 KE, 5/5  DF, 5/5  EHL, 5/5  PF       Assessment/Plan: 1. Functional deficits which require 3+ hours per day of interdisciplinary therapy in a comprehensive inpatient rehab setting. Physiatrist is providing close team supervision and 24 hour management of active medical problems  listed below. Physiatrist and rehab team continue to assess barriers to discharge/monitor patient progress toward functional and medical goals  Care Tool:  Bathing    Body parts bathed by patient: Left arm, Chest, Abdomen, Front perineal area, Right upper leg, Left upper leg, Face   Body parts bathed by helper: Buttocks, Right lower leg, Left lower leg     Bathing assist Assist Level: Moderate Assistance - Patient 50 - 74%     Upper Body Dressing/Undressing Upper body dressing   What is the patient wearing?: Pull over shirt    Upper body assist Assist Level: Moderate Assistance - Patient 50 - 74%    Lower Body Dressing/Undressing Lower body dressing      What is the patient wearing?: Pants     Lower body assist Assist for lower body dressing: 2 Helpers     Toileting Toileting    Toileting assist Assist for toileting: Maximal Assistance - Patient 25 - 49%     Transfers Chair/bed transfer  Transfers assist     Chair/bed transfer assist level: Maximal Assistance - Patient 25 - 49%     Locomotion Ambulation   Ambulation assist      Assist level: 2 helpers Assistive device:  (Rt hand rail) Max distance: 5'   Walk 10 feet activity   Assist  Walk 10 feet activity did not occur: Safety/medical concerns (Fatigue)        Walk 50 feet activity   Assist Walk 50 feet with 2 turns activity did not occur: Safety/medical concerns         Walk 150 feet activity   Assist Walk 150 feet activity did not occur: Safety/medical concerns         Walk 10 feet on uneven surface  activity   Assist Walk 10 feet on uneven surfaces activity did not occur: Safety/medical concerns         Wheelchair     Assist Is the patient using a wheelchair?: Yes Type of Wheelchair: Manual    Wheelchair assist level: Dependent - Patient 0% Max wheelchair distance: 150'    Wheelchair 50 feet with 2 turns activity    Assist        Assist Level:  Dependent - Patient 0%   Wheelchair 150 feet activity     Assist      Assist Level: Dependent - Patient 0%   Blood pressure 112/70, pulse 85, temperature 97.9 F (  36.6 C), temperature source Oral, resp. rate 16, height 6\' 2"  (1.88 m), weight 76.8 kg, SpO2 97%.   Medical Problem List and Plan: 1. 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  Antithrombotics: -DVT/anticoagulation:  Pharmaceutical: Lovenox             -antiplatelet therapy: N/A   3. Pain Management:    - 2/27: Tylenol 350 to 650 mg every 6 hours for mild to moderate pain, tramadol 50 mg every 6 hours as needed for severe pain.  Has minimal complaints of pain so far, would consider gabapentin if additional needed.  4. Mood/Behavior/Sleep: Provide emotional support             -antipsychotic agents: N/A  -2-27: Was using Xanax at home for sleep.  Has Atarax on board, add as needed melatonin and scheduled trazodone 100 mg nightly.  Will get sleep log.  2-28: Sleep improved with trazodone and as needed melatonin; continue.   -Could consider addition of BuSpar 5 mg twice daily   5. Neuropsych/cognition: This patient is capable of making decisions on his own behalf.   - reporting severe ongoing anxiety with staff, recommending neuropsychiatry and behavioral assessment as outpatient 6. Skin/Wound Care: Routine skin checks   7. Fluids/Electrolytes/Nutrition: Routine in and outs with follow-up chemistries   -Potassium slightly low 3.4, otherwise labs stable.  8.  Tobacco use.  [name]- consider discussion of decrease in dose given elevated blood pressure.  Provide counseling   9.  History of polysubstance use.  Urine drug screen positive marijuana.  Patient does endorse using Ativan and Percocet off the  streets.  Provide counseling   10.  Constipation/diarrhea.  Continue Senokot S1 tablet twice daily.   -Multiple liquid stools overnight, large; DC Senokot  Improved  11.  Spasticity/myoclonus.  -Ordered left WHO today, may need custom per OT  -Increase baclofen from 10 mg twice daily to 10 mg 3 times daily; add Zanaflex 2 mg every 8 hours as needed for spasms  -Monitor with therapy; would consider addition of antiepileptic if significant myoclonus  -Discussed with patient this a.m., may need Botox exam and injection while in inpatient rehab for left finger flexor tightness, will monitor 1 week on medication prior to pursuing  -2-28: Tone improved with increased baclofen; per therapy reports, ongoing issues with LLE myoclonus, could benefit from Depakote for this and ongoing mood/behavior difficulties, but will hold off to see if any continued improvement over the weekend on current medications.  12. Bradycardia - episodes of high 40s/low 50s intermittent throughout hospitalization  - EKG today NSR 60 bpm, with QTC 440   - No obvious medication contributors - very rare s/e to tramadol  - If  symptomatic, could consider cardiology consult and holter monitor/ziopatch  LOS: 2 days A FACE TO FACE EVALUATION WAS PERFORMED  Angelina Sheriff 08/26/2023, 9:48 AM

## 2023-08-26 NOTE — Telephone Encounter (Signed)
Error

## 2023-08-26 NOTE — Progress Notes (Signed)
 Occupational Therapy TBI Note  Patient Details  Name: Erik Santana. MRN: [id number] Date of Birth: Mar 24, 1991  Today's Date: 08/26/2023 OT Individual Time: 4098-1191 OT Individual Time Calculation (min): 73 min    Short Term Goals: Week 1:  OT Short Term Goal 1 (Week 1): Pt will use left UE as gross stabilization in grooming task OT Short Term Goal 2 (Week 1): Pt will be given instructions on self ROM to do in bed with left UE OT Short Term Goal 3 (Week 1): Pt will transfer with mod A to toilet  Skilled Therapeutic Interventions/Progress Updates:    Pt received supine with no c/o pain, agreeable to OT session. Provided comprehensive LUE/LLE PROM prior to initiating movement to promote improved joint flexibility, maintain muscle length, and minimize tone. He reported no pain with the PROM, reporting it "felt good". He donned shorts with max A, OT threading over BLE and pt bridging to pull up over hips. He came to EOB with mod A. Good carryover to technique to bring LLE to EOB. Once EOB he required heavy max A to maintain LLE knee flexion and then min A to maintain sitting balance. He completed a stand pivot transfer with mod A toward the R to the TIS w/c. He completed oral care at the sink with set up assist. He complained of "vertigo and dizziness" throughout the session, worsening with looking into L inferior field. Pt was taken via w/c to the therapy gym for time management. In the parallel bars he worked on sit > stands and mini squats to address functional weight bearing of the LLE, tone management, and NMR. Pt able to maintain balance with RUE support on the bar and OT providing min A at the trunk as well as heavy max A to flex/ext L knee. He completed 3x10 repetitions. Switched out his TIS w/c for a different model d/t pressure injury risk with pt's B shoulders resting against handles. Remainder of session focused on e-stim of his LUE, focusing first on finger flexion and then finger/wrist  extension. Parameters below. He completed active assist finger flexion, with volitional movement observed in fingers 1-3, about 5 degrees. No active extension but good response to e-stim. He returned to his room following and was left sitting up with all needs met.    1:1 NMES Ratio 1:3 Rate 35 pps Waveform- Asymmetric Ramp 1.0 Pulse 300 Intensity- 16 Duration - 10 min     Therapy Documentation Precautions:  Precautions Precautions: Fall Precaution/Restrictions Comments: left LE tone Restrictions Weight Bearing Restrictions Per Provider Order: No  Agitated Behavior Scale: TBI Observation Details Observation Environment: CIR Start of observation period - Date: 08/26/23 Start of observation period - Time: 0730 End of observation period - Date: 08/26/23 End of observation period - Time: 0845 Agitated Behavior Scale (DO NOT LEAVE BLANKS) Short attention span, easy distractibility, inability to concentrate: Present to a slight degree Impulsive, impatient, low tolerance for pain or frustration: Absent Uncooperative, resistant to care, demanding: Absent Violent and/or threatening violence toward people or property: Absent Explosive and/or unpredictable anger: Absent Rocking, rubbing, moaning, or other self-stimulating behavior: Absent Pulling at tubes, restraints, etc.: Absent Wandering from treatment areas: Absent Restlessness, pacing, excessive movement: Absent Repetitive behaviors, motor, and/or verbal: Absent Rapid, loud, or excessive talking: Absent Sudden changes of mood: Absent Easily initiated or excessive crying and/or laughter: Absent Self-abusiveness, physical and/or verbal: Absent Agitated behavior scale total score: 15    Therapy/Group: Individual Therapy  Crissie Reese 08/26/2023, 9:01 AM

## 2023-08-26 NOTE — Progress Notes (Addendum)
 Met with patient , sister and friend in room to review current situation , team conference and plan of care . Reviewed pain, sleep, bowel and bladder .  Reviewed medications and dietary recommendations of secondary risks including tobacco use . Set -up my chart and gave lists of primary care doctors near them.   Continue to follow along to to provide educational needs to facilitate preparation for discharge.   1416 Appointment set up for on boarding with PCP at [facility] at [facility], [place] mid May: working on getting an earlier appointment. Awaiting call to patient at the end of the day or Monday.

## 2023-08-26 NOTE — Progress Notes (Signed)
 Occupational Therapy Session Note  Patient Details  Name: Erik Santana. MRN: [id number] Date of Birth: [birth year]  Today's Date: 08/26/2023 OT Individual Time: 7829-5621 OT Individual Time Calculation (min): 58 min    Short Term Goals: Week 1:  OT Short Term Goal 1 (Week 1): Pt will use left UE as gross stabilization in grooming task OT Short Term Goal 2 (Week 1): Pt will be given instructions on self ROM to do in bed with left UE OT Short Term Goal 3 (Week 1): Pt will transfer with mod A to toilet  Skilled Therapeutic Interventions/Progress Updates:  Pt greeted seated in w/c, pt agreeable to OT intervention.      Transfers/bed mobility/functional mobility: pt completed x2 squat pivot transfers to R and L side with MODA, pt requires MIN verbal cues for set- up and technique and assist to manage tone in LLE during transfers.   NMR:  -Pt completed blocked practice of sit>stands at railing in hallway with LLE supported with a focus on breaking up tone and decreasing clonus in LLE. Pt completed stands with MIN- MODA, emphasis on weight shifting laterally as precursor to higher level functional mobility. Pt able to elevate R heel of floor in standing to provide increased weightbearing to LLE.  -transitioned pt to prone position on mat table with pt able to transition into perched position on bilateral forearms to promote NMR to LUE -provide PROM stretch to LUE to decrease tone from seated position in w/c.   General/education: secured towel to L foot rest with coband to provide increased support/comfort d/t clonus in LLE. Education provided throughout session on tone mgmt and recommended PROM to LUE daily with pt able to return demo stretches.     Ended session with pt supine in bed with all needs within reach and bed alarm activated.                    Therapy Documentation Precautions:  Precautions Precautions: Fall Precaution/Restrictions Comments: left LE  tone Restrictions Weight Bearing Restrictions Per Provider Order: No  Pain: no pain reported during session, pt does endorse intermittent dizziness needing frequent rest breaks and repositioning.     Therapy/Group: Individual Therapy  Pollyann [facility] 08/26/2023, 10:49 AM

## 2023-08-26 NOTE — Plan of Care (Signed)
  Problem: RH BOWEL ELIMINATION Goal: RH STG MANAGE BOWEL WITH ASSISTANCE Description: STG Manage Bowel with mod I  Assistance. Outcome: Progressing   Problem: RH SAFETY Goal: RH STG ADHERE TO SAFETY PRECAUTIONS W/ASSISTANCE/DEVICE Description: STG Adhere to Safety Precautions With cues Assistance/Device. Outcome: Progressing   Problem: RH PAIN MANAGEMENT Goal: RH STG PAIN MANAGED AT OR BELOW PT'S PAIN GOAL Description: < 4 with prns Outcome: Progressing

## 2023-08-26 NOTE — Discharge Summary (Signed)
 Physician Discharge Summary  Patient ID: Erik Santana. MRN: [id number] DOB/AGE: [birth year] 33 y.o.  Admit date: 08/24/2023 Discharge date: 09/22/2023  Discharge Diagnoses:  Principal Problem:   Intraparenchymal hematoma of brain [facility]) Active Problems:   Cognitive change DVT prophylaxis Tobacco use History of polysubstance use Constipation Anxiety/panic attacks History of Mallory-Weiss tear  Discharged Condition: Stable  Significant Diagnostic Studies: DG Lumbar Spine 2-3 Views Result Date: 09/15/2023 CLINICAL DATA:  Chronic bilateral low back pain without sciatica. EXAM: LUMBAR SPINE - 2-3 VIEW COMPARISON:  None Available. FINDINGS: Five non-rib-bearing lumbar vertebra. Normal alignment. Normal vertebral body heights. Slight anterior spurring at L1-L2. Disc spaces are preserved. No evidence of fracture, pars defects or focal bone abnormalities. The sacroiliac joints are congruent IMPRESSION: Slight anterior spurring at L1-L2. Electronically Signed   By: Narda Rutherford M.D.   On: 09/15/2023 18:58   IR ANGIO INTRA EXTRACRAN SEL COM CAROTID INNOMINATE BILAT MOD SED Result Date: 08/26/2023 CLINICAL DATA:  Right frontal intra cerebral hemorrhage. Left-sided hemiplegia. EXAM: BILATERAL COMMON CAROTID AND INNOMINATE ANGIOGRAPHY COMPARISON:  CT angiogram of the head and neck August 21, 2023, and CT of the brain of August 21, 2023. MEDICATIONS: Heparin 2000 units IV. No antibiotic was administered within 1 hour of the procedure. ANESTHESIA/SEDATION: Versed 1.5 mg; Fentanyl 37.5 mcg IV Moderate Sedation Time:  46 minutes The patient was continuously monitored during the procedure by the interventional radiology nurse under my direct supervision. CONTRAST:  Omnipaque 300 approximately 70 mL. FLUOROSCOPY TIME:  Fluoroscopy Time: 9 minutes 36 seconds (384 mGy). COMPLICATIONS: None immediate. TECHNIQUE: Informed written consent was obtained from the patient after a thorough discussion of the  procedural risks, benefits and alternatives. All questions were addressed. Maximal Sterile Barrier Technique was utilized including caps, mask, sterile gowns, sterile gloves, sterile drape, hand hygiene and skin antiseptic. A timeout was performed prior to the initiation of the procedure. The right forearm to the wrist was prepped and draped in the usual sterile manner. The right radial artery was identified and its morphology documented in the radiology PACS system. A dorsal palmar anastomosis was verified to be present. Using ultrasound guidance, access into the right radial artery was obtained with a 4/5 French radial sheath over an 018 inch micro guidewire. The micro guidewire, and the obturator were removed. Good aspiration was obtained from the side port of the radial sheath. A cocktail of 2000 units of heparin, 200 mcg of nitroglycerin, and 2.5 mg of verapamil was then infused in diluted form without event. A right radial arteriogram was then performed. Over an 035 inch Roadrunner guidewire, a 5 French Simmons 2 diagnostic catheter was then advanced to the aortic arch region, and selectively positioned in the right vertebral artery, the right common carotid artery, the left common carotid artery and the left subclavian artery. A wrist band was applied for hemostasis at the right radial puncture site. Right radial pulse was verified to be present. FINDINGS: Part of the study is marred by motion artifact. The dominant right vertebral artery origin is widely patent. Vessel opacifies to the cranial skull base. Grossly patency is maintained of the right posterior-inferior cerebellar artery. The opacified portions of the basilar artery, the posterior cerebral arteries, superior cerebellar arteries and the anterior-inferior cerebellar arteries demonstrate patency into the capillary and venous phases. Unopacified blood is seen in the posterior cerebral arteries and the superior cerebellar arteries from the  contralateral left vertebral artery. The right common carotid arteriogram demonstrates the right external carotid artery and its major  branches to be widely patent. The right internal carotid artery at the bulb to the cranial skull base is normal. The petrous, the cavernous and the supraclinoid right ICA demonstrate wide patency. Right posterior communicating artery is seen partially opacifying the right posterior cerebral artery distribution. The right middle cerebral artery and the right anterior cerebral artery opacify into the capillary and venous phases. Mild mass effect is noted on the right anterior cerebral artery A2 A3 junction secondary to the overlying hematoma. Suggestion of mild hyperemia is noted in the right parasagittal axis probably secondary to the hematoma. Mass effect on the adjacent mid parasagittal frontal vein is noted. The left common carotid arteriogram demonstrates the left external carotid artery and its major branches to be widely patent. The left internal carotid artery at the bulb to the cranial skull base demonstrates wide patency. The petrous, the cavernous and the supraclinoid left ICA are widely patent. The left middle cerebral artery and the left anterior cerebral artery opacify into the capillary and venous phases. The left vertebral artery origin is widely patent. The vessel is seen to opacify to the cranial skull base to the level of the left posterior-inferior cerebellar artery and the proximal left vertebrobasilar junction. IMPRESSION: Angiographically no gross evidence of an arteriovenous malformation, aneurysm, dissection, or dural AV fistula noted. Mass effect on the right mid parasagittal frontal cortical vein probably hematoma probably secondary to the underlying hematoma. PLAN: Recommend follow-up diagnostic catheter arteriogram in approximately 2-3 weeks time. Discussed with the patient and mother. Electronically Signed   By: Julieanne Cotton M.D.   On: 08/26/2023  17:51   IR ANGIO VERTEBRAL SEL VERTEBRAL UNI R MOD SED Result Date: 08/26/2023 CLINICAL DATA:  Right frontal intra cerebral hemorrhage. Left-sided hemiplegia. EXAM: BILATERAL COMMON CAROTID AND INNOMINATE ANGIOGRAPHY COMPARISON:  CT angiogram of the head and neck August 21, 2023, and CT of the brain of August 21, 2023. MEDICATIONS: Heparin 2000 units IV. No antibiotic was administered within 1 hour of the procedure. ANESTHESIA/SEDATION: Versed 1.5 mg; Fentanyl 37.5 mcg IV Moderate Sedation Time:  46 minutes The patient was continuously monitored during the procedure by the interventional radiology nurse under my direct supervision. CONTRAST:  Omnipaque 300 approximately 70 mL. FLUOROSCOPY TIME:  Fluoroscopy Time: 9 minutes 36 seconds (384 mGy). COMPLICATIONS: None immediate. TECHNIQUE: Informed written consent was obtained from the patient after a thorough discussion of the procedural risks, benefits and alternatives. All questions were addressed. Maximal Sterile Barrier Technique was utilized including caps, mask, sterile gowns, sterile gloves, sterile drape, hand hygiene and skin antiseptic. A timeout was performed prior to the initiation of the procedure. The right forearm to the wrist was prepped and draped in the usual sterile manner. The right radial artery was identified and its morphology documented in the radiology PACS system. A dorsal palmar anastomosis was verified to be present. Using ultrasound guidance, access into the right radial artery was obtained with a 4/5 French radial sheath over an 018 inch micro guidewire. The micro guidewire, and the obturator were removed. Good aspiration was obtained from the side port of the radial sheath. A cocktail of 2000 units of heparin, 200 mcg of nitroglycerin, and 2.5 mg of verapamil was then infused in diluted form without event. A right radial arteriogram was then performed. Over an 035 inch Roadrunner guidewire, a 5 French Simmons 2 diagnostic catheter  was then advanced to the aortic arch region, and selectively positioned in the right vertebral artery, the right common carotid artery, the left common  carotid artery and the left subclavian artery. A wrist band was applied for hemostasis at the right radial puncture site. Right radial pulse was verified to be present. FINDINGS: Part of the study is marred by motion artifact. The dominant right vertebral artery origin is widely patent. Vessel opacifies to the cranial skull base. Grossly patency is maintained of the right posterior-inferior cerebellar artery. The opacified portions of the basilar artery, the posterior cerebral arteries, superior cerebellar arteries and the anterior-inferior cerebellar arteries demonstrate patency into the capillary and venous phases. Unopacified blood is seen in the posterior cerebral arteries and the superior cerebellar arteries from the contralateral left vertebral artery. The right common carotid arteriogram demonstrates the right external carotid artery and its major branches to be widely patent. The right internal carotid artery at the bulb to the cranial skull base is normal. The petrous, the cavernous and the supraclinoid right ICA demonstrate wide patency. Right posterior communicating artery is seen partially opacifying the right posterior cerebral artery distribution. The right middle cerebral artery and the right anterior cerebral artery opacify into the capillary and venous phases. Mild mass effect is noted on the right anterior cerebral artery A2 A3 junction secondary to the overlying hematoma. Suggestion of mild hyperemia is noted in the right parasagittal axis probably secondary to the hematoma. Mass effect on the adjacent mid parasagittal frontal vein is noted. The left common carotid arteriogram demonstrates the left external carotid artery and its major branches to be widely patent. The left internal carotid artery at the bulb to the cranial skull base demonstrates  wide patency. The petrous, the cavernous and the supraclinoid left ICA are widely patent. The left middle cerebral artery and the left anterior cerebral artery opacify into the capillary and venous phases. The left vertebral artery origin is widely patent. The vessel is seen to opacify to the cranial skull base to the level of the left posterior-inferior cerebellar artery and the proximal left vertebrobasilar junction. IMPRESSION: Angiographically no gross evidence of an arteriovenous malformation, aneurysm, dissection, or dural AV fistula noted. Mass effect on the right mid parasagittal frontal cortical vein probably hematoma probably secondary to the underlying hematoma. PLAN: Recommend follow-up diagnostic catheter arteriogram in approximately 2-3 weeks time. Discussed with the patient and mother. Electronically Signed   By: Julieanne Cotton M.D.   On: 08/26/2023 17:51   IR ANGIO VERTEBRAL SEL SUBCLAVIAN INNOMINATE UNI L MOD SED Result Date: 08/26/2023 CLINICAL DATA:  Right frontal intra cerebral hemorrhage. Left-sided hemiplegia. EXAM: BILATERAL COMMON CAROTID AND INNOMINATE ANGIOGRAPHY COMPARISON:  CT angiogram of the head and neck August 21, 2023, and CT of the brain of August 21, 2023. MEDICATIONS: Heparin 2000 units IV. No antibiotic was administered within 1 hour of the procedure. ANESTHESIA/SEDATION: Versed 1.5 mg; Fentanyl 37.5 mcg IV Moderate Sedation Time:  46 minutes The patient was continuously monitored during the procedure by the interventional radiology nurse under my direct supervision. CONTRAST:  Omnipaque 300 approximately 70 mL. FLUOROSCOPY TIME:  Fluoroscopy Time: 9 minutes 36 seconds (384 mGy). COMPLICATIONS: None immediate. TECHNIQUE: Informed written consent was obtained from the patient after a thorough discussion of the procedural risks, benefits and alternatives. All questions were addressed. Maximal Sterile Barrier Technique was utilized including caps, mask, sterile gowns,  sterile gloves, sterile drape, hand hygiene and skin antiseptic. A timeout was performed prior to the initiation of the procedure. The right forearm to the wrist was prepped and draped in the usual sterile manner. The right radial artery was identified and its morphology  documented in the radiology PACS system. A dorsal palmar anastomosis was verified to be present. Using ultrasound guidance, access into the right radial artery was obtained with a 4/5 French radial sheath over an 018 inch micro guidewire. The micro guidewire, and the obturator were removed. Good aspiration was obtained from the side port of the radial sheath. A cocktail of 2000 units of heparin, 200 mcg of nitroglycerin, and 2.5 mg of verapamil was then infused in diluted form without event. A right radial arteriogram was then performed. Over an 035 inch Roadrunner guidewire, a 5 French Simmons 2 diagnostic catheter was then advanced to the aortic arch region, and selectively positioned in the right vertebral artery, the right common carotid artery, the left common carotid artery and the left subclavian artery. A wrist band was applied for hemostasis at the right radial puncture site. Right radial pulse was verified to be present. FINDINGS: Part of the study is marred by motion artifact. The dominant right vertebral artery origin is widely patent. Vessel opacifies to the cranial skull base. Grossly patency is maintained of the right posterior-inferior cerebellar artery. The opacified portions of the basilar artery, the posterior cerebral arteries, superior cerebellar arteries and the anterior-inferior cerebellar arteries demonstrate patency into the capillary and venous phases. Unopacified blood is seen in the posterior cerebral arteries and the superior cerebellar arteries from the contralateral left vertebral artery. The right common carotid arteriogram demonstrates the right external carotid artery and its major branches to be widely patent. The  right internal carotid artery at the bulb to the cranial skull base is normal. The petrous, the cavernous and the supraclinoid right ICA demonstrate wide patency. Right posterior communicating artery is seen partially opacifying the right posterior cerebral artery distribution. The right middle cerebral artery and the right anterior cerebral artery opacify into the capillary and venous phases. Mild mass effect is noted on the right anterior cerebral artery A2 A3 junction secondary to the overlying hematoma. Suggestion of mild hyperemia is noted in the right parasagittal axis probably secondary to the hematoma. Mass effect on the adjacent mid parasagittal frontal vein is noted. The left common carotid arteriogram demonstrates the left external carotid artery and its major branches to be widely patent. The left internal carotid artery at the bulb to the cranial skull base demonstrates wide patency. The petrous, the cavernous and the supraclinoid left ICA are widely patent. The left middle cerebral artery and the left anterior cerebral artery opacify into the capillary and venous phases. The left vertebral artery origin is widely patent. The vessel is seen to opacify to the cranial skull base to the level of the left posterior-inferior cerebellar artery and the proximal left vertebrobasilar junction. IMPRESSION: Angiographically no gross evidence of an arteriovenous malformation, aneurysm, dissection, or dural AV fistula noted. Mass effect on the right mid parasagittal frontal cortical vein probably hematoma probably secondary to the underlying hematoma. PLAN: Recommend follow-up diagnostic catheter arteriogram in approximately 2-3 weeks time. Discussed with the patient and mother. Electronically Signed   By: Julieanne Cotton M.D.   On: 08/26/2023 17:51    Labs:  Basic Metabolic Panel: Recent Labs  Lab 09/19/23 0522 09/21/23 0931  NA 139 139  K 3.7 4.1  CL 102 102  CO2 29 27  GLUCOSE 84 79  BUN 13 11   CREATININE 1.00 0.98  CALCIUM 9.3 9.8    CBC: Recent Labs  Lab 09/19/23 0522  WBC 6.9  HGB 15.3  HCT 44.7  MCV 84.2  PLT 297  CBG: No results for input(s): "GLUCAP" in the last 168 hours.  Family history.  Unremarkable  Brief HPI:   Erik Nazar. is a 33 y.o. 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  Patient endorses using Ativan and Percocet off the streets.  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  1 level home with ramped entrance.  Good family support.  Presented 08/21/2023 with acute onset of left-sided weakness as well as dizziness and headache.  He was able to call EMS himself.  EMS found him on the floor.  Blood pressure 130/81.  CT of the head showed a 3.9 x 2.4 x 4.1 cm parenchymal hemorrhage centered at the right frontal parietal junction.  No evidence of intraventricular extension or mass effect.  CT cervical spine negative.  CTA showed no intracranial large vessel occlusion or significant stenosis.  Echocardiogram with ejection fraction of 55 to 60% no wall motion abnormalities.  Admission chemistries unremarkable except glucose 157 urine drug screen positive marijuana.  MRI follow-up showed unchanged appearance of intraparenchymal hematoma within the superior right frontal lobe.  No mass lesion.  Arteriogram completed showing no gross abnormalities.  Neurology follow-up with conservative care.  Lovenox added for DVT prophylaxis 08/23/2023.  [name] added for tobacco use.  Blood pressure controlled he has not required Cleviprex.  Therapy evaluations completed due to patient decreased functional mobility was admitted for a comprehensive rehab program.   Hospital Course: Erik Shahin. was admitted to rehab 08/24/2023 for inpatient therapies to consist of PT, ST and OT at least three hours five days a week. Past admission physiatrist, therapy team  and rehab RN have worked together to provide customized collaborative inpatient rehab.  Pertaining to patient's intraparenchymal hemorrhage of the right frontal parietal junction remained stable conservative care follow-up neurology services.  Lovenox initiated for DVT prophylaxis no bleeding episodes.  Pain management by using baclofen as directed as well as Zanaflex /Lidoderm patch and tramadol PRN.  Patient's mood and anxiety he was doing well with the addition of Depakote use both for panic attacks as well as some myoclonus and mood stabilization as well as BuSpar added for panic attacks with Atarax as needed.  He did receive follow-up by neuropsychology as well as ambulatory referral obtained with behavioral health.  History of tobacco use receiving counts regards to cessation of nicotine products as well as history of polysubstance abuse endorsing use of Ativan and Percocet off the streets with counseling again provided.  Bouts of constipation resolved with laxative assistance.   Blood pressures were monitored on TID basis and controlled and monitored     Rehab course: During patient's stay in rehab weekly team conferences were held to monitor patient's progress, set goals and discuss barriers to discharge. At admission, patient required moderate assist supine to sit +2 physical assist sit to stand  Physical exam.  Blood pressure 118/70 pulse 80 temperature 98 respirations 18 oxygen saturation 98% room air Constitutional.  No acute distress HEENT Head.  Normocephalic and atraumatic Eyes.  Pupils round and reactive to light no discharge without nystagmus Neck.  Supple nontender no JVD without thyromegaly Cardiac regular rate and rhythm without any extra sounds or murmur heard Abdomen.  Soft nontender positive bowel sounds without rebound Respiratory effort normal no respiratory distress without wheeze Skin.  Intact Neurologic.  Alert and oriented x 3 following commands  He/She  has had  improvement in activity tolerance, balance,  postural control as well as ability to compensate for deficits. He/She has had improvement in functional use RUE/LUE  and RLE/LLE as well as improvement in awareness.Self propels WC x 150 fet with RUE/RLE.  Perform sit to stand multiple times during sessions with close supervision and cues for hand placement.  Patient performs repeated set up steps on 6 inch step with left lower extremity on step in physical therapy providing tactile cues.  He completed 100 feet for ambulation with [place]ft[place] crutch on the right side instead of a large-base quad cane.  He performed bed mobility at supervision level.  Full family teaching completed plan discharge to home.       Disposition:  Discharge disposition: 01-Home or Self Care        Diet: Regular  Special Instructions: No driving smoking or alcohol  Medications at discharge. 1.  Tylenol as needed 2.  Baclofen 15 mg p.o. 3 times daily 3.  Antivert 25 mg p.o. 3 times daily as needed dizziness 4.  Scopolamine patch 1.5 mg change every 72 hours 5.  Zanaflex 4 mg 3 times daily 6.  Tramadol 50 mg every 6 hours as needed severe pain 7.  BuSpar 10 mg p.o. 3 times daily 8.  Depakote 500 mg p.o. daily 9.  Lidoderm patch changes directed 10.  Trazodone 100 mg nightly 11.  Vitamin D 50,000 units every 7 days 12.  Atarax 25 mg 3 times daily as needed anxiety 13.  Claritin 10 mg daily   30-35 minutes were spent completing discharge summary and discharge planning  Discharge Instructions     Ambulatory referral to Behavioral Health   Complete by: As directed    Evaluate and treat anxiety/panic attacks   Ambulatory referral to Neurology   Complete by: As directed    An appointment is requested in approximately: 4 weeks intraparenchymal hemorrhage   Ambulatory referral to Physical Medicine Rehab   Complete by: As directed    Moderate complexity follow-up 1 to 2 weeks [facility]        Follow-up  Information     Angelina Sheriff, DO Follow up.   Specialty: Physical Medicine and Rehabilitation Why: Office to call for appointment Contact information: [address] [place] [address] [phone]                 Signed: Charlton Amor 09/22/2023, 5:07 AM

## 2023-08-26 NOTE — Care Management (Signed)
 [facility]patient [facility] Individual Statement of Services  Patient Name:  Erik Santana.  Date:  08/26/2023  Welcome to the [facility]patient Rehabilitation [facility].  Our goal is to provide you with an individualized program based on your diagnosis and situation, designed to meet your specific needs.  With this comprehensive rehabilitation program, you will be expected to participate in at least 3 hours of rehabilitation therapies Monday-Friday, with modified therapy programming on the weekends.  Your rehabilitation program will include the following services:  Physical Therapy (PT), Occupational Therapy (OT), Speech Therapy (ST), 24 hour per day rehabilitation nursing, Therapeutic Recreaction (TR), Psychology, Neuropsychology, Care Coordinator, Rehabilitation Medicine, Nutrition Services, Pharmacy Services, and Other  Weekly team conferences will be held on Tuesdays to discuss your progress.  Your Inpatient Rehabilitation Care Coordinator will talk with you frequently to get your input and to update you on team discussions.  Team conferences with you and your family in attendance may also be held.  Expected length of stay: 4 weeks     Overall anticipated outcome: Minimal Assistance  Depending on your progress and recovery, your program may change. Your Inpatient Rehabilitation Care Coordinator will coordinate services and will keep you informed of any changes. Your Inpatient Rehabilitation Care Coordinator's name and contact numbers are listed  below.  The following services may also be recommended but are not provided by the Inpatient Rehabilitation Center:  Driving Evaluations Home Health Rehabiltiation Services Outpatient Rehabilitation Services Vocational Rehabilitation   Arrangements will be made to provide these services after discharge if needed.  Arrangements include referral to agencies that provide these services.  Your insurance has been verified to be:  Uninsured  Your  primary doctor is:  No PCP listed  Pertinent information will be shared with your doctor and your insurance company.  Inpatient Rehabilitation Care Coordinator:  Susie Cassette [phone] or ([phone]  Information discussed with and copy given to patient by: Gretchen Short, 08/26/2023, 10:00 AM

## 2023-08-27 DIAGNOSIS — S06339A Contusion and laceration of cerebrum, unspecified, with loss of consciousness of unspecified duration, initial encounter: Secondary | ICD-10-CM | POA: Diagnosis not present

## 2023-08-27 MED ORDER — POLYETHYLENE GLYCOL 3350 17 G PO PACK
17.0000 g | PACK | Freq: Every day | ORAL | Status: DC | PRN
Start: 2023-08-27 — End: 2023-09-01
  Administered 2023-08-27 – 2023-08-31 (×3): 17 g via ORAL
  Filled 2023-08-27 (×3): qty 1

## 2023-08-27 NOTE — Progress Notes (Signed)
 Occupational Therapy Session Note  Patient Details  Name: Erik Santana. MRN: [id number] Date of Birth: [birth year]  {CHL IP REHAB OT TIME CALCULATIONS:[id number]}   Short Term Goals: Week 1:  OT Short Term Goal 1 (Week 1): Pt will use left UE as gross stabilization in grooming task OT Short Term Goal 2 (Week 1): Pt will be given instructions on self ROM to do in bed with left UE OT Short Term Goal 3 (Week 1): Pt will transfer with mod A to toilet  Skilled Therapeutic Interventions/Progress Updates:    Patient agreeable to participate in OT session. Reports *** pain level.   Patient participated in skilled OT session focusing on ***. Therapist facilitated/assessed/developed/educated/integrated/elicited *** in order to improve/facilitate/promote    Therapy Documentation Precautions:  Precautions Precautions: Fall Precaution/Restrictions Comments: left LE tone Restrictions Weight Bearing Restrictions Per Provider Order: No   Therapy/Group: Individual Therapy  Limmie Patricia, OTR/L,CBIS  Supplemental OT - [facility] and [facility] Secure Chat Preferred   08/27/2023, 5:11 PM

## 2023-08-27 NOTE — Plan of Care (Signed)
  Problem: Consults Goal: RH STROKE PATIENT EDUCATION Description: See Patient Education module for education specifics  Outcome: Progressing   Problem: RH BOWEL ELIMINATION Goal: RH STG MANAGE BOWEL WITH ASSISTANCE Description: STG Manage Bowel with mod I Assistance. Outcome: Progressing   Problem: RH SAFETY Goal: RH STG ADHERE TO SAFETY PRECAUTIONS W/ASSISTANCE/DEVICE Description: STG Adhere to Safety Precautions With cues Assistance/Device. Outcome: Progressing   Problem: RH PAIN MANAGEMENT Goal: RH STG PAIN MANAGED AT OR BELOW PT'S PAIN GOAL Description: < 4 with prns Outcome: Progressing

## 2023-08-27 NOTE — Progress Notes (Signed)
 Patient refused 2200 lovenox shot. Patient states it causes "pain in legs and stomach". Patient would like to discuss other alternatives in the morning with MD. This nurse will pass along to day shift nurse in the morning. Patient currently in bed with mother at bedside. Bell alarm on and call bell within reach.

## 2023-08-27 NOTE — Progress Notes (Signed)
 PROGRESS NOTE   Subjective/Complaints: EKG shows incomplete bundle branch block, cardiology consulted, discussed with nursing and EKG was ordered due to bradycardia  ROS: Denies fevers, chills, N/V, abdominal pain, constipation, diarrhea, SOB, cough, chest pain, new weakness or paraesthesias.   + insomnia--improved + vertigo--ongoing, LLQ + Anxiety --ongoing + LUE and LLE spasticity  Objective:   No results found. Recent Labs    08/25/23 0603  WBC 8.0  HGB 16.7  HCT 46.8  PLT 333   Recent Labs    08/25/23 0603  NA 138  K 3.4*  CL 102  CO2 24  GLUCOSE 100*  BUN 8  CREATININE 0.92  CALCIUM 9.5    Intake/Output Summary (Last 24 hours) at 08/27/2023 1532 Last data filed at 08/27/2023 0657 Gross per 24 hour  Intake --  Output 150 ml  Net -150 ml        Physical Exam: Vital Signs Blood pressure 136/81, pulse 68, temperature 97.8 F (36.6 C), resp. rate 18, height 6\' 2"  (1.88 m), weight 76.8 kg, SpO2 97%.  Constitutional: No apparent distress. Appropriate appearance for age.  Sitting up in bed. HENT: No JVD. Neck Supple. Trachea midline. Atraumatic, normocephalic. Eyes: PERRLA. EOMI. Visual fields grossly intact.  + Vertiginous symptoms with leftward gaze, horizontal and vertical saccades, not associated with headache or vision deficits.  Mild horizontal nystagmus.--ongoing  Cardiovascular: RRR, no murmurs/rub/gallops. No Edema. Peripheral pulses 2+  Respiratory: CTAB. No rales, rhonchi, or wheezing. On RA.  Abdomen: + bowel sounds, normoactive. No distention or tenderness.  Skin: C/D/I. No apparent lesions.  Peripheral IV intact.   MSK:      No apparent deformity.  Some apparent atrophy of left wrist extensor compartment.        Neurologic exam:  Cognition: AAO to person, place, time and event.  Language: Fluent, No substitutions or neoglisms. No dysarthria. Names 3/3 objects correctly.  Memory: No  apparent deficits Insight: Fair insight into current condition.  Mood: Anxious mood, elevated but appropriate--improved from yesterday Sensation: Equal and intact in BL UE and Les.  Reflexes: 2+ in R  UE and LE; left lower extremity clonus that extinguishes with heel stretch.  CN: 2-12 grossly intact.  Coordination: No apparent tremors. No ataxia on FTN, HTS on R Spasticity:  Left upper extremity: MAS 1+ shoulder abductors, elbow flexors, elbow extensors; MAS 3 wrist flexors; MAS 3 finger flexors, MAS 1 thumb adductors--improved  Left lower extremity: MAS 2 hip flexors, MAS 2 knee extensors, MAS 1 knee flexors, MAS 2 plantar flexors--improved       Strength:                LUE: 0/5 SA, 1/5 EF, 2-/5 EE, 0/5 WE, 1/5 FF, 0/5 FA                RUE:  5/5 SA, 5/5 EF, 5/5 EE, 5/5 WE, 5/5 FF, 5/5 FA                LLE: 3/5 HF, 3/5 KE, 0/5  DF, 0/5  EHL, 1+/5  PF                 RLE:  5/5 HF, 5/5 KE, 5/5  DF, 5/5  EHL, 5/5  PF  stable 3/1     Assessment/Plan: 1. Functional deficits which require 3+ hours per day of interdisciplinary therapy in a comprehensive inpatient rehab setting. Physiatrist is providing close team supervision and 24 hour management of active medical problems listed below. Physiatrist and rehab team continue to assess barriers to discharge/monitor patient progress toward functional and medical goals  Care Tool:  Bathing    Body parts bathed by patient: Left arm, Chest, Abdomen, Front perineal area, Right upper leg, Left upper leg, Face   Body parts bathed by helper: Buttocks, Right lower leg, Left lower leg     Bathing assist Assist Level: Moderate Assistance - Patient 50 - 74%     Upper Body Dressing/Undressing Upper body dressing   What is the patient wearing?: Pull over shirt    Upper body assist Assist Level: Moderate Assistance - Patient 50 - 74%    Lower Body Dressing/Undressing Lower body dressing      What is the patient wearing?: Pants     Lower  body assist Assist for lower body dressing: 2 Helpers     Toileting Toileting    Toileting assist Assist for toileting: Maximal Assistance - Patient 25 - 49%     Transfers Chair/bed transfer  Transfers assist     Chair/bed transfer assist level: Maximal Assistance - Patient 25 - 49%     Locomotion Ambulation   Ambulation assist      Assist level: 2 helpers Assistive device:  (Rt hand rail) Max distance: 5'   Walk 10 feet activity   Assist  Walk 10 feet activity did not occur: Safety/medical concerns (Fatigue)        Walk 50 feet activity   Assist Walk 50 feet with 2 turns activity did not occur: Safety/medical concerns         Walk 150 feet activity   Assist Walk 150 feet activity did not occur: Safety/medical concerns         Walk 10 feet on uneven surface  activity   Assist Walk 10 feet on uneven surfaces activity did not occur: Safety/medical concerns         Wheelchair     Assist Is the patient using a wheelchair?: Yes Type of Wheelchair: Manual    Wheelchair assist level: Dependent - Patient 0% Max wheelchair distance: 150'    Wheelchair 50 feet with 2 turns activity    Assist        Assist Level: Dependent - Patient 0%   Wheelchair 150 feet activity     Assist      Assist Level: Dependent - Patient 0%   Blood pressure 136/81, pulse 68, temperature 97.8 F (36.6 C), resp. rate 18, height 6\' 2"  (1.88 m), weight 76.8 kg, SpO2 97%.   Medical Problem List and Plan: 1. 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  Antithrombotics: -DVT/anticoagulation:  Pharmaceutical: Lovenox             -antiplatelet therapy: N/A   3. Pain Management:    - 2/27: Tylenol 350 to 650 mg  every 6 hours for mild to  moderate pain, tramadol 50 mg every 6 hours as needed for severe pain.  Has minimal complaints of pain so far, would consider gabapentin if additional needed.  4. Mood/Behavior/Sleep: Provide emotional support             -antipsychotic agents: N/A  -2-27: Was using Xanax at home for sleep.  Has Atarax on board, add as needed melatonin and scheduled trazodone 100 mg nightly.  Will get sleep log.  2-28: Sleep improved with trazodone and as needed melatonin; continue.   -Could consider addition of BuSpar 5 mg twice daily   5. Neuropsych/cognition: This patient is capable of making decisions on his own behalf.   - reporting severe ongoing anxiety with staff, recommending neuropsychiatry and behavioral assessment as outpatient 6. Skin/Wound Care: Routine skin checks   7. Fluids/Electrolytes/Nutrition: Routine in and outs with follow-up chemistries   -Potassium slightly low 3.4, otherwise labs stable.  8.  Tobacco use.  Continue [name]- consider discussion of decrease in dose given elevated blood pressure.  Provide counseling   9.  History of polysubstance use.  Urine drug screen positive marijuana.  Patient does endorse using Ativan and Percocet off the streets.  Provide counseling   10.  Constipation/diarrhea.  Continue Senokot S1 tablet twice daily.   -Multiple liquid stools overnight, large; DC Senokot  Improved  11.  Spasticity/myoclonus.  -Ordered left WHO today, may need custom per OT  -Increase baclofen from 10 mg twice daily to 10 mg 3 times daily; continue Zanaflex 2 mg every 8 hours as needed for spasms  -Monitor with therapy; would consider addition of antiepileptic if significant myoclonus  -Discussed with patient this a.m., may need Botox exam and injection while in inpatient rehab for left finger flexor tightness, will monitor 1 week on medication prior to pursuing  -2-28: Tone improved with increased baclofen; per therapy reports, ongoing issues with LLE myoclonus,  could benefit from Depakote for this and ongoing mood/behavior difficulties, but will hold off to see if any continued improvement over the weekend on current medications.  12. Bradycardia - episodes of high 40s/low 50s intermittent throughout hospitalization  - EKG today NSR 60 bpm, with QTC 440   - No obvious medication contributors - very rare s/e to tramadol  - If  symptomatic, could consider cardiology consult and holter monitor/ziopatch HR reviewed and appears to have resolved  13. Incomplete bundle branch block: cardiology consulted  LOS: 3 days A FACE TO FACE EVALUATION WAS PERFORMED  Drema Pry Adelard Sanon 08/27/2023, 3:32 PM

## 2023-08-28 DIAGNOSIS — S06339A Contusion and laceration of cerebrum, unspecified, with loss of consciousness of unspecified duration, initial encounter: Secondary | ICD-10-CM | POA: Diagnosis not present

## 2023-08-28 MED ORDER — ALUM & MAG HYDROXIDE-SIMETH 200-200-20 MG/5ML PO SUSP
15.0000 mL | ORAL | Status: DC | PRN
  Administered 2023-08-28 – 2023-09-20 (×12): 15 mL via ORAL
  Filled 2023-08-28 (×13): qty 30

## 2023-08-28 MED ORDER — FLUTICASONE PROPIONATE 50 MCG/ACT NA SUSP
1.0000 | Freq: Every day | NASAL | Status: DC
  Administered 2023-08-29 – 2023-09-22 (×24): 1 via NASAL
  Filled 2023-08-28: qty 16

## 2023-08-28 NOTE — Progress Notes (Addendum)
 Physical Therapy Session Note  Patient Details  Name: Erik Santana. MRN: [id number] Date of Birth: 03-10-1991  Today's Date: 08/28/2023 PT Individual Time: 0902-1000 PT Individual Time Calculation (min): 58 min   Short Term Goals: Week 1:  PT Short Term Goal 1 (Week 1): Pt will perform bed mobility with modA. PT Short Term Goal 2 (Week 1): Pt will perform sit to stand with modA consistently. PT Short Term Goal 3 (Week 1): Pt will perform bed to chair with modA consistently. PT Short Term Goal 4 (Week 1): Pt will ambulate x25' with modA and LRAD.  Skilled Therapeutic Interventions/Progress Updates: Pt presents semi-reclined and agreeable to therapy.  PT performed HC stretch L ankle and educated pt and family on PRAFO use for spasticity and heel protection. Pt asking about footwear and suggested tennis shoes for BOS.   Pt transferred sup to sit hooking L LE w/ side rail and max A.  Pt sitting at EOB w/ clonus to L LE, reduced but continues.  Pt performed sit to stand w/ max A and performs SPT w/ max A.  Pt wheeled to railing outside main gym.  Pt performed sit to stand w/ max A, w/ blocking of L LE from hyperextension.  Pt performed standing w/ equal WB to B LES.  Pt performed marching RLE only for increased NMR to L.  PROM to LUE including fingers w/ statements of comfort , except soreness to L triceps w/ horizontal abd and ER.  Pt then transferred sit to stand w/ mod A w/ cues for forward scoot/lean.  Pt then stood w/ RW for WB through LUE, good grip strength.  Pt returned to room and remained sitting in TIS, upright w/ chair alarm and seat belt on, all needs in reach, mother present.     Therapy Documentation Precautions:  Precautions Precautions: Fall Precaution/Restrictions Comments: left LE tone Restrictions Weight Bearing Restrictions Per Provider Order: No General:   Vital Signs:   Pain: unrated "little bit LUE/LE"      Therapy/Group: Individual Therapy  Lucio Edward 08/28/2023, 10:04 AM

## 2023-08-28 NOTE — Progress Notes (Signed)
 Occupational Therapy Session Note  Patient Details  Name: Erik Santana. MRN: [id number] Date of Birth: September 27, 1990  Today's Date: 08/28/2023 OT Individual Time: 1400-1500 OT Individual Time Calculation (min): 60 min    Short Term Goals: Week 1:  OT Short Term Goal 1 (Week 1): Pt will use left UE as gross stabilization in grooming task OT Short Term Goal 2 (Week 1): Pt will be given instructions on self ROM to do in bed with left UE OT Short Term Goal 3 (Week 1): Pt will transfer with mod A to toilet  Skilled Therapeutic Interventions/Progress Updates:      Therapy Documentation Precautions:  Precautions Precautions: Fall Precaution/Restrictions Comments: left LE tone Restrictions Weight Bearing Restrictions Per Provider Order: No General: "Thank you for helping me!" Pt seated in W/C upon OT arrival, agreeable to OT.  Pain: no pain reported  ADL: Pt completing bed mobility at CGA using hooking method to manage LLE. Pt completed initial stand pivot W/C with Lt knee block in order to manage extensor tone at Min-Mod A. Pt at end of session completed squat pivot using bed rail W/C>bed with Lt knee block at CGA. Transfers to Rt side during session.  NMRE: OT stretching LUE before activity for decreased tone overall in LUE, using vibration method with decreased tone noted. Saebo-one stim applied to LUE wrist extensors in order to decrease tone and increase functional use of LUE. OT issued palm protector for increased flexor tone in Lt hand. OT recommended pt to wear resting hand splint when not in therapy in order to prevent contractures d/t tone.   Other Treatments: OT re-iterated education on self-ROM and provided handout on hand/wrist stretched for pt or caregiver to complete for tone/contracture management. OT completed W/C management for pt for increased positioning in BLE in order to manage tone.    Pt supine in bed with bed alarm activated, 2 bed rails up, call light within  reach and 4Ps assessed. Resting hand splint for LUE donned and pillow underneath Lt knee for extensor tone management with PRAFO donned.   Therapy/Group: Individual Therapy [name], OTD, OTR/L 08/28/2023, 5:14 PM

## 2023-08-28 NOTE — Plan of Care (Signed)
  Problem: RH BOWEL ELIMINATION Goal: RH STG MANAGE BOWEL W/MEDICATION W/ASSISTANCE Description: STG Manage Bowel with Medication with  mod I Assistance. Outcome: Progressing   Problem: RH SAFETY Goal: RH STG ADHERE TO SAFETY PRECAUTIONS W/ASSISTANCE/DEVICE Description: STG Adhere to Safety Precautions With cues Assistance/Device. Outcome: Progressing   Problem: RH PAIN MANAGEMENT Goal: RH STG PAIN MANAGED AT OR BELOW PT'S PAIN GOAL Description: < 4 with prns Outcome: Progressing   Problem: RH KNOWLEDGE DEFICIT Goal: RH STG INCREASE KNOWLEDGE OF HYPERTENSION Description: Patient and family will be able to manage HTN with medications and educational resources for dietary modification, lifestyle modification independently. Outcome: Progressing

## 2023-08-28 NOTE — Progress Notes (Signed)
 Pt reporting increased sensation of dizzziness, blurred vision, and double vision at this time. Reports impaired peripheral vision with left eye. Difficulty to keep eyes open when looking downward left and right. Difficulty focusing on objects in front of him and close to him. Reports shortness of breath. Explained to Provider and Provider made aware.

## 2023-08-28 NOTE — Progress Notes (Signed)
 PROGRESS NOTE   Subjective/Complaints: Discussed EKG results, cardiology consult, bradycardia at night Had been using home nasal spray, asked nursing to discuss this can increase stroke risk  ROS: Denies fevers, chills, N/V, abdominal pain, constipation, diarrhea, SOB, cough, chest pain, new weakness or paraesthesias.   + insomnia--improved + vertigo--ongoing, LLQ + Anxiety --ongoing + LUE and LLE spasticity  Objective:   No results found. No results for input(s): "WBC", "HGB", "HCT", "PLT" in the last 72 hours.  No results for input(s): "NA", "K", "CL", "CO2", "GLUCOSE", "BUN", "CREATININE", "CALCIUM" in the last 72 hours.   Intake/Output Summary (Last 24 hours) at 08/28/2023 1312 Last data filed at 08/28/2023 0345 Gross per 24 hour  Intake 556 ml  Output 1475 ml  Net -919 ml        Physical Exam: Vital Signs Blood pressure 138/87, pulse 75, temperature 97.7 F (36.5 C), resp. rate 17, height 6\' 2"  (1.88 m), weight 76.8 kg, SpO2 98%.  Constitutional: No apparent distress. Appropriate appearance for age.  Sitting up in bed. HENT: No JVD. Neck Supple. Trachea midline. Atraumatic, normocephalic. +nasal congestion Eyes: PERRLA. EOMI. Visual fields grossly intact.  + Vertiginous symptoms with leftward gaze, horizontal and vertical saccades, not associated with headache or vision deficits.  Mild horizontal nystagmus.--ongoing  Cardiovascular: RRR, no murmurs/rub/gallops. No Edema. Peripheral pulses 2+  Respiratory: CTAB. No rales, rhonchi, or wheezing. On RA.  Abdomen: + bowel sounds, normoactive. No distention or tenderness.  Skin: C/D/I. No apparent lesions.  Peripheral IV intact.   MSK:      No apparent deformity.  Some apparent atrophy of left wrist extensor compartment.        Neurologic exam:  Cognition: AAO to person, place, time and event.  Language: Fluent, No substitutions or neoglisms. No dysarthria. Names  3/3 objects correctly.  Memory: No apparent deficits Insight: Fair insight into current condition.  Mood: Anxious mood, elevated but appropriate--improved from yesterday Sensation: Equal and intact in BL UE and Les.  Reflexes: 2+ in R  UE and LE; left lower extremity clonus that extinguishes with heel stretch.  CN: 2-12 grossly intact.  Coordination: No apparent tremors. No ataxia on FTN, HTS on R Spasticity:  Left upper extremity: MAS 1+ shoulder abductors, elbow flexors, elbow extensors; MAS 3 wrist flexors; MAS 3 finger flexors, MAS 1 thumb adductors--improved  Left lower extremity: MAS 2 hip flexors, MAS 2 knee extensors, MAS 1 knee flexors, MAS 2 plantar flexors--improved       Strength:                LUE: 0/5 SA, 1/5 EF, 2-/5 EE, 0/5 WE, 1/5 FF, 0/5 FA                RUE:  5/5 SA, 5/5 EF, 5/5 EE, 5/5 WE, 5/5 FF, 5/5 FA                LLE: 3/5 HF, 3/5 KE, 0/5  DF, 0/5  EHL, 1+/5  PF                 RLE:  5/5 HF, 5/5 KE, 5/5  DF, 5/5  EHL, 5/5  PF  stable 3/1     Assessment/Plan: 1. Functional deficits which require 3+ hours per day of interdisciplinary therapy in a comprehensive inpatient rehab setting. Physiatrist is providing close team supervision and 24 hour management of active medical problems listed below. Physiatrist and rehab team continue to assess barriers to discharge/monitor patient progress toward functional and medical goals  Care Tool:  Bathing    Body parts bathed by patient: Left arm, Chest, Abdomen, Front perineal area, Right upper leg, Left upper leg, Face   Body parts bathed by helper: Buttocks, Right lower leg, Left lower leg     Bathing assist Assist Level: Moderate Assistance - Patient 50 - 74%     Upper Body Dressing/Undressing Upper body dressing   What is the patient wearing?: Pull over shirt    Upper body assist Assist Level: Moderate Assistance - Patient 50 - 74%    Lower Body Dressing/Undressing Lower body dressing      What is the  patient wearing?: Pants     Lower body assist Assist for lower body dressing: 2 Helpers     Toileting Toileting    Toileting assist Assist for toileting: Maximal Assistance - Patient 25 - 49%     Transfers Chair/bed transfer  Transfers assist     Chair/bed transfer assist level: Maximal Assistance - Patient 25 - 49%     Locomotion Ambulation   Ambulation assist      Assist level: 2 helpers Assistive device:  (Rt hand rail) Max distance: 5'   Walk 10 feet activity   Assist  Walk 10 feet activity did not occur: Safety/medical concerns (Fatigue)        Walk 50 feet activity   Assist Walk 50 feet with 2 turns activity did not occur: Safety/medical concerns         Walk 150 feet activity   Assist Walk 150 feet activity did not occur: Safety/medical concerns         Walk 10 feet on uneven surface  activity   Assist Walk 10 feet on uneven surfaces activity did not occur: Safety/medical concerns         Wheelchair     Assist Is the patient using a wheelchair?: Yes Type of Wheelchair: Manual    Wheelchair assist level: Dependent - Patient 0% Max wheelchair distance: 150'    Wheelchair 50 feet with 2 turns activity    Assist        Assist Level: Dependent - Patient 0%   Wheelchair 150 feet activity     Assist      Assist Level: Dependent - Patient 0%   Blood pressure 138/87, pulse 75, temperature 97.7 F (36.5 C), resp. rate 17, height 6\' 2"  (1.88 m), weight 76.8 kg, SpO2 98%.   Medical Problem List and Plan: 1. 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  Antithrombotics: -DVT/anticoagulation:  Pharmaceutical: Lovenox             -antiplatelet therapy: N/A   3. Pain Management:    - 2/27: Tylenol 350  to 650 mg every 6 hours for mild to moderate pain, tramadol 50 mg every 6  hours as needed for severe pain.  Has minimal complaints of pain so far, would consider gabapentin if additional needed.  4. Mood/Behavior/Sleep: Provide emotional support             -antipsychotic agents: N/A  -2-27: Was using Xanax at home for sleep.  Has Atarax on board, add as needed melatonin and scheduled trazodone 100 mg nightly.  Will get sleep log.  2-28: Sleep improved with trazodone and as needed melatonin; continue.   -Could consider addition of BuSpar 5 mg twice daily   5. Neuropsych/cognition: This patient is capable of making decisions on his own behalf.   - reporting severe ongoing anxiety with staff, recommending neuropsychiatry and behavioral assessment as outpatient 6. Skin/Wound Care: Routine skin checks   7. Fluids/Electrolytes/Nutrition: Routine in and outs with follow-up chemistries   -Potassium slightly low 3.4, otherwise labs stable.  8.  Tobacco use.  Continue [name]- consider discussion of decrease in dose given elevated blood pressure.  Provide counseling   9.  History of polysubstance use.  Urine drug screen positive marijuana.  Patient does endorse using Ativan and Percocet off the streets.  Provide counseling   10.  Constipation/diarrhea.  Continue Senokot S1 tablet twice daily.   -Multiple liquid stools overnight, large; DC Senokot  Improved  11.  Spasticity/myoclonus.  -Ordered left WHO today, may need custom per OT  -Increase baclofen from 10 mg twice daily to 10 mg 3 times daily; continue Zanaflex 2 mg every 8 hours as needed for spasms  -Monitor with therapy; would consider addition of antiepileptic if significant myoclonus  -Discussed with patient this a.m., may need Botox exam and injection while in inpatient rehab for left finger flexor tightness, will monitor 1 week on medication prior to pursuing  -2-28: Tone improved with increased baclofen; per therapy reports,  ongoing issues with LLE myoclonus, could benefit from Depakote for this and ongoing mood/behavior difficulties, but will hold off to see if any continued improvement over the weekend on current medications.  3/2: asked Trey Paula PT if longer foot rest can be applied to wheelchair as patient experiencing clonus with current and feels it is too small  12. Bradycardia - episodes of high 40s/low 50s intermittent throughout hospitalization  - EKG today NSR 60 bpm, with QTC 440   - No obvious medication contributors - very rare s/e to tramadol  - If  symptomatic, could consider cardiology consult and holter monitor/ziopatch HR reviewed and appears to have resolved, discussed that lower heart rate at night is physiological  13. Incomplete bundle branch block: cardiology consulted, discussed with patient that no changes recommended  14. Nasal congestion: asked nursing to advise patient not to use home nasal spray as it is a vasoconstrictor and can increase stroke risk  LOS: 4 days A FACE TO FACE EVALUATION WAS PERFORMED  Barlow Harrison P Jahnay Lantier 08/28/2023, 1:12 PM

## 2023-08-29 DIAGNOSIS — I612 Nontraumatic intracerebral hemorrhage in hemisphere, unspecified: Secondary | ICD-10-CM | POA: Diagnosis not present

## 2023-08-29 LAB — BASIC METABOLIC PANEL
Anion gap: 12 (ref 5–15)
BUN: 10 mg/dL (ref 6–20)
CO2: 27 mmol/L (ref 22–32)
Calcium: 9.9 mg/dL (ref 8.9–10.3)
Chloride: 96 mmol/L — ABNORMAL LOW (ref 98–111)
Creatinine, Ser: 0.85 mg/dL (ref 0.61–1.24)
GFR, Estimated: >60 mL/min (ref >60)
Glucose, Bld: 92 mg/dL (ref 70–99)
Potassium: 4 mmol/L (ref 3.5–5.1)
Sodium: 135 mmol/L (ref 135–145)

## 2023-08-29 MED ORDER — LIDOCAINE HCL URETHRAL/MUCOSAL 2 % EX GEL: CUTANEOUS | Status: DC | PRN

## 2023-08-29 MED ORDER — BUSPIRONE HCL 10 MG PO TABS
5.0000 mg | ORAL_TABLET | Freq: Three times a day (TID) | ORAL | Status: DC
  Administered 2023-08-29 – 2023-09-02 (×14): 5 mg via ORAL
  Filled 2023-08-29 (×14): qty 1

## 2023-08-29 MED ORDER — BISACODYL 10 MG RE SUPP: 10.0000 mg | Freq: Every day | RECTAL | Status: DC | PRN

## 2023-08-29 MED ORDER — FLEET ENEMA RE ENEM: 1.0000 | ENEMA | Freq: Every day | RECTAL | Status: DC | PRN

## 2023-08-29 MED ORDER — TIZANIDINE HCL 4 MG PO TABS
2.0000 mg | ORAL_TABLET | Freq: Three times a day (TID) | ORAL | Status: DC
  Administered 2023-08-29 – 2023-09-02 (×14): 2 mg via ORAL
  Filled 2023-08-29 (×14): qty 1

## 2023-08-29 NOTE — Plan of Care (Signed)
  Problem: Consults Goal: RH STROKE PATIENT EDUCATION Description: See Patient Education module for education specifics  Outcome: Progressing   Problem: RH BOWEL ELIMINATION Goal: RH STG MANAGE BOWEL WITH ASSISTANCE Description: STG Manage Bowel with mod I Assistance. Outcome: Progressing Goal: RH STG MANAGE BOWEL W/MEDICATION W/ASSISTANCE Description: STG Manage Bowel with Medication with  mod I Assistance. Outcome: Progressing   Problem: RH SAFETY Goal: RH STG ADHERE TO SAFETY PRECAUTIONS W/ASSISTANCE/DEVICE Description: STG Adhere to Safety Precautions With cues Assistance/Device. Outcome: Progressing   Problem: RH PAIN MANAGEMENT Goal: RH STG PAIN MANAGED AT OR BELOW PT'S PAIN GOAL Description: < 4 with prns Outcome: Progressing   Problem: RH KNOWLEDGE DEFICIT Goal: RH STG INCREASE KNOWLEDGE OF HYPERTENSION Description: Patient and family will be able to manage HTN with medications and educational resources for dietary modification, lifestyle modification independently. Outcome: Progressing Goal: RH STG INCREASE KNOWLEGDE OF HYPERLIPIDEMIA Description: Patient and family will be able to manage HLD with medications and educational resources for dietary modification, lifestyle modification independently. Outcome: Progressing Goal: RH STG INCREASE KNOWLEDGE OF STROKE PROPHYLAXIS Description: Patient and family will be able to manage secondary risks with medications and educational resources for dietary modification, lifestyle modification independently. Outcome: Progressing

## 2023-08-29 NOTE — Progress Notes (Signed)
Pt placed on overnight pulse ox. 

## 2023-08-29 NOTE — Progress Notes (Signed)
 Patient ID: Erik Santana., male   DOB: 1991-06-20, 33 y.o.   MRN: [id number]  Received update reporting pt new patient appt changed to 3/11 @10am  at [facility]. SW spoke with Albin Felling  to reschedule appt as pt will still be in the hospital. Pt rescheduled for Tuesday, May 14 at 1020am with NP-Amy Zonia Kief; arrive at 1005.  Cecile Sheerer, MSW, LCSW Office: [phone] Cell: [phone] Fax: [phone]

## 2023-08-29 NOTE — Progress Notes (Signed)
 Occupational Therapy Session Note  Patient Details  Name: Erik Santana. MRN: [id number] Date of Birth: 1990-10-11  Session 1 Today's Date: 08/29/2023 OT Individual Time: 0865-7846 OT Individual Time Calculation (min): 63 min    Session 2 Today's Date: 08/29/2023 OT Individual Time: 1520-1550 OT Individual Time Calculation (min): 30 min    Short Term Goals: Week 1:  OT Short Term Goal 1 (Week 1): Pt will use left UE as gross stabilization in grooming task OT Short Term Goal 2 (Week 1): Pt will be given instructions on self ROM to do in bed with left UE OT Short Term Goal 3 (Week 1): Pt will transfer with mod A to toilet  Skilled Therapeutic Interventions/Progress Updates:    Session 1 Pt received supine with no c/o pain, agreeable to OT session. Provided PROM to LUE and LLE as preparatory method. He completed bed mobility to EOB with CGA. He demonstrated improved static sitting balance today, requiring only CGA. He was also excited to show OT improved active finger flexion, ~75% composite flexion. Still with significant tone but much improved from last week. Stand pivot with mod A to the R, to the TIS w/c. Pt was taken via w/c to the therapy gym for time management. Session focused on functional mobility progression, for improved trunk control, LLE activation, functional weight shifting, all for carryover to improved independence with ADLs. First completed 40 ft of 3 musketeers, with mod A +2 for trunk support, as well as heavy assist to advance LLE, as well as posterior support to prevent hyperextension. Following a rest break he completed 120 ft in the LiteGait with BUE on the handles (LUE tone supporting grasp). He required max A to advance the LLE but did demonstrate improvement in weight shift R and was able to initiate hip flexion. He required frequent cueing for upright posture, weight shift, sequencing, and pacing. He returned to his room following. Pt was left sitting up in the w/c  with all needs met, chair alarm set, and call bell within reach.   Saebo Stim One was applied to his L dorsal wrist to promote wrist and finger extension. He had great response, no c/o pain and no adverse skin reaction. 60 min unattended e stim.  330 pulse width 35 Hz pulse rate On 8 sec/ off 8 sec Ramp up/ down 2 sec Symmetrical Biphasic wave form  Max intensity at 500 Ohm load    Session 2 Pt received supine with no c/o pain, agreeable to OT session. He came to EOB with (S), self managing LLE. He completed stand pivot transfer to the w/c with mod A, requiring max facilitation of the LLE. Pt was taken via w/c to the therapy gym for time management. Worked on LUE NMR using the saebo mobile arm support. Worked in all planes of movement and through all LUE joints. He had full hand flexion when OT facilitated his fingers into extension. He had visible trace bicep and anterior deltoid. Several repetitions completed to maximize return, as well as cueing required for attention to the L. He returned to his room following. Stand pivot back to bed, min A toward the R. He was left supine with all needs met. Bed alarm set.   Therapy Documentation Precautions:  Precautions Precautions: Fall Precaution/Restrictions Comments: left LE tone Restrictions Weight Bearing Restrictions Per Provider Order: No  Therapy/Group: Individual Therapy  Crissie Reese 08/29/2023, 6:18 AM

## 2023-08-29 NOTE — Progress Notes (Signed)
 Speech Language Pathology Daily Session Note  Patient Details  Name: Erik Santana. MRN: [id number] Date of Birth: 1991/02/11  Today's Date: 08/29/2023 SLP Individual Time: 0930-1015 SLP Individual Time Calculation (min): 45 min  Short Term Goals: Week 1: SLP Short Term Goal 1 (Week 1): Pt will recall recent/relevant info with 90% accuracy and s cues SLP Short Term Goal 2 (Week 1): Pt will adequately process mildly complex information during cognitive tasks w/ s cues SLP Short Term Goal 3 (Week 1): Pt will complete mildly complex to complex problem solving tasks with s cues  Skilled Therapeutic Interventions:   Pt greeted at bedside. He was awake/alert upon SLP arrival and very pleasant throughout tx tasks targeting cognition. He was able to independently recall recent events and information from the weekend w/ 95% accuracy. He completed a 3 step memory task targeting working memory and [employer] @ modI. Additionally, he completed a written phrase formulation task targeting decoding, problem solving, and alternating attention @ modI. Adequate information processing noted throughout structured as well as conversational tasks. Anticipate pt is nearing his cognitive baseline, and will likely only require 1-2 more tx sessions to ensure complete return to baseline and completion of pt/family education. Continue ST per POC.    Pain Pain Assessment Pain Scale: 0-10 Pain Score: 8  Pain Location: Back Pain Intervention(s): Pain medication provided prior to SLP tx session see EMR for more info.   Therapy/Group: Individual Therapy  Pati Gallo 08/29/2023, 10:10 AM

## 2023-08-29 NOTE — Progress Notes (Signed)
 PROGRESS NOTE   Subjective/Complaints:  NO events overnight Vitals stable Minimal fluid intakes; Is and Os -1.3 L and -1.6 L last 2 days  Patient endorsing significant nighttime anxiety, thinks that he carries a diagnosis of sleep apnea due to pre-existing episodes where his girlfriend noted he would "lay still and then wake up suddenly" frequently at night.  Thinks this chronically interrupted sleep is feeding into his anxiety and heart problems.  Would like testing done in the hospital if possible.  Endorses ongoing difficulty with anxiety, also thinks this is feeding into his vertiginous symptoms, is improved somewhat with meclizine but he is concerned that they are not going away.    Spasticity in left lower extremity especially bothersome when lying in bed/at nighttime.  Says baclofen and tizanidine are helping quite a bit.  No ongoing side effects to either medication.  Patient endorses using saline spray for nasal congestion, mother had just brought in Afrin a few days ago.  He is no longer using it.  ROS: Denies fevers, chills, N/V, abdominal pain, constipation, diarrhea, SOB, cough, chest pain, new weakness or paraesthesias.   + insomnia--ongoing + vertigo--ongoing, LLQ + Anxiety --ongoing + LUE and LLE spasticity--slightly improved  Objective:   No results found. No results for input(s): "WBC", "HGB", "HCT", "PLT" in the last 72 hours.  No results for input(s): "NA", "K", "CL", "CO2", "GLUCOSE", "BUN", "CREATININE", "CALCIUM" in the last 72 hours.   Intake/Output Summary (Last 24 hours) at 08/29/2023 0812 Last data filed at 08/29/2023 2440 Gross per 24 hour  Intake 708 ml  Output 1825 ml  Net -1117 ml        Physical Exam: Vital Signs Blood pressure 125/84, pulse 72, temperature 98 F (36.7 C), resp. rate 18, height 6\' 2"  (1.88 m), weight 76.8 kg, SpO2 99%.  Constitutional: No apparent distress. Appropriate  appearance for age.  Laying in bed. HENT: No JVD. Neck Supple. Trachea midline. Atraumatic, normocephalic.  Eyes: PERRLA. EOMI. Visual fields grossly intact.  + Vertiginous symptoms, Mild horizontal nystagmus.--Worse when looking to the right inferior quadrant  Cardiovascular: RRR, no murmurs/rub/gallops. No Edema. Peripheral pulses 2+  Respiratory: CTAB. No rales, rhonchi, or wheezing. On RA.  Abdomen: + bowel sounds, normoactive. No distention or tenderness.  Skin: C/D/I. No apparent lesions.    MSK:      No apparent deformity.  Some apparent atrophy of left wrist extensor compartment.  Plantarflexion tone of the left lower extremity.      Neurologic exam:  Cognition: AAO to person, place, time and event.  No apparent cognitive deficits. Insight: Good insight into current condition.  Mood: Anxious mood, elevated but appropriate Sensation: Equal and intact in BL UE and Les.  Reflexes: 2+ in R  UE and LE; left lower extremity clonus that extinguishes with heel stretch.  CN: 2-12 grossly intact.  Coordination: No apparent tremors. No ataxia on FTN, HTS on R Spasticity:  Left upper extremity: MAS 1+ shoulder abductors, elbow flexors, elbow extensors; MAS 3 wrist flexors; MAS 3 finger flexors, MAS 1 thumb adductors--ongoing  Left lower extremity: MAS 2 hip flexors, MAS 2 knee extensors, MAS 1 knee flexors, MAS 2 plantar flexors--ongoing  Strength:                LUE: 0/5 SA, 1/5 EF, 2-/5 EE, 0/5 WE, 1/5 FF, 0/5 FA                RUE:  5/5 SA, 5/5 EF, 5/5 EE, 5/5 WE, 5/5 FF, 5/5 FA                LLE: 3/5 HF, 3/5 KE, 0/5  DF, 0/5  EHL, 1+/5  PF                 RLE:  5/5 HF, 5/5 KE, 5/5  DF, 5/5  EHL, 5/5  PF    Unchanged 2-3     Assessment/Plan: 1. Functional deficits which require 3+ hours per day of interdisciplinary therapy in a comprehensive inpatient rehab setting. Physiatrist is providing close team supervision and 24 hour management of active medical problems listed  below. Physiatrist and rehab team continue to assess barriers to discharge/monitor patient progress toward functional and medical goals  Care Tool:  Bathing    Body parts bathed by patient: Left arm, Chest, Abdomen, Front perineal area, Right upper leg, Left upper leg, Face   Body parts bathed by helper: Buttocks, Right lower leg, Left lower leg     Bathing assist Assist Level: Moderate Assistance - Patient 50 - 74%     Upper Body Dressing/Undressing Upper body dressing   What is the patient wearing?: Pull over shirt    Upper body assist Assist Level: Moderate Assistance - Patient 50 - 74%    Lower Body Dressing/Undressing Lower body dressing      What is the patient wearing?: Pants     Lower body assist Assist for lower body dressing: 2 Helpers     Toileting Toileting    Toileting assist Assist for toileting: Maximal Assistance - Patient 25 - 49%     Transfers Chair/bed transfer  Transfers assist     Chair/bed transfer assist level: Maximal Assistance - Patient 25 - 49%     Locomotion Ambulation   Ambulation assist      Assist level: 2 helpers Assistive device:  (Rt hand rail) Max distance: 5'   Walk 10 feet activity   Assist  Walk 10 feet activity did not occur: Safety/medical concerns (Fatigue)        Walk 50 feet activity   Assist Walk 50 feet with 2 turns activity did not occur: Safety/medical concerns         Walk 150 feet activity   Assist Walk 150 feet activity did not occur: Safety/medical concerns         Walk 10 feet on uneven surface  activity   Assist Walk 10 feet on uneven surfaces activity did not occur: Safety/medical concerns         Wheelchair     Assist Is the patient using a wheelchair?: Yes Type of Wheelchair: Manual    Wheelchair assist level: Dependent - Patient 0% Max wheelchair distance: 150'    Wheelchair 50 feet with 2 turns activity    Assist        Assist Level: Dependent  - Patient 0%   Wheelchair 150 feet activity     Assist      Assist Level: Dependent - Patient 0%   Blood pressure 125/84, pulse 72, temperature 98 F (36.7 C), resp. rate 18, height 6\' 2"  (1.88 m), weight 76.8 kg, SpO2 99%.   Medical Problem List and  Plan: 1. 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  Antithrombotics: -DVT/anticoagulation:  Pharmaceutical: Lovenox             -antiplatelet therapy: N/A   3. Pain Management:    - 2/27: Tylenol 350 to 650 mg every 6 hours for mild to moderate pain, tramadol 50 mg every 6 hours as needed for severe pain.  Has minimal complaints of pain so far, would consider gabapentin if additional needed.  3/3: Using tramadol approximately BID for lower back pain; with benefit.  4. Mood/Behavior/Sleep: Provide emotional support             -antipsychotic agents: N/A  -2-27: Was using Xanax at home for sleep.  Has Atarax on board, add as needed melatonin and scheduled trazodone 100 mg nightly.  Will get sleep log.  2-28: Sleep improved with trazodone and as needed melatonin; continue.  -3/3: addition of BuSpar 5 mg 3 times daily for anxiety   5. Neuropsych/cognition: This patient is capable of making decisions on his own behalf.   - reporting severe ongoing anxiety with staff, recommending neuropsychiatry and behavioral assessment as outpatient  6. Skin/Wound Care: Routine skin checks   7. Fluids/Electrolytes/Nutrition: Routine in and outs with follow-up chemistries   -Potassium slightly low 3.4, otherwise labs stable.  -3-3: AM labs stable  8.  Tobacco use.  Continue [name]- consider discussion of decrease in dose given elevated blood pressure.  Provide counseling   9.  History of polysubstance use.  Urine drug screen  positive marijuana.  Patient does endorse using Ativan and Percocet off the streets.  Provide counseling   10.  Constipation/diarrhea.  Continue Senokot S1 tablet twice daily.   -Multiple liquid stools overnight, large; DC Senokot  Improved  11.  Spasticity/myoclonus.  -Ordered left WHO today, may need custom per OT  -Increase baclofen from 10 mg twice daily to 10 mg 3 times daily; continue Zanaflex 2 mg every 8 hours as needed for spasms  -Monitor with therapy; would consider addition of antiepileptic if significant myoclonus  -Discussed with patient this a.m., may need Botox exam and injection while in inpatient rehab for left finger flexor tightness, will monitor 1 week on medication prior to pursuing  -2-28: Tone improved with increased baclofen; per therapy reports, ongoing issues with LLE myoclonus, could benefit from Depakote for this and ongoing mood/behavior difficulties, but will hold off to see if any continued improvement over the weekend on current medications.  3/2: asked Trey Paula PT if longer foot rest can be applied to wheelchair as patient experiencing clonus with current and feels it is too small  3/3: Using tizanidine and baclofen with benefit  12. Bradycardia/Incomplete bundle branch block - episodes of high 40s/low 50s intermittent throughout hospitalization  - EKG today NSR 60 bpm, with QTC 440   - No obvious medication contributors - very rare s/e to tramadol  - If  symptomatic, could consider cardiology consult and holter monitor/ziopatch HR reviewed and appears to have resolved, discussed that lower heart rate at night is physiological  - 3/2: cardiology consulted, discussed with patient that no changes recommended  14. Nasal  congestion: asked nursing to advise patient not to use home nasal spray as it is a vasoconstrictor and can increase stroke risk  -Using saline nose spray only, can continue  LOS: 5 days A FACE TO FACE EVALUATION WAS PERFORMED  Angelina Sheriff 08/29/2023, 8:12 AM

## 2023-08-29 NOTE — Progress Notes (Signed)
 Physical Therapy Session Note  Patient Details  Name: Erik Santana. MRN: [id number] Date of Birth: [birth year]  Today's Date: 08/29/2023 PT Individual Time: 1302-1400 PT Individual Time Calculation (min): 58 min   Short Term Goals: Week 1:  PT Short Term Goal 1 (Week 1): Pt will perform bed mobility with modA. PT Short Term Goal 2 (Week 1): Pt will perform sit to stand with modA consistently. PT Short Term Goal 3 (Week 1): Pt will perform bed to chair with modA consistently. PT Short Term Goal 4 (Week 1): Pt will ambulate x25' with modA and LRAD.  Skilled Therapeutic Interventions/Progress Updates:     Pt received seated in [facility] and agrees to therapy. No complaint of pain. WC transport to gym for time management. Pt performs sit to stand with minA and cues for hand placement and sequencing. Pt ambulates with Rt handrail, with PT providing modA atn +2 provided for WC follow. PT provides totalA to progress LLE due to very high tone and lack of active hip flexion. PT facilitate Lt lateral weight shifting and preventing knee hyperextension. Pt ambulates x30' prior to rest break. Pt then performs standing activity in parallel bars to encourage WB through Lt hemibody. Pt stands and performs forward and backward steps with only RLE and PT providing stability and blocking of Lt knee, as well as facilitating Lt lateral weight shifting.   Pt performs stand pivot to mat table with modA and cues for sequencing, body mechanics, and positioning. Pt transitions to supine with minA. Pt completes supine exercises to provide NMR through LLE. Pt completes 2x10 bridges in hooklying with PT providing stabilization of LLE and with notable contraction of Lt hip extensors during exercise.  Pt completes 1x10 single leg bridges with tactile cues to facilitate improved contraction. Pt performs supine to sit with modA and cues for sequencing and positioning. Stand pivot from mat>WC>bed with modA and cues for sequencing. Left  supine with alarm intact and all needs within reach.   Therapy Documentation Precautions:  Precautions Precautions: Fall Precaution/Restrictions Comments: left LE tone Restrictions Weight Bearing Restrictions Per Provider Order: No  Therapy/Group: Individual Therapy  [name], PT, DPT 08/29/2023, 3:55 PM

## 2023-08-30 DIAGNOSIS — I612 Nontraumatic intracerebral hemorrhage in hemisphere, unspecified: Secondary | ICD-10-CM | POA: Diagnosis not present

## 2023-08-30 DIAGNOSIS — S06310S Contusion and laceration of right cerebrum without loss of consciousness, sequela: Secondary | ICD-10-CM | POA: Diagnosis not present

## 2023-08-30 DIAGNOSIS — R4189 Other symptoms and signs involving cognitive functions and awareness: Secondary | ICD-10-CM

## 2023-08-30 LAB — CBC WITH DIFFERENTIAL/PLATELET
Abs Immature Granulocytes: 0.02 10*3/uL (ref 0.00–0.07)
Basophils Absolute: 0.1 10*3/uL (ref 0.0–0.1)
Basophils Relative: 1 %
Eosinophils Absolute: 0.7 10*3/uL — ABNORMAL HIGH (ref 0.0–0.5)
Eosinophils Relative: 8 %
HCT: 47.8 % (ref 39.0–52.0)
Hemoglobin: 16.9 g/dL (ref 13.0–17.0)
Immature Granulocytes: 0 %
Lymphocytes Relative: 33 %
Lymphs Abs: 2.7 10*3/uL (ref 0.7–4.0)
MCH: 29 pg (ref 26.0–34.0)
MCHC: 35.4 g/dL (ref 30.0–36.0)
MCV: 82.1 fL (ref 80.0–100.0)
Monocytes Absolute: 0.8 10*3/uL (ref 0.1–1.0)
Monocytes Relative: 10 %
Neutro Abs: 3.9 10*3/uL (ref 1.7–7.7)
Neutrophils Relative %: 48 %
Platelets: 366 10*3/uL (ref 150–400)
RBC: 5.82 MIL/uL — ABNORMAL HIGH (ref 4.22–5.81)
RDW: 12.3 % (ref 11.5–15.5)
WBC: 8.1 10*3/uL (ref 4.0–10.5)
nRBC: 0 % (ref 0.0–0.2)

## 2023-08-30 MED ORDER — DOCUSATE SODIUM 100 MG PO CAPS
100.0000 mg | ORAL_CAPSULE | Freq: Two times a day (BID) | ORAL | Status: DC
  Administered 2023-08-30 – 2023-09-01 (×5): 100 mg via ORAL
  Filled 2023-08-30 (×5): qty 1

## 2023-08-30 NOTE — Progress Notes (Signed)
 PROGRESS NOTE   Subjective/Complaints:  Patient endorsing improved sleep and anxiety with medications. Getrting some L hand strength back. Spasticity limiting but improving. Asking for scheduled bowel meds due to straining  ROS: Denies fevers, chills, N/V, abdominal pain, diarrhea, SOB, cough, chest pain, new weakness or paraesthesias.   + insomnia--improving + vertigo--improving + Anxiety --improved + LUE and LLE spasticity--slightly improved + constipation  Objective:   No results found. Recent Labs    08/30/23 0528  WBC 8.1  HGB 16.9  HCT 47.8  PLT 366    Recent Labs    08/29/23 1114  NA 135  K 4.0  CL 96*  CO2 27  GLUCOSE 92  BUN 10  CREATININE 0.85  CALCIUM 9.9     Intake/Output Summary (Last 24 hours) at 08/30/2023 1019 Last data filed at 08/30/2023 0717 Gross per 24 hour  Intake 456 ml  Output 2175 ml  Net -1719 ml        Physical Exam: Vital Signs Blood pressure (!) 141/90, pulse 78, temperature 98.1 F (36.7 C), temperature source Oral, resp. rate 19, height 6\' 2"  (1.88 m), weight 76.8 kg, SpO2 98%.  Constitutional: No apparent distress. Appropriate appearance for age. Sitting up in bed.  HENT: No JVD. Neck Supple. Trachea midline. Atraumatic, normocephalic.  Eyes: PERRLA. EOMI. Visual fields grossly intact.  + Vertiginous symptoms, Mild horizontal nystagmus.--Worse when looking to the right inferior quadrant-unchanged  Cardiovascular: RRR, no murmurs/rub/gallops. No Edema. Peripheral pulses 2+  Respiratory: CTAB. No rales, rhonchi, or wheezing. On RA.  Abdomen: + bowel sounds, normoactive. No distention or tenderness.  Skin: C/D/I. No apparent lesions.    MSK:      No apparent deformity.  Some apparent atrophy of left wrist extensor compartment.  Plantarflexion tone of the left lower extremity--can range to -5      Neurologic exam:  Cognition: AAO to person, place, time and event.  No  apparent cognitive deficits. Insight: Good insight into current condition.  Mood: Anxious mood, elevated but appropriate Sensation: Equal and intact in BL UE and Les.  Reflexes: 2+ in R  UE and LE; left lower extremity clonus that extinguishes with heel stretch.  CN: 2-12 grossly intact.  Coordination: No apparent tremors. No ataxia on FTN, HTS on R Spasticity:  Left upper extremity: MAS 1+ shoulder abductors, elbow flexors, elbow extensors; MAS 3 wrist flexors; MAS 3 finger flexors, MAS 1 thumb adductors--ongoing  Left lower extremity: MAS 2 hip flexors, MAS 2 knee extensors, MAS 1 knee flexors, MAS 2 plantar flexors--ongoing       Strength:                LUE: 0/5 SA, 2/5 EF, 3/5 EE, 0/5 WE, 2/5 FF, 0/5 FA                RUE:  5/5 SA, 5/5 EF, 5/5 EE, 5/5 WE, 5/5 FF, 5/5 FA                LLE: 3/5 HF, 3/5 KE, 0/5  DF, 0/5  EHL, 1+/5  PF                 RLE:  5/5 HF, 5/5 KE, 5/5  DF, 5/5  EHL, 5/5  PF    Unchanged 3/4     Assessment/Plan: 1. Functional deficits which require 3+ hours per day of interdisciplinary therapy in a comprehensive inpatient rehab setting. Physiatrist is providing close team supervision and 24 hour management of active medical problems listed below. Physiatrist and rehab team continue to assess barriers to discharge/monitor patient progress toward functional and medical goals  Care Tool:  Bathing    Body parts bathed by patient: Left arm, Chest, Abdomen, Front perineal area, Right upper leg, Left upper leg, Face   Body parts bathed by helper: Buttocks, Right lower leg, Left lower leg     Bathing assist Assist Level: Moderate Assistance - Patient 50 - 74%     Upper Body Dressing/Undressing Upper body dressing   What is the patient wearing?: Pull over shirt    Upper body assist Assist Level: Moderate Assistance - Patient 50 - 74%    Lower Body Dressing/Undressing Lower body dressing      What is the patient wearing?: Pants     Lower body  assist Assist for lower body dressing: 2 Helpers     Toileting Toileting    Toileting assist Assist for toileting: Maximal Assistance - Patient 25 - 49%     Transfers Chair/bed transfer  Transfers assist     Chair/bed transfer assist level: Maximal Assistance - Patient 25 - 49%     Locomotion Ambulation   Ambulation assist      Assist level: 2 helpers Assistive device:  (Rt hand rail) Max distance: 5'   Walk 10 feet activity   Assist  Walk 10 feet activity did not occur: Safety/medical concerns (Fatigue)        Walk 50 feet activity   Assist Walk 50 feet with 2 turns activity did not occur: Safety/medical concerns         Walk 150 feet activity   Assist Walk 150 feet activity did not occur: Safety/medical concerns         Walk 10 feet on uneven surface  activity   Assist Walk 10 feet on uneven surfaces activity did not occur: Safety/medical concerns         Wheelchair     Assist Is the patient using a wheelchair?: Yes Type of Wheelchair: Manual    Wheelchair assist level: Dependent - Patient 0% Max wheelchair distance: 150'    Wheelchair 50 feet with 2 turns activity    Assist        Assist Level: Dependent - Patient 0%   Wheelchair 150 feet activity     Assist      Assist Level: Dependent - Patient 0%   Blood pressure (!) 141/90, pulse 78, temperature 98.1 F (36.7 C), temperature source Oral, resp. rate 19, height 6\' 2"  (1.88 m), weight 76.8 kg, SpO2 98%.   Medical Problem List and Plan: 1. 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 Progressing well with therapies.    2.  Antithrombotics: -DVT/anticoagulation:  Pharmaceutical: Lovenox              -  antiplatelet therapy: N/A   3. Pain Management:    - 2/27: Tylenol 350 to 650 mg every 6 hours for mild to moderate pain, tramadol 50 mg every 6 hours as needed for severe pain.  Has minimal complaints of pain so far, would consider gabapentin if additional needed.  3/3: Using tramadol approximately BID for lower back pain; with benefit.  4. Mood/Behavior/Sleep: Provide emotional support             -antipsychotic agents: N/A  -2-27: Was using Xanax at home for sleep.  Has Atarax on board, add as needed melatonin and scheduled trazodone 100 mg nightly.  Will get sleep log.  - 2-28: Sleep improved with trazodone and as needed melatonin; continue.  -3/3: addition of BuSpar 5 mg 3 times daily for anxiety--improving   5. Neuropsych/cognition: This patient is capable of making decisions on his own behalf.   - reporting severe ongoing anxiety with staff, recommending neuropsychiatry and behavioral assessment as outpatient   - improving  6. Skin/Wound Care: Routine skin checks   7. Fluids/Electrolytes/Nutrition: Routine in and outs with follow-up chemistries   -Potassium slightly low 3.4, otherwise labs stable.  -3-3: AM labs stable  8.  Tobacco use.  Continue [name]- consider discussion of decrease in dose given elevated blood pressure.  Provide counseling   9.  History of polysubstance use.  Urine drug screen positive marijuana.  Patient does endorse using Ativan and Percocet off the streets.  Provide counseling   10.  Constipation/diarrhea.  Continue Senokot S1 tablet twice daily.   -Multiple liquid stools overnight, large; DC Senokot  Improved  11.  Spasticity/myoclonus.  -Ordered left WHO today, may need custom per OT  -Increase baclofen from 10 mg twice daily to 10 mg 3 times daily; continue Zanaflex 2 mg every 8 hours as needed for spasms  -Monitor with therapy; would consider addition of antiepileptic if significant myoclonus  -Discussed with patient this a.m., may need  Botox exam and injection while in inpatient rehab for left finger flexor tightness, will monitor 1 week on medication prior to pursuing  -2-28: Tone improved with increased baclofen; per therapy reports, ongoing issues with LLE myoclonus, could benefit from Depakote for this and ongoing mood/behavior difficulties, but will hold off to see if any continued improvement over the weekend on current medications.  3/2: asked Trey Paula PT if longer foot rest can be applied to wheelchair as patient experiencing clonus with current and feels it is too small  3/3: Using tizanidine and baclofen with benefit  12. Bradycardia/Incomplete bundle branch block - episodes of high 40s/low 50s intermittent throughout hospitalization  - EKG today NSR 60 bpm, with QTC 440   - No obvious medication contributors - very rare s/e to tramadol  - If  symptomatic, could consider cardiology consult and holter monitor/ziopatch HR reviewed and appears to have resolved, discussed that lower heart rate at night is physiological  - 3/2: cardiology consulted, discussed with patient that no changes recommended  - 3/4: No bradycardia or spee apnea on overnight pulse ox  14. Nasal congestion: asked nursing to advise patient not to use home nasal spray as it is a vasoconstrictor and can increase stroke risk  -Using saline nose spray only, can continue  15. Dizziness/vertigo   - Scopalamine patch   - Meclizine 25 mg TID PRN   - 3/4: OT doing vestibular eval today--no peripheral vertigo  LOS: 6 days A FACE TO FACE EVALUATION WAS PERFORMED  Angelina Sheriff 08/30/2023,  10:19 AM

## 2023-08-30 NOTE — Patient Care Conference (Signed)
 Inpatient RehabilitationTeam Conference and Plan of Care Update Date: 08/30/2023   Time: 1019 am    Patient Name: Erik Santana.      Medical Record Number: [id number]  Date of Birth: 23-Dec-1990 Sex: Male         Room/Bed: 4W01C/4W01C-01 Payor Info: Payor: /    Admit Date/Time:  08/24/2023  3:07 PM  Primary Diagnosis:  Intraparenchymal hematoma of brain [facility])  Hospital Problems: Principal Problem:   Intraparenchymal hematoma of brain [facility])    Expected Discharge Date: Expected Discharge Date: 09/22/23  Team Members Present: Physician leading conference: Dr. Elijah Birk Social Worker Present: Cecile Sheerer, LCSWA Nurse Present: Konrad Dolores, RN PT Present: Malachi Pro, PT OT Present: Jake Shark, OT SLP Present: Other (comment) Alvera Novel SLP) PPS Coordinator present : Fae Pippin, SLP     Current Status/Progress Goal Weekly Team Focus  Bowel/Bladder   Pt currently continent B/B  LBM 08/29/23   Will remain continent with normal B/B pattern   Assist pt with toileting needs qshift/prn    Swallow/Nutrition/ Hydration               ADL's   Mod A ADLs, mod A transfers, LUE with full composite hand flexion, trace bicep and deltoid, still with flexor and extensor tone at times   (S) to min A   ADLs, transfers, LUE NMR, sitting balance/standing balance    Mobility   modA bed mobility, minA sit to stand, modA stand pivot transfer, modA +2 x30' with Rt hand rail   supervision trasnfers, minA ambulation  Lt hemibody NMR, strengthening, transfers, ambulation    Communication   [facility]            Safety/Cognition/ Behavioral Observations  anticipate pt will d/c from ST 3/4 d/t return to cognitive baseline. Anxiety negatively impacts overall success slightly   modI   pt/family education, discharge from ST    Pain   Denies pain at this time   Will be free from pain   Assess pain qshift/prn    Skin   Skin is currently intact   Will maintain skin  intergrity without breakdown  Assess skin qshift/prn and provide education to prevent breakdown      Discharge Planning:  Pt is uninsured. Medicid application has been submitted. Pt will discharge to home with his mother who will be primary caregiver. Pt mother works first shift; pt sister to help as she works second shift. per mother, sister will stop working and move to 3rd shift if needed. Pt will be setup for [facility] medication assistance program, and charity HH (if appropriate). SW will confirm there are no barriers to discharge.   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 Patient limited by anxiety, pain, spasticity, bradycardia and dizziness.   Patient on target to meet rehab goals: yes, Patient requires moderate assistance with ADLs; Patient requires moderate assistance with stand and pivot transfers and moderate assistance +2 ambulation x 30' using the right hand rail. Overall goals, are set  for supervision to minimal assistance at discharge.  *See Care Plan and progress notes for long and sort-term goals.   Revisions to Treatment Plan:  N/a   Teaching Needs: Safety, transfers, dietary recommendations, polysubstance and tobacco cessation education , toileting, etc.   Current Barriers to Discharge: Decreased caregiver support  Possible Resolutions to Barriers:  Family Education Home health (If appropriate)     Medical Summary Current Status: medically complicated by low back  pain, L hemiparesis, spasticity, myoclonus, anxiety, insomnia, bradycardia and Hx substance abuse  Barriers to Discharge: Behavior/Mood;Complicated Wound;Medical stability;Uncontrolled Pain   Possible Resolutions to [employer] Focus: medication and support for ongoing anxiety with Hx substance abuse, medication adjustment and bracing for L spasticity, titrating pain medications, monitor bradycardia   Continued Need for Acute  Rehabilitation Level of Care: The patient requires daily medical management by a physician with specialized training in physical medicine and rehabilitation for the following reasons: Direction of a multidisciplinary physical rehabilitation program to maximize functional independence : Yes Medical management of patient stability for increased activity during participation in an intensive rehabilitation regime.: Yes Analysis of laboratory values and/or radiology reports with any subsequent need for medication adjustment and/or medical intervention. : Yes   I attest that I was present, lead the team conference, and concur with the assessment and plan of the team.   Konrad Dolores [facility] 08/30/2023, 3:24 PM
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
## 2023-08-30 NOTE — Progress Notes (Signed)
 Speech Language Pathology Discharge Summary  Patient Details  Name: Erik Santana. MRN: [id number] Date of Birth: [birth year]  Date of Discharge from SLP service:August 30, 2023  Today's Date: 08/30/2023 SLP Individual Time: 0900-1000 SLP Individual Time Calculation (min): 60 min   Skilled Therapeutic Interventions:   The pt and his sister were greeted at bedside. He was very pleasant and cooperative throughout tx tasks targeting cognition. SLP facilitated deductive reasoning tasks targeting information processing, selective and alternating attention, and complex problem solving. He benefited from additional processing time, but was able to complete deductive reasoning tasks x2 @ modI. He presented w/ adequate awareness and reasoning during conversation re remaining physical limitations and planning for d/c. At the end of tx tasks, he was left in his bed with the call light within reach. Pt's sister remained present upon SLP departure. Skilled ST no longer warranted - see d/c summary below for more info.    Patient has met 3 of 3 long term goals.  Patient to discharge at overall Modified Independent level.  Reasons goals not met: n/a   Clinical Impression/Discharge Summary:  Pt has made excellent progress this admission, as evidenced by improved problem solving, processing, memory, and attention. Anxiety still negatively impacts overall success slightly, though pt currently completes all cognitive tasks @ modI. He demonstrates return to baseline cognitive-linguistic function and the ability to return to all prev roles/responsibilities. Skilled ST no longer warranted at this time.    Care Partner:  Caregiver Able to Provide Assistance: Yes  Type of Caregiver Assistance: Physical  Recommendation:  None - continued ST not warranted     Equipment: n/a   Reasons for discharge: Treatment goals met   Patient/Family Agrees with Progress Made and Goals Achieved: Yes    Pati Gallo 08/30/2023, 10:14 AM

## 2023-08-30 NOTE — Progress Notes (Signed)
 Patient ID: Erik Santana., male   DOB: May 25, 1991, 33 y.o.   MRN: [id number]  [id number] spoke with pt mother Efraim Kaufmann to provide updates from team conference, and d/c date 3/27. SW informed will continue to monitor if his Medicaid is approved and will update, if not, she understands pt will be set up for [facility] medication assistance program,and [facility]; prefers [facility] if insurance is not in place. SW discussed family edu closer towards discharge. SW will follow-up with updates after team conference.  *SW met with pt  and pt sister in room at bedside to inform on above.   Cecile Sheerer, MSW, LCSW Office: [phone] Cell: [phone] Fax: [phone]

## 2023-08-30 NOTE — Consult Note (Signed)
 Neuropsychological Consultation Comprehensive Inpatient Rehab   Patient:   Erik Santana.   DOB:   11/10/1990  MR Number:  [id number]  Location:  [facility] [address] CENTER A [address] [place] [address] Dept: [phone] Loc: [phone]           Date of Service:   08/30/2023  Start Time:   1 PM End Time:   2 PM  Provider/Observer:  Arley Phenix, Psy.D.       Clinical Neuropsychologist       Billing Code/Service: [phone]  Reason for Service:    Erik Santana. is a 33 year old male referred for neuropsychological consultation during his ongoing admission to the comprehensive inpatient rehabilitation unit.  Patient has a past medical history including attentional deficits, Mallory-Weiss tear, tobacco use and previous marijuana use that was significant in the past.  Patient is also used prescription medications purchased off the street previously.  Patient only tested positive for THC on his urine drug screen.  Patient presented on 08/21/2023 with acute onset of left-sided weakness, dizziness and headache.  Patient was the one who called EMS.  Patient was found on floor and blood pressure 130/81.  Neuroimaging showed hemorrhage centering on the right frontal parietal junction with no mass effect.  Follow-up MRI showed unchanged appearance of hematoma within the superior right frontal lobe.  Patient was admitted to CIR due to decreased functional mobility and ongoing cognitive deficits.  During today's clinical interview the patient noted changes in motor functions on left side and some sensory changes as well.  Patient also noted some difficulties with executive functioning and some slowed information processing.  However, he has been making gains.  Patient was oriented to being in the hospital and maintained adequate expressive and receptive language capacity.  Patient was oriented x 4.  Patient's sister was also present in  the room.  Patient denied significant anxiety changes but notes history of anxiety and attentional difficulties lifelong.  Patient's cognitive changes and presentation were consistent with right frontoparietal involvement.  Patient describes some "vertigo type symptoms" that may have had more to do with blood product impacting PTO brain region.  HPI for the current admission:    HPI: Erik, Santana. 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 Patient does endorse using Ativan and Percocet off the streets. Per chart review patient independent prior to admission working in [employer]. 1 level home with a ramped entrance. He does help provide care for his grandfather. Mother and family plans to assist as needed on discharge. Presented 08/21/2023 with acute onset of left-sided weakness as well as dizziness and headache. He was able to call EMS himself. EMS found him on the floor. Blood pressure 130/81. CT of the head showed a 3.9 x 2.4 x 4.1 cm parenchymal hemorrhage centered at the right frontal parietal junction. No evidence of intraventricular extension no mass effect. CT cervical spine negative. CTA showed no intracranial large vessel occlusion or significant stenosis. Echocardiogram with ejection fraction of 55 to 60% no wall motion abnormalities. Admission chemistries unremarkable except glucose 157, urine drug screen positive marijuana. MRI follow-up showed unchanged appearance of intraparenchymal hematoma within the superior right frontal lobe. No mass lesion. Arteriogram completed per interventional radiology showing no gross abnormalities. Neurology continues to follow with conservative care. Lovenox was initiated for DVT prophylaxis 08/23/2023. Close monitoring of blood pressure. He  has not required Cleviprex. [name] initiated for tobacco use. Therapy evaluations completed due to patient's decreased  functional mobility was admitted for a comprehensive rehab program.   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  presents as a 33 y.o.-year-old Right handed Caucasian Male who appeared his stated age. his dress was Appropriate and he was Well Groomed and his manners were Appropriate to the situation.  his participation was indicative of Appropriate and Redirectable behaviors.  There were physical disabilities noted.  he displayed an appropriate level of cooperation and motivation.    Interactions:    Active Appropriate  Attention:   abnormal and attention span appeared shorter than expected for age  Memory:   within normal limits; recent and remote memory intact  Visuo-spatial:   not examined  Speech (Volume):  low  Speech:   normal; normal  Thought Process:  Coherent and Relevant  Concrete and Linear  Though Content:  WNL; not suicidal and not homicidal  Orientation:   person, place, time/date, and situation  Judgment:   Fair  Planning:   Fair  Affect:    Appropriate  Mood:    Anxious  Insight:   Good  Intelligence:   normal  Psychiatric History:  Patient with prior psychiatric history including symptoms of anxiety as well as attentional deficits throughout his life.  Only current psychotropic medication includes trazodone taken at night to aid with sleep.  No record of prior psychotropic medications in his medical records going back to 2014.  History of Substance Use or  Abuse:  There is a documented history of marijuana and prescription drug abuse confirmed by the patient.    Family Med/Psych History:  Family History  Problem Relation Age of Onset   Healthy Mother    Healthy Father     Impression/DX:   Jjesus Dingley. is a 33 year old male referred for neuropsychological consultation during his ongoing admission to the comprehensive inpatient rehabilitation unit.  Patient has a past medical history including attentional deficits, Mallory-Weiss tear, tobacco use and previous marijuana use that was significant in the past.  Patient is also used prescription medications purchased off the street previously.  Patient only tested positive for THC on his urine drug screen.  Patient presented on 08/21/2023 with acute onset of left-sided weakness, dizziness and headache.  Patient was the one who called EMS.  Patient was found on floor and blood pressure 130/81.  Neuroimaging showed hemorrhage centering on the right frontal parietal junction with no mass effect.  Follow-up MRI showed unchanged appearance of hematoma within the superior right frontal lobe.  Patient was admitted to CIR due to decreased functional mobility and ongoing cognitive deficits.  During today's clinical interview the patient noted changes in motor functions on left side and some sensory changes as well.  Patient also noted some difficulties with executive functioning and some slowed information processing.  However, he has been making gains.  Patient was oriented to being in the  hospital and maintained adequate expressive and receptive language capacity.  Patient was oriented x 4.  Patient's sister was also present in the room.  Patient denied significant anxiety changes but notes history of anxiety and attentional difficulties lifelong.  Patient's cognitive changes and presentation were consistent with right frontoparietal involvement.  Patient describes some "vertigo type symptoms" that may have had more  to do with blood product impacting PTO brain region.  Disposition/Plan:  Today we worked on coping and adjustment issues and assessed his cognitive status which is continuing to improve with each day.          Electronically Signed   _______________________ Arley Phenix, Psy.D. Clinical Neuropsychologist

## 2023-08-30 NOTE — Progress Notes (Signed)
 Physical Therapy Session Note  Patient Details  Name: Erik Santana. MRN: [id number] Date of Birth: 28-Sep-1990  Today's Date: 08/30/2023 PT Individual Time: 1503-1600 PT Individual Time Calculation (min): 57 min   Short Term Goals: Week 1:  PT Short Term Goal 1 (Week 1): Pt will perform bed mobility with modA. PT Short Term Goal 2 (Week 1): Pt will perform sit to stand with modA consistently. PT Short Term Goal 3 (Week 1): Pt will perform bed to chair with modA consistently. PT Short Term Goal 4 (Week 1): Pt will ambulate x25' with modA and LRAD.  Skilled Therapeutic Interventions/Progress Updates:     Pt received supine in bed and agrees to therapy. No complaint of pain. Supine to sit with bed feature and cues for sequencing. Pt performs stand pivot to WC with modA and cues for sequencing, with Lt foot positioned on towel to decrease risk for rolling ankle during transfer. WC transport to gym. Pt performs stand pivot to mat table with same assistance and cues. Pt then completes stepping activity with RLE to promote WB through LLE and NMR. PT provides minA/modA at Lt knee and hip to promote stability and loading through slightly flexed knee joint, and tech is positioned on Rt side for stability. Pt completes x20 foot taps on 3" step with RLE, with 2 count hold on step to promote increased loading through LLE. Following rest break, pt completes 2x20 with tech providing assistance as needed, but pt generally performing activity without external assistance aside from PT. PT provides verbal and tactile cues for body mechanics, weight shifting, and optimal sequencing.   Pt ambulates from mat to Nustep, x15', with modA +2. Pt has significant Lt extensor tone during ambulation, and associated hip hiking on Rt side to progress LLE. Pt completes Nustep to promote reciprocal coordination. Pt completes total of x5:00 with several rest breaks, at workload of 3 with average steps per minute ~40. Pt performs  with only lower extremities with PT providing manual facilitation of LLE neutral rotation of hip and alignment of lower leg. Pt able to completes actively through full ROM, but has significant clonus in Lt ankle throughout activity. Pt performs stand pivot to WC with minA and same cues. Left seated in WC with all needs within reach.   Therapy Documentation Precautions:  Precautions Precautions: Fall Precaution/Restrictions Comments: left LE tone Restrictions Weight Bearing Restrictions Per Provider Order: No    Therapy/Group: Individual Therapy  [name], PT, DPT 08/30/2023, 4:19 PM

## 2023-08-30 NOTE — Plan of Care (Signed)
  Problem: RH Problem Solving Goal: LTG Patient will demonstrate problem solving for (SLP) Description: LTG:  Patient will demonstrate problem solving for basic/complex daily situations with cues  (SLP) Outcome: Completed/Met   Problem: RH Memory Goal: LTG Patient will demonstrate ability for day to day (SLP) Description: LTG:   Patient will demonstrate ability for day to day recall/carryover during cognitive/linguistic activities with assist  (SLP) Outcome: Completed/Met   Problem: RH Pre-functional/Other (Specify) Goal: RH LTG SLP (Specify) 1 Description: RH LTG SLP (Specify) 1 Outcome: Completed/Met

## 2023-08-31 DIAGNOSIS — I612 Nontraumatic intracerebral hemorrhage in hemisphere, unspecified: Secondary | ICD-10-CM | POA: Diagnosis not present

## 2023-08-31 MED ORDER — DIVALPROEX SODIUM 125 MG PO CSDR
125.0000 mg | DELAYED_RELEASE_CAPSULE | Freq: Two times a day (BID) | ORAL | Status: DC
  Administered 2023-08-31 – 2023-09-06 (×13): 125 mg via ORAL
  Filled 2023-08-31 (×13): qty 1

## 2023-08-31 NOTE — Progress Notes (Signed)
 Occupational Therapy Session Note  Patient Details  Name: Erik Santana. MRN: [id number] Date of Birth: [birth year]  Today's Date: 08/31/2023 OT Individual Time: 1049-1200 OT Individual Time Calculation (min): 71 min    Short Term Goals: Week 1:  OT Short Term Goal 1 (Week 1): Pt will use left UE as gross stabilization in grooming task OT Short Term Goal 2 (Week 1): Pt will be given instructions on self ROM to do in bed with left UE OT Short Term Goal 3 (Week 1): Pt will transfer with mod A to toilet  Skilled Therapeutic Interventions/Progress Updates:  Pt greeted seated in w/c, pt agreeable to OT intervention.      Transfers/bed mobility/functional mobility: pt completed squat pivots to mat table to R and L side with MINA, LLE blocked during transfers.   ADLs:  LB dressing: donned new pants from w/c with MAX A d/t time constraints, pt stood with MIN A while pts mother pulled pants to waist line.   Transfers: pt wanted to try to stand to use urinal, pt stood with MIN A while holding onto bed rail on R side, LLE blocked. Toileting: pt needed MIN A for 3/3 toileting tasks needing assist to manage pants and hold urinal in standing, pt unable to void in standing needing to sit in w/c. From sitting,  pt able to manage urinal with set- up assist. Pt with continent urine.    NMR:  Saebo Stim One applied to pt's LUE wrist extensors (for muscle activation. It was increased to level 7. 15 min attended e-stim with a focus on functional wrist extension- no c/o pain before/after tx and no adverse skin reactions.  330 pulse width 35 Hz pulse rate On 8 sec/ off 8 sec Ramp up/ down 2 sec Symmetrical Biphasic wave form  Max intensity at 500 Ohm load  Pt completed various weightbearing tasks while sitting EOM with pt leaning forward onto bench to provide NMR to LUE.  Pt completed 2x10 modified chest push ups/tricep push ups with pt needing MIN support at L elbow and to stabilize L hand in  place  Pt completed dynamic reaching task from anterior weightbearing position on bench with pt using RUE to reach across midline to retrieve cones from floor level and transport cones to opposite side of bench. Pt completed task with MIN A needed assist to keep LUE in place on bench.   Ended session with pt supine in bed with all needs within reach and bed alarm activated.                    Therapy Documentation Precautions:  Precautions Precautions: Fall Precaution/Restrictions Comments: left LE tone Restrictions Weight Bearing Restrictions Per Provider Order: No  Pain: No pain    Therapy/Group: Individual Therapy  Pollyann Glen [facility] 08/31/2023, 12:14 PM

## 2023-08-31 NOTE — Plan of Care (Signed)
  Problem: RH BOWEL ELIMINATION Goal: RH STG MANAGE BOWEL WITH ASSISTANCE Description: STG Manage Bowel with mod I Assistance. Outcome: Progressing   Problem: RH BOWEL ELIMINATION Goal: RH STG MANAGE BOWEL W/MEDICATION W/ASSISTANCE Description: STG Manage Bowel with Medication with  mod I Assistance. Outcome: Progressing   Problem: RH SAFETY Goal: RH STG ADHERE TO SAFETY PRECAUTIONS W/ASSISTANCE/DEVICE Description: STG Adhere to Safety Precautions With cues Assistance/Device. Outcome: Progressing

## 2023-08-31 NOTE — Progress Notes (Signed)
 Physical Therapy Session Note  Patient Details  Name: Erik Santana. MRN: [id number] Date of Birth: 17-Jun-1991  Today's Date: 08/31/2023 PT Individual Time: 4010-2725 PT Individual Time Calculation (min): 71 min   Today's Date: 08/31/2023 PT Individual Time: 3664-4034 PT Individual Time Calculation (min): 70 min   Short Term Goals: Week 1:  PT Short Term Goal 1 (Week 1): Pt will perform bed mobility with modA. PT Short Term Goal 2 (Week 1): Pt will perform sit to stand with modA consistently. PT Short Term Goal 3 (Week 1): Pt will perform bed to chair with modA consistently. PT Short Term Goal 4 (Week 1): Pt will ambulate x25' with modA and LRAD.  Skilled Therapeutic Interventions/Progress Updates:     Pt received supine in bed and agrees to therapy. Reports pain in Lt hemibody due to spasming. Number not provided. PT provides rest breaks as needed to manage pain. PT threads pants over each leg and pt is able to pull up over hips without assistance. PT also assists with donning shirt prior to mobility. Supine to sit with cues for positioning at EOB. Pt performs stand pivot to WC with modA and cues for sequencing, PT manually positioning LLE tfor safety and stability, and cues for hand placement. WC transport to gym. Pt transfer to [facility] with minA and cues for sequencing and positioning. Pt completes kinetron in sitting to work on reciprocal coordination and LLE hip extensor NMR. Pt completes initially at 30 cm/sec resistance and increases to 20 cm/sec, with PT providing mnaual facilitation of neutral hip rotation and body mechanics. Pt attempts to complete standing kinetron but has difficulty achieving stabile standing balance, or actively extending LLE, so activity terminated. Pt transfers to [facility] with minA and same cues. Pt then ambulates along Rt handrail in hallway for gait training. PT provides cues for desired gait pattern, including Lt lateral weight shifting and reciprocal stepping  pattern. Pt ambulates 2x30' with modA +1 and +2 for WC follow. PT provides assistance for progression of LLE to promote hip and knee flexion, as well as preventing Lt knee hyperextension in stance phase.   Pt completes x6:00 on upper extremity ergometer to provide both flexion and extension ROM pattens for LUE. PT provides cues for body mechanics and safety awareness.   WC transport back to room. Pt left seated with all needs within reach.    2nd Session: Pt received semi reclined in bed and agrees to hterapy. NO complaint of pain. Supine to sit with bed features. PT performs stand pivot transfer to [facility] with modA and cues for sequencing and positioning. WC tranpsort to gym. Pt performs standing activity in parallel bars with mirror for visual feedback. PT demonstrates desired movement. Pt completes repeated thigh taps on bar, facing mirror, with RLE to promote Lt hemibody WB, with LUE support on bar, and PT providing tactile cueing at Lt distal thigh to promote contraction and knee stability.  Pt completes 3x20 prior to rest break. Pt then performs alternating foot taps on 2 inch step, progressing to positioning LLE on step and shifting weight from Rt posterior leg to  Lt anterior leg, promoting, hip and knee flexion and ankle dorsiflexion. Pt takes seated rest break and then performs same activity with 4" step, with PT providing facilitation of desired body mechanics. Following rest break, activity progressed by having pt flex forward over LLE propped on 4" platform and then step RLE up to same level, then return to staggered stance. Pt completes x10 with minA/modA  and same cues. WC transport back to room. Stand pivot to bed with minA/modA and same cues. Left supine in bed with alarm intact and all needs within reahc.    Therapy Documentation Precautions:  Precautions Precautions: Fall Precaution/Restrictions Comments: left LE tone Restrictions Weight Bearing Restrictions Per Provider Order:  No    Therapy/Group: Individual Therapy  [name], PT, DPT 08/31/2023, 5:05 PM

## 2023-08-31 NOTE — Progress Notes (Signed)
 PROGRESS NOTE   Subjective/Complaints:  Patient doing well overall today.  No complaints, concerns.  Seen in therapy gym, is having significant tone and clonus in left lower extremity.  Also having some limiting clonus in left upper extremity.  He is agreeable to starting an antiepileptic to see if they can better control his clonus as opposed to upping spasticity medications further.  Some mild pain in left ankle after rolling it at the end of last week, but says that this is a frequent occurrence and it is not bothered him much.  Sleeping much better, feels mood is doing okay.  ROS: Denies fevers, chills, N/V, abdominal pain, diarrhea, SOB, cough, chest pain, new weakness or paraesthesias.   + insomnia--improving + vertigo--improving + Anxiety --improved + LUE and LLE spasticity-/myoclonus-ongoing + constipation--improved  Objective:   No results found. Recent Labs    08/30/23 0528  WBC 8.1  HGB 16.9  HCT 47.8  PLT 366    Recent Labs    08/29/23 1114  NA 135  K 4.0  CL 96*  CO2 27  GLUCOSE 92  BUN 10  CREATININE 0.85  CALCIUM 9.9     Intake/Output Summary (Last 24 hours) at 08/31/2023 0828 Last data filed at 08/30/2023 1445 Gross per 24 hour  Intake 480 ml  Output 200 ml  Net 280 ml        Physical Exam: Vital Signs Blood pressure 127/84, pulse 86, temperature 97.8 F (36.6 C), resp. rate 16, height 6\' 2"  (1.88 m), weight 76.8 kg, SpO2 97%.  Constitutional: No apparent distress. Appropriate appearance for age.  Sitting up in wheelchair. HENT: No JVD. Neck Supple. Trachea midline. Atraumatic, normocephalic.  Eyes: PERRLA. EOMI. Visual fields grossly intact.  + Vertiginous symptoms, Mild horizontal nystagmus.--Worse when looking to the right inferior quadrant-unchanged  Cardiovascular: RRR, no murmurs/rub/gallops. No Edema. Peripheral pulses 2+  Respiratory: CTAB. No rales, rhonchi, or wheezing. On  RA.  Abdomen: + bowel sounds, normoactive. No distention or tenderness.  Skin: C/D/I.  Mild bruise on left lateral ankle.   MSK:        No pain with squeeze of left foot or lower extremity above the malleoli.  No pain with range of motion.  No crepitus or deformity.      Neurologic exam:  Cognition: AAO to person, place, time and event.  No apparent cognitive deficits. Insight: Good insight into current condition.  Mood: Anxious mood, elevated but appropriate Sensation: Equal and intact in BL UE and Les.  Reflexes: 2+ in R  UE and LE; left lower extremity clonus that extinguishes with heel stretch.  CN: 2-12 grossly intact.  Coordination: Myo clonus with passive range of motion in the left lower and upper extremity. Spasticity:  Left upper extremity: MAS 2 shoulder abductors, elbow flexors, elbow extensors; MAS 3 wrist flexors; MAS 3 finger flexors, MAS 1 thumb adductors--ongoing  Left lower extremity: MAS 2 hip flexors, MAS 2 knee extensors, MAS 1 knee flexors, MAS 3 plantar flexors--ongoing       Strength:                LUE: 0/5 SA, 2/5 EF, 3/5 EE, 0/5 WE, 2/5  FF, 0/5 FA                RUE:  5/5 SA, 5/5 EF, 5/5 EE, 5/5 WE, 5/5 FF, 5/5 FA                LLE: 3/5 HF, 3/5 KE, 0/5  DF, 0/5  EHL, 1+/5  PF                 RLE:  5/5 HF, 5/5 KE, 5/5  DF, 5/5  EHL, 5/5  PF       Assessment/Plan: 1. Functional deficits which require 3+ hours per day of interdisciplinary therapy in a comprehensive inpatient rehab setting. Physiatrist is providing close team supervision and 24 hour management of active medical problems listed below. Physiatrist and rehab team continue to assess barriers to discharge/monitor patient progress toward functional and medical goals  Care Tool:  Bathing    Body parts bathed by patient: Left arm, Chest, Abdomen, Front perineal area, Right upper leg, Left upper leg, Face   Body parts bathed by helper: Buttocks, Right lower leg, Left lower leg     Bathing  assist Assist Level: Moderate Assistance - Patient 50 - 74%     Upper Body Dressing/Undressing Upper body dressing   What is the patient wearing?: Pull over shirt    Upper body assist Assist Level: Moderate Assistance - Patient 50 - 74%    Lower Body Dressing/Undressing Lower body dressing      What is the patient wearing?: Pants     Lower body assist Assist for lower body dressing: 2 Helpers     Toileting Toileting    Toileting assist Assist for toileting: Maximal Assistance - Patient 25 - 49%     Transfers Chair/bed transfer  Transfers assist     Chair/bed transfer assist level: Maximal Assistance - Patient 25 - 49%     Locomotion Ambulation   Ambulation assist      Assist level: 2 helpers Assistive device:  (Rt hand rail) Max distance: 5'   Walk 10 feet activity   Assist  Walk 10 feet activity did not occur: Safety/medical concerns (Fatigue)        Walk 50 feet activity   Assist Walk 50 feet with 2 turns activity did not occur: Safety/medical concerns         Walk 150 feet activity   Assist Walk 150 feet activity did not occur: Safety/medical concerns         Walk 10 feet on uneven surface  activity   Assist Walk 10 feet on uneven surfaces activity did not occur: Safety/medical concerns         Wheelchair     Assist Is the patient using a wheelchair?: Yes Type of Wheelchair: Manual    Wheelchair assist level: Dependent - Patient 0% Max wheelchair distance: 150'    Wheelchair 50 feet with 2 turns activity    Assist        Assist Level: Dependent - Patient 0%   Wheelchair 150 feet activity     Assist      Assist Level: Dependent - Patient 0%   Blood pressure 127/84, pulse 86, temperature 97.8 F (36.6 C), resp. rate 16, height 6\' 2"  (1.88 m), weight 76.8 kg, SpO2 97%.   Medical Problem List and Plan: 1. 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 Progressing well with therapies.    2.  Antithrombotics: -DVT/anticoagulation:  Pharmaceutical: Lovenox             -antiplatelet therapy: N/A   3. Pain Management:    - 2/27: Tylenol 350 to 650 mg every 6 hours for mild to moderate pain, tramadol 50 mg every 6 hours as needed for severe pain.  Has minimal complaints of pain so far, would consider gabapentin if additional needed.  3/3: Using tramadol approximately BID for lower back pain; with benefit.  4. Mood/Behavior/Sleep: Provide emotional support             -antipsychotic agents: N/A  -2-27: Was using Xanax at home for sleep.  Has Atarax on board, add as needed melatonin and scheduled trazodone 100 mg nightly.  Will get sleep log.  - 2-28: Sleep improved with trazodone and as needed melatonin; continue.  -3/3: addition of BuSpar 5 mg 3 times daily for anxiety--improving   5. Neuropsych/cognition: This patient is capable of making decisions on his own behalf.   - reporting severe ongoing anxiety with staff, recommending neuropsychiatry and behavioral assessment as outpatient   - improving  6. Skin/Wound Care: Routine skin checks   7. Fluids/Electrolytes/Nutrition: Routine in and outs with follow-up chemistries   -Potassium slightly low 3.4, otherwise labs stable.  -3-3: AM labs stable  8.  Tobacco use.  Continue [name]- consider discussion of decrease in dose given elevated blood pressure.  Provide counseling   9.  History of polysubstance use.  Urine drug screen positive marijuana.  Patient does endorse using Ativan and Percocet off the streets.  Provide counseling   10.  Constipation/diarrhea.  Continue Senokot S1 tablet twice daily.   -Multiple liquid stools overnight, large; DC Senokot  Improved  11.   Spasticity/myoclonus.  -Ordered left WHO today, may need custom per OT  -Increase baclofen from 10 mg twice daily to 10 mg 3 times daily; continue Zanaflex 2 mg every 8 hours as needed for spasms  -Monitor with therapy; would consider addition of antiepileptic if significant myoclonus  -Discussed with patient this a.m., may need Botox exam and injection while in inpatient rehab for left finger flexor tightness, will monitor 1 week on medication prior to pursuing  -2-28: Tone improved with increased baclofen; per therapy reports, ongoing issues with LLE myoclonus, could benefit from Depakote for this and ongoing mood/behavior difficulties, but will hold off to see if any continued improvement over the weekend on current medications.  3/2: asked Trey Paula PT if longer foot rest can be applied to wheelchair as patient experiencing clonus with current and feels it is too small  3/3: Using tizanidine and baclofen with benefit  3-5: Myoclonus remains very limiting.  Will avoid Valium due to substance use history; start Depakote 125 mg twice daily.  Will monitor LFTs in next few days.  12. Bradycardia/Incomplete bundle branch block - episodes of high 40s/low 50s intermittent throughout hospitalization  - EKG today NSR 60 bpm, with QTC 440   - No obvious medication contributors - very rare s/e to tramadol  - If  symptomatic, could consider cardiology consult and holter monitor/ziopatch HR reviewed and appears to have resolved, discussed  that lower heart rate at night is physiological  - 3/2: cardiology consulted, discussed with patient that no changes recommended  - 3/4: No bradycardia or sleep apnea on overnight pulse ox  14. Nasal congestion: asked nursing to advise patient not to use home nasal spray as it is a vasoconstrictor and can increase stroke risk  -Using saline nose spray only, can continue  15. Dizziness/vertigo   - Scopalamine patch   - Meclizine 25 mg TID PRN   - 3/4: OT doing vestibular  eval today--no peripheral vertigo  16. Constipation. Start colace 100 mg BID  - LBM large 3/3  17.  Left ankle roll.  No apparent deformity, negative [place] ankle rules for imaging. -Discussed with PT using Aircast for positioning and support given history of frequent ankle rolling -Not bothersome to the patient at this time  LOS: 7 days A FACE TO FACE EVALUATION WAS PERFORMED  Erik Santana 08/31/2023, 8:28 AM

## 2023-09-01 DIAGNOSIS — I612 Nontraumatic intracerebral hemorrhage in hemisphere, unspecified: Secondary | ICD-10-CM | POA: Diagnosis not present

## 2023-09-01 MED ORDER — HYDROCORTISONE (PERIANAL) 2.5 % EX CREA
TOPICAL_CREAM | Freq: Two times a day (BID) | CUTANEOUS | Status: DC | PRN
  Filled 2023-09-01: qty 28.35

## 2023-09-01 MED ORDER — POLYETHYLENE GLYCOL 3350 17 G PO PACK
17.0000 g | PACK | Freq: Every day | ORAL | Status: DC
  Administered 2023-09-01 – 2023-09-09 (×2): 17 g via ORAL
  Filled 2023-09-01 (×21): qty 1

## 2023-09-01 MED ORDER — SENNOSIDES-DOCUSATE SODIUM 8.6-50 MG PO TABS
1.0000 | ORAL_TABLET | Freq: Two times a day (BID) | ORAL | Status: DC
  Administered 2023-09-01 – 2023-09-22 (×43): 1 via ORAL
  Filled 2023-09-01 (×43): qty 1

## 2023-09-01 MED ORDER — BACLOFEN 5 MG HALF TABLET
15.0000 mg | ORAL_TABLET | Freq: Three times a day (TID) | ORAL | Status: DC
  Administered 2023-09-01 – 2023-09-22 (×62): 15 mg via ORAL
  Filled 2023-09-01 (×62): qty 1

## 2023-09-01 NOTE — Progress Notes (Signed)
 Physical Therapy Session Note  Patient Details  Name: Erik Santana. MRN: [id number] Date of Birth: Dec 14, 1990  Today's Date: 09/01/2023 PT Individual Time: 8295-6213 PT Individual Time Calculation (min): 70 min   Short Term Goals: Week 1:  PT Short Term Goal 1 (Week 1): Pt will perform bed mobility with modA. PT Short Term Goal 2 (Week 1): Pt will perform sit to stand with modA consistently. PT Short Term Goal 3 (Week 1): Pt will perform bed to chair with modA consistently. PT Short Term Goal 4 (Week 1): Pt will ambulate x25' with modA and LRAD.  Skilled Therapeutic Interventions/Progress Updates:    Pt's family member assisting with dressing at bed level upon PT arrival. Pt request to not don L shoe until in chair due to reporting his ankle rolling with it. Pt come to EOB with HOB elevated with CGA for LLE management. Sit > stand with max assist with LLE in extension with strong bias to the R. EOB clonus noted in LLE that decreased with weight applied manually. Attempted stand pivot but pt lifting RLE up into the air like a flamingo position and attempting to pivot. Sat pt back down, educated on keeping RLE on the ground for safety and then attempted again..same technique but assist with max assist to the w/c.  Blocked practice focus with NMR for transfers, postiral control retraining and transitional movement re-training. Blocked practice stand step transfer bed <> w/c with much improved technique and able to demo some "soft knee" flexion with equal weightbearing with overall mod assist both directions. Standing with L foot on step with forward bend to attempt to activate pelvic tilt and separate pelvic mobility for transitional movements. Blocked practice sit <> squats and sit <> stands with focus on equal weightbearing, breaking up tone and midline head/trunk placement (vs shifting to the R) and eccentric control with knee flexion in LLE - so much improvement noted and able to perform with  min assist. Min assist for stand step transfer when L knee not locking into extension, mod assist when it does for transfer over. Nustep for reciprocal movement pattern retraining using adaptive leg piece to maintain neutral alignment x 7 min on level 3. Transferred back ot bed with min/mod assist due to LLE going into extension again and return to supine with supervision.   Therapy Documentation Precautions:  Precautions Precautions: Fall Precaution/Restrictions Comments: left LE tone Restrictions Weight Bearing Restrictions Per Provider Order: No PAIN Pain Assessment Pain Scale: 0-10 Pain Score: 7  Pain Location: Arm Pain Intervention(s): Medication (See eMAR) Notified RN for pain medication and administered at start of session.     Therapy/Group: Individual Therapy  Karolee Stamps Darrol Poke, PT, DPT, CBIS  09/01/2023, 11:05 AM

## 2023-09-01 NOTE — Progress Notes (Signed)
 Occupational Therapy Session Note  Patient Details  Name: Erik Santana. MRN: [id number] Date of Birth: September 29, 1990  Today's Date: 09/01/2023 OT Individual Time: 4132-4401 OT Individual Time Calculation (min): 43 min    Short Term Goals: Week 1:  OT Short Term Goal 1 (Week 1): Pt will use left UE as gross stabilization in grooming task OT Short Term Goal 2 (Week 1): Pt will be given instructions on self ROM to do in bed with left UE OT Short Term Goal 3 (Week 1): Pt will transfer with mod A to toilet  Skilled Therapeutic Interventions/Progress Updates:  Pt greeted supine in bed, pt agreeable to OT intervention.      Transfers/bed mobility/functional mobility: pt completed supine>sit with MINA to manage LLE to EOB d/t tone. Pt completed stand pivot to w/c to L side with MODA.   ADLs:  UB dressing:pt donned [place] shirt with MIN A with pt able to thread LUE into sleeve via hemitechnique but needed assist to pull shirt down in back and front LB dressing: pt donned pants with MIN A needing assist to thread pants and pull to waist in standing, pt unable to figure 4 LLE d/t done   Bathing: pt completed bathing from seated/standing with MODA needing assist to wash buttock while pt completed lateral leans and assist to wash R side of body and underneath LUE Transfers: pt completed stand pivot in/out of shower with use of grab bar and MIN- MODA  Assisted pt with setting up lunch tray with pt needing assist to open items   Ended session with pt seated in w/c with BLEs elevated on bed with all needs within reach.                    Therapy Documentation Precautions:  Precautions Precautions: Fall Precaution/Restrictions Comments: left LE tone Restrictions Weight Bearing Restrictions Per Provider Order: No  Pain: unrated pain reported in L ankle, repositioning and rest breaks provided as needed, suggested pt ask about air cast to add increased support to L ankle. Pain meds provided during  session     Therapy/Group: Individual Therapy  Barron Schmid 09/01/2023, 3:10 PM

## 2023-09-01 NOTE — Progress Notes (Signed)
 Physical Therapy Session Note  Patient Details  Name: Erik Santana. MRN: [id number] Date of Birth: [birth year]  Today's Date: 09/01/2023 PT Individual Time: 4098-1191 PT Individual Time Calculation (min): 73 min   Short Term Goals: Week 1:  PT Short Term Goal 1 (Week 1): Pt will perform bed mobility with modA. PT Short Term Goal 2 (Week 1): Pt will perform sit to stand with modA consistently. PT Short Term Goal 3 (Week 1): Pt will perform bed to chair with modA consistently. PT Short Term Goal 4 (Week 1): Pt will ambulate x25' with modA and LRAD.  Skilled Therapeutic Interventions/Progress Updates:     Pt received seated in [facility] and agrees to therapy. Reports some pain in Lt ankle and requesting air cast due to repetitive rolling of ankle. PT alerts MD and aircast is ordered. PT ace wraps Lt ankle to provide stability and external cueing for tactile feedback at distal leg. WC transport to gym. Pt performs x30' gait training with modA +1 and +2 WC follow for safety. PT provides maxA for progression of LLE and facilitating hip and knee flexion. Pt performs x10 reps RLE lifts to promote left sided weight shifting and optimal motor planning. Pt completes x10' sidestepping to the lt and Rt for high level gait training and promotion of hip abductor activaiton, with PT providing modA and cues for foot placement, sequencing, and limiting extensor tone in LLE.  Pt completes standing activity in parallel bars with 4" step. Pt stand with RUE support, then releases RUE and PT assists to position LUE for support on parallel bar. Pt then performs repeated foot taps on step with RLE, with PT facilitating Lt lateral weight shifts, and promoting slight knee and hip flexion to attempt to limit extensor tone. Pt completes multiple bouts of x10 reps with seated rest breaks. WC transport back to room. Left seated with all needs within reach.   Therapy Documentation Precautions:  Precautions Precautions:  Fall Precaution/Restrictions Comments: left LE tone Restrictions Weight Bearing Restrictions Per Provider Order: No    Therapy/Group: Individual Therapy  [name], PT, DPT 09/01/2023, 2:58 PM

## 2023-09-01 NOTE — Progress Notes (Signed)
 PROGRESS NOTE   Subjective/Complaints:  Patient seen this a.m.; complaining of ongoing straining with stools.  States this was pre-existing, had recently been diagnosed with hemorrhoids and passage of stool has been painful.  Denies any recent blood in stool.  Since initiating Depakote, did not have any myoclonus with therapies this a.m.  Does have ongoing severe spasticity, states that tizanidine is somewhat sedating but does help.  Sleeping approximately 5 to 6 hours per night.  ROS: Denies fevers, chills, N/V, abdominal pain, diarrhea, SOB, cough, chest pain, new weakness or paraesthesias.    + insomnia--improving + vertigo--improving + Anxiety --improved + LUE and LLE myoclonus-much improved + Spasticity -ongoing  + constipation--ongoing  Objective:   No results found. Recent Labs    08/30/23 0528  WBC 8.1  HGB 16.9  HCT 47.8  PLT 366    Recent Labs    08/29/23 1114  NA 135  K 4.0  CL 96*  CO2 27  GLUCOSE 92  BUN 10  CREATININE 0.85  CALCIUM 9.9     Intake/Output Summary (Last 24 hours) at 09/01/2023 0941 Last data filed at 09/01/2023 0850 Gross per 24 hour  Intake 720 ml  Output 1800 ml  Net -1080 ml        Physical Exam: Vital Signs Blood pressure 125/81, pulse 63, temperature 98.8 F (37.1 C), temperature source Oral, resp. rate 18, height 6\' 2"  (1.88 m), weight 76.8 kg, SpO2 97%.  Constitutional: No apparent distress. Appropriate appearance for age.  Laying in bed. HENT: Atraumatic, normocephalic.  Eyes: PERRLA. EOMI.+ Vertiginous symptoms, Mild horizontal nystagmus.--Worse when looking to the right inferior quadrant-unchanged  Cardiovascular: RRR, no murmurs/rub/gallops. No Edema. Peripheral pulses 2+  Respiratory: CTAB. No rales, rhonchi, or wheezing. On RA.  Abdomen: + bowel sounds, normoactive. No distention or tenderness.  Skin: C/D/I.  Mild bruise on left lateral ankle.  Right  peripheral IV with mild erythema.   MSK:     Left ankle unchanged; no apparent deformity.  Left ankle range of motion 0-5.      Neurologic exam:  Cognition: AAO to person, place, time and event.  No apparent cognitive deficits. Insight: Good insight into current condition.  Mood: Anxious mood, elevated but appropriate Sensation: Equal and intact in BL UE and Les.  Reflexes: 2+ in R  UE and LE; no further clonus at left lower extremity with heel stretch. CN: 2-12 grossly intact.  Coordination: Myo clonus with passive range of motion in the left lower and upper extremity. Spasticity:  Left upper extremity: MAS 2 shoulder abductors, elbow flexors, elbow extensors; MAS 3 wrist flexors; MAS 3 finger flexors, MAS 1 thumb adductors--ongoing  Left lower extremity: MAS 2 hip flexors, MAS 2 knee extensors, MAS 1 knee flexors, MAS 3 plantar flexors--ongoing       Strength:                LUE: 0/5 SA, 2/5 EF, 3/5 EE, 0/5 WE, 2/5 FF, 0/5 FA--can abduct thumb 1 out of 5                RUE:  5/5 SA, 5/5 EF, 5/5 EE, 5/5 WE, 5/5 FF, 5/5 FA  LLE: 3/5 HF, 3/5 KE, 0/5  DF, 0/5  EHL, 1+/5  PF                 RLE:  5/5 HF, 5/5 KE, 5/5  DF, 5/5  EHL, 5/5  PF       Assessment/Plan: 1. Functional deficits which require 3+ hours per day of interdisciplinary therapy in a comprehensive inpatient rehab setting. Physiatrist is providing close team supervision and 24 hour management of active medical problems listed below. Physiatrist and rehab team continue to assess barriers to discharge/monitor patient progress toward functional and medical goals  Care Tool:  Bathing    Body parts bathed by patient: Left arm, Chest, Abdomen, Front perineal area, Right upper leg, Left upper leg, Face   Body parts bathed by helper: Buttocks, Right lower leg, Left lower leg     Bathing assist Assist Level: Moderate Assistance - Patient 50 - 74%     Upper Body Dressing/Undressing Upper body dressing    What is the patient wearing?: Pull over shirt    Upper body assist Assist Level: Moderate Assistance - Patient 50 - 74%    Lower Body Dressing/Undressing Lower body dressing      What is the patient wearing?: Pants     Lower body assist Assist for lower body dressing: 2 Helpers     Toileting Toileting    Toileting assist Assist for toileting: Maximal Assistance - Patient 25 - 49%     Transfers Chair/bed transfer  Transfers assist     Chair/bed transfer assist level: Maximal Assistance - Patient 25 - 49%     Locomotion Ambulation   Ambulation assist      Assist level: 2 helpers Assistive device:  (Rt hand rail) Max distance: 5'   Walk 10 feet activity   Assist  Walk 10 feet activity did not occur: Safety/medical concerns (Fatigue)        Walk 50 feet activity   Assist Walk 50 feet with 2 turns activity did not occur: Safety/medical concerns         Walk 150 feet activity   Assist Walk 150 feet activity did not occur: Safety/medical concerns         Walk 10 feet on uneven surface  activity   Assist Walk 10 feet on uneven surfaces activity did not occur: Safety/medical concerns         Wheelchair     Assist Is the patient using a wheelchair?: Yes Type of Wheelchair: Manual    Wheelchair assist level: Dependent - Patient 0% Max wheelchair distance: 150'    Wheelchair 50 feet with 2 turns activity    Assist        Assist Level: Dependent - Patient 0%   Wheelchair 150 feet activity     Assist      Assist Level: Dependent - Patient 0%   Blood pressure 125/81, pulse 63, temperature 98.8 F (37.1 C), temperature source Oral, resp. rate 18, height 6\' 2"  (1.88 m), weight 76.8 kg, SpO2 97%.   Medical Problem List and Plan: 1. 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 Progressing well with therapies.    2.  Antithrombotics: -DVT/anticoagulation:  Pharmaceutical: Lovenox             -antiplatelet therapy: N/A   3. Pain Management:    - 2/27: Tylenol 350 to 650 mg every 6 hours for mild to moderate pain, tramadol 50 mg every 6 hours as needed for severe pain.  Has minimal complaints of pain so far, would consider gabapentin if additional needed.  3/3: Using tramadol approximately BID for lower back pain; with benefit.  4. Mood/Behavior/Sleep: Provide emotional support             -antipsychotic agents: N/A  -2-27: Was using Xanax at home for sleep.  Has Atarax on board, add as needed melatonin and scheduled trazodone 100 mg nightly.  Will get sleep log.  - 2-28: Sleep improved with trazodone and as needed melatonin; continue.  -3/3: addition of BuSpar 5 mg 3 times daily for anxiety--improving   5. Neuropsych/cognition: This patient is capable of making decisions on his own behalf.   - reporting severe ongoing anxiety with staff, recommending neuropsychiatry and behavioral assessment as outpatient   - improving  6. Skin/Wound Care: Routine skin checks   -DC peripheral IV 7. Fluids/Electrolytes/Nutrition: Routine in and outs with follow-up chemistries   -Potassium slightly low 3.4, otherwise labs stable.  -3-3: AM labs stable  8.  Tobacco use.  Continue [name]- consider discussion of decrease in dose given elevated blood pressure.  Provide counseling   9.  History of polysubstance use.  Urine drug screen positive marijuana.  Patient does endorse using Ativan and Percocet off the streets.  Provide counseling   10.  Constipation/diarrhea.  Continue Senokot S1 tablet twice daily.   -Multiple liquid stools overnight, large; DC Senokot  Improved  11.  Spasticity/myoclonus.  -Ordered left WHO today, may need custom per  OT  -Increase baclofen from 10 mg twice daily to 10 mg 3 times daily; continue Zanaflex 2 mg every 8 hours as needed for spasms  -Monitor with therapy; would consider addition of antiepileptic if significant myoclonus  -Discussed with patient this a.m., may need Botox exam and injection while in inpatient rehab for left finger flexor tightness, will monitor 1 week on medication prior to pursuing  -2-28: Tone improved with increased baclofen; per therapy reports, ongoing issues with LLE myoclonus, could benefit from Depakote for this and ongoing mood/behavior difficulties, but will hold off to see if any continued improvement over the weekend on current medications.  3/2: asked Trey Paula PT if longer foot rest can be applied to wheelchair as patient experiencing clonus with current and feels it is too small  3/3: Using tizanidine and baclofen with benefit  3-5: Myoclonus remains very limiting.  Will avoid Valium due to substance use history; start Depakote 125 mg twice daily.  Will monitor LFTs in next few days.  3-6: Myoclonus much improved!  Continue Depakote at current dose, check LFTs on 3/7.  Tone remains severe, will increase baclofen to 15 mg 3 times daily tizanidine has been sedating.  12. Bradycardia/Incomplete bundle branch block - episodes of high 40s/low 50s intermittent throughout hospitalization  - EKG today NSR 60 bpm, with QTC 440   - No obvious medication contributors - very rare s/e to tramadol  - If  symptomatic, could consider cardiology consult and holter monitor/ziopatch HR reviewed and appears to have resolved, discussed that lower heart rate at night is physiological  - 3/2: cardiology  consulted, discussed with patient that no changes recommended  - 3/4: No bradycardia or sleep apnea on overnight pulse ox  14. Nasal congestion: asked nursing to advise patient not to use home nasal spray as it is a vasoconstrictor and can increase stroke risk  -Using saline nose spray only, can  continue  15. Dizziness/vertigo   - Scopalamine patch   - Meclizine 25 mg TID PRN   - 3/4: OT doing vestibular eval today--no peripheral vertigo  16. Constipation. Start colace 100 mg BID  - Daily bowel movements, endorsing straining -3-6: Increase to Senokot S1 tab twice daily, MiraLAX daily.  Add Anusol as needed.  17.  Left ankle roll.  No apparent deformity, negative [place] ankle rules for imaging. -Discussed with PT using Aircast for positioning and support given history of frequent ankle rolling -Not bothersome to the patient at this time  LOS: 8 days A FACE TO FACE EVALUATION WAS PERFORMED  Angelina Sheriff 09/01/2023, 9:41 AM

## 2023-09-01 NOTE — Progress Notes (Addendum)
 Patient ID: Erik Hai., male   DOB: [birth year], 33 y.o.   MRN: [id number]  SW informed pt mother pt now has insurance listed. SW shared will update if his insurance plan changes from a traditional Medicaid insurance.  SW updated medical team on changes.  [id number], Medicaid ID: [id number] M.  *SW called pt mother to inform on above.   Cecile Sheerer, MSW, LCSW Office: [phone] Cell: [phone] Fax: [phone]

## 2023-09-01 NOTE — Plan of Care (Signed)
  Problem: RH BOWEL ELIMINATION Goal: RH STG MANAGE BOWEL WITH ASSISTANCE Description: STG Manage Bowel with mod I Assistance. Outcome: Progressing   Problem: RH BOWEL ELIMINATION Goal: RH STG MANAGE BOWEL W/MEDICATION W/ASSISTANCE Description: STG Manage Bowel with Medication with  mod I Assistance. Outcome: Progressing   Problem: RH SAFETY Goal: RH STG ADHERE TO SAFETY PRECAUTIONS W/ASSISTANCE/DEVICE Description: STG Adhere to Safety Precautions With cues Assistance/Device. Outcome: Progressing

## 2023-09-01 NOTE — Progress Notes (Signed)
 Orthopedic Tech Progress Note Patient Details:  Erik Santana. 01-Sep-1990 [id number]  Ortho Devices Type of Ortho Device: Ankle Air splint Ortho Device/Splint Location: LLE Ortho Device/Splint Interventions: Ordered   Post Interventions Patient Tolerated: Well Instructions Provided: Care of device  [name] 09/01/2023, 5:35 PM

## 2023-09-02 DIAGNOSIS — I612 Nontraumatic intracerebral hemorrhage in hemisphere, unspecified: Secondary | ICD-10-CM | POA: Diagnosis not present

## 2023-09-02 LAB — COMPREHENSIVE METABOLIC PANEL
ALT: 28 U/L (ref 0–44)
AST: 18 U/L (ref 15–41)
Albumin: 3.8 g/dL (ref 3.5–5.0)
Alkaline Phosphatase: 54 U/L (ref 38–126)
Anion gap: 6 (ref 5–15)
BUN: 13 mg/dL (ref 6–20)
CO2: 28 mmol/L (ref 22–32)
Calcium: 9.1 mg/dL (ref 8.9–10.3)
Chloride: 102 mmol/L (ref 98–111)
Creatinine, Ser: 0.98 mg/dL (ref 0.61–1.24)
GFR, Estimated: >60 mL/min (ref >60)
Glucose, Bld: 90 mg/dL (ref 70–99)
Potassium: 4.1 mmol/L (ref 3.5–5.1)
Sodium: 136 mmol/L (ref 135–145)
Total Bilirubin: 0.5 mg/dL (ref 0.0–1.2)
Total Protein: 6.8 g/dL (ref 6.5–8.1)

## 2023-09-02 MED ORDER — BUSPIRONE HCL 15 MG PO TABS
7.5000 mg | ORAL_TABLET | Freq: Three times a day (TID) | ORAL | Status: DC
  Administered 2023-09-02 – 2023-09-13 (×32): 7.5 mg via ORAL
  Filled 2023-09-02 (×32): qty 1

## 2023-09-02 MED ORDER — TIZANIDINE HCL 4 MG PO TABS
4.0000 mg | ORAL_TABLET | Freq: Three times a day (TID) | ORAL | Status: DC
  Administered 2023-09-02 – 2023-09-22 (×59): 4 mg via ORAL
  Filled 2023-09-02 (×59): qty 1

## 2023-09-02 NOTE — Progress Notes (Signed)
 Occupational Therapy Session Note  Patient Details  Name: Erik Santana. MRN: [id number] Date of Birth: [birth year]  Today's Date: 09/02/2023 OT Individual Time: 1300-1415 OT Individual Time Calculation (min): 75 min    Short Term Goals: Week 2:  OT Short Term Goal 1 (Week 2): Pt will transfer to toilet toward the R with min A OT Short Term Goal 2 (Week 2): Pt will don pants with min A OT Short Term Goal 3 (Week 2): Pt will complete UB bathing with CGA OT Short Term Goal 4 (Week 2): Pt will position LUE appropriately at rest with no cueing  Skilled Therapeutic Interventions/Progress Updates:    Pt received siting up with no c/o pain, agreeable to OT session. Pt was taken via w/c to the therapy gym. He completed facilitated LLE weight bearing activity stepping onto a stair with RUE support. He required mod facilitation at the LLE, including prevention of hyperextension with input posteriorly at the knee and facilitation to flex hip/knee. He completed 3x10 repetitions as a preparatory activity. He then completed 100 ft of functional mobility in the Litegait with mod facilitation provided to flex knee/hip and prevent compensations via circumduction and RLE elevation. Trialed use of the [place] PLS AFO on the LLE while completing functional mobility at the R rail with [facility] improved knee hyperextension, requiring only min facilitation, as well as a reduction of his extensor tone overall- 50 ft. +2 present for trunk control as a precaution. He then transitioned into tall kneeling on the mat with mod A +2 for safety. Worked on midline orientation, glute activation bilaterally, and LUE/LLE weightbearing. He returned to his room and was left sitting up with all needs met, chair alarm set.    Therapy Documentation Precautions:  Precautions Precautions: Fall Precaution/Restrictions Comments: left LE tone Restrictions Weight Bearing Restrictions Per Provider Order: No  Therapy/Group:  Individual Therapy  Crissie Reese 09/02/2023, 9:07 AM

## 2023-09-02 NOTE — Progress Notes (Signed)
 PROGRESS NOTE   Subjective/Complaints:  No acute complaints.  No events overnight.  Patient seen working in therapy gym, having significant myoclonus in left lower extremity with e- cycle.  Endorsing ongoing severe anxiety, preventing him from being able to "relax", causing him to grip in his left hand.  Does feel overall things are improving.  ROS: Denies fevers, chills, N/V, abdominal pain, diarrhea, SOB, cough, chest pain, new weakness or paraesthesias.    + insomnia--improving + vertigo--improving + Anxiety --improved + LUE and LLE myoclonus-much improved + Spasticity -ongoing  + constipation--ongoing  Objective:   No results found. No results for input(s): "WBC", "HGB", "HCT", "PLT" in the last 72 hours.   Recent Labs    09/02/23 0629  NA 136  K 4.1  CL 102  CO2 28  GLUCOSE 90  BUN 13  CREATININE 0.98  CALCIUM 9.1     Intake/Output Summary (Last 24 hours) at 09/02/2023 0932 Last data filed at 09/02/2023 0802 Gross per 24 hour  Intake 600 ml  Output 1100 ml  Net -500 ml        Physical Exam: Vital Signs Blood pressure 116/89, pulse 69, temperature 98.2 F (36.8 C), resp. rate 16, height 6\' 2"  (1.88 m), weight 76.8 kg, SpO2 97%.  Constitutional: No apparent distress. Appropriate appearance for age.  Working on therapy gym cycle. HENT: Atraumatic, normocephalic.  Eyes: PERRLA. EOMI.+ Vertiginous symptoms, Mild horizontal nystagmus.--Worse when looking to the right inferior quadrant-mildly improving tolerance  Cardiovascular: RRR, no murmurs/rub/gallops. No Edema. Peripheral pulses 2+  Respiratory: CTAB. No rales, rhonchi, or wheezing. On RA.  Abdomen: + bowel sounds, normoactive. No distention or tenderness.  Skin: C/D/I.  Mild bruise on left lateral ankle.    MSK:     Left ankle unchanged; no apparent deformity.  In Aircast.      Neurologic exam:  Cognition: AAO to person, place, time and event.   No apparent cognitive deficits. Insight: Good insight into current condition.  Mood: Anxious mood, elevated but appropriate Sensation: Equal and intact in BL UE and Les.  Reflexes: 2+ in R  UE and LE; continuous myoclonus at left lower extremity, and break with heel stretch. CN: 2-12 grossly intact.  Spasticity:  Left upper extremity: MAS 2 shoulder abductors, elbow flexors, elbow extensors; MAS 2 wrist flexors; MAS 1 finger flexors (c/b motor apraxia and flexion synergy), MAS 1 thumb adductors  Left lower extremity: MAS 2 hip flexors, MAS 2 knee extensors, MAS 1 knee flexors, MAS 2 plantar flexors        Strength:                LUE: 0/5 SA, 2/5 EF, 3/5 EE, 1/5 WE, 3/5 FF, 2/5 FA--improving                RUE:  5/5 SA, 5/5 EF, 5/5 EE, 5/5 WE, 5/5 FF, 5/5 FA                LLE: 4/5 HF, 3/5 KE, 0/5  DF, 0/5  EHL, 1+/5  PF                 RLE:  5/5 HF, 5/5  KE, 5/5  DF, 5/5  EHL, 5/5  PF       Assessment/Plan: 1. Functional deficits which require 3+ hours per day of interdisciplinary therapy in a comprehensive inpatient rehab setting. Physiatrist is providing close team supervision and 24 hour management of active medical problems listed below. Physiatrist and rehab team continue to assess barriers to discharge/monitor patient progress toward functional and medical goals  Care Tool:  Bathing    Body parts bathed by patient: Left arm, Chest, Abdomen, Front perineal area, Right upper leg, Left upper leg, Face   Body parts bathed by helper: Buttocks, Right lower leg, Left lower leg     Bathing assist Assist Level: Moderate Assistance - Patient 50 - 74%     Upper Body Dressing/Undressing Upper body dressing   What is the patient wearing?: Pull over shirt    Upper body assist Assist Level: Moderate Assistance - Patient 50 - 74%    Lower Body Dressing/Undressing Lower body dressing      What is the patient wearing?: Pants     Lower body assist Assist for lower body dressing:  2 Helpers     Toileting Toileting    Toileting assist Assist for toileting: Maximal Assistance - Patient 25 - 49%     Transfers Chair/bed transfer  Transfers assist     Chair/bed transfer assist level: Moderate Assistance - Patient 50 - 74%     Locomotion Ambulation   Ambulation assist      Assist level: 2 helpers Assistive device:  (Rt hand rail) Max distance: 5'   Walk 10 feet activity   Assist  Walk 10 feet activity did not occur: Safety/medical concerns (Fatigue)        Walk 50 feet activity   Assist Walk 50 feet with 2 turns activity did not occur: Safety/medical concerns         Walk 150 feet activity   Assist Walk 150 feet activity did not occur: Safety/medical concerns         Walk 10 feet on uneven surface  activity   Assist Walk 10 feet on uneven surfaces activity did not occur: Safety/medical concerns         Wheelchair     Assist Is the patient using a wheelchair?: Yes Type of Wheelchair: Manual    Wheelchair assist level: Dependent - Patient 0% Max wheelchair distance: 150'    Wheelchair 50 feet with 2 turns activity    Assist        Assist Level: Dependent - Patient 0%   Wheelchair 150 feet activity     Assist      Assist Level: Dependent - Patient 0%   Blood pressure 116/89, pulse 69, temperature 98.2 F (36.8 C), resp. rate 16, height 6\' 2"  (1.88 m), weight 76.8 kg, SpO2 97%.   Medical Problem List and Plan: 1. 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 Progressing well with therapies.    2.  Antithrombotics: -DVT/anticoagulation:  Pharmaceutical: Lovenox             -antiplatelet therapy: N/A  3. Pain Management:    - 2/27: Tylenol  350 to 650 mg every 6 hours for mild to moderate pain, tramadol 50 mg every 6 hours as needed for severe pain.  Has minimal complaints of pain so far, would consider gabapentin if additional needed.  3/3: Using tramadol approximately BID for lower back pain; with benefit.  4. Mood/Behavior/Sleep: Provide emotional support             -antipsychotic agents: N/A  -2-27: Was using Xanax at home for sleep.  Has Atarax on board, add as needed melatonin and scheduled trazodone 100 mg nightly.  Will get sleep log.  - 2-28: Sleep improved with trazodone and as needed melatonin; continue.  -3/3: addition of BuSpar 5 mg 3 times daily for anxiety--improving 3/7: Increase BuSpar to 7.5 mg 3 times daily   5. Neuropsych/cognition: This patient is capable of making decisions on his own behalf.   - reporting severe ongoing anxiety with staff, recommending neuropsychiatry and behavioral assessment as outpatient   - improving  6. Skin/Wound Care: Routine skin checks   -DC peripheral IV  7. Fluids/Electrolytes/Nutrition: Routine in and outs with follow-up chemistries   -Potassium slightly low 3.4, otherwise labs stable.  -3-3: AM labs stable  8.  Tobacco use.  Continue [name]- consider discussion of decrease in dose given elevated blood pressure.  Provide counseling   9.  History of polysubstance use.  Urine drug screen positive marijuana.  Patient does endorse using Ativan and Percocet off the streets.  Provide counseling   10.  Constipation/diarrhea.  Continue Senokot S1 tablet twice daily.   -Multiple liquid stools overnight, large; DC Senokot  Improved  11.  LLE >> LUE Spasticity/myoclonus.  -Ordered left WHO today, may need custom per OT  -Increase baclofen from 10 mg twice daily to 10 mg 3 times daily; continue Zanaflex 2 mg every 8 hours as needed for spasms  -Monitor with therapy; would consider addition of antiepileptic if significant myoclonus  -Discussed with patient this a.m., may  need Botox exam and injection while in inpatient rehab for left finger flexor tightness, will monitor 1 week on medication prior to pursuing  -2-28: Tone improved with increased baclofen; per therapy reports, ongoing issues with LLE myoclonus, could benefit from Depakote for this and ongoing mood/behavior difficulties, but will hold off to see if any continued improvement over the weekend on current medications.  3/2: asked Trey Paula PT if longer foot rest can be applied to wheelchair as patient experiencing clonus with current and feels it is too small  3/3: Using tizanidine and baclofen with benefit  3-5: Myoclonus remains very limiting.  Will avoid Valium due to substance use history; start Depakote 125 mg twice daily.  Will monitor LFTs in next few days.  3-6: Myoclonus improved!  Continue Depakote at current dose, check LFTs on 3/7.  Tone remains severe, will increase baclofen to 15 mg 3 times daily tizanidine has been sedating.  3.7: LFTs normal, myoclonus with effort today but much easier to break and no longer occurring at rest.  Left upper extremity getting some finger and wrist extension back!  Will increase tizanidine to 4 mg 3 times daily and look at adjusting Depakote next week.  12. Bradycardia/Incomplete bundle branch block - episodes of high 40s/low 50s intermittent throughout hospitalization  - EKG today NSR 60 bpm, with QTC 440   - No obvious medication contributors - very rare s/e to tramadol  - If  symptomatic, could consider cardiology consult  and holter monitor/ziopatch HR reviewed and appears to have resolved, discussed that lower heart rate at night is physiological  - 3/2: cardiology consulted, discussed with patient that no changes recommended  - 3/4: No bradycardia or sleep apnea on overnight pulse ox  14. Nasal congestion: asked nursing to advise patient not to use home nasal spray as it is a vasoconstrictor and can increase stroke risk  -Using saline nose spray only, can  continue  15. Dizziness/vertigo   - Scopalamine patch   - Meclizine 25 mg TID PRN   - 3/4: OT doing vestibular eval today--no peripheral vertigo  16. Constipation. Start colace 100 mg BID  - Daily bowel movements, endorsing straining -3-6: Increase to Senokot S1 tab twice daily, MiraLAX daily.  Add Anusol as needed.  17.  Left ankle roll.  No apparent deformity, negative [place] ankle rules for imaging. -Discussed with PT using Aircast for positioning and support given history of frequent ankle rolling -Not bothersome to the patient at this time  LOS: 9 days A FACE TO FACE EVALUATION WAS PERFORMED  Erik Santana 09/02/2023, 9:32 AM

## 2023-09-02 NOTE — Progress Notes (Signed)
 Physical Therapy Weekly Progress Note  Patient Details  Name: Erik Santana. MRN: [id number] Date of Birth: Nov 20, 1990  Beginning of progress report period: August 25, 2023 End of progress report period: September 02, 2023  Today's Date: 09/02/2023 PT Individual Time: 1003-1059 PT Individual Time Calculation (min): 56 min   Patient has met 4 of 4 short term goals. Pt is progressing well toward mobility goals, improving independence with bed mobility, balance, transfers, and ambulation. Pt continues to have significant tone in Lt hemibody, requiring modA for safe transfers and ambulation, often requiring +2 for WC follow for safety. Pt will benefit from family education prior to discharge.   Patient continues to demonstrate the following deficits muscle weakness, decreased cardiorespiratoy endurance, motor apraxia and decreased coordination, and decreased sitting balance, decreased standing balance, decreased postural control, hemiplegia, and decreased balance strategies and therefore will continue to benefit from skilled PT intervention to increase functional independence with mobility.  Patient progressing toward long term goals..  Continue plan of care.  PT Short Term Goals Week 1:  PT Short Term Goal 1 (Week 1): Pt will perform bed mobility with modA. PT Short Term Goal 1 - Progress (Week 1): Met PT Short Term Goal 2 (Week 1): Pt will perform sit to stand with modA consistently. PT Short Term Goal 2 - Progress (Week 1): Met PT Short Term Goal 3 (Week 1): Pt will perform bed to chair with modA consistently. PT Short Term Goal 3 - Progress (Week 1): Met PT Short Term Goal 4 (Week 1): Pt will ambulate x25' with modA and LRAD. PT Short Term Goal 4 - Progress (Week 1): Met Week 2:  PT Short Term Goal 1 (Week 2): Pt will complete sit to stand consistently with CGA. PT Short Term Goal 2 (Week 2): Pt will complete bed to chair consistently with minA. PT Short Term Goal 3 (Week 2): Pt will  ambulate x25' with minA and LRAD.  Skilled Therapeutic Interventions/Progress Updates:     Pt received seated in [facility] and agrees to therapy. Reports pain in LUE and LLE. Number not provided. PT provides rest breaks as needed to manage pain. WC transport to gym. PT assists to don aircast to Lt ankle for stability and pt verbalizes improved feeling. Pt performs stand pivot transfer from [facility] to Nustep with minA/modA and cues for initiation, sequencing, and positioning. Pt completes Nustep for reciprocal coordination training. Pt completes x10:00 total with rest breaks, with only lower extremities. Leg bar utilized to maintain Lt hip in neutral rotation and positioning. PT provides cues for completing full available ROM and provides manual stretch of heel cord periodically to limit clonus.   Pt performs stand pivot transfer from Nustep>WC>mat with minA/modA and same cues. Seated in short sitting on edge of mat with LLE unsupported, pt tasked with kicking soccer ball to PT, working on coordination, quad activation, and motor planning. Pt completes x20 with cues for correct performance and body mechanics. Stand pivot back to [facility] with same assistance and cues. Left seated with alarm intact and all needs within reach.    Therapy Documentation Precautions:  Precautions Precautions: Fall Precaution/Restrictions Comments: left LE tone Restrictions Weight Bearing Restrictions Per Provider Order: No   Therapy/Group: Individual Therapy  [name], PT, DPT 09/02/2023, 4:01 PM

## 2023-09-02 NOTE — Progress Notes (Signed)
 Occupational Therapy Weekly Progress Note  Patient Details  Name: Erik Santana. MRN: [id number] Date of Birth: [birth year]  Beginning of progress report period: 08/25/23 End of progress report period: 09/02/23  Today's Date: 09/02/2023 OT Individual Time: 7829-5621 OT Individual Time Calculation (min): 63 min    Patient has met 2 of 3 short term goals.  Erik Santana has made great progress toward his OT goals in his first week in rehab. Aggressive NMR has been initiated with his LUE and he is now able to complete composite gross grasp when facilitated into extension, as well as gravity eliminated elbow flexion, about 20 degrees. He still has significant flexor tone that is being medically managed with medications, as well as with e-stim and PROM from OT. He is able to don a shirt with min A and requires max A for toileting, mod A for LB dressing. His transfers have improved to a mod A for stand pivots, but still requires heavy facilitation at the LLE during mobility. His trunk control has improved and sitting balance statically is close to (S). Family education will occur closer to d/c   Patient continues to demonstrate the following deficits: muscle weakness and muscle joint tightness, impaired timing and sequencing, abnormal tone, unbalanced muscle activation, and decreased coordination, decreased midline orientation, and decreased sitting balance, decreased standing balance, decreased postural control, hemiplegia, and decreased balance strategies and therefore will continue to benefit from skilled OT intervention to enhance overall performance with BADL and iADL.  Patient progressing toward long term goals..  Continue plan of care.  OT Short Term Goals Week 1:  OT Short Term Goal 1 (Week 1): Pt will use left UE as gross stabilization in grooming task OT Short Term Goal 1 - Progress (Week 1): Not met OT Short Term Goal 2 (Week 1): Pt will be given instructions on self ROM to do in bed with left  UE OT Short Term Goal 2 - Progress (Week 1): Met OT Short Term Goal 3 (Week 1): Pt will transfer with mod A to toilet OT Short Term Goal 3 - Progress (Week 1): Met Week 2:  OT Short Term Goal 1 (Week 2): Pt will transfer to toilet toward the R with min A OT Short Term Goal 2 (Week 2): Pt will don pants with min A OT Short Term Goal 3 (Week 2): Pt will complete UB bathing with CGA OT Short Term Goal 4 (Week 2): Pt will position LUE appropriately at rest with no cueing  Skilled Therapeutic Interventions/Progress Updates:    Pt received supine with no c/o pain, agreeable to OT session focused on shower. He came to EOB with (S), self managing LLE, excited to show OT improved volitional hip flexion. Extensor tone still present, as well as frequent clonus in the LLE during session. His LUE was passively ranged prior to start of mobility. His LUE was manually placed on arm rest of w/c and he was able to maintain grasp as he completed a squat pivot transfer toward the L with mod A, also requiring set up/positioning of his LLE. He transferred into the shower with mod A using grab bar. Mod A to doff LB clothing. He completed UB bathing with CGA seated. LB with min A following demo and cueing for lateral lean technique. He stood and OT completed more thorough bathing with mod A. He transferred back to the w/c following. He donned a shirt with min A. He required mod A to don shorts. Oral care completed with set  up assist. Education provided throughout self care on strategies to increase independence at home. He was left sitting up with all needs met following.    [place] Stim One was applied to his L dorsal wrist to promote wrist and finger extension. He had no c/o pain and no adverse skin reaction. 60 min unattended e stim.  330 pulse width 35 Hz pulse rate On 8 sec/ off 8 sec Ramp up/ down 2 sec Symmetrical Biphasic wave form  Max intensity at 500 Ohm load     Therapy Documentation Precautions:   Precautions Precautions: Fall Precaution/Restrictions Comments: left LE tone Restrictions Weight Bearing Restrictions Per Provider Order: No  Therapy/Group: Individual Therapy  Crissie Reese 09/02/2023, 6:16 AM

## 2023-09-02 NOTE — Plan of Care (Signed)
  Problem: Consults Goal: RH STROKE PATIENT EDUCATION Description: See Patient Education module for education specifics  Outcome: Progressing   Problem: RH BOWEL ELIMINATION Goal: RH STG MANAGE BOWEL WITH ASSISTANCE Description: STG Manage Bowel with mod I Assistance. Outcome: Progressing   Problem: RH SAFETY Goal: RH STG ADHERE TO SAFETY PRECAUTIONS W/ASSISTANCE/DEVICE Description: STG Adhere to Safety Precautions With cues Assistance/Device. Outcome: Progressing   Problem: RH PAIN MANAGEMENT Goal: RH STG PAIN MANAGED AT OR BELOW PT'S PAIN GOAL Description: < 4 with prns Outcome: Progressing

## 2023-09-03 DIAGNOSIS — K5901 Slow transit constipation: Secondary | ICD-10-CM | POA: Diagnosis not present

## 2023-09-03 DIAGNOSIS — S06339A Contusion and laceration of cerebrum, unspecified, with loss of consciousness of unspecified duration, initial encounter: Secondary | ICD-10-CM | POA: Diagnosis not present

## 2023-09-03 NOTE — Plan of Care (Signed)
  Problem: Consults Goal: RH STROKE PATIENT EDUCATION Description: See Patient Education module for education specifics  Outcome: Progressing   Problem: RH BOWEL ELIMINATION Goal: RH STG MANAGE BOWEL WITH ASSISTANCE Description: STG Manage Bowel with mod I Assistance. Outcome: Progressing Goal: RH STG MANAGE BOWEL W/MEDICATION W/ASSISTANCE Description: STG Manage Bowel with Medication with  mod I Assistance. Outcome: Progressing   Problem: RH PAIN MANAGEMENT Goal: RH STG PAIN MANAGED AT OR BELOW PT'S PAIN GOAL Description: < 4 with prns Outcome: Progressing   Problem: RH KNOWLEDGE DEFICIT Goal: RH STG INCREASE KNOWLEDGE OF HYPERTENSION Description: Patient and family will be able to manage HTN with medications and educational resources for dietary modification, lifestyle modification independently. Outcome: Progressing Goal: RH STG INCREASE KNOWLEGDE OF HYPERLIPIDEMIA Description: Patient and family will be able to manage HLD with medications and educational resources for dietary modification, lifestyle modification independently. Outcome: Progressing Goal: RH STG INCREASE KNOWLEDGE OF STROKE PROPHYLAXIS Description: Patient and family will be able to manage secondary risks with medications and educational resources for dietary modification, lifestyle modification independently. Outcome: Progressing   Problem: RH SAFETY Goal: RH STG ADHERE TO SAFETY PRECAUTIONS W/ASSISTANCE/DEVICE Description: STG Adhere to Safety Precautions With cues Assistance/Device. Outcome: Adequate for Discharge

## 2023-09-03 NOTE — Progress Notes (Signed)
 PROGRESS NOTE   Subjective/Complaints:  Pt doing well, slept ok, pain well managed, LBM last night and was "good", urinating fine. Wants to know what his labs were. Also wants his vitamin D checked. Denies any other complaints or concerns.   ROS: Denies fevers, chills, N/V, abdominal pain, diarrhea, SOB, cough, chest pain, new weakness or paraesthesias.    + insomnia--improving + vertigo--improving + Anxiety --improved + LUE and LLE myoclonus-much improved + Spasticity -ongoing  + constipation--ongoing  Objective:   No results found. No results for input(s): "WBC", "HGB", "HCT", "PLT" in the last 72 hours.   Recent Labs    09/02/23 0629  NA 136  K 4.1  CL 102  CO2 28  GLUCOSE 90  BUN 13  CREATININE 0.98  CALCIUM 9.1     Intake/Output Summary (Last 24 hours) at 09/03/2023 1203 Last data filed at 09/02/2023 2334 Gross per 24 hour  Intake 120 ml  Output 1055 ml  Net -935 ml        Physical Exam: Vital Signs Blood pressure 121/83, pulse (!) 57, temperature 98 F (36.7 C), resp. rate 16, height 6\' 2"  (1.88 m), weight 76.8 kg, SpO2 97%.  Constitutional: No apparent distress. Appropriate appearance for age. Resting in bed. HENT: Atraumatic, normocephalic.  Eyes: PERRLA. EOMI.+ Vertiginous symptoms, Mild horizontal nystagmus.--Worse when looking to the right inferior quadrant-mildly improving tolerance-- not reassessed today.   Cardiovascular: RRR, no murmurs/rub/gallops. No Edema. Peripheral pulses 2+  Respiratory: CTAB. No rales, rhonchi, or wheezing. On RA.  Abdomen: + bowel sounds, normoactive. No distention or tenderness.  Skin: C/D/I.   Psych: pleasant and cooperative  PRIOR EXAMS: MSK:     Left ankle unchanged; no apparent deformity.  In Aircast.Mild bruise on left lateral ankle.       Neurologic exam:  Cognition: AAO to person, place, time and event.  No apparent cognitive deficits. Insight: Good  insight into current condition.  Mood: Anxious mood, elevated but appropriate Sensation: Equal and intact in BL UE and Les.  Reflexes: 2+ in R  UE and LE; continuous myoclonus at left lower extremity, and break with heel stretch. CN: 2-12 grossly intact.  Spasticity:  Left upper extremity: MAS 2 shoulder abductors, elbow flexors, elbow extensors; MAS 2 wrist flexors; MAS 1 finger flexors (c/b motor apraxia and flexion synergy), MAS 1 thumb adductors  Left lower extremity: MAS 2 hip flexors, MAS 2 knee extensors, MAS 1 knee flexors, MAS 2 plantar flexors        Strength:                LUE: 0/5 SA, 2/5 EF, 3/5 EE, 1/5 WE, 3/5 FF, 2/5 FA--improving                RUE:  5/5 SA, 5/5 EF, 5/5 EE, 5/5 WE, 5/5 FF, 5/5 FA                LLE: 4/5 HF, 3/5 KE, 0/5  DF, 0/5  EHL, 1+/5  PF                 RLE:  5/5 HF, 5/5 KE, 5/5  DF, 5/5  EHL, 5/5  PF       Assessment/Plan: 1. Functional deficits which require 3+ hours per day of interdisciplinary therapy in a comprehensive inpatient rehab setting. Physiatrist is providing close team supervision and 24 hour management of active medical problems listed below. Physiatrist and rehab team continue to assess barriers to discharge/monitor patient progress toward functional and medical goals  Care Tool:  Bathing    Body parts bathed by patient: Left arm, Chest, Abdomen, Front perineal area, Right upper leg, Left upper leg, Face   Body parts bathed by helper: Buttocks, Right lower leg, Left lower leg     Bathing assist Assist Level: Moderate Assistance - Patient 50 - 74%     Upper Body Dressing/Undressing Upper body dressing   What is the patient wearing?: Pull over shirt    Upper body assist Assist Level: Moderate Assistance - Patient 50 - 74%    Lower Body Dressing/Undressing Lower body dressing      What is the patient wearing?: Pants     Lower body assist Assist for lower body dressing: 2 Helpers     Toileting Toileting     Toileting assist Assist for toileting: Maximal Assistance - Patient 25 - 49%     Transfers Chair/bed transfer  Transfers assist     Chair/bed transfer assist level: Moderate Assistance - Patient 50 - 74%     Locomotion Ambulation   Ambulation assist      Assist level: 2 helpers Assistive device:  (Rt hand rail) Max distance: 5'   Walk 10 feet activity   Assist  Walk 10 feet activity did not occur: Safety/medical concerns (Fatigue)        Walk 50 feet activity   Assist Walk 50 feet with 2 turns activity did not occur: Safety/medical concerns         Walk 150 feet activity   Assist Walk 150 feet activity did not occur: Safety/medical concerns         Walk 10 feet on uneven surface  activity   Assist Walk 10 feet on uneven surfaces activity did not occur: Safety/medical concerns         Wheelchair     Assist Is the patient using a wheelchair?: Yes Type of Wheelchair: Manual    Wheelchair assist level: Dependent - Patient 0% Max wheelchair distance: 150'    Wheelchair 50 feet with 2 turns activity    Assist        Assist Level: Dependent - Patient 0%   Wheelchair 150 feet activity     Assist      Assist Level: Dependent - Patient 0%   Blood pressure 121/83, pulse (!) 57, temperature 98 F (36.7 C), resp. rate 16, height 6\' 2"  (1.88 m), weight 76.8 kg, SpO2 97%.   Medical Problem List and Plan: 1. 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 Progressing well with therapies.    2.  Antithrombotics: -DVT/anticoagulation:  Pharmaceutical: Lovenox d/c'd 3/1             -antiplatelet therapy: N/A   3. Pain Management:  -  2/27: Tylenol 350 to 650 mg every 6 hours  for mild to moderate pain, tramadol 50 mg every 6 hours as needed for severe pain.  Has minimal complaints of pain so far, would consider gabapentin if additional needed.  3/3: Using tramadol approximately BID for lower back pain; with benefit.  4. Mood/Behavior/Sleep: Provide emotional support             -antipsychotic agents: N/A -2-27: Was using Xanax at home for sleep.  Has Atarax on board, add as needed melatonin and scheduled trazodone 100 mg nightly.  Will get sleep log.  - 2-28: Sleep improved with trazodone and as needed melatonin; continue.  -3/3: addition of BuSpar 5 mg 3 times daily for anxiety--improving 3/7: Increase BuSpar to 7.5 mg 3 times daily   5. Neuropsych/cognition: This patient is capable of making decisions on his own behalf. - reporting severe ongoing anxiety with staff, recommending neuropsychiatry and behavioral assessment as outpatient   - improving  6. Skin/Wound Care: Routine skin checks   -DC peripheral IV  7. Fluids/Electrolytes/Nutrition: Routine in and outs with follow-up chemistries   -Potassium slightly low 3.4, otherwise labs stable.  -3-3: AM labs stable  -09/03/23 pt requested vitamin D level for Mondays labs, ordered  8.  Tobacco use.  Continue [name]- consider discussion of decrease in dose given elevated blood pressure.  Provide counseling   9.  History of polysubstance use.  Urine drug screen positive marijuana.  Patient does endorse using Ativan and Percocet off the streets.  Provide counseling   10.  Constipation/diarrhea.  Continue Senokot S1 tablet twice daily.   -Multiple liquid stools overnight, large; DC Senokot  Improved  -09/03/23 LBM last night, "good one", monitor  11.  LLE >> LUE Spasticity/myoclonus.  -Ordered left WHO today, may need custom per OT -Increase baclofen from 10 mg twice daily to 10 mg 3 times daily; continue Zanaflex 2 mg every 8 hours as needed for spasms -Monitor with therapy; would consider addition of  antiepileptic if significant myoclonus -Discussed with patient this a.m., may need Botox exam and injection while in inpatient rehab for left finger flexor tightness, will monitor 1 week on medication prior to pursuing -2-28: Tone improved with increased baclofen; per therapy reports, ongoing issues with LLE myoclonus, could benefit from Depakote for this and ongoing mood/behavior difficulties, but will hold off to see if any continued improvement over the weekend on current medications. 3/2: asked Trey Paula PT if longer foot rest can be applied to wheelchair as patient experiencing clonus with current and feels it is too small  3/3: Using tizanidine and baclofen with benefit 3-5: Myoclonus remains very limiting.  Will avoid Valium due to substance use history; start Depakote 125 mg twice daily.  Will monitor LFTs in next few days. 3-6: Myoclonus improved!  Continue Depakote at current dose, check LFTs on 3/7.  Tone remains severe, will increase baclofen to 15 mg 3 times daily tizanidine has been sedating. 3.7: LFTs normal, myoclonus with effort today but much easier to break and no longer occurring at rest.  Left upper extremity getting some finger and wrist extension back!  Will increase tizanidine to 4 mg 3 times daily and look at adjusting Depakote next week.  12. Bradycardia/Incomplete bundle branch block - episodes of high 40s/low 50s intermittent throughout hospitalization  - EKG today NSR 60 bpm, with QTC 440   - No obvious medication contributors - very rare s/e to tramadol  - If  symptomatic, could consider cardiology  consult and holter monitor/ziopatch -HR reviewed and appears to have resolved, discussed that lower heart rate at night is physiological  - 3/2: cardiology consulted, discussed with patient that no changes recommended  - 3/4: No bradycardia or sleep apnea on overnight pulse ox  14. Nasal congestion: asked nursing to advise patient not to use home nasal spray as it is a  vasoconstrictor and can increase stroke risk  -Using saline nose spray only, can continue  15. Dizziness/vertigo   - Scopalamine patch   - Meclizine 25 mg TID PRN   - 3/4: OT doing vestibular eval today--no peripheral vertigo  16. Constipation. Start colace 100 mg BID - Daily bowel movements, endorsing straining -3-6: Increase to Senokot S1 tab twice daily, MiraLAX daily.  Add Anusol as needed. -SEE #10 ABOVE  17.  Left ankle roll.  No apparent deformity, negative [place] ankle rules for imaging. -Discussed with PT using Aircast for positioning and support given history of frequent ankle rolling -Not bothersome to the patient at this time  LOS: 10 days A FACE TO FACE EVALUATION WAS PERFORMED  [address] 09/03/2023, 12:03 PM

## 2023-09-04 DIAGNOSIS — S06339A Contusion and laceration of cerebrum, unspecified, with loss of consciousness of unspecified duration, initial encounter: Secondary | ICD-10-CM | POA: Diagnosis not present

## 2023-09-04 DIAGNOSIS — K5901 Slow transit constipation: Secondary | ICD-10-CM | POA: Diagnosis not present

## 2023-09-04 NOTE — Progress Notes (Signed)
 Mom assisted with it did not see it

## 2023-09-04 NOTE — Progress Notes (Signed)
 PROGRESS NOTE   Subjective/Complaints:  Pt doing well again, slept well, pain well managed, LBM yesterday, urinating fine. Denies any other complaints or concerns.   ROS: Denies fevers, chills, N/V, abdominal pain, diarrhea, SOB, cough, chest pain, new weakness or paraesthesias.    + insomnia--improving + vertigo--improving + Anxiety --improved + LUE and LLE myoclonus-much improved + Spasticity -ongoing/improving + constipation--improved  Objective:   No results found. No results for input(s): "WBC", "HGB", "HCT", "PLT" in the last 72 hours.   Recent Labs    09/02/23 0629  NA 136  K 4.1  CL 102  CO2 28  GLUCOSE 90  BUN 13  CREATININE 0.98  CALCIUM 9.1     Intake/Output Summary (Last 24 hours) at 09/04/2023 1008 Last data filed at 09/04/2023 0849 Gross per 24 hour  Intake 796 ml  Output 2425 ml  Net -1629 ml        Physical Exam: Vital Signs Blood pressure 115/67, pulse 73, temperature 98.3 F (36.8 C), resp. rate 16, height 6\' 2"  (1.88 m), weight 76.8 kg, SpO2 98%.  Constitutional: No apparent distress. Appropriate appearance for age. Up  in bed eating breakfast. HENT: Atraumatic, normocephalic.  Eyes: PERRLA. EOMI.+ Vertiginous symptoms, Mild horizontal nystagmus.--Worse when looking to the right inferior quadrant-mildly improving tolerance-- not reassessed today.   Cardiovascular: RRR, no murmurs/rub/gallops. No Edema. Peripheral pulses 2+  Respiratory: CTAB. No rales, rhonchi, or wheezing. On RA.  Abdomen: + bowel sounds, normoactive. No distention or tenderness.  Skin: C/D/I.   Psych: pleasant and cooperative, hopeful  PRIOR EXAMS: MSK:     Left ankle unchanged; no apparent deformity.  In Aircast.Mild bruise on left lateral ankle.       Neurologic exam:  Cognition: AAO to person, place, time and event.  No apparent cognitive deficits. Insight: Good insight into current condition.  Mood:  Anxious mood, elevated but appropriate Sensation: Equal and intact in BL UE and Les.  Reflexes: 2+ in R  UE and LE; continuous myoclonus at left lower extremity, and break with heel stretch. CN: 2-12 grossly intact.  Spasticity:  Left upper extremity: MAS 2 shoulder abductors, elbow flexors, elbow extensors; MAS 2 wrist flexors; MAS 1 finger flexors (c/b motor apraxia and flexion synergy), MAS 1 thumb adductors  Left lower extremity: MAS 2 hip flexors, MAS 2 knee extensors, MAS 1 knee flexors, MAS 2 plantar flexors        Strength:                LUE: 0/5 SA, 2/5 EF, 3/5 EE, 1/5 WE, 3/5 FF, 2/5 FA--improving                RUE:  5/5 SA, 5/5 EF, 5/5 EE, 5/5 WE, 5/5 FF, 5/5 FA                LLE: 4/5 HF, 3/5 KE, 0/5  DF, 0/5  EHL, 1+/5  PF                 RLE:  5/5 HF, 5/5 KE, 5/5  DF, 5/5  EHL, 5/5  PF       Assessment/Plan: 1. Functional deficits which require  3+ hours per day of interdisciplinary therapy in a comprehensive inpatient rehab setting. Physiatrist is providing close team supervision and 24 hour management of active medical problems listed below. Physiatrist and rehab team continue to assess barriers to discharge/monitor patient progress toward functional and medical goals  Care Tool:  Bathing    Body parts bathed by patient: Left arm, Chest, Abdomen, Front perineal area, Right upper leg, Left upper leg, Face   Body parts bathed by helper: Buttocks, Right lower leg, Left lower leg     Bathing assist Assist Level: Moderate Assistance - Patient 50 - 74%     Upper Body Dressing/Undressing Upper body dressing   What is the patient wearing?: Pull over shirt    Upper body assist Assist Level: Moderate Assistance - Patient 50 - 74%    Lower Body Dressing/Undressing Lower body dressing      What is the patient wearing?: Pants     Lower body assist Assist for lower body dressing: 2 Helpers     Toileting Toileting    Toileting assist Assist for toileting:  Maximal Assistance - Patient 25 - 49%     Transfers Chair/bed transfer  Transfers assist     Chair/bed transfer assist level: Moderate Assistance - Patient 50 - 74%     Locomotion Ambulation   Ambulation assist      Assist level: 2 helpers Assistive device:  (Rt hand rail) Max distance: 5'   Walk 10 feet activity   Assist  Walk 10 feet activity did not occur: Safety/medical concerns (Fatigue)        Walk 50 feet activity   Assist Walk 50 feet with 2 turns activity did not occur: Safety/medical concerns         Walk 150 feet activity   Assist Walk 150 feet activity did not occur: Safety/medical concerns         Walk 10 feet on uneven surface  activity   Assist Walk 10 feet on uneven surfaces activity did not occur: Safety/medical concerns         Wheelchair     Assist Is the patient using a wheelchair?: Yes Type of Wheelchair: Manual    Wheelchair assist level: Dependent - Patient 0% Max wheelchair distance: 150'    Wheelchair 50 feet with 2 turns activity    Assist        Assist Level: Dependent - Patient 0%   Wheelchair 150 feet activity     Assist      Assist Level: Dependent - Patient 0%   Blood pressure 115/67, pulse 73, temperature 98.3 F (36.8 C), resp. rate 16, height 6\' 2"  (1.88 m), weight 76.8 kg, SpO2 98%.   Medical Problem List and Plan: 1. 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 Progressing well with therapies.    2.  Antithrombotics: -DVT/anticoagulation:  Pharmaceutical: Lovenox d/c'd 3/1             -antiplatelet therapy: N/A   3. Pain Management:  - 2/27: Tylenol 350 to 650 mg every 6 hours for mild to moderate pain,  tramadol 50 mg  every 6 hours as needed for severe pain.  Has minimal complaints of pain so far, would consider gabapentin if additional needed.  3/3: Using tramadol approximately BID for lower back pain; with benefit.  4. Mood/Behavior/Sleep: Provide emotional support             -antipsychotic agents: N/A -2-27: Was using Xanax at home for sleep.  Has Atarax on board, add as needed melatonin and scheduled trazodone 100 mg nightly.  Will get sleep log.  - 2-28: Sleep improved with trazodone and as needed melatonin; continue.  -3/3: addition of BuSpar 5 mg 3 times daily for anxiety--improving 3/7: Increase BuSpar to 7.5 mg 3 times daily   5. Neuropsych/cognition: This patient is capable of making decisions on his own behalf. - reporting severe ongoing anxiety with staff, recommending neuropsychiatry and behavioral assessment as outpatient   - improving  6. Skin/Wound Care: Routine skin checks   -DC peripheral IV  7. Fluids/Electrolytes/Nutrition: Routine in and outs with follow-up chemistries   -Potassium slightly low 3.4, otherwise labs stable.  -3-3: AM labs stable  -09/03/23 pt requested vitamin D level for Mondays labs, ordered  8.  Tobacco use.  Continue [name]- consider discussion of decrease in dose given elevated blood pressure.  Provide counseling   9.  History of polysubstance use.  Urine drug screen positive marijuana.  Patient does endorse using Ativan and Percocet off the streets.  Provide counseling   10.  Constipation/diarrhea.  Continue Senokot S1 tablet twice daily.   -Multiple liquid stools overnight, large; DC Senokot  Improved  -09/04/23 LBM yesterday, monitor  11.  LLE >> LUE Spasticity/myoclonus.  -Ordered left WHO today, may need custom per OT -Increase baclofen from 10 mg twice daily to 10 mg 3 times daily; continue Zanaflex 2 mg every 8 hours as needed for spasms -Monitor with therapy; would consider addition of antiepileptic if significant myoclonus -Discussed with  patient this a.m., may need Botox exam and injection while in inpatient rehab for left finger flexor tightness, will monitor 1 week on medication prior to pursuing -2-28: Tone improved with increased baclofen; per therapy reports, ongoing issues with LLE myoclonus, could benefit from Depakote for this and ongoing mood/behavior difficulties, but will hold off to see if any continued improvement over the weekend on current medications. 3/2: asked Trey Paula PT if longer foot rest can be applied to wheelchair as patient experiencing clonus with current and feels it is too small  3/3: Using tizanidine and baclofen with benefit 3-5: Myoclonus remains very limiting.  Will avoid Valium due to substance use history; start Depakote 125 mg twice daily.  Will monitor LFTs in next few days. 3-6: Myoclonus improved!  Continue Depakote at current dose, check LFTs on 3/7.  Tone remains severe, will increase baclofen to 15 mg 3 times daily tizanidine has been sedating. 3.7: LFTs normal, myoclonus with effort today but much easier to break and no longer occurring at rest.  Left upper extremity getting some finger and wrist extension back!  Will increase tizanidine to 4 mg 3 times daily and look at adjusting Depakote next week.  12. Bradycardia/Incomplete bundle branch block - episodes of high 40s/low 50s intermittent throughout hospitalization  - EKG today NSR 60 bpm, with QTC 440   - No obvious medication contributors - very rare s/e to tramadol  - If  symptomatic, could consider cardiology consult and holter monitor/ziopatch -HR reviewed and appears to have resolved, discussed that lower heart rate at  night is physiological  - 3/2: cardiology consulted, discussed with patient that no changes recommended  - 3/4: No bradycardia or sleep apnea on overnight pulse ox  14. Nasal congestion: asked nursing to advise patient not to use home nasal spray as it is a vasoconstrictor and can increase stroke risk  -Using saline nose  spray only, can continue  15. Dizziness/vertigo   - Scopalamine patch   - Meclizine 25 mg TID PRN   - 3/4: OT doing vestibular eval today--no peripheral vertigo  16. Constipation. Start colace 100 mg BID - Daily bowel movements, endorsing straining -3-6: Increase to Senokot S1 tab twice daily, MiraLAX daily.  Add Anusol as needed. -SEE #10 ABOVE  17.  Left ankle roll.  No apparent deformity, negative [place] ankle rules for imaging. -Discussed with PT using Aircast for positioning and support given history of frequent ankle rolling -Not bothersome to the patient at this time   LOS: 11 days A FACE TO FACE EVALUATION WAS PERFORMED  [address] 09/04/2023, 10:08 AM

## 2023-09-04 NOTE — Plan of Care (Signed)
  Problem: Consults Goal: RH STROKE PATIENT EDUCATION Description: See Patient Education module for education specifics  Outcome: Progressing   Problem: RH BOWEL ELIMINATION Goal: RH STG MANAGE BOWEL WITH ASSISTANCE Description: STG Manage Bowel with mod I Assistance. Outcome: Progressing Goal: RH STG MANAGE BOWEL W/MEDICATION W/ASSISTANCE Description: STG Manage Bowel with Medication with  mod I Assistance. Outcome: Progressing   Problem: RH PAIN MANAGEMENT Goal: RH STG PAIN MANAGED AT OR BELOW PT'S PAIN GOAL Description: < 4 with prns Outcome: Progressing   Problem: RH KNOWLEDGE DEFICIT Goal: RH STG INCREASE KNOWLEDGE OF HYPERTENSION Description: Patient and family will be able to manage HTN with medications and educational resources for dietary modification, lifestyle modification independently. Outcome: Progressing Goal: RH STG INCREASE KNOWLEGDE OF HYPERLIPIDEMIA Description: Patient and family will be able to manage HLD with medications and educational resources for dietary modification, lifestyle modification independently. Outcome: Progressing Goal: RH STG INCREASE KNOWLEDGE OF STROKE PROPHYLAXIS Description: Patient and family will be able to manage secondary risks with medications and educational resources for dietary modification, lifestyle modification independently. Outcome: Progressing

## 2023-09-04 NOTE — Progress Notes (Signed)
 Occupational Therapy Session Note  Patient Details  Name: Erik Santana. MRN: [id number] Date of Birth: 12/07/1990  Today's Date: 09/04/2023 OT Individual Time: 1400-1500 OT Individual Time Calculation (min): 60 min    Short Term Goals: Week 2:  OT Short Term Goal 1 (Week 2): Pt will transfer to toilet toward the R with min A OT Short Term Goal 2 (Week 2): Pt will don pants with min A OT Short Term Goal 3 (Week 2): Pt will complete UB bathing with CGA OT Short Term Goal 4 (Week 2): Pt will position LUE appropriately at rest with no cueing  Skilled Therapeutic Interventions/Progress Updates:      Therapy Documentation Precautions:  Precautions Precautions: Fall Precaution/Restrictions Comments: left LE tone Restrictions Weight Bearing Restrictions Per Provider Order: No General: "I feel so much better" Pt supine in bed upon OT arrival, agreeable to OT session.  Pain: no pain reported  ADL: Pt completed the following activities in order to increase independence with ADL tasks and increase activity tolerance. Pt completed the following activities with current levels of assist listed: Bed mobility: SBA using bed rails from flat bed, using hooking method for LLE management in/out of bed UB dressing: Min A, assistance with managing shirt over LUE, education provided on hemi dressing technique, able to doff SBA LB dressing: Max A, managing over LLE and waist. Pt able to manage over RLE. Pt standing at grab bar in shower with RUE for stability in order for OT to manage pants over waist Footwear: total A doffing/donning grip socks Shower transfer: Min A using grab bars and RUE for assistance, stand pivot transfer from TIS><TTB Bathing: Min A, OT issued long handle sponge fr increased independence with LB bathing, pt required assistance washing buttocks in standing with pt stabilizing at grab bar with RUE.  Transfers: bed><TIS Min A using bed rail completing stand pivot with increased  time  Other Treatments: OT educated pt on various adaptive gaming controllers for PS5 and Xbox in order to increase leisure activity for current hobbies with decreased FMC and LUE function compared to PLOF. Pt educated on installable bidet in order to increase independence with toileting once D/C.    Pt supine in bed with bed alarm activated, 2 bed rails up, call light within reach and 4Ps assessed.   Therapy/Group: Individual Therapy [name], OTD, OTR/L 09/04/2023, 4:07 PM

## 2023-09-04 NOTE — Plan of Care (Signed)
  Problem: Consults Goal: RH STROKE PATIENT EDUCATION Description: See Patient Education module for education specifics  Outcome: Progressing   Problem: RH BOWEL ELIMINATION Goal: RH STG MANAGE BOWEL WITH ASSISTANCE Description: STG Manage Bowel with mod I Assistance. Outcome: Progressing Goal: RH STG MANAGE BOWEL W/MEDICATION W/ASSISTANCE Description: STG Manage Bowel with Medication with  mod I Assistance. Outcome: Progressing   Problem: RH SAFETY Goal: RH STG ADHERE TO SAFETY PRECAUTIONS W/ASSISTANCE/DEVICE Description: STG Adhere to Safety Precautions With cues Assistance/Device. Outcome: Adequate for Discharge

## 2023-09-05 DIAGNOSIS — I612 Nontraumatic intracerebral hemorrhage in hemisphere, unspecified: Secondary | ICD-10-CM | POA: Diagnosis not present

## 2023-09-05 LAB — BASIC METABOLIC PANEL
Anion gap: 8 (ref 5–15)
BUN: 11 mg/dL (ref 6–20)
CO2: 25 mmol/L (ref 22–32)
Calcium: 9.3 mg/dL (ref 8.9–10.3)
Chloride: 104 mmol/L (ref 98–111)
Creatinine, Ser: 1.01 mg/dL (ref 0.61–1.24)
GFR, Estimated: >60 mL/min (ref >60)
Glucose, Bld: 91 mg/dL (ref 70–99)
Potassium: 4.1 mmol/L (ref 3.5–5.1)
Sodium: 137 mmol/L (ref 135–145)

## 2023-09-05 LAB — CBC
HCT: 45.3 % (ref 39.0–52.0)
Hemoglobin: 15.5 g/dL (ref 13.0–17.0)
MCH: 28.7 pg (ref 26.0–34.0)
MCHC: 34.2 g/dL (ref 30.0–36.0)
MCV: 83.9 fL (ref 80.0–100.0)
Platelets: 417 10*3/uL — ABNORMAL HIGH (ref 150–400)
RBC: 5.4 MIL/uL (ref 4.22–5.81)
RDW: 12.5 % (ref 11.5–15.5)
WBC: 6.5 10*3/uL (ref 4.0–10.5)
nRBC: 0 % (ref 0.0–0.2)

## 2023-09-05 LAB — VITAMIN D 25 HYDROXY (VIT D DEFICIENCY, FRACTURES): Vit D, 25-Hydroxy: 20.9 ng/mL — ABNORMAL LOW (ref 30–100)

## 2023-09-05 NOTE — Progress Notes (Signed)
 Orthopedic Tech Progress Note Patient Details:  Ad Guttman. January 16, 1991 [id number]  0945 called in order to [employer] for a boot, I asked him to come and see the patient so we could order the right product   Patient ID: Nedra Hai., male   DOB: 09/05/1990, 33 y.o.   MRN: [id number]  [name] 09/05/2023, 11:34 AM

## 2023-09-05 NOTE — Progress Notes (Signed)
 Occupational Therapy Session Note  Patient Details  Name: Erik Santana. MRN: [id number] Date of Birth: 12-May-1991  Today's Date: 09/05/2023  Session 1 OT Individual Time: 5784-6962 OT Individual Time Calculation (min): 70 min   Session 2  Today's Date: 09/05/2023 OT Individual Time: 9528-4132 OT Individual Time Calculation (min): 27 min    Short Term Goals: Week 2:  OT Short Term Goal 1 (Week 2): Pt will transfer to toilet toward the R with min A OT Short Term Goal 2 (Week 2): Pt will don pants with min A OT Short Term Goal 3 (Week 2): Pt will complete UB bathing with CGA OT Short Term Goal 4 (Week 2): Pt will position LUE appropriately at rest with no cueing  Skilled Therapeutic Interventions/Progress Updates:    Session 1 Pt received supine with no c/o pain, agreeable to OT session. Provided education on wearing resting hand splint vs palm protector at night. Pt reporting wrist hurting with resting hand splint, provided edu on taking a break and putting back on to tolerate use as much as possible. He tolerated PROM to each joint in the LUE/LLE. Increased tone in ankle and wrist. Notified MD of request for elbow bledsoe d/t worsening tone and resting tightness in elbow at rest. He completed bed mobility to EOB with (S). Shoes donned with L AFO with max A. He stood with cueing for placing RUE across his chest to promote more even weightbearing through BLE to reduce compensatory strategies. Stand pivot to the w/c with mod A. Stand pivot transfer into the shower with mod A. Challenged standing balance in shower with having pt complete 5+ min static standing balance, multiple cues for technique and positioning to maximize LLE weightbearing. He was able to remove his RUE from the grab bar to assist with hair care. His LUE was able to grasp grab bar with facilitation. He completed UB bathing seated with LH sponge and adaptive techniques, (S). He completed LB with lateral lean with close CGA.  He transferred back to the w/c following with mod A. He completed oral care seated with set up assist. He donned a shirt with min A, shorts with mod A. Pt was left sitting up in the w/c with all needs met and call bell within reach.    Session 2 Pt received in w/c with no c/o pain and agreeable to session. He was instructed in hemi w/c propulsion technique. He was able to propel 100 ft with mod cueing and difficulty maintaining straight trajectory. Remainder of session focused on LUE NMR in the [place] mobile arm support, in conjunction with the saebo e-stim unit. Parameters below. No reports of pain or adverse skin. Saebo placed on supraspinatus and middle deltoid with excellent shoulder abduction activation. Provided active facilitation throughout session with pt demonstrating minimal activation in shoulder flexion, extension, and bicep flexion. Saebo then placed on tricep, bicep, and pec major with anterior deltoid, all separate trials with several minutes focused on AAROM at each joint. Pt returned to their room following. Pt was left sitting up in the w/c with all needs met and call bell within reach.    Saebo Stim One was placed on his dorsal wrist for wrist and finger extension activation to maximize NMR and tone management. 60 min unattended e stim, no adverse skin reaction or pain.  330 pulse width 35 Hz pulse rate On 8 sec/ off 8 sec Ramp up/ down 2 sec Symmetrical Biphasic wave form  Max intensity at 500 Ohm  load   Therapy Documentation Precautions:  Precautions Precautions: Fall Precaution/Restrictions Comments: left LE tone Restrictions Weight Bearing Restrictions Per Provider Order: No  Therapy/Group: Individual Therapy  Crissie Reese 09/05/2023, 6:11 AM

## 2023-09-05 NOTE — Progress Notes (Signed)
 PROGRESS NOTE   Subjective/Complaints:  Vital stable. A.m. labs without significant changes, vitamin D is low at 20.  Mild thrombocytosis. Patient endorses significant ongoing anxiety, especially when tone in his left upper lower extremity.  Endorses difficulty wearing PRAFO at night, poor positioning.  ROS: Denies fevers, chills, N/V, abdominal pain, diarrhea, SOB, cough, chest pain, new weakness or paraesthesias.    + insomnia--improving + vertigo--improving + Anxiety --ongoing + LUE and LLE myoclonus-intermittent, ongoing + Spasticity -ongoing/improving + constipation--improved  Objective:   No results found. No results for input(s): "WBC", "HGB", "HCT", "PLT" in the last 72 hours.   No results for input(s): "NA", "K", "CL", "CO2", "GLUCOSE", "BUN", "CREATININE", "CALCIUM" in the last 72 hours.    Intake/Output Summary (Last 24 hours) at 09/05/2023 0757 Last data filed at 09/05/2023 0519 Gross per 24 hour  Intake 720 ml  Output 1050 ml  Net -330 ml        Physical Exam: Vital Signs Blood pressure 114/84, pulse 75, temperature 98.2 F (36.8 C), resp. rate 16, height 6\' 2"  (1.88 m), weight 76.8 kg, SpO2 98%.  Constitutional: No apparent distress. Appropriate appearance for age. Up  in bed eating breakfast. HENT: Atraumatic, normocephalic.  Eyes: PERRLA. EOMI.+ Vertiginous symptoms, Mild horizontal nystagmus.--Worse when looking to the right inferior quadrant-mildly improving tolerance- Cardiovascular: RRR, no murmurs/rub/gallops. No Edema. Peripheral pulses 2+  Respiratory: CTAB. No rales, rhonchi, or wheezing. On RA.  Abdomen: + bowel sounds, normoactive. No distention or tenderness.  Skin: C/D/I.   Psych: Anxious, but appropriate.  MSK:     Left ankle unchanged mild bruising on the anterior lateral aspect; no apparent deformity.       Neurologic exam:  Cognition: AAO to person, place, time and event.   No apparent cognitive deficits. Insight: Good insight into current condition.  Mood: Anxious mood, elevated but appropriate Sensation: Equal and intact in BL UE and Les.  Reflexes: 2+ in R  UE and LE; 3-4 beats myoclonus left lower extremity. CN: 2-12 grossly intact.  Spasticity:  Left upper extremity: MAS 2 shoulder abductors, elbow flexors, elbow extensors; MAS 2 wrist flexors; MAS 1 finger flexors (c/b motor apraxia and flexion synergy), MAS 1 thumb adductors  Left lower extremity: MAS 2 hip flexors, MAS 2 knee extensors, MAS 1 knee flexors, MAS 3 plantar flexors.        Strength:                LUE: 0/5 SA, 2/5 EF, 3/5 EE, 1/5 WE, 3/5 FF, 2/5 FA--improving                RUE:  5/5 SA, 5/5 EF, 5/5 EE, 5/5 WE, 5/5 FF, 5/5 FA                LLE: 4/5 HF, 3/5 KE, 0/5  DF, 0/5  EHL, 1+/5  PF                 RLE:  5/5 HF, 5/5 KE, 5/5  DF, 5/5  EHL, 5/5  PF       Assessment/Plan: 1. Functional deficits which require 3+ hours per day of interdisciplinary therapy in a comprehensive  inpatient rehab setting. Physiatrist is providing close team supervision and 24 hour management of active medical problems listed below. Physiatrist and rehab team continue to assess barriers to discharge/monitor patient progress toward functional and medical goals  Care Tool:  Bathing    Body parts bathed by patient: Left arm, Chest, Abdomen, Front perineal area, Right upper leg, Left upper leg, Face   Body parts bathed by helper: Buttocks, Right lower leg, Left lower leg     Bathing assist Assist Level: Moderate Assistance - Patient 50 - 74%     Upper Body Dressing/Undressing Upper body dressing   What is the patient wearing?: Pull over shirt    Upper body assist Assist Level: Moderate Assistance - Patient 50 - 74%    Lower Body Dressing/Undressing Lower body dressing      What is the patient wearing?: Pants     Lower body assist Assist for lower body dressing: 2 Helpers      Toileting Toileting    Toileting assist Assist for toileting: Maximal Assistance - Patient 25 - 49%     Transfers Chair/bed transfer  Transfers assist     Chair/bed transfer assist level: Moderate Assistance - Patient 50 - 74%     Locomotion Ambulation   Ambulation assist      Assist level: 2 helpers Assistive device:  (Rt hand rail) Max distance: 5'   Walk 10 feet activity   Assist  Walk 10 feet activity did not occur: Safety/medical concerns (Fatigue)        Walk 50 feet activity   Assist Walk 50 feet with 2 turns activity did not occur: Safety/medical concerns         Walk 150 feet activity   Assist Walk 150 feet activity did not occur: Safety/medical concerns         Walk 10 feet on uneven surface  activity   Assist Walk 10 feet on uneven surfaces activity did not occur: Safety/medical concerns         Wheelchair     Assist Is the patient using a wheelchair?: Yes Type of Wheelchair: Manual    Wheelchair assist level: Dependent - Patient 0% Max wheelchair distance: 150'    Wheelchair 50 feet with 2 turns activity    Assist        Assist Level: Dependent - Patient 0%   Wheelchair 150 feet activity     Assist      Assist Level: Dependent - Patient 0%   Blood pressure 114/84, pulse 75, temperature 98.2 F (36.8 C), resp. rate 16, height 6\' 2"  (1.88 m), weight 76.8 kg, SpO2 98%.   Medical Problem List and Plan: 1. 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 Progressing well with therapies.    3-10: Looking into getting better PRAFO for positioning and to prevent contractures in left lower extremity.  Considering elbow bed sloe for positioning as well,  although patient has full range of motion here and is agreeable to trying to stretch in bed.  2.  Antithrombotics: -DVT/anticoagulation:  Pharmaceutical: Lovenox d/c'd 3/1             -  antiplatelet therapy: N/A   3. Pain Management:  - 2/27: Tylenol 350 to 650 mg every 6 hours for mild to moderate pain, tramadol 50 mg every 6 hours as needed for severe pain.  Has minimal complaints of pain so far, would consider gabapentin if additional needed.  3/3: Using tramadol approximately BID for lower back pain; with benefit.  4. Mood/Behavior/Sleep: Provide emotional support             -antipsychotic agents: N/A -2-27: Was using Xanax at home for sleep.  Has Atarax on board, add as needed melatonin and scheduled trazodone 100 mg nightly.  Will get sleep log.  - 2-28: Sleep improved with trazodone and as needed melatonin; continue.  -3/3: addition of BuSpar 5 mg 3 times daily for anxiety--improving 3/7: Increase BuSpar to 7.5 mg 3 times daily 3-10: Atarax DC'd due to making patient "jumpy"   5. Neuropsych/cognition: This patient is capable of making decisions on his own behalf. - reporting severe ongoing anxiety with staff, recommending neuropsychiatry and behavioral assessment as outpatient   - improving  6. Skin/Wound Care: Routine skin checks   -DC peripheral IV  7. Fluids/Electrolytes/Nutrition: Routine in and outs with follow-up chemistries   -Potassium slightly low 3.4, otherwise labs stable.  -3-3: AM labs stable  -09/03/23 pt requested vitamin D level for Mondays labs, ordered  3-10: Vitamin D low, start supplementation.  50,000 units once weekly for 4 doses.  8.  Tobacco use.  Continue [name]- consider discussion of decrease in dose given elevated blood pressure.  Provide counseling   9.  History of polysubstance use.  Urine drug screen positive marijuana.  Patient does endorse using Ativan and Percocet off the streets.  Provide counseling   10.  Constipation/diarrhea.   Continue Senokot S1 tablet twice daily.   -Multiple liquid stools overnight, large; DC Senokot  Improved -3-6: Increase to Senokot S1 tab twice daily, MiraLAX daily.  Add Anusol as needed.  -09/04/23 LBM yesterday, monitor  11.  LLE >> LUE Spasticity/myoclonus.  -Ordered left WHO today, may need custom per OT -Increase baclofen from 10 mg twice daily to 10 mg 3 times daily; continue Zanaflex 2 mg every 8 hours as needed for spasms -Monitor with therapy; would consider addition of antiepileptic if significant myoclonus -Discussed with patient this a.m., may need Botox exam and injection while in inpatient rehab for left finger flexor tightness, will monitor 1 week on medication prior to pursuing -2-28: Tone improved with increased baclofen; per therapy reports, ongoing issues with LLE myoclonus, could benefit from Depakote for this and ongoing mood/behavior difficulties, but will hold off to see if any continued improvement over the weekend on current medications. 3/2: asked Trey Paula PT if longer foot rest can be applied to wheelchair as patient experiencing clonus with current and feels it is too small  3/3: Using tizanidine and baclofen with benefit 3-5: Myoclonus remains very limiting.  Will avoid Valium due to substance use history; start Depakote 125 mg twice daily.  Will monitor LFTs in next few days. 3-6: Myoclonus improved!  Continue Depakote at current dose, check LFTs on 3/7.  Tone remains severe, will increase baclofen to 15 mg 3 times daily tizanidine has been sedating. 3/7: LFTs normal, myoclonus with effort today but much easier to break and no longer occurring at rest.  Left upper extremity getting some finger and wrist extension back!  Will increase tizanidine to 4 mg 3 times daily and look at adjusting Depakote next week.  3/10 :  will look into getting Xeomen samples for injection tomorrow.  Patient agreeable.  12. Bradycardia/Incomplete bundle branch block - episodes of high 40s/low 50s  intermittent throughout hospitalization  - EKG today NSR 60 bpm, with QTC 440   - No obvious medication contributors - very rare s/e to tramadol  - If  symptomatic, could consider cardiology consult and holter monitor/ziopatch -HR reviewed and appears to have resolved, discussed that lower heart rate at night is physiological  - 3/2: cardiology consulted, discussed with patient that no changes recommended  - 3/4: No bradycardia or sleep apnea on overnight pulse ox  14. Nasal congestion: asked nursing to advise patient not to use home nasal spray as it is a vasoconstrictor and can increase stroke risk  -Using saline nose spray only, can continue  15. Dizziness/vertigo   - Scopalamine patch   - Meclizine 25 mg TID PRN   - 3/4: OT doing vestibular eval today--no peripheral vertigo  16. Left ankle roll.  No apparent deformity, negative [place] ankle rules for imaging. -Discussed with PT using Aircast for positioning and support given history of frequent ankle rolling -Not bothersome to the patient at this time   LOS: 12 days A FACE TO FACE EVALUATION WAS PERFORMED  Erik Santana 09/05/2023, 7:57 AM

## 2023-09-05 NOTE — Progress Notes (Signed)
 Physical Therapy Session Note  Patient Details  Name: Erik Santana. MRN: [id number] Date of Birth: 01/24/1991  Today's Date: 09/05/2023 PT Individual Time: 1308-6578 PT Individual Time Calculation (min): 72 min   Today's Date: 09/05/2023 PT Individual Time: 4696-2952 PT Individual Time Calculation (min): 42 min   Short Term Goals: Week 2:  PT Short Term Goal 1 (Week 2): Pt will complete sit to stand consistently with CGA. PT Short Term Goal 2 (Week 2): Pt will complete bed to chair consistently with minA. PT Short Term Goal 3 (Week 2): Pt will ambulate x25' with minA and LRAD.  Skilled Therapeutic Interventions/Progress Updates:     1st Session: Pt received seated in [facility] and agrees to therapy. No complaint of pain. WC transport gym. PT puts Lt AFO in shoe prior to mobility. Pt completes Kinetron at 30 cm/sec to provide hip extensor strengthening and NMR. PT provides cues for body mechanics and correct performance. Pt attempts to perform standing balance and Lt hemibody NMR activity, propping RLE on 6" step and with task of tossing bean bags at corn hole board. Pt unable to achieve static balance in stated setup, despite modA and cues for posture and body mechanics, as well as mirror provided for visual feedback. Pt instead transitions to high kneeling on mat placed on ground, facing high low mat and with LUE support on mat for loading through hemiplegic Lt hemibody. Pt performs lateral weight shifting in tall kneeling, with PT providing modA at hips for neutral posture. Pt then transitions to kneeling stance with Rt foot flat on mat to promote increased Lt sided WB. PT provides cues throughout for posture and correct performance. Pt transitions back to mat with  modA and cues for sequencing. Following rest break, pt stands with miNA and ambulates x15' with modA and cues for reciprocal gait pattern, lateral weight shifting, and upright posture. Left seated in WC with all needs within reach.    2nd Session: Pt received seated in [facility] and agrees to therapy. No complaint of pain. Pt self propels WC to gym with RUE and RLE, with cues for safe propulsion and body mechanics. Pt performs stand pivot transfer to bed with CGA and cues for sequencing and positioning. Pt transitions to supine with minA and cues for positioning. Pt performs x15 glute sets for NMR, with cues for correct performance. Pt attempts SAQs but lacks motor control to complete effectively. Pt transitions to prone and attempts hamstring curls but again has difficulty with effectively motor planning movement, instead activating glutes. Pt transitions back to supine and performs 3x15 single leg bridges with LLE, with RLE lifted from mat to prevent compensation. PT provides cues for body mechanics and correct performance. Following rest break, pt stands with miNA and ambulates x30' with modA and cues for reciprocal gait pattern, lateral weight shifting, and upright posture. Pt left seated in WC with alarm intact and all needs within reach.   Therapy Documentation Precautions:  Precautions Precautions: Fall Precaution/Restrictions Comments: left LE tone Restrictions Weight Bearing Restrictions Per Provider Order: No   Therapy/Group: Individual Therapy  [name], PT, DPT 09/05/2023, 3:53 PM

## 2023-09-06 ENCOUNTER — Inpatient Hospital Stay (HOSPITAL_COMMUNITY)

## 2023-09-06 ENCOUNTER — Ambulatory Visit: Payer: Self-pay | Admitting: Family

## 2023-09-06 MED ORDER — ZOLPIDEM TARTRATE 5 MG PO TABS
5.0000 mg | ORAL_TABLET | Freq: Every evening | ORAL | Status: DC | PRN
  Administered 2023-09-07 – 2023-09-21 (×14): 5 mg via ORAL
  Filled 2023-09-06 (×14): qty 1

## 2023-09-06 MED ORDER — VITAMIN D (ERGOCALCIFEROL) 1.25 MG (50000 UNIT) PO CAPS
50000.0000 [IU] | ORAL_CAPSULE | ORAL | Status: DC
  Administered 2023-09-06 – 2023-09-20 (×3): 50000 [IU] via ORAL
  Filled 2023-09-06 (×3): qty 1

## 2023-09-06 MED ORDER — DIVALPROEX SODIUM 125 MG PO CSDR
125.0000 mg | DELAYED_RELEASE_CAPSULE | Freq: Three times a day (TID) | ORAL | Status: AC
Start: 2023-09-06 — End: 2023-09-13
  Administered 2023-09-06 – 2023-09-13 (×23): 125 mg via ORAL
  Filled 2023-09-06 (×23): qty 1

## 2023-09-06 NOTE — Progress Notes (Signed)
 Occupational Therapy Session Note  Patient Details  Name: Erik Santana. MRN: [id number] Date of Birth: 1990/12/29  Today's Date: 09/06/2023 OT Individual Time: 0810-0902 OT Individual Time Calculation (min): 52 min    Short Term Goals: Week 2:  OT Short Term Goal 1 (Week 2): Pt will transfer to toilet toward the R with min A OT Short Term Goal 2 (Week 2): Pt will don pants with min A OT Short Term Goal 3 (Week 2): Pt will complete UB bathing with CGA OT Short Term Goal 4 (Week 2): Pt will position LUE appropriately at rest with no cueing  Skilled Therapeutic Interventions/Progress Updates:    Pt received supine with no c/o pain, agreeable to OT session. Pt complaining about fit of new PRAFO. OT attempted to don again to assess fit. Recommend he share with nursing that knee and hip be bent when donning. He completed bed mobility to EOB with (S). He was able to don a shirt with CGA. Demo provided re one handed sock donning technique and he was able to return demo to don R sock with (S) as well as min A with L to stabilize leg when propped up. He was able to self facilitate L hand reaching and grasping w/c arm rest to complete a squat pivot toward the L with min A. He propelled the w/c to the therapy gym, 100 ft, with hemi technique with extra time. He completed multiple sets, 50+ ft of functional mobility with the R hand rail initially, progressing to a quad cane, cueing and focus on functional mobility progression for carryover to ADL transfers. He required min facilitation for reducing circumduction and abduction at the hip, as well as blocking posteriorly to reduce knee hyperextension. He required cueing and min facilitation at the trunk to encourage L weight shift and reduce trunk compensation. He returned to his room following. Pt was left sitting up in the w/c with all needs met and call bell within reach.   Therapy Documentation Precautions:  Precautions Precautions:  Fall Precaution/Restrictions Comments: left LE tone Restrictions Weight Bearing Restrictions Per Provider Order: No  Therapy/Group: Individual Therapy  Crissie Reese 09/06/2023, 6:22 AM

## 2023-09-06 NOTE — Progress Notes (Signed)
 Patient ID: Erik Hai., male   DOB: 06-25-1991, 33 y.o.   MRN: [id number]  SW met with pt in room to provide updates from team conference and d/c date remains 3/27. SW updated on now having insurance.   Erik Santana, MSW, LCSW Office: [phone] Cell: [phone] Fax: [phone]

## 2023-09-06 NOTE — Progress Notes (Signed)
 PROGRESS NOTE   Subjective/Complaints:  Patient seen and examined in therapy gym, complaining of severe ongoing anxiety.  States that he is not sleeping at all at night, trazodone no longer helpful.  Discussed importance of avoidance of narcotic medications given his history, patient later denies any Ativan use off the streets and says "I was just telling them that I was prescribed that as a kid".  Discussed with patient that treatment for anxiety requires long-term medication adjustments that may not be appreciated while in rehab.  After considerable back-and-forth, agreeable to trial of as needed zolpidem at night, with understanding that this will not be prescribed or continued as an outpatient.  Ongoing severe clonus in left lower extremity, and ankle tone with passive range of motion.  He states that he continues to have significant pain on the outer part of his ankle, but does not think it is broken.  ROS: Denies fevers, chills, N/V, abdominal pain, diarrhea, SOB, cough, chest pain, new weakness or paraesthesias.    + insomnia/anxiety-ongoing, severe + vertigo--improving + LUE and LLE myoclonus-intermittent, ongoing + Spasticity -ongoing/improving + constipation--improved  Objective:   No results found. Recent Labs    09/05/23 1134  WBC 6.5  HGB 15.5  HCT 45.3  PLT 417*     Recent Labs    09/05/23 1134  NA 137  K 4.1  CL 104  CO2 25  GLUCOSE 91  BUN 11  CREATININE 1.01  CALCIUM 9.3      Intake/Output Summary (Last 24 hours) at 09/06/2023 0949 Last data filed at 09/06/2023 6578 Gross per 24 hour  Intake 576 ml  Output 900 ml  Net -324 ml        Physical Exam: Vital Signs Blood pressure 113/64, pulse 77, temperature 97.8 F (36.6 C), temperature source Oral, resp. rate 17, height 6\' 2"  (1.88 m), weight 78.7 kg, SpO2 98%.  Constitutional: No apparent distress. Appropriate appearance for age.  Working  in therapy gym. HENT: Atraumatic, normocephalic.  Eyes: PERRLA. EOMI. no obvious vertigo on exam.    Cardiovascular: Mildly tachycardic, regular rhythm, no murmurs/rub/gallops. No Edema. Peripheral pulses 2+  Respiratory: CTAB. No rales, rhonchi, or wheezing. On RA.  Abdomen: + bowel sounds, normoactive. No distention or tenderness.  Skin: C/D/I.   Psych: Anxious, but appropriate.  MSK:     Left ankle  no appreciable bruising on the anterior lateral aspect; no apparent deformity.  Tolerates passive range of motion.      Neurologic exam:  Cognition: AAO to person, place, time and event.  No apparent cognitive deficits. Insight: Good insight into current condition.  Mood: Anxious mood, elevated but appropriate Sensation: Equal and intact in BL UE and Les.  Reflexes: 2+ in R  UE and LE; continuous myoclonus in left lower extremity with passive dorsiflexion.. CN: 2-12 grossly intact.  Spasticity:  Left upper extremity: MAS 1 shoulder abductors, MAS 1+ elbow flexors, MAS 1 elbow extensors; MAS 1 wrist flexors; MAS 0 finger flexors , MAS 0 thumb adductors  Left lower extremity: MAS 1 hip flexors, MAS 0 knee extensors, MAS 2 knee flexors, MAS 3-4 plantar flexors.        Strength:  LUE: 1/5 SA, 2/5 EF, 3/5 EE, 1/5 WE, 3/5 FF, 2/5 FA                 RUE:  5/5 SA, 5/5 EF, 5/5 EE, 5/5 WE, 5/5 FF, 5/5 FA                LLE: 4/5 HF, 2/5 KE, 0/5  DF, 0/5  EHL, 1/5  PF                 RLE:  5/5 HF, 5/5 KE, 5/5  DF, 5/5  EHL, 5/5  PF       Assessment/Plan: 1. Functional deficits which require 3+ hours per day of interdisciplinary therapy in a comprehensive inpatient rehab setting. Physiatrist is providing close team supervision and 24 hour management of active medical problems listed below. Physiatrist and rehab team continue to assess barriers to discharge/monitor patient progress toward functional and medical goals  Care Tool:  Bathing    Body parts bathed by patient: Left  arm, Chest, Abdomen, Front perineal area, Right upper leg, Left upper leg, Face, Right arm, Right lower leg, Left lower leg, Buttocks   Body parts bathed by helper: Buttocks, Right lower leg, Left lower leg     Bathing assist Assist Level: Minimal Assistance - Patient > 75%     Upper Body Dressing/Undressing Upper body dressing   What is the patient wearing?: Pull over shirt    Upper body assist Assist Level: Minimal Assistance - Patient > 75%    Lower Body Dressing/Undressing Lower body dressing      What is the patient wearing?: Pants     Lower body assist Assist for lower body dressing: Moderate Assistance - Patient 50 - 74%     Toileting Toileting    Toileting assist Assist for toileting: Moderate Assistance - Patient 50 - 74%     Transfers Chair/bed transfer  Transfers assist     Chair/bed transfer assist level: Moderate Assistance - Patient 50 - 74%     Locomotion Ambulation   Ambulation assist      Assist level: 2 helpers Assistive device:  (Rt hand rail) Max distance: 5'   Walk 10 feet activity   Assist  Walk 10 feet activity did not occur: Safety/medical concerns (Fatigue)        Walk 50 feet activity   Assist Walk 50 feet with 2 turns activity did not occur: Safety/medical concerns         Walk 150 feet activity   Assist Walk 150 feet activity did not occur: Safety/medical concerns         Walk 10 feet on uneven surface  activity   Assist Walk 10 feet on uneven surfaces activity did not occur: Safety/medical concerns         Wheelchair     Assist Is the patient using a wheelchair?: Yes Type of Wheelchair: Manual    Wheelchair assist level: Dependent - Patient 0% Max wheelchair distance: 150'    Wheelchair 50 feet with 2 turns activity    Assist        Assist Level: Dependent - Patient 0%   Wheelchair 150 feet activity     Assist      Assist Level: Dependent - Patient 0%   Blood  pressure 113/64, pulse 77, temperature 97.8 F (36.6 C), temperature source Oral, resp. rate 17, height 6\' 2"  (1.88 m), weight 78.7 kg, SpO2 98%.   Medical Problem List and Plan: 1. 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 Progressing well with therapies.    3-10: Looking into getting better PRAFO for positioning and to prevent contractures in left lower extremity.  Considering elbow bed sloe for positioning as well, although patient has full range of motion here and is agreeable to trying to stretch in bed.  3/11: Min A UB ADLs, Mod A LBD, is getting bicep movement and deltoid 1/5 now. With PT Min A transfers, Mod A ambulating 30 ft., progressing L leg independently. Limited by tone.   2.  Antithrombotics: -DVT/anticoagulation:  Pharmaceutical: Lovenox d/c'd 3/1             -antiplatelet therapy: N/A   3. Pain Management:  - 2/27: Tylenol 350 to 650 mg every 6 hours for mild to moderate pain, tramadol 50 mg every 6 hours as needed for severe pain.  Has minimal complaints of pain so far, would consider gabapentin if additional needed.  3/3: Using tramadol approximately BID for lower back pain; with benefit.  4. Mood/Behavior/Sleep: Provide emotional support             -antipsychotic agents: N/A -2-27: Was using Xanax at home for sleep.  Has Atarax on board, add as needed melatonin and scheduled trazodone 100 mg nightly.  Will get sleep log.  - 2-28: Sleep improved with trazodone and as needed melatonin; continue.  -3/3: addition of BuSpar 5 mg 3 times daily for anxiety--improving 3/7: Increase BuSpar to 7.5 mg 3 times daily 3-10: Atarax DC'd due to making patient "jumpy" 3-11: Too early to [employer], patient continues to  complain of significant anxiety and no sleep.  After considerable back-and-forth discussion, agreed to zolpidem 5 mg nightly as needed while inpatient.  Patient understands this will not be prescribed at discharge.   5. Neuropsych/cognition: This patient is capable of making decisions on his own behalf. - reporting severe ongoing anxiety with staff, recommending neuropsychiatry and behavioral assessment as outpatient   - 3-11: Remains in significant anxiety; of note, denies history of drug use outside of marijuana.  6. Skin/Wound Care: Routine skin checks   -DC peripheral IV  7. Fluids/Electrolytes/Nutrition: Routine in and outs with follow-up chemistries   -Potassium slightly low 3.4, otherwise labs stable.  -3-3: AM labs stable  -09/03/23 pt requested vitamin D level for Mondays labs, ordered  3-10: Vitamin D low, start supplementation.  50,000 units once weekly for 4 doses.  8.  Tobacco use.  Continue [name]- consider discussion of decrease in dose given elevated blood pressure.  Provide counseling   9.  History of polysubstance use.  Urine drug screen positive marijuana.  Patient does endorse using Ativan and Percocet off the streets.  Provide counseling  -3-11: Patient states he in fact was not taking Ativan or Percocet off the streets, that he had been prescribed these earlier in life and was endorsing that when asked.  Wishes the record to reflect this answer.  He does not deny recreational marijuana use.   10.  Constipation/diarrhea.  Continue Senokot S1 tablet twice daily.   -Multiple liquid  stools overnight, large; DC Senokot  Improved -3-6: Increase to Senokot S1 tab twice daily, MiraLAX daily.  Add Anusol as needed.  -09/06/23 LBM yesterday, monitor  11.  LLE >> LUE Spasticity/myoclonus.  -Ordered left WHO today, may need custom per OT -Increase baclofen from 10 mg twice daily to 10 mg 3 times daily; continue Zanaflex 2 mg every 8 hours as needed for spasms -Monitor with  therapy; would consider addition of antiepileptic if significant myoclonus -Discussed with patient this a.m., may need Botox exam and injection while in inpatient rehab for left finger flexor tightness, will monitor 1 week on medication prior to pursuing -2-28: Tone improved with increased baclofen; per therapy reports, ongoing issues with LLE myoclonus, could benefit from Depakote for this and ongoing mood/behavior difficulties, but will hold off to see if any continued improvement over the weekend on current medications. 3/2: asked Trey Paula PT if longer foot rest can be applied to wheelchair as patient experiencing clonus with current and feels it is too small  3/3: Using tizanidine and baclofen with benefit 3-5: Myoclonus remains very limiting.  Will avoid Valium due to substance use history; start Depakote 125 mg twice daily.  Will monitor LFTs in next few days. 3-6: Myoclonus improved!  Continue Depakote at current dose, check LFTs on 3/7.  Tone remains severe, will increase baclofen to 15 mg 3 times daily tizanidine has been sedating. 3/7: LFTs normal, myoclonus with effort today but much easier to break and no longer occurring at rest.  Left upper extremity getting some finger and wrist extension back!  Will increase tizanidine to 4 mg 3 times daily and look at adjusting Depakote next week.  3/10 : will look into getting Xeomen samples for injection tomorrow.  Patient agreeable.  3-11: Increase Depakote to 2025 mg every 8 hours.  Perform Xeomin injection as below after discussion of risks and benefits with patient, patient consent and timeout.  The below mentioned sites were cleaned and prepped with alcohol swabs, and muscles were identified by anatomical landmarks and confirmed with use of EMG and stim.  Patient tolerated procedure well without bleeding or side effect.   LLE:   50 units medial gastrocnemius   50 units lateral gastrocnemius   50 units soleus   50 units lateral hamstrings  12.  Bradycardia/Incomplete bundle branch block - episodes of high 40s/low 50s intermittent throughout hospitalization  - EKG today NSR 60 bpm, with QTC 440   - No obvious medication contributors - very rare s/e to tramadol  - If  symptomatic, could consider cardiology consult and holter monitor/ziopatch -HR reviewed and appears to have resolved, discussed that lower heart rate at night is physiological  - 3/2: cardiology consulted, discussed with patient that no changes recommended  - 3/4: No bradycardia or sleep apnea on overnight pulse ox  14. Nasal congestion: asked nursing to advise patient not to use home nasal spray as it is a vasoconstrictor and can increase stroke risk  -Using saline nose spray only, can continue  15. Dizziness/vertigo   - Scopalamine patch   - Meclizine 25 mg TID PRN   - 3/4: OT doing vestibular eval today--no peripheral vertigo  3/11: improving  16. Left ankle roll.  No apparent deformity, negative [place] ankle rules for imaging. -Discussed with PT using Aircast for positioning and support given history of frequent ankle rolling -Not bothersome to the patient at this time - 3/11: pain ongoing; xray ordered--patient refused   LOS: 13 days A FACE TO FACE  EVALUATION WAS PERFORMED  Angelina Sheriff 09/06/2023, 9:49 AM

## 2023-09-06 NOTE — Plan of Care (Signed)
  Problem: RH BOWEL ELIMINATION Goal: RH STG MANAGE BOWEL WITH ASSISTANCE Description: STG Manage Bowel with mod I  Assistance. Outcome: Progressing   Problem: RH SAFETY Goal: RH STG ADHERE TO SAFETY PRECAUTIONS W/ASSISTANCE/DEVICE Description: STG Adhere to Safety Precautions With cues Assistance/Device. Outcome: Progressing   Problem: RH PAIN MANAGEMENT Goal: RH STG PAIN MANAGED AT OR BELOW PT'S PAIN GOAL Description: < 4 with prns Outcome: Progressing

## 2023-09-06 NOTE — Plan of Care (Signed)
  Problem: Consults Goal: RH STROKE PATIENT EDUCATION Description: See Patient Education module for education specifics  Outcome: Progressing   Problem: RH BOWEL ELIMINATION Goal: RH STG MANAGE BOWEL WITH ASSISTANCE Description: STG Manage Bowel with mod I Assistance. Outcome: Progressing Goal: RH STG MANAGE BOWEL W/MEDICATION W/ASSISTANCE Description: STG Manage Bowel with Medication with  mod I Assistance. Outcome: Progressing   Problem: RH SAFETY Goal: RH STG ADHERE TO SAFETY PRECAUTIONS W/ASSISTANCE/DEVICE Description: STG Adhere to Safety Precautions With cues Assistance/Device. Outcome: Progressing   Problem: RH PAIN MANAGEMENT Goal: RH STG PAIN MANAGED AT OR BELOW PT'S PAIN GOAL Description: < 4 with prns Outcome: Progressing   Problem: RH KNOWLEDGE DEFICIT Goal: RH STG INCREASE KNOWLEDGE OF HYPERTENSION Description: Patient and family will be able to manage HTN with medications and educational resources for dietary modification, lifestyle modification independently. Outcome: Progressing Goal: RH STG INCREASE KNOWLEGDE OF HYPERLIPIDEMIA Description: Patient and family will be able to manage HLD with medications and educational resources for dietary modification, lifestyle modification independently. Outcome: Progressing Goal: RH STG INCREASE KNOWLEDGE OF STROKE PROPHYLAXIS Description: Patient and family will be able to manage secondary risks with medications and educational resources for dietary modification, lifestyle modification independently. Outcome: Progressing

## 2023-09-06 NOTE — Progress Notes (Signed)
 Occupational Therapy Session Note  Patient Details  Name: Erik Santana. MRN: [id number] Date of Birth: 29-Sep-1990  Today's Date: 09/06/2023 OT Individual Time: 1300-1345 OT Individual Time Calculation (min): 45 min    Short Term Goals: Week 2:  OT Short Term Goal 1 (Week 2): Pt will transfer to toilet toward the R with min A OT Short Term Goal 2 (Week 2): Pt will don pants with min A OT Short Term Goal 3 (Week 2): Pt will complete UB bathing with CGA OT Short Term Goal 4 (Week 2): Pt will position LUE appropriately at rest with no cueing Week 3:     Skilled Therapeutic Interventions/Progress Updates:    1:1 NMR with focus on squat pivot transfers (staying low and flexed), donning shoes with AFO. Transferred to mat into supine and focus on PNF with assistance and transitioning in and out of flexion and extension from shoulder to hand with visual attention to hand during movements. Pt able to relax some of the tone and performed better with targets to reach for. Pt left with PT for next session/   Therapy Documentation Precautions:  Precautions Precautions: Fall Precaution/Restrictions Comments: left LE tone Restrictions Weight Bearing Restrictions Per Provider Order: No  Pain:  No c/o pain but c/o anxiety and requested meds for anxiety   Therapy/Group: Individual Therapy  Roney Mans [facility] 09/06/2023, 3:24 PM

## 2023-09-06 NOTE — Patient Care Conference (Signed)
 Inpatient RehabilitationTeam Conference and Plan of Care Update Date: 09/06/2023   Time: 1006 am    Patient Name: Erik Santana.      Medical Record Number: [id number]  Date of Birth: Nov 12, 1990 Sex: Male         Room/Bed: 4W01C/4W01C-01 Payor Info: Payor: [place] MEDICAID PREPAID HEALTH PLAN / Plan: [place] MEDICAID [facility] / Product Type: *No Product type* /    Admit Date/Time:  08/24/2023  3:07 PM  Primary Diagnosis:  Intraparenchymal hematoma of brain [facility])  Hospital Problems: Principal Problem:   Intraparenchymal hematoma of brain [facility]) Active Problems:   Cognitive change    Expected Discharge Date: Expected Discharge Date: 09/22/23  Team Members Present: Physician leading conference: Dr. Elijah Birk Social Worker Present: Cecile Sheerer, LCSWA Nurse Present: Konrad Dolores, RN PT Present: Malachi Pro, PT OT Present: Jake Shark, OT SLP Present: Other (comment) Alvera Novel SLP) PPS Coordinator present : Fae Pippin, SLP     Current Status/Progress Goal Weekly Team Focus  Bowel/Bladder   Continent B/B  LBM 09/04/23   Will remain continent B/B with normal pattern   Assist with toilet needs qshift/prn    Swallow/Nutrition/ Hydration               ADL's   Min A UB and LB ADLs. mod A transfers when reducing compensatory techniques, LUE with gravity eliminated 20 degrees bicep activation, full hand flexion, finger 1-2 slight extension, deltoids firing, no active movement.   (S) to min A   LUE NMR, tone management, ADls, transfers, trunk control/standing balance    Mobility   supervision bed mobility, minA sit to stand, minA stand pivot transfers, modA x30' without AD   supervision transfers, minA ambulation  Lt hemibody NMR, transfers, ambulation, coordination    Communication                Safety/Cognition/ Behavioral Observations               Pain   Verbalizes pain to left ankle. Pain well managed with PRN pain medications   Will be free from  pain   Assess pain qshift/prn and provide education on pain medication    Skin   Skin is currently intact   Will maintain skin intergrity without breakdown  Assess skin for breakdown qshift/prn and provide education to prevent breakdown      Discharge Planning:  Pt now has managed Medicaid-[employer].  Pt will discharge to home with his mother who will be primary caregiver. Pt mother works first shift; pt sister to help as she works second shift. per mother, sister will stop working and move to 3rd shift if needed. SW will confirm there are no barriers to discharge.   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 Patient limited by anxiety, pain, myoclonus and spasticity.  Patient on target to meet rehab goals: yes, Patient requires minimal assistance with ADLs . Patient requires minimal assistance with stand pivot transfers and moderate assistance with ambulation up to 30' without assistive device. Overall goals are set at minimal assistance on discharge.  *See Care Plan and progress notes for long and short-term goals.   Revisions to Treatment Plan:  Orthotics consult for prafo  Teaching Needs: Safety, medications, toileting , transfers, dietary recommendations,polysubstance and tobacco cessation education, etc.   Current Barriers to Discharge: Decreased caregiver support  Possible Resolutions to Barriers: Family education      Medical Summary Current Status: Medically complicated by  ongoing low back pain, left ankle pain, left hemiparesis, spasticity, myoclonus, anxiety, insomnia, and intermittent bradycardia  Barriers to Discharge: Behavior/Mood;Medical stability;Uncontrolled Pain;Self-care education;Spasticity;Cardiac Complications   Possible Resolutions to [employer] Focus: Medication management for spasticity and myoclonus with possible Xeomin injection this week, pain control with minimal  tolerable dosing of narcotic medications, psychological support and medication adjustment for anxiety/insomnia, x-rays for left ankle pain workup   Continued Need for Acute Rehabilitation Level of Care: The patient requires daily medical management by a physician with specialized training in physical medicine and rehabilitation for the following reasons: Direction of a multidisciplinary physical rehabilitation program to maximize functional independence : Yes Medical management of patient stability for increased activity during participation in an intensive rehabilitation regime.: Yes Analysis of laboratory values and/or radiology reports with any subsequent need for medication adjustment and/or medical intervention. : Yes   I attest that I was present, lead the team conference, and concur with the assessment and plan of the team.   Gwenyth Allegra 09/06/2023, 2:51 PM

## 2023-09-06 NOTE — Progress Notes (Signed)
 Physical Therapy Session Note  Patient Details  Name: Erik Santana. MRN: [id number] Date of Birth: June 06, 1991  Today's Date: 09/06/2023 PT Individual Time: 6578-4696 PT Individual Time Calculation (min): 43 min   Today's Date: 09/06/2023 PT Individual Time: 2952-8413 PT Individual Time Calculation (min): 58 min   Short Term Goals: Week 2:  PT Short Term Goal 1 (Week 2): Pt will complete sit to stand consistently with CGA. PT Short Term Goal 2 (Week 2): Pt will complete bed to chair consistently with minA. PT Short Term Goal 3 (Week 2): Pt will ambulate x25' with minA and LRAD.  Skilled Therapeutic Interventions/Progress Updates:     1st Session: Pt received seated in [facility] and agrees to therapy. WC transport to gym for time management. Pt performs stand pivot to mat table with minA and cues for sequencing and positioning. PT provides education on RW and potential benefits of ambulation with RW. Pt agreeable to try. Pt performs sit to stand to RW with cues for hand placement and sequencing, with minA provided. PT then ambulates x100' with RW and modA, with PT providing manual assistance to ensure that RW does not lift off ground due to pt's LUE flexor tone. PT provides cues for lateral weight shifting, light tactile cueing at Lt popliteal fossa to prevent hyperextension during stance phase, and maintaining neutral posture. WC transport back to room. Pt left seated in WC with all needs within reach.   2nd Session: Pt received as handoff from OT in dayroom. Reports pain in back. PT provides rest breaks and gentle exercises to manage pain. Pt performs sit to stand from [facility] with RW and cues for hand placement and body mechanics. Pt attempts ambulation with RW, but has difficulty maintaining neutral posture and preventing trunk compensations, so RW is dispensed for gait training with PT positioned under pt's LUE. Pt ambulates x60' with modA overall, with cues for lateral weight shifting, light tactile  cueing at Lt popliteal fossa to prevent hyperextension during stance phase, and maintaining neutral posture. Pt transitions to supine and performs bilateral lower extremity bridges for coordination and NMR, as well as managing back pain. Pt completes 3x15 with verbal and tactile cues for correct performance. Pt then performs 3x5 SAQs with tactile cueing at quad for improved contraction. Pt performs x10 heel slides with AAROM to promote optimal motor planning and performance. Supine to sit with minA. Pt performs squat pivot from bed to WC with minA and cues for body mechanics. Left seated with alarm intact and all needs within reach.   Therapy Documentation Precautions:  Precautions Precautions: Fall Precaution/Restrictions Comments: left LE tone Restrictions Weight Bearing Restrictions Per Provider Order: No    Therapy/Group: Individual Therapy  [name], PT, DPT 09/06/2023, 4:36 PM

## 2023-09-07 NOTE — Progress Notes (Signed)
 Patient ID: Nedra Hai., male   DOB: [birth year], 33 y.o.   MRN: [id number]  SW sent demo sheets to [employer] and [employer].  Cecile Sheerer, MSW, LCSW Office: [phone] Cell: [phone] Fax: [phone]

## 2023-09-07 NOTE — Progress Notes (Signed)
 Physical Therapy Session Note  Patient Details  Name: Erik Santana. MRN: [id number] Date of Birth: 04/21/1991  Today's Date: 09/07/2023 PT Individual Time: 0830-0928 PT Individual Time Calculation (min): 58 min   Today's Date: 09/07/2023 PT Individual Time: 4098-1191 PT Individual Time Calculation (min): 42 min   Short Term Goals: Week 2:  PT Short Term Goal 1 (Week 2): Pt will complete sit to stand consistently with CGA. PT Short Term Goal 2 (Week 2): Pt will complete bed to chair consistently with minA. PT Short Term Goal 3 (Week 2): Pt will ambulate x25' with minA and LRAD.  Skilled Therapeutic Interventions/Progress Updates:     Pt received seated in [facility] and agrees to therapy. No complaint of pain. WC transport to gym for time management. PT demonstrates squat pivot technique and then pt completes with CGA and cues for sequencing and body mechanics. Session emphasizes NMR through Lt hemibody with platform placed in between pt's legs on high low mat. Pt places LUE on platform and Rt hand over Lt hand for manual over pressure. Pt performs repeated reps of sit to squat position with PT providing minA at hips to promote desired symmetrical WB, as well as providing tactile input at Lt distal thigh for NM feedback and improved contraction. Pt completes 5x10 reps with 10 count hold on final rep. Pt performs squat pivot back to WC with CGA and same cues. Left seated in WC with alarm intact and all needs within reach.   2nd Session: Pt received seated in [facility] and agrees to therapy. No complaint of pain. WC transport to gym. Pt completes Wii bowling activity to provide dynamic standing activity with gross motor challenge and coordination training. Pt performs multiple reps of sit to stand with minA and cues for body mechanics an optimal sequencing. In standing, pt performs large amplitude, dynamic movements with RUE and PT facilitates stability and Lt lateral weight shifting, with tactile cueing at  popliteal fossa to promote slight knee flexion and increased loading through Rt quadriceps muscle. Pt able to stand >5 minutes at a time prior to taking rest breaks. PT provides cueing for hip flexion and sequencing for return to seated after each bout of standing. WC transport back to room. Left seated in WC with alarm intact and all needs within reach.   Therapy Documentation Precautions:  Precautions Precautions: Fall Precaution/Restrictions Comments: left LE tone Restrictions Weight Bearing Restrictions Per Provider Order: No   Therapy/Group: Individual Therapy  [name], PT, DPT 09/07/2023, 4:09 PM

## 2023-09-07 NOTE — Progress Notes (Signed)
 Occupational Therapy Session Note  Patient Details  Name: Erik Santana. MRN: [id number] Date of Birth: [birth year]  Session 1  Today's Date: 09/07/2023 OT Individual Time: 4098-1191 OT Individual Time Calculation (min): 45 min   Session 2  Today's Date: 09/07/2023 OT Individual Time: 1100-1200 OT Individual Time Calculation (min): 60 min    Short Term Goals: Week 2:  OT Short Term Goal 1 (Week 2): Pt will transfer to toilet toward the R with min A OT Short Term Goal 2 (Week 2): Pt will don pants with min A OT Short Term Goal 3 (Week 2): Pt will complete UB bathing with CGA OT Short Term Goal 4 (Week 2): Pt will position LUE appropriately at rest with no cueing  Skilled Therapeutic Interventions/Progress Updates:    Session 1 Pt received supine with no c/o pain, agreeable to OT session. Provided PROM to LLE- extensor tone very difficult to break but eventually able to bring knee into flexion. He reported increased PRAFO tolerance last night. He came to EOB with (S). B shoes and socks and L AFO donned with max A for time management. He completed functional mobility into the bathroom with large base quad cane with mod A overall, mod cueing for threshold management. He transferred into shower onto TTB. Discussed d/c planning at length during shower. Pt able to doff all clothing, including socks and shoes today with CGA for sitting balance. He completed LB bathing seated with lateral leans with CGA, UB with (S) using adaptive strategies and a LH sponge. He stood from the shower chair without shoe/AFO on and his ankle showed signs of rolling so shoes and AFO were donned prior to functional mobility back to the w/c, min A. Once in the w/c he donned a shirt with (S), pants with mod A. He was left sitting up with all needs met, assisted with breakfast set up.   Session 2 Pt received supine with no c/o pain, agreeable to OT session. He came to EOB with (S), shoes already donned. He completed  functional mobility into the bathroom with min A overall, several instances of mod A, using a large base quad cane. He was unable to void in standing. He completed 120 ft of functional mobility with the cane with mod facilitation at the trunk/hip to reduce compensations and mod facilitation at the L LE to prevent circumduction and knee hyperextension. He took several rest breaks in standing. Clonus present throughout. He then transferred onto the mat and worked on several transitional movements, supine > prone > supported side plank- all to address LUE weightbearing/integration into movement and NMR. E-stim was placed on his tricep throughout movement. Max facilitation at the L shoulder required during all weightbearing activities. He completed isolated bicep flex/ext with e-stim alternating bicep/tricep with max facilitation. He returned to his room via w/c. Pt was left sitting up in the w/c with all needs met and call bell within reach.     Therapy Documentation Precautions:  Precautions Precautions: Fall Precaution/Restrictions Comments: left LE tone Restrictions Weight Bearing Restrictions Per Provider Order: No  Therapy/Group: Individual Therapy  Crissie Reese 09/07/2023, 6:25 AM

## 2023-09-07 NOTE — Progress Notes (Signed)
 Orthopedic Tech Progress Note Patient Details:  Erik Santana [birth year] [id number] AFO Consult has been ordered from [facility]  Patient ID: Erik Hai., male   DOB: Sep 03, 1990, 33 y.o.   MRN: [id number]  Erik Santana 09/07/2023, 9:34 AM

## 2023-09-07 NOTE — Progress Notes (Signed)
 PROGRESS NOTE   Subjective/Complaints:  Did not use as needed zolpidem overnight.  Patient states he slept very well. Last as needed utilization notes from yesterday night. States myoclonus is much better today. Single overnight hypertension, daytime vitals are stable.  Will get orthostats to evaluate.  R S: Denies fevers, chills, N/V, abdominal pain, diarrhea, SOB, cough, chest pain, new weakness or paraesthesias.    + insomnia/anxiety-slightly improved + vertigo--improving + LUE and LLE myoclonus-intermittent, better today + Spasticity -ongoing/improving + constipation--improved  Objective:   No results found. Recent Labs    09/05/23 1134  WBC 6.5  HGB 15.5  HCT 45.3  PLT 417*     Recent Labs    09/05/23 1134  NA 137  K 4.1  CL 104  CO2 25  GLUCOSE 91  BUN 11  CREATININE 1.01  CALCIUM 9.3      Intake/Output Summary (Last 24 hours) at 09/07/2023 0849 Last data filed at 09/06/2023 2315 Gross per 24 hour  Intake 580 ml  Output 1200 ml  Net -620 ml        Physical Exam: Vital Signs Blood pressure 118/87, pulse 66, temperature 98.3 F (36.8 C), resp. rate 16, height 6\' 2"  (1.88 m), weight 78.7 kg, SpO2 99%.  Constitutional: No apparent distress. Appropriate appearance for age.  Sitting up in therapy gym. HENT: Atraumatic, normocephalic.  Eyes: PERRLA. EOMI. no obvious vertigo on exam.   Cardiovascular: Regular rate, regular rhythm, no murmurs/rub/gallops. No Edema. Peripheral pulses 2+  Respiratory: CTAB. No rales, rhonchi, or wheezing. On RA.  Abdomen: + bowel sounds, normoactive. No distention or tenderness.  Skin: C/D/I.   Psych: Less anxious than prior exams, appropriate  MSK:     Left ankle in AFO      Neurologic exam:  Cognition: AAO to person, place, time and event.  No apparent cognitive deficits. Insight: Good insight into current condition.  Mood: Anxious mood, elevated but  appropriate Sensation: Equal and intact in BL UE and Les.  Reflexes: 2+ in R  UE and LE;  myoclonus in left lower extremity with passive dorsiflexion.. CN: 2-12 grossly intact.   Spasticity:  Left upper extremity: MAS 1 shoulder abductors, MAS 1+ elbow flexors, MAS 1 elbow extensors; MAS 1 wrist flexors; MAS 0 finger flexors , MAS 0 thumb adductors  Left lower extremity: MAS 1 hip flexors, MAS 0 knee extensors, MAS 2 knee flexors, MAS 3-4 plantar flexors.        Strength:                LUE: 1/5 SA, 2/5 EF, 3/5 EE, 1/5 WE, 3/5 FF, 2/5 FA                 RUE:  5/5 SA, 5/5 EF, 5/5 EE, 5/5 WE, 5/5 FF, 5/5 FA                LLE: 4/5 HF, 2/5 KE, 0/5  DF, 0/5  EHL, 1/5  PF                 RLE:  5/5 HF, 5/5 KE, 5/5  DF, 5/5  EHL, 5/5  PF   No  significant changes 3-12   Assessment/Plan: 1. Functional deficits which require 3+ hours per day of interdisciplinary therapy in a comprehensive inpatient rehab setting. Physiatrist is providing close team supervision and 24 hour management of active medical problems listed below. Physiatrist and rehab team continue to assess barriers to discharge/monitor patient progress toward functional and medical goals  Care Tool:  Bathing    Body parts bathed by patient: Left arm, Chest, Abdomen, Front perineal area, Right upper leg, Left upper leg, Face, Right arm, Right lower leg, Left lower leg, Buttocks   Body parts bathed by helper: Buttocks, Right lower leg, Left lower leg     Bathing assist Assist Level: Minimal Assistance - Patient > 75%     Upper Body Dressing/Undressing Upper body dressing   What is the patient wearing?: Pull over shirt    Upper body assist Assist Level: Minimal Assistance - Patient > 75%    Lower Body Dressing/Undressing Lower body dressing      What is the patient wearing?: Pants     Lower body assist Assist for lower body dressing: Moderate Assistance - Patient 50 - 74%     Toileting Toileting    Toileting  assist Assist for toileting: Moderate Assistance - Patient 50 - 74%     Transfers Chair/bed transfer  Transfers assist     Chair/bed transfer assist level: Moderate Assistance - Patient 50 - 74%     Locomotion Ambulation   Ambulation assist      Assist level: 2 helpers Assistive device:  (Rt hand rail) Max distance: 5'   Walk 10 feet activity   Assist  Walk 10 feet activity did not occur: Safety/medical concerns (Fatigue)        Walk 50 feet activity   Assist Walk 50 feet with 2 turns activity did not occur: Safety/medical concerns         Walk 150 feet activity   Assist Walk 150 feet activity did not occur: Safety/medical concerns         Walk 10 feet on uneven surface  activity   Assist Walk 10 feet on uneven surfaces activity did not occur: Safety/medical concerns         Wheelchair     Assist Is the patient using a wheelchair?: Yes Type of Wheelchair: Manual    Wheelchair assist level: Dependent - Patient 0% Max wheelchair distance: 150'    Wheelchair 50 feet with 2 turns activity    Assist        Assist Level: Dependent - Patient 0%   Wheelchair 150 feet activity     Assist      Assist Level: Dependent - Patient 0%   Blood pressure 118/87, pulse 66, temperature 98.3 F (36.8 C), resp. rate 16, height 6\' 2"  (1.88 m), weight 78.7 kg, SpO2 99%.   Medical Problem List and Plan: 1. 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 Progressing well with therapies.    3-10: Looking into getting better PRAFO for positioning and to prevent contractures in left lower extremity.  Considering elbow bed sloe for  positioning as well, although patient has full range of motion  here and is agreeable to trying to stretch in bed.  3/11: Min A UB ADLs, Mod A LBD, is getting bicep movement and deltoid 1/5 now. With PT Min A transfers, Mod A ambulating 30 ft., progressing L leg independently. Limited by tone.   3.12: Custom AFO consult placed with [facility]  2.  Antithrombotics: -DVT/anticoagulation:  Pharmaceutical: Lovenox d/c'd 3/1             -antiplatelet therapy: N/A   3. Pain Management:  - 2/27: Tylenol 350 to 650 mg every 6 hours for mild to moderate pain, tramadol 50 mg every 6 hours as needed for severe pain.  Has minimal complaints of pain so far, would consider gabapentin if additional needed.  3/3: Using tramadol approximately BID for lower back pain; with benefit.  4. Mood/Behavior/Sleep: Provide emotional support             -antipsychotic agents: N/A -2-27: Was using Xanax at home for sleep.  Has Atarax on board, add as needed melatonin and scheduled trazodone 100 mg nightly.  Will get sleep log.  - 2-28: Sleep improved with trazodone and as needed melatonin; continue.  -3/3: addition of BuSpar 5 mg 3 times daily for anxiety--improving 3/7: Increase BuSpar to 7.5 mg 3 times daily 3-10: Atarax DC'd due to making patient "jumpy" 3-11: Too early to [employer], patient continues to complain of significant anxiety and no sleep.  After considerable back-and-forth discussion, agreed to zolpidem 5 mg nightly as needed while inpatient.  Patient understands this will not be prescribed at discharge. 3-12: Sleep and anxiety improved despite not using zolpidem.  Will still have available as needed   5. Neuropsych/cognition: This patient is capable of making decisions on his own behalf. - reporting severe ongoing anxiety with staff, recommending neuropsychiatry and behavioral assessment as outpatient   - 3-11: Remains in significant anxiety; of note, denies history of drug use outside of marijuana.  6. Skin/Wound Care: Routine skin checks   -DC peripheral  IV  7. Fluids/Electrolytes/Nutrition: Routine in and outs with follow-up chemistries   -Potassium slightly low 3.4, otherwise labs stable.  -3-3: AM labs stable  -09/03/23 pt requested vitamin D level for Mondays labs, ordered  3-10: Vitamin D low, start supplementation.  50,000 units once weekly for 4 doses.  8.  Tobacco use.  Continue [name]- consider discussion of decrease in dose given elevated blood pressure.  Provide counseling   9.  History of polysubstance use.  Urine drug screen positive marijuana.  Patient does endorse using Ativan and Percocet off the streets.  Provide counseling  -3-11: Patient states he in fact was not taking Ativan or Percocet off the streets, that he had been prescribed these earlier in life and was endorsing that when asked.  Wishes the record to reflect this answer.  He does not deny recreational marijuana use.   10.  Constipation/diarrhea.  Continue Senokot S1 tablet twice daily.   -Multiple liquid stools overnight, large; DC Senokot  Improved -3-6: Increase to Senokot S1 tab twice daily, MiraLAX daily.  Add Anusol as needed.  -09/06/23 LBM yesterday, monitor  11.  LLE >> LUE Spasticity/myoclonus.  -Ordered left WHO today, may need custom per OT -Increase baclofen from 10 mg twice daily to 10 mg 3 times daily; continue Zanaflex 2 mg every 8 hours as needed for spasms -Monitor with therapy; would consider addition of antiepileptic if significant myoclonus -Discussed with patient this  a.m., may need Botox exam and injection while in inpatient rehab for left finger flexor tightness, will monitor 1 week on medication prior to pursuing -2-28: Tone improved with increased baclofen; per therapy reports, ongoing issues with LLE myoclonus, could benefit from Depakote for this and ongoing mood/behavior difficulties, but will hold off to see if any continued improvement over the weekend on current medications. 3/2: asked Trey Paula PT if longer foot rest can be applied  to wheelchair as patient experiencing clonus with current and feels it is too small  3/3: Using tizanidine and baclofen with benefit 3-5: Myoclonus remains very limiting.  Will avoid Valium due to substance use history; start Depakote 125 mg twice daily.  Will monitor LFTs in next few days. 3-6: Myoclonus improved!  Continue Depakote at current dose, check LFTs on 3/7.  Tone remains severe, will increase baclofen to 15 mg 3 times daily tizanidine has been sedating. 3/7: LFTs normal, myoclonus with effort today but much easier to break and no longer occurring at rest.  Left upper extremity getting some finger and wrist extension back!  Will increase tizanidine to 4 mg 3 times daily and look at adjusting Depakote next week.  3/10 : will look into getting Xeomen samples for injection tomorrow.  Patient agreeable.  3-11: Increase Depakote to 2025 mg every 8 hours.  Perform Xeomin injection as below after discussion of risks and benefits with patient, patient consent and timeout.  The below mentioned sites were cleaned and prepped with alcohol swabs, and muscles were identified by anatomical landmarks and confirmed with use of EMG and stim.  Patient tolerated procedure well without bleeding or side effect.   LLE:   50 units medial gastrocnemius   50 units lateral gastrocnemius   50 units soleus   50 units lateral hamstrings  3-12: Myoclonus and left ankle tone improved today with Depakote increase; doubtful Xeomin injections are kicking in yet.  Will get LFTs at the end of the week.  12. Bradycardia/Incomplete bundle branch block - episodes of high 40s/low 50s intermittent throughout hospitalization  - EKG today NSR 60 bpm, with QTC 440   - No obvious medication contributors - very rare s/e to tramadol  - If  symptomatic, could consider cardiology consult and holter monitor/ziopatch -HR reviewed and appears to have resolved, discussed that lower heart rate at night is physiological  - 3/2: cardiology  consulted, discussed with patient that no changes recommended  - 3/4: No bradycardia or sleep apnea on overnight pulse ox  14. Nasal congestion: asked nursing to advise patient not to use home nasal spray as it is a vasoconstrictor and can increase stroke risk  -Using saline nose spray only, can continue  15. Dizziness/vertigo   - Scopalamine patch   - Meclizine 25 mg TID PRN   - 3/4: OT doing vestibular eval today--no peripheral vertigo  3/11: improving  16. Left ankle roll.  No apparent deformity, negative [place] ankle rules for imaging. -Discussed with PT using Aircast for positioning and support given history of frequent ankle rolling -Not bothersome to the patient at this time - 3/11: pain ongoing; xray ordered--patient refused   LOS: 14 days A FACE TO FACE EVALUATION WAS PERFORMED  Angelina Sheriff 09/07/2023, 8:49 AM

## 2023-09-07 NOTE — Plan of Care (Signed)
  Problem: RH BOWEL ELIMINATION Goal: RH STG MANAGE BOWEL WITH ASSISTANCE Description: STG Manage Bowel with mod I  Assistance. Outcome: Progressing   Problem: RH SAFETY Goal: RH STG ADHERE TO SAFETY PRECAUTIONS W/ASSISTANCE/DEVICE Description: STG Adhere to Safety Precautions With cues Assistance/Device. Outcome: Progressing   Problem: RH PAIN MANAGEMENT Goal: RH STG PAIN MANAGED AT OR BELOW PT'S PAIN GOAL Description: < 4 with prns Outcome: Progressing

## 2023-09-08 NOTE — Plan of Care (Signed)
  Problem: RH BOWEL ELIMINATION Goal: RH STG MANAGE BOWEL WITH ASSISTANCE Description: STG Manage Bowel with mod I  Assistance. Outcome: Progressing   Problem: RH SAFETY Goal: RH STG ADHERE TO SAFETY PRECAUTIONS W/ASSISTANCE/DEVICE Description: STG Adhere to Safety Precautions With cues Assistance/Device. Outcome: Progressing   Problem: RH PAIN MANAGEMENT Goal: RH STG PAIN MANAGED AT OR BELOW PT'S PAIN GOAL Description: < 4 with prns Outcome: Progressing

## 2023-09-08 NOTE — Progress Notes (Signed)
 Physical Therapy Session Note  Patient Details  Name: Erik Santana. MRN: [id number] Date of Birth: 1990/07/12  Today's Date: 09/08/2023 PT Individual Time: 0835-0950 PT Individual Time Calculation (min): 75 min   Short Term Goals: Week 2:  PT Short Term Goal 1 (Week 2): Pt will complete sit to stand consistently with CGA. PT Short Term Goal 2 (Week 2): Pt will complete bed to chair consistently with minA. PT Short Term Goal 3 (Week 2): Pt will ambulate x25' with minA and LRAD.  Skilled Therapeutic Interventions/Progress Updates:   Pt presents in bed and eager for therapy session. Supervision for bed mobility to come to EOB. Discussed that pt was wearing PRAFO vs the other splint issued and he attributed it to nursing having a hard time donning it last night. EOB pt donned R sock with set up assist and assist for L sock needed for time management as well as donning of shoes and L AFO. Clonus minimal at times initially with foot in AFO resting on wc but dissipated throughout session. Pt performed min assist stand pivot from bed > w/c with focus on technique, controlled movement, and postural control as well as functional use of LLE. Self propelled w/c down to therapy gym with supervision x 150' but demonstrates decreased coordination and noted that his posture in the w/c with the dump makes him compensate to get his R foot to reach the floor - did not further focus on this in this session, but if anticipating him functionally using w/c at home, will need to focus on this for better mechanics for propulsion. Removed AFO to trial standing and sit <> stands without AFO to see how clonus and ankle reacts since pt had received botox this week. NMR to focus on sit <> stands, transitional movement, equal weightbearing through LLE and trunk posture in standing - blocked practice in parallel bars with variation of LUE hand placement on w/c, bars and no UE support - overall min assist but quickly compensates  to just using his R side - repeated ~ 10-15 times for blocked practice training with focus then in standing with WB to the L and not going into hyperextension and does demonstrate carryover.   Progressed to gait trial in parallel bars with forwards and backwards walking without AFO, no ankle instability noted and no clonus noted but significant knee hyperextension on the L- pt does increase R trunk lean and rotation as LUE tone kicks in - added in swedish knee cage to address hyperextentions and then repeated this several reps x 10' each trial about 5 times total. Transitioned to hallway with rail and mirror for visual feedback x 30' and 6 trials with forward and then backwards the same distance - with seated breaks as needed overall min to mod assist. Cues needed to keep head up to decreased dizziness symptoms, cues for L foot placement and attempts for activation of knee and hip flexion as pt circumducts leg - hyperextension in L improved in stance with use of knee cage in session but noted decreased foot clearance as well. As LUE tone increases during gait, trunk flexes to the R - trialed used of giv mohr sling which initially during gait looked great, but as fatigued and tone increased, it overpowered the sling. Defer at this time.  Discussed and recommended he continue with his VOR exercises that were issued for in his room - related that he does not do them as often as he should and recommend doing towards  end of day if it causes an increase in symptoms. Pt verbalized understanding.    Hand off to OT for next session.  Pt making excellent gains and continues to be very motivated to work on all tasks. Plan for AFO consult this PM with primary PT.   Therapy Documentation Precautions:  Precautions Precautions: Fall Precaution/Restrictions Comments: left LE tone Restrictions Weight Bearing Restrictions Per Provider Order: No  Pain: Reports generalized pain in L UE and LLE but states overall it  is ok right now - received medication earlier per report.   Therapy/Group: Individual Therapy  Karolee Stamps Darrol Poke, PT, DPT, CBIS  09/08/2023, 10:11 AM

## 2023-09-08 NOTE — Plan of Care (Signed)
  Problem: Consults Goal: RH STROKE PATIENT EDUCATION Description: See Patient Education module for education specifics  Outcome: Progressing   Problem: RH BOWEL ELIMINATION Goal: RH STG MANAGE BOWEL WITH ASSISTANCE Description: STG Manage Bowel with mod I Assistance. Outcome: Progressing Goal: RH STG MANAGE BOWEL W/MEDICATION W/ASSISTANCE Description: STG Manage Bowel with Medication with  mod I Assistance. Outcome: Progressing   Problem: RH SAFETY Goal: RH STG ADHERE TO SAFETY PRECAUTIONS W/ASSISTANCE/DEVICE Description: STG Adhere to Safety Precautions With cues Assistance/Device. Outcome: Progressing   Problem: RH PAIN MANAGEMENT Goal: RH STG PAIN MANAGED AT OR BELOW PT'S PAIN GOAL Description: < 4 with prns Outcome: Progressing   Problem: RH KNOWLEDGE DEFICIT Goal: RH STG INCREASE KNOWLEDGE OF HYPERTENSION Description: Patient and family will be able to manage HTN with medications and educational resources for dietary modification, lifestyle modification independently. Outcome: Progressing Goal: RH STG INCREASE KNOWLEGDE OF HYPERLIPIDEMIA Description: Patient and family will be able to manage HLD with medications and educational resources for dietary modification, lifestyle modification independently. Outcome: Progressing Goal: RH STG INCREASE KNOWLEDGE OF STROKE PROPHYLAXIS Description: Patient and family will be able to manage secondary risks with medications and educational resources for dietary modification, lifestyle modification independently. Outcome: Progressing

## 2023-09-08 NOTE — Progress Notes (Signed)
 Physical Therapy Session Note  Patient Details  Name: Erik Santana. MRN: [id number] Date of Birth: [birth year]  Today's Date: 09/08/2023 PT Individual Time: 1300-1415 PT Individual Time Calculation (min): 75 min   Today's Date: 09/08/2023 PT Individual Time: 4098-1191 PT Individual Time Calculation (min): 28 min   Short Term Goals: Week 2:  PT Short Term Goal 1 (Week 2): Pt will complete sit to stand consistently with CGA. PT Short Term Goal 2 (Week 2): Pt will complete bed to chair consistently with minA. PT Short Term Goal 3 (Week 2): Pt will ambulate x25' with minA and LRAD.  Skilled Therapeutic Interventions/Progress Updates:     1st Session: Pt received supine in bed and agrees to therapy. Reports some pain in Lt ankle. PT provides rest breaks as needed to manage pain. Supine to sit with cues for sequencing and positioning at EOB. Pt performs squat pivot to WC with CGA and cues for foot placement, body mechanics, and sequencing. WC transport to gym for time management. [name] with [employer] present to evaluate pt for potential AFO. Pt stands with minA and cues for weight distribution. Pt ambulates x30' with modA under LUE, with cues for lateral weight shifting, tactile cueing at Lt popliteal fossa to prevent hyperextention, and ensuring that pt does not rush off LLE during stance phase to promote symmetrical reciprocal gait pattern and consistent WB through LLE during. Following rest break pt ambulates same distance with inclusion of Lt AFO with much improved foot clearance, less tendency for ankle inversion, and increased control of knee extension. Pt transitions to supine and perform bridges in hooklying for NMR. Pt completes 1x10 with 10 count hold on final rep with cues for foot placement and body mechanics. Pt then completes 3x10 with ball placed between knees to promote adductor activation and increased NM control. Pt completes 3x10 with red theraband around distal thighs to promote  increased abductor activation.  Pt performs hooklying hip rotations into internal and external rotations for NMR and lower back relaxation. Pt transitions to seated with modA HHA and cues for sequencing. Pt then performs standing NMR for LLE, tasked with tapping solo cup with RLE to encourage pt to perform movement in slow and controlled fashion to prevent crushing cup and promote increased motor control of LLE. Pt completes 3x10 with modA at Lt trunk to promote weight shifting and prevent hyperextension of Lt knee. WC transport back to room. Left seated with all needs within reach.   2nd Session: Pt received seated in [facility] and agrees to therapy. No complaint of pain. Session emphasizes NMR and disassociation of trunk and hips to break synergistic patterns associated with pt's high tone. Pt performs stand step from [facility] onto large theraball with pt's father stabilizing ball and PT providing modA. Once seated on ball, pt tasked with performing lateral and anterior/posterior weight shifts and pelvic tilts and rotations. PT provides minA for majority of activity but several instances of modA for stability. Pt also performs 2x5 LAQs with RLE and LLE to challenge core muscles and coordination. Pt performs stand pivot back to bed with minA and cues for sequencing. Left supine in bed with all needs within reach.   Therapy Documentation Precautions:  Precautions Precautions: Fall Precaution/Restrictions Comments: left LE tone Restrictions Weight Bearing Restrictions Per Provider Order: No   Therapy/Group: Individual Therapy  [name], PT, DPT 09/08/2023, 4:47 PM

## 2023-09-08 NOTE — Progress Notes (Signed)
 Occupational Therapy Session Note  Patient Details  Name: Erik Santana. MRN: [id number] Date of Birth: [birth year]  Today's Date: 09/08/2023 OT Individual Time: 3474-2595 OT Individual Time Calculation (min): 60 min    Short Term Goals: Week 1:  OT Short Term Goal 1 (Week 1): Pt will use left UE as gross stabilization in grooming task OT Short Term Goal 1 - Progress (Week 1): Not met OT Short Term Goal 2 (Week 1): Pt will be given instructions on self ROM to do in bed with left UE OT Short Term Goal 2 - Progress (Week 1): Met OT Short Term Goal 3 (Week 1): Pt will transfer with mod A to toilet OT Short Term Goal 3 - Progress (Week 1): Met Week 2:  OT Short Term Goal 1 (Week 2): Pt will transfer to toilet toward the R with min A OT Short Term Goal 2 (Week 2): Pt will don pants with min A OT Short Term Goal 3 (Week 2): Pt will complete UB bathing with CGA OT Short Term Goal 4 (Week 2): Pt will position LUE appropriately at rest with no cueing Week 3:     Skilled Therapeutic Interventions/Progress Updates:    1:1 Pt received from previous therapy. Focus on self care retraining at shower level. Pt ambulated into the bathroom with min to mod A with quad cane with knee cage on the right knee for knee control. Pt performed toileting with min A for balance sit to stand and cues for proper positioning of right LE in prep for standing. Pt then transitioned into the shower (facing outward) to better assist with knee control with standing in the shower. Pt was able to wash 9/10 parts with min A and focus on [facility] assistance for use of left UE to wash right UE/underarm. Transferred out of the shower to the w/c and performed dressing sit to stand. Again focus on functional use and activation of left UE to assist with donning deodorant and reaching to thread left UE. Pt performed figure 4 position to thread pants and sock on left LE. Sit to stand with facilitation to maintain knee at neutral and contact  guard for balance for clothing management.  Setup Assistance for opening containers for breakfast meal.   Therapy Documentation Precautions:  Precautions Precautions: Fall Precaution/Restrictions Comments: left LE tone Restrictions Weight Bearing Restrictions Per Provider Order: No  Pain: No c/o pain in session   Therapy/Group: Individual Therapy  Roney Mans [facility] 09/08/2023, 3:47 PM

## 2023-09-08 NOTE — Progress Notes (Signed)
 PROGRESS NOTE   Subjective/Complaints:  No acute complaints.  No events overnight.  Slept well.  AFO consult pending today, is using a knee cage with therapies and feels that is going well.  Overall, close doing better, but still difficult when he goes into active dorsiflexion..  ROS: Denies fevers, chills, N/V, abdominal pain, diarrhea, SOB, cough, chest pain, new weakness or paraesthesias.    + insomnia/anxiety-slightly improved + LUE and LLE myoclonus-intermittent, better today + Spasticity -ongoing/improving + constipation--improved  Objective:   No results found. Recent Labs    09/05/23 1134  WBC 6.5  HGB 15.5  HCT 45.3  PLT 417*     Recent Labs    09/05/23 1134  NA 137  K 4.1  CL 104  CO2 25  GLUCOSE 91  BUN 11  CREATININE 1.01  CALCIUM 9.3      Intake/Output Summary (Last 24 hours) at 09/08/2023 0837 Last data filed at 09/07/2023 1530 Gross per 24 hour  Intake 580 ml  Output 400 ml  Net 180 ml        Physical Exam: Vital Signs Blood pressure 133/80, pulse 78, temperature 98 F (36.7 C), resp. rate 16, height 6\' 2"  (1.88 m), weight 78.7 kg, SpO2 97%.  Constitutional: No apparent distress. Appropriate appearance for age.  Sitting up in therapy gym. HENT: Atraumatic, normocephalic.  Eyes: PERRLA. EOMI. no obvious vertigo on exam.   Cardiovascular: Regular rate, regular rhythm, no murmurs/rub/gallops. No Edema. Peripheral pulses 2+  Respiratory: CTAB. No rales, rhonchi, or wheezing. On RA.  Abdomen: + bowel sounds, normoactive. No distention or tenderness.  Skin: C/D/I.   Psych: Less anxious , appropriate  MSK:     Left ankle passive range of motion -5 grays; tone much improved    Neurologic exam:  Cognition: AAO to person, place, time and event.  No apparent cognitive deficits. Insight: Good insight into current condition.  Mood: Anxious mood, elevated but appropriate Sensation: Equal  and intact in BL UE and Les.  Reflexes: 2+ in R  UE and LE;  myoclonus in left lower extremity with passive dorsiflexion.. CN: 2-12 grossly intact.   Spasticity:  Left upper extremity: MAS 1 shoulder abductors, MAS 1+ elbow flexors, MAS 1 elbow extensors; MAS 1 wrist flexors; MAS 0 finger flexors , MAS 0 thumb adductors--exam today complicated by flexor synergy with mobilization.  Left lower extremity: MAS 1 hip flexors, MAS 0 knee extensors, MAS 1 knee flexors, MAS 1 plantar flexors. --Much improved       Strength:                LUE: 1/5 SA, 2/5 EF, 3/5 EE, 1/5 WE, 3/5 FF, 2/5 FA --exam complicated by flexor synergy pattern 3-13                RUE:  5/5 SA, 5/5 EF, 5/5 EE, 5/5 WE, 5/5 FF, 5/5 FA                LLE: 4/5 HF, 2/5 KE, 0/5  DF, 0/5  EHL, 1/5  PF                 RLE:  5/5  HF, 5/5 KE, 5/5  DF, 5/5  EHL, 5/5  PF     Assessment/Plan: 1. Functional deficits which require 3+ hours per day of interdisciplinary therapy in a comprehensive inpatient rehab setting. Physiatrist is providing close team supervision and 24 hour management of active medical problems listed below. Physiatrist and rehab team continue to assess barriers to discharge/monitor patient progress toward functional and medical goals  Care Tool:  Bathing    Body parts bathed by patient: Left arm, Chest, Abdomen, Front perineal area, Right upper leg, Left upper leg, Face, Right arm, Right lower leg, Left lower leg, Buttocks   Body parts bathed by helper: Buttocks, Right lower leg, Left lower leg     Bathing assist Assist Level: Minimal Assistance - Patient > 75%     Upper Body Dressing/Undressing Upper body dressing   What is the patient wearing?: Pull over shirt    Upper body assist Assist Level: Minimal Assistance - Patient > 75%    Lower Body Dressing/Undressing Lower body dressing      What is the patient wearing?: Pants     Lower body assist Assist for lower body dressing: Moderate Assistance -  Patient 50 - 74%     Toileting Toileting    Toileting assist Assist for toileting: Moderate Assistance - Patient 50 - 74%     Transfers Chair/bed transfer  Transfers assist     Chair/bed transfer assist level: Moderate Assistance - Patient 50 - 74%     Locomotion Ambulation   Ambulation assist      Assist level: 2 helpers Assistive device:  (Rt hand rail) Max distance: 5'   Walk 10 feet activity   Assist  Walk 10 feet activity did not occur: Safety/medical concerns (Fatigue)        Walk 50 feet activity   Assist Walk 50 feet with 2 turns activity did not occur: Safety/medical concerns         Walk 150 feet activity   Assist Walk 150 feet activity did not occur: Safety/medical concerns         Walk 10 feet on uneven surface  activity   Assist Walk 10 feet on uneven surfaces activity did not occur: Safety/medical concerns         Wheelchair     Assist Is the patient using a wheelchair?: Yes Type of Wheelchair: Manual    Wheelchair assist level: Dependent - Patient 0% Max wheelchair distance: 150'    Wheelchair 50 feet with 2 turns activity    Assist        Assist Level: Dependent - Patient 0%   Wheelchair 150 feet activity     Assist      Assist Level: Dependent - Patient 0%   Blood pressure 133/80, pulse 78, temperature 98 F (36.7 C), resp. rate 16, height 6\' 2"  (1.88 m), weight 78.7 kg, SpO2 97%.   Medical Problem List and Plan: 1. 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 Progressing well with therapies.    3-10: Looking into getting better PRAFO for positioning and to prevent  contractures in left lower extremity.  Considering elbow bed sloe for  positioning as well, although patient has full range of motion here and is agreeable to trying to stretch in bed.  3/11: Min A UB ADLs, Mod A LBD, is getting bicep movement and deltoid 1/5 now. With PT Min A transfers, Mod A ambulating 30 ft., progressing L leg independently. Limited by tone.   3.12: Custom AFO consult placed with [employer]  2.  Antithrombotics: -DVT/anticoagulation:  Pharmaceutical: Lovenox d/c'd 3/1             -antiplatelet therapy: N/A   3. Pain Management:  - 2/27: Tylenol 350 to 650 mg every 6 hours for mild to moderate pain, tramadol 50 mg every 6 hours as needed for severe pain.  Has minimal complaints of pain so far, would consider gabapentin if additional needed.  3/3: Using tramadol approximately BID for lower back pain; with benefit.  4. Mood/Behavior/Sleep: Provide emotional support             -antipsychotic agents: N/A -2-27: Was using Xanax at home for sleep.  Has Atarax on board, add as needed melatonin and scheduled trazodone 100 mg nightly.  Will get sleep log.  - 2-28: Sleep improved with trazodone and as needed melatonin; continue.  -3/3: addition of BuSpar 5 mg 3 times daily for anxiety--improving 3/7: Increase BuSpar to 7.5 mg 3 times daily 3-10: Atarax DC'd due to making patient "jumpy" 3-11: Too early to [employer], patient continues to complain of significant anxiety and no sleep.  After considerable back-and-forth discussion, agreed to zolpidem 5 mg nightly as needed while inpatient.  Patient understands this will not be prescribed at discharge. 3-12: Sleep and anxiety improved despite not using zolpidem.  Will still have available as needed--used, with benefit 3-13   5. Neuropsych/cognition: This patient is capable of making decisions on his own behalf. - reporting severe ongoing anxiety with staff, recommending neuropsychiatry and behavioral assessment as outpatient   - 3-11: Remains in significant anxiety; of note, denies history of drug use  outside of marijuana.  6. Skin/Wound Care: Routine skin checks   -DC peripheral IV  7. Fluids/Electrolytes/Nutrition: Routine in and outs with follow-up chemistries   -Potassium slightly low 3.4, otherwise labs stable.  -3-3: AM labs stable  -09/03/23 pt requested vitamin D level for Mondays labs, ordered  3-10: Vitamin D low, start supplementation.  50,000 units once weekly for 4 doses.  8.  Tobacco use.  Continue [name]- consider discussion of decrease in dose given elevated blood pressure.  Provide counseling   9.  History of polysubstance use.  Urine drug screen positive marijuana.  Patient does endorse using Ativan and Percocet off the streets.  Provide counseling  -3-11: Patient states he in fact was not taking Ativan or Percocet off the streets, that he had been prescribed these earlier in life and was endorsing that when asked.  Wishes the record to reflect this answer.  He does not deny recreational marijuana use.   10.  Constipation/diarrhea.  Continue Senokot S1 tablet twice daily.   -Multiple liquid stools overnight, large; DC Senokot  Improved -3-6: Increase to Senokot S1 tab twice daily, MiraLAX daily.  Add Anusol as needed.  -09/06/23 LBM yesterday, monitor  3-13: Patient denies any further diarrhea, straining.  Continue current regimen.  11.  LLE >> LUE Spasticity/myoclonus.  -Ordered left WHO today, may need custom per OT -Increase baclofen from 10 mg twice daily to 10 mg 3 times daily;  continue Zanaflex 2 mg every 8 hours as needed for spasms -Monitor with therapy; would consider addition of antiepileptic if significant myoclonus -Discussed with patient this a.m., may need Botox exam and injection while in inpatient rehab for left finger flexor tightness, will monitor 1 week on medication prior to pursuing -2-28: Tone improved with increased baclofen; per therapy reports, ongoing issues with LLE myoclonus, could benefit from Depakote for this and ongoing  mood/behavior difficulties, but will hold off to see if any continued improvement over the weekend on current medications. 3/2: asked Trey Paula PT if longer foot rest can be applied to wheelchair as patient experiencing clonus with current and feels it is too small  3/3: Using tizanidine and baclofen with benefit 3-5: Myoclonus remains very limiting.  Will avoid Valium due to substance use history; start Depakote 125 mg twice daily.  Will monitor LFTs in next few days. 3-6: Myoclonus improved!  Continue Depakote at current dose, check LFTs on 3/7.  Tone remains severe, will increase baclofen to 15 mg 3 times daily tizanidine has been sedating. 3/7: LFTs normal, myoclonus with effort today but much easier to break and no longer occurring at rest.  Left upper extremity getting some finger and wrist extension back!  Will increase tizanidine to 4 mg 3 times daily and look at adjusting Depakote next week.  3/10 : will look into getting Xeomen samples for injection tomorrow.  Patient agreeable.  3-11: Increase Depakote to 2025 mg every 8 hours.  Perform Xeomin injection as below after discussion of risks and benefits with patient, patient consent and timeout.  The below mentioned sites were cleaned and prepped with alcohol swabs, and muscles were identified by anatomical landmarks and confirmed with use of EMG and stim.  Patient tolerated procedure well without bleeding or side effect.   LLE:   50 units medial gastrocnemius   50 units lateral gastrocnemius   50 units soleus   50 units lateral hamstrings  3-12: Myoclonus and left ankle tone improved today with Depakote increase; doubtful Xeomin injections are kicking in yet.  Will get LFTs at the end of the week.  12. Bradycardia/Incomplete bundle branch block - episodes of high 40s/low 50s intermittent throughout hospitalization  - EKG today NSR 60 bpm, with QTC 440   - No obvious medication contributors - very rare s/e to tramadol  - If  symptomatic, could  consider cardiology consult and holter monitor/ziopatch -HR reviewed and appears to have resolved, discussed that lower heart rate at night is physiological  - 3/2: cardiology consulted, discussed with patient that no changes recommended  - 3/4: No bradycardia or sleep apnea on overnight pulse ox  14. Nasal congestion: asked nursing to advise patient not to use home nasal spray as it is a vasoconstrictor and can increase stroke risk  -Using saline nose spray only, can continue  15. Dizziness/vertigo   - Scopalamine patch   - Meclizine 25 mg TID PRN   - 3/4: OT doing vestibular eval today--no peripheral vertigo  3/11: improving  3-13: Has not used as needed meclizine in some time; will DC scopolamine patch  16. Left ankle roll.  No apparent deformity, negative [place] ankle rules for imaging. -Discussed with PT using Airc[employer] for positioning and support given history of frequent ankle rolling -Not bothersome to the patient at this time - 3/11: pain ongoing; xray ordered--patient refused AFO for ankle pain/support   LOS: 15 days A FACE TO FACE EVALUATION WAS PERFORMED  Angelina Sheriff 09/08/2023, 8:37  AM

## 2023-09-09 LAB — COMPREHENSIVE METABOLIC PANEL
ALT: 32 U/L (ref 0–44)
AST: 22 U/L (ref 15–41)
Albumin: 3.7 g/dL (ref 3.5–5.0)
Alkaline Phosphatase: 49 U/L (ref 38–126)
Anion gap: 8 (ref 5–15)
BUN: 8 mg/dL (ref 6–20)
CO2: 28 mmol/L (ref 22–32)
Calcium: 9.1 mg/dL (ref 8.9–10.3)
Chloride: 100 mmol/L (ref 98–111)
Creatinine, Ser: 0.99 mg/dL (ref 0.61–1.24)
GFR, Estimated: >60 mL/min (ref >60)
Glucose, Bld: 95 mg/dL (ref 70–99)
Potassium: 3.9 mmol/L (ref 3.5–5.1)
Sodium: 136 mmol/L (ref 135–145)
Total Bilirubin: 0.5 mg/dL (ref 0.0–1.2)
Total Protein: 6.6 g/dL (ref 6.5–8.1)

## 2023-09-09 NOTE — Progress Notes (Signed)
 Physical Therapy Session Note  Patient Details  Name: Erik Santana. MRN: [id number] Date of Birth: 17-Mar-1991  Today's Date: 09/09/2023 PT Individual Time: 1133-1206 PT Individual Time Calculation (min): 33 min   Short Term Goals: Week 2:  PT Short Term Goal 1 (Week 2): Pt will complete sit to stand consistently with CGA. PT Short Term Goal 2 (Week 2): Pt will complete bed to chair consistently with minA. PT Short Term Goal 3 (Week 2): Pt will ambulate x25' with minA and LRAD.   Skilled Therapeutic Interventions/Progress Updates:    Session focused on functional transfers, w/c mobility in community environment off the unit and in restaurant setting, and community reintegration education. Pt performed w/c mobility with supervision with hemi technique and cues for positioning in w/c for improved alignment and technique. When the opportunity arises for pt to pull himself on the rail, he will in the hallway. Navigated on and off elevator x 2 with cues for backing into the elevator and needed min assist for this. In restaurant, pt chose to order on tablet vs at counter but completed activity in standing including setting up his w/c, removing legrest with CGA for balance in standing. Performed preparation of coffee at w/c level for safety. Returned back to room with focus on continued practice with w/c mobility in community environment and left up in w/c with all needs in reach and safety belt donned.  Therapy Documentation Precautions:  Precautions Precautions: Fall Precaution/Restrictions Comments: left LE tone Restrictions Weight Bearing Restrictions Per Provider Order: No    Pain: Pain level reporting the same as this morning but premedicated. No further interventions needed.     Therapy/Group: Individual Therapy  Karolee Stamps Darrol Poke, PT, DPT, CBIS  09/09/2023, 12:18 PM

## 2023-09-09 NOTE — Progress Notes (Signed)
 PROGRESS NOTE   Subjective/Complaints:  Vitals with some mild hypertension overnight, otherwise stable. Lab work this a.m. within normal limits, LFTs look fine. Patient endorses no ongoing anxiety, sleeping much better at night.  ROS: Denies fevers, chills, N/V, abdominal pain, diarrhea, SOB, cough, chest pain, new weakness or paraesthesias.    + insomnia/anxiety-improving + LUE and LLE myoclonus-improving + Spasticity -ongoing/improving  Objective:   No results found. No results for input(s): "WBC", "HGB", "HCT", "PLT" in the last 72 hours.    Recent Labs    09/09/23 0651  NA 136  K 3.9  CL 100  CO2 28  GLUCOSE 95  BUN 8  CREATININE 0.99  CALCIUM 9.1      Intake/Output Summary (Last 24 hours) at 09/09/2023 0981 Last data filed at 09/09/2023 0900 Gross per 24 hour  Intake 1040 ml  Output 700 ml  Net 340 ml        Physical Exam: Vital Signs Blood pressure (!) 134/92, pulse 94, temperature 98.2 F (36.8 C), resp. rate 16, height 6\' 2"  (1.88 m), weight 78.7 kg, SpO2 98%.  Constitutional: No apparent distress. Appropriate appearance for age.  Sitting upright at bedside. HENT: Atraumatic, normocephalic.  Eyes: PERRLA. EOMI. no obvious vertigo on exam.   Cardiovascular: Regular rate, regular rhythm, no murmurs/rub/gallops. No Edema.  Respiratory: CTAB. No rales, rhonchi, or wheezing. On RA.  Abdomen: + bowel sounds, normoactive. No distention or tenderness.  Skin: C/D/I.   Psych: Less anxious , appropriate  MSK:     Left upper extremity with e-stim     Left lower extremity in carbon fiber AFO, can range ankle passively past neutral    Neurologic exam:  Cognition: AAO to person, place, time and event.  No apparent cognitive deficits. Insight: Good insight into current condition.  Mood: Anxious mood, elevated but appropriate Sensation: Equal and intact in BL UE and Les.  Reflexes: 2+ in R  UE and LE;   myoclonus in left lower extremity with passive dorsiflexion.. CN: 2-12 grossly intact.   Spasticity:  Left upper extremity: MAS 1 shoulder abductors, MAS 1+ elbow flexors, MAS 1 elbow extensors; MAS 1 wrist flexors; MAS 0 finger flexors , MAS 0 thumb adductors--with a e-stim on today, unable to extend/abduct fingers  Left lower extremity: MAS 0 hip flexors, MAS 0 knee extensors, MAS 1 knee flexors, MAS 1-0 plantar flexors. --Much improved       Strength:                LUE: 1/5 SA, 2/5 EF, 3/5 EE, 1/5 WE, 3/5 FF, 2/5 FA prior exams                 RUE:  5/5 SA, 5/5 EF, 5/5 EE, 5/5 WE, 5/5 FF, 5/5 FA                LLE: 4/5 HF, 2/5 KE, 0/5  DF, 0/5  EHL, 1/5  PF --unchanged                RLE:  5/5 HF, 5/5 KE, 5/5  DF, 5/5  EHL, 5/5  PF     Assessment/Plan: 1. Functional deficits which  require 3+ hours per day of interdisciplinary therapy in a comprehensive inpatient rehab setting. Physiatrist is providing close team supervision and 24 hour management of active medical problems listed below. Physiatrist and rehab team continue to assess barriers to discharge/monitor patient progress toward functional and medical goals  Care Tool:  Bathing    Body parts bathed by patient: Left arm, Chest, Abdomen, Front perineal area, Right upper leg, Left upper leg, Face, Right arm, Right lower leg, Left lower leg, Buttocks   Body parts bathed by helper: Buttocks, Right lower leg, Left lower leg     Bathing assist Assist Level: Minimal Assistance - Patient > 75%     Upper Body Dressing/Undressing Upper body dressing   What is the patient wearing?: Pull over shirt    Upper body assist Assist Level: Minimal Assistance - Patient > 75%    Lower Body Dressing/Undressing Lower body dressing      What is the patient wearing?: Pants     Lower body assist Assist for lower body dressing: Moderate Assistance - Patient 50 - 74%     Toileting Toileting    Toileting assist Assist for toileting:  Moderate Assistance - Patient 50 - 74%     Transfers Chair/bed transfer  Transfers assist     Chair/bed transfer assist level: Minimal Assistance - Patient > 75%     Locomotion Ambulation   Ambulation assist      Assist level: Moderate Assistance - Patient 50 - 74% Assistive device: Other (comment) (rail and parallel bars) Max distance: 30'   Walk 10 feet activity   Assist  Walk 10 feet activity did not occur: Safety/medical concerns (Fatigue)  Assist level: Minimal Assistance - Patient > 75% Assistive device: Parallel bars, Other (comment) (rail)   Walk 50 feet activity   Assist Walk 50 feet with 2 turns activity did not occur: Safety/medical concerns         Walk 150 feet activity   Assist Walk 150 feet activity did not occur: Safety/medical concerns         Walk 10 feet on uneven surface  activity   Assist Walk 10 feet on uneven surfaces activity did not occur: Safety/medical concerns         Wheelchair     Assist Is the patient using a wheelchair?: Yes Type of Wheelchair: Manual    Wheelchair assist level: Supervision/Verbal cueing Max wheelchair distance: 150'    Wheelchair 50 feet with 2 turns activity    Assist        Assist Level: Supervision/Verbal cueing   Wheelchair 150 feet activity     Assist      Assist Level: Supervision/Verbal cueing   Blood pressure (!) 134/92, pulse 94, temperature 98.2 F (36.8 C), resp. rate 16, height 6\' 2"  (1.88 m), weight 78.7 kg, SpO2 98%.   Medical Problem List and Plan: 1. 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 Progressing well with therapies.    3-10: Looking into getting better PRAFO for positioning and to  prevent contractures in left lower extremity.  Considering elbow  bed sloe for positioning as well, although patient has full range of motion here and is agreeable to trying to stretch in bed.  3/11: Min A UB ADLs, Mod A LBD, is getting bicep movement and deltoid 1/5 now. With PT Min A transfers, Mod A ambulating 30 ft., progressing L leg independently. Limited by tone.   3.12: Custom AFO consult placed with [employer]  2.  Antithrombotics: -DVT/anticoagulation:  Pharmaceutical: Lovenox d/c'd 3/1             -antiplatelet therapy: N/A   3. Pain Management:  - 2/27: Tylenol 350 to 650 mg every 6 hours for mild to moderate pain, tramadol 50 mg every 6 hours as needed for severe pain.  Has minimal complaints of pain so far, would consider gabapentin if additional needed.  3/3: Using tramadol approximately BID for lower back pain; with benefit.  4. Mood/Behavior/Sleep: Provide emotional support             -antipsychotic agents: N/A -2-27: Was using Xanax at home for sleep.  Has Atarax on board, add as needed melatonin and scheduled trazodone 100 mg nightly.  Will get sleep log.  - 2-28: Sleep improved with trazodone and as needed melatonin; continue.  -3/3: addition of BuSpar 5 mg 3 times daily for anxiety--improving 3/7: Increase BuSpar to 7.5 mg 3 times daily 3-10: Atarax DC'd due to making patient "jumpy" 3-11: Too early to [employer], patient continues to complain of significant anxiety and no sleep.  After considerable back-and-forth discussion, agreed to zolpidem 5 mg nightly as needed while inpatient.  Patient understands this will not be prescribed at discharge. 3-12: Sleep and anxiety improved despite not using zolpidem.  Will still have available as needed--used, with benefit 3-13: Anxiety significantly improved, suspect in part due to Depakote; continue current regimen   5. Neuropsych/cognition: This patient is capable of making decisions on his own behalf. - reporting severe ongoing  anxiety with staff, recommending neuropsychiatry and behavioral assessment as outpatient   - 3-11: Remains in significant anxiety; of note, denies history of drug use outside of marijuana.  6. Skin/Wound Care: Routine skin checks   -DC peripheral IV  7. Fluids/Electrolytes/Nutrition: Routine in and outs with follow-up chemistries   -Potassium slightly low 3.4, otherwise labs stable.  -3-3: AM labs stable  -09/03/23 pt requested vitamin D level for Mondays labs, ordered  3-10: Vitamin D low, start supplementation.  50,000 units once weekly for 4 doses.  8.  Tobacco use.  Continue [name]- consider discussion of decrease in dose given elevated blood pressure.  Provide counseling   9.  History of polysubstance use.  Urine drug screen positive marijuana.  Patient does endorse using Ativan and Percocet off the streets.  Provide counseling  -3-11: Patient states he in fact was not taking Ativan or Percocet off the streets, that he had been prescribed these earlier in life and was endorsing that when asked.  Wishes the record to reflect this answer.  He does not deny recreational marijuana use.   10.  Constipation/diarrhea.  Continue Senokot S1 tablet twice daily.   -Multiple liquid stools overnight, large; DC Senokot  Improved -3-6: Increase to Senokot S1 tab twice daily, MiraLAX daily.  Add Anusol as needed.  -09/06/23 LBM yesterday, monitor  3-13: Patient denies any further diarrhea, straining.  Continue current regimen.  11.  LLE >> LUE Spasticity/myoclonus.  -Ordered left WHO today, may need custom per OT -Increase baclofen from 10 mg twice daily to 10 mg  3 times daily; continue Zanaflex 2 mg every 8 hours as needed for spasms -Monitor with therapy; would consider addition of antiepileptic if significant myoclonus -Discussed with patient this a.m., may need Botox exam and injection while in inpatient rehab for left finger flexor tightness, will monitor 1 week on medication prior to  pursuing -2-28: Tone improved with increased baclofen; per therapy reports, ongoing issues with LLE myoclonus, could benefit from Depakote for this and ongoing mood/behavior difficulties, but will hold off to see if any continued improvement over the weekend on current medications. 3/2: asked Trey Paula PT if longer foot rest can be applied to wheelchair as patient experiencing clonus with current and feels it is too small  3/3: Using tizanidine and baclofen with benefit 3-5: Myoclonus remains very limiting.  Will avoid Valium due to substance use history; start Depakote 125 mg twice daily.  Will monitor LFTs in next few days. 3-6: Myoclonus improved!  Continue Depakote at current dose, check LFTs on 3/7.  Tone remains severe, will increase baclofen to 15 mg 3 times daily tizanidine has been sedating. 3/7: LFTs normal, myoclonus with effort today but much easier to break and no longer occurring at rest.  Left upper extremity getting some finger and wrist extension back!  Will increase tizanidine to 4 mg 3 times daily and look at adjusting Depakote next week.  3/10 : will look into getting Xeomen samples for injection tomorrow.  Patient agreeable.  3-11: Increase Depakote to 2025 mg every 8 hours.  Perform Xeomin injection as below after discussion of risks and benefits with patient, patient consent and timeout.  The below mentioned sites were cleaned and prepped with alcohol swabs, and muscles were identified by anatomical landmarks and confirmed with use of EMG and stim.  Patient tolerated procedure well without bleeding or side effect.   LLE:   50 units medial gastrocnemius   50 units lateral gastrocnemius   50 units soleus   50 units lateral hamstrings  3-12: Myoclonus and left ankle tone improved today with Depakote increase; doubtful Xeomin injections are kicking in yet.   3/13: LFTS normal; continue current dose  12. Bradycardia/Incomplete bundle branch block - episodes of high 40s/low 50s  intermittent throughout hospitalization  - EKG today NSR 60 bpm, with QTC 440   - No obvious medication contributors - very rare s/e to tramadol  - If  symptomatic, could consider cardiology consult and holter monitor/ziopatch -HR reviewed and appears to have resolved, discussed that lower heart rate at night is physiological  - 3/2: cardiology consulted, discussed with patient that no changes recommended  - 3/4: No bradycardia or sleep apnea on overnight pulse ox  14. Nasal congestion: asked nursing to advise patient not to use home nasal spray as it is a vasoconstrictor and can increase stroke risk  -Using saline nose spray only, can continue  15. Dizziness/vertigo--improved   - Scopalamine patch   - Meclizine 25 mg TID PRN   - 3/4: OT doing vestibular eval today--no peripheral vertigo  3/11: improving  3-13: Has not used as needed meclizine in some time; will DC scopolamine patch  16. Left ankle roll.  No apparent deformity, negative [place] ankle rules for imaging. -Discussed with PT using Aircast for positioning and support given history of frequent ankle rolling -Not bothersome to the patient at this time - 3/11: pain ongoing; xray ordered--patient refused 3/14: Pending custome AFO for ankle pain/support   LOS: 16 days A FACE TO FACE EVALUATION WAS PERFORMED  Lequita Halt  Christia Reading 09/09/2023, 9:28 AM

## 2023-09-09 NOTE — Progress Notes (Signed)
 Physical Therapy Session Note  Patient Details  Name: Erik Santana. MRN: [id number] Date of Birth: [birth year]  Today's Date: 09/09/2023 PT Individual Time: 9147-8295 PT Individual Time Calculation (min): 75 min   Short Term Goals: Week 1:  PT Short Term Goal 1 (Week 1): Pt will perform bed mobility with modA. PT Short Term Goal 1 - Progress (Week 1): Met PT Short Term Goal 2 (Week 1): Pt will perform sit to stand with modA consistently. PT Short Term Goal 2 - Progress (Week 1): Met PT Short Term Goal 3 (Week 1): Pt will perform bed to chair with modA consistently. PT Short Term Goal 3 - Progress (Week 1): Met PT Short Term Goal 4 (Week 1): Pt will ambulate x25' with modA and LRAD. PT Short Term Goal 4 - Progress (Week 1): Met Week 2:  PT Short Term Goal 1 (Week 2): Pt will complete sit to stand consistently with CGA. PT Short Term Goal 2 (Week 2): Pt will complete bed to chair consistently with minA. PT Short Term Goal 3 (Week 2): Pt will ambulate x25' with minA and LRAD.  Skilled Therapeutic Interventions/Progress Updates:    Pt presents in bed, agreeable to session. States he did not sleep well last night. Also reports increase in clonus in LLE again. Bed mobility with supervision to come to EOB. Donned bilateral socks seated EOB with supervision and PT assisted with shoes and L AFO. Session focused on NMR to address postural control retraining, coordination, balance, and functional mobility.  CGA for modified squat/stand pivot to the w/c to the L with cues for clearance and positioning. PT changed out w/c to non-dumped version to trial to increase ability to self propel with better upright positioning. Performed oral hygiene at sink mod I w/c level. W/c mobility training in hallway and through obstacles to focus on technique, efficiency, and proper alignment to decrease compensatory strategies. Recommending no dump - much improved technique. Transferred to Nustep with CGA and focus on  pt setting up w/c parts management and w/c positioning with cues needed for improved technique. Utilized Nustep on level 6 for NMR for reciprocal movement pattern retraining with BUE and BLE x 10 min with adaptive pieces for positioning for LUE and LLE. Occasional assist with repositioning of LLE. CGA transfer off the NUstep. Pt request to weigh himself on the scale - utilized standing scale with pt with min assist for sit > stand and stepping onto and off of the scale. Gait training with [facility] x 20' x 2 trials with min assist overall but decreased stability noted in ankle today (does not have his new AFO yet), so deferred further. Returned back to room focusing on w/c mobility, setting himself up safely for transfers and transfer back to bed with close supervision. All needs in reach.   Therapy Documentation Precautions:  Precautions Precautions: Fall Precaution/Restrictions Comments: left LE tone Restrictions Weight Bearing Restrictions Per Provider Order: No  Pain: Rates pain in LUE/LLE 7/10 - RN notified and administered medication during session.    Therapy/Group: Individual Therapy  Karolee Stamps Darrol Poke, PT, DPT, CBIS  09/09/2023, 11:35 AM

## 2023-09-09 NOTE — Progress Notes (Signed)
 Occupational Therapy Weekly Progress Note  Patient Details  Name: Erik Santana. MRN: [id number] Date of Birth: Nov 18, 1990  Beginning of progress report period: September 02, 2023 End of progress report period: September 09, 2023  Today's Date: 09/09/2023 OT Individual Time: 1020-1120 OT Individual Time Calculation (min): 60 min    Patient has met 4 of 4 short term goals.  Erik Santana continues to make excellent progress in CIR. He is able to complete bathing seated on a TTB with CGA using lateral leans for LB. He can don a shirt with (S) and requires only min A for pants. He still requires assist to don shoes, especially L d/t AFO. Pt still has difficult to control extensor tone in the LLE and flexor tone in the LUE. He is very motivated to continue progressing and works very hard in therapy. His L hand now has volitional composite flexion, as well as improved extension. There is trace activation in his deltoids and 30 degrees gravity eliminated elbow flexion. Family education will be completed closer to d/c.   Patient continues to demonstrate the following deficits: muscle weakness and muscle joint tightness, impaired timing and sequencing, abnormal tone, unbalanced muscle activation, and decreased coordination, decreased midline orientation, and decreased sitting balance, decreased standing balance, decreased postural control, hemiplegia, and decreased balance strategies and therefore will continue to benefit from skilled OT intervention to enhance overall performance with BADL and iADL.  Patient progressing toward long term goals..  Continue plan of care.  OT Short Term Goals Week 2:  OT Short Term Goal 1 (Week 2): Pt will transfer to toilet toward the R with min A OT Short Term Goal 1 - Progress (Week 2): Met OT Short Term Goal 2 (Week 2): Pt will don pants with min A OT Short Term Goal 2 - Progress (Week 2): Met OT Short Term Goal 3 (Week 2): Pt will complete UB bathing with CGA OT Short Term  Goal 3 - Progress (Week 2): Met OT Short Term Goal 4 (Week 2): Pt will position LUE appropriately at rest with no cueing OT Short Term Goal 4 - Progress (Week 2): Met Week 3:  OT Short Term Goal 1 (Week 3): Pt will don pants with CGA OT Short Term Goal 2 (Week 3): Pt will complete 3/3 toileting tasks with CGA OT Short Term Goal 3 (Week 3): Pt will transfer to the bsc/toilet with CGA OT Short Term Goal 4 (Week 3): Pt will complete self PROM of his LUE with (S)  Skilled Therapeutic Interventions/Progress Updates:    Pt received supine with no c/o pain, agreeable to OT session. OT donned B shoes at bed level for time management. He stood from EOB, requiring min cueing for hand placement to ensure LLE weightbearing during stand to reduce compensatory techniques, CGA. He then completed functional mobility into the bathroom with a large base quad cane with min A overall- at the trunk for balance and facilitation at the LLE- including neutral swing through and hyperextension blocking. He completed transfer into walk in shower with min A, min cueing for sequencing. He was able to doff all clothing with close (S). He completed UB and LB bathing with CGA during lateral leans, but overall excellent carryover of adaptive/compensatory bathing techniques. OT provided edu/cueing to use LUE during bathing, self HOH facilitation, which pt was able to complete following demo. While pt bathed, discussed coping strategies and healthy daily routine at length. Suggested journaling to deal with anxiety and he stated he was open  to the idea. Pt sharing insight into anxiety and talking at length about unhealthy relationships/strategies for anxiety throughout his life.  He dressed following- mod A to don pants 2/2 time constraints. Shirt with (S). He returned to the w/c with min A using cane. He completed oral care from the w/c with set up assist. Pt was left sitting up in the w/c with all needs met and call bell within reach.      Saebo Stim One was placed on his dorsal wrist for wrist and finger extension activation to maximize NMR and tone management. 60 min unattended e stim, no adverse skin reaction or pain.  330 pulse width 35 Hz pulse rate On 8 sec/ off 8 sec Ramp up/ down 2 sec Symmetrical Biphasic wave form  Max intensity at 500 Ohm load  Therapy Documentation Precautions:  Precautions Precautions: Fall Precaution/Restrictions Comments: left LE tone Restrictions Weight Bearing Restrictions Per Provider Order: No   Therapy/Group: Individual Therapy  Crissie Reese 09/09/2023, 6:37 AM

## 2023-09-09 NOTE — Progress Notes (Signed)
 Occupational Therapy Session Note  Patient Details  Name: Erik Santana. MRN: [id number] Date of Birth: 11-Aug-1990  Today's Date: 09/09/2023 OT Individual Time: 1330-1415 OT Individual Time Calculation (min): 45 min    Short Term Goals: Week 1:  OT Short Term Goal 1 (Week 1): Pt will use left UE as gross stabilization in grooming task OT Short Term Goal 1 - Progress (Week 1): Not met OT Short Term Goal 2 (Week 1): Pt will be given instructions on self ROM to do in bed with left UE OT Short Term Goal 2 - Progress (Week 1): Met OT Short Term Goal 3 (Week 1): Pt will transfer with mod A to toilet OT Short Term Goal 3 - Progress (Week 1): Met Week 2:  OT Short Term Goal 1 (Week 2): Pt will transfer to toilet toward the R with min A OT Short Term Goal 1 - Progress (Week 2): Met OT Short Term Goal 2 (Week 2): Pt will don pants with min A OT Short Term Goal 2 - Progress (Week 2): Met OT Short Term Goal 3 (Week 2): Pt will complete UB bathing with CGA OT Short Term Goal 3 - Progress (Week 2): Met OT Short Term Goal 4 (Week 2): Pt will position LUE appropriately at rest with no cueing OT Short Term Goal 4 - Progress (Week 2): Met  Skilled Therapeutic Interventions/Progress Updates:    1:1 pt received in the w/c. Pt propelled w/c from room to outside the building with cues for body positioning and sequencing steps to propel (mod A). Outside performed 3 bouts of ambulation with AFO and quad cane with min A with facilitation to decr right knee hyperextension- focus on trunk posture, hip positioning and control- decreasing compensatory methods. Each walk was about 40 feet. Focus on control of left UE with sabeo on triceps to promote extension in sitting and in standing. Tried Saebo in walking to decr bicep flexion- improved slightly with having Ue at side.  Without saebo and min A pt able to bring left UE down off chair into elbow extension - hanging at wheel level. Hand would grab wheel but focus  on releasing grasp and bringing Ue back into lap with success !!!! With decr simulation and time. Pt returned to room and left resting in the bed with min A and cues for proper sequencing to get into the bed.    Therapy Documentation Precautions:  Precautions Precautions: Fall Precaution/Restrictions Comments: left LE tone Restrictions Weight Bearing Restrictions Per Provider Order: No General:   Vital Signs: Therapy Vitals Temp: 98 F (36.7 C) Pulse Rate: 90 Resp: 18 BP: 136/88 Patient Position (if appropriate): Lying Oxygen Therapy SpO2: 97 % O2 Device: Room Air Pain:  No c/o pain in session   Therapy/Group: Individual Therapy  [facility] 09/09/2023, 3:40 PM

## 2023-09-10 NOTE — Progress Notes (Signed)
 Occupational Therapy Session Note  Patient Details  Name: Jaquane Boughner. MRN: [id number] Date of Birth: 10/30/1990  Today's Date: 09/10/2023 OT Individual Time: 1335-1420 OT Individual Time Calculation (min): 45 min    Short Term Goals: Week 3:  OT Short Term Goal 1 (Week 3): Pt will don pants with CGA OT Short Term Goal 2 (Week 3): Pt will complete 3/3 toileting tasks with CGA OT Short Term Goal 3 (Week 3): Pt will transfer to the bsc/toilet with CGA OT Short Term Goal 4 (Week 3): Pt will complete self PROM of his LUE with (S)  Skilled Therapeutic Interventions/Progress Updates:    Pt received supine with no c/o pain, agreeable to OT session. He came to EOB with (S). He donned R shoe ,OT donning L. Provided pt with elastic shoe lace for R shoe and replaced laces for improved independence with shoes. He requested to go outside for session. He hemi propelled w/c to outside the hospital, navigating in/out of elevator and over several thresholds with much improved accuracy and control. Once outside worked on [employer] with use of saebo in conjunction with AAROM. He is demonstrating a palmar grasp reflex that often limits extension of fingers. With palm stimuli removed he was able to volitionally activate 75% composite finger extension. Completed AAROM + saebo at bicep/triceps, extensor carpi radialis/extensor digitorum, as well as supraspinatus and middle deltoid. Also utilized [employer] for [employer]. Had lengthy discussion re return to sex after stroke, initiated by pt. Discussed his CLOF and provided several examples of positions to keep himself and a partner safe, taking into account his LUE/LLE tone as well as balance deficits. Pt receptive and thankful for conversation. He returned to his room following. Pt left sitting up with all need met.     Saebo Stim One was placed on his dorsal wrist for wrist and finger extension activation to maximize NMR and tone management. 60 min  unattended e stim, no adverse skin reaction or pain.  330 pulse width 35 Hz pulse rate On 8 sec/ off 8 sec Ramp up/ down 2 sec Symmetrical Biphasic wave form  Max intensity at 500 Ohm load    Therapy Documentation Precautions:  Precautions Precautions: Fall Precaution/Restrictions Comments: left LE tone Restrictions Weight Bearing Restrictions Per Provider Order: No  Therapy/Group: Individual Therapy  Crissie Reese 09/10/2023, 6:45 AM

## 2023-09-10 NOTE — Plan of Care (Signed)
  Problem: RH KNOWLEDGE DEFICIT Goal: RH STG INCREASE KNOWLEDGE OF HYPERTENSION Description: Patient and family will be able to manage HTN with medications and educational resources for dietary modification, lifestyle modification independently. Outcome: Progressing   Problem: RH PAIN MANAGEMENT Goal: RH STG PAIN MANAGED AT OR BELOW PT'S PAIN GOAL Description: < 4 with prns Outcome: Progressing   Problem: RH SAFETY Goal: RH STG ADHERE TO SAFETY PRECAUTIONS W/ASSISTANCE/DEVICE Description: STG Adhere to Safety Precautions With cues Assistance/Device. Outcome: Progressing

## 2023-09-10 NOTE — Progress Notes (Signed)
 PROGRESS NOTE   Subjective/Complaints:  Pt doing well, slept ok, denies pain, LBM this morning, urinating fine, denies any other complaints or concerns today.   ROS: Denies fevers, chills, N/V, abdominal pain, diarrhea, SOB, cough, chest pain, new weakness or paraesthesias.    + insomnia/anxiety-improving + LUE and LLE myoclonus-improving + Spasticity -ongoing/improving  Objective:   No results found. No results for input(s): "WBC", "HGB", "HCT", "PLT" in the last 72 hours.    Recent Labs    09/09/23 0651  NA 136  K 3.9  CL 100  CO2 28  GLUCOSE 95  BUN 8  CREATININE 0.99  CALCIUM 9.1      Intake/Output Summary (Last 24 hours) at 09/10/2023 1355 Last data filed at 09/10/2023 1200 Gross per 24 hour  Intake 833 ml  Output 750 ml  Net 83 ml        Physical Exam: Vital Signs Blood pressure 116/71, pulse 76, temperature 97.9 F (36.6 C), temperature source Oral, resp. rate 16, height 6\' 2"  (1.88 m), weight 78.7 kg, SpO2 97%.  Constitutional: No apparent distress. Appropriate appearance for age.  Sitting upright in w/c. HENT: Atraumatic, normocephalic.  Eyes: PERRLA. EOMI. no obvious vertigo on exam.   Cardiovascular: Regular rate, regular rhythm, no murmurs/rub/gallops. No Edema.  Respiratory: CTAB. No rales, rhonchi, or wheezing. On RA.  Abdomen: + bowel sounds, normoactive. No distention or tenderness.  Skin: C/D/I.   Psych: Less anxious , appropriate, upbeat  PRIOR EXAMS: MSK:     Left upper extremity with e-stim     Left lower extremity in carbon fiber AFO, can range ankle passively past neutral    Neurologic exam:  Cognition: AAO to person, place, time and event.  No apparent cognitive deficits. Insight: Good insight into current condition.  Mood: Anxious mood, elevated but appropriate Sensation: Equal and intact in BL UE and Les.  Reflexes: 2+ in R  UE and LE;  myoclonus in left lower extremity  with passive dorsiflexion.. CN: 2-12 grossly intact.   Spasticity:  Left upper extremity: MAS 1 shoulder abductors, MAS 1+ elbow flexors, MAS 1 elbow extensors; MAS 1 wrist flexors; MAS 0 finger flexors , MAS 0 thumb adductors--with a e-stim on today, unable to extend/abduct fingers  Left lower extremity: MAS 0 hip flexors, MAS 0 knee extensors, MAS 1 knee flexors, MAS 1-0 plantar flexors. --Much improved       Strength:                LUE: 1/5 SA, 2/5 EF, 3/5 EE, 1/5 WE, 3/5 FF, 2/5 FA prior exams                 RUE:  5/5 SA, 5/5 EF, 5/5 EE, 5/5 WE, 5/5 FF, 5/5 FA                LLE: 4/5 HF, 2/5 KE, 0/5  DF, 0/5  EHL, 1/5  PF --unchanged                RLE:  5/5 HF, 5/5 KE, 5/5  DF, 5/5  EHL, 5/5  PF     Assessment/Plan: 1. Functional deficits which require 3+ hours  per day of interdisciplinary therapy in a comprehensive inpatient rehab setting. Physiatrist is providing close team supervision and 24 hour management of active medical problems listed below. Physiatrist and rehab team continue to assess barriers to discharge/monitor patient progress toward functional and medical goals  Care Tool:  Bathing    Body parts bathed by patient: Left arm, Chest, Abdomen, Front perineal area, Right upper leg, Left upper leg, Face, Right arm, Right lower leg, Left lower leg, Buttocks   Body parts bathed by helper: Buttocks, Right lower leg, Left lower leg     Bathing assist Assist Level: Contact Guard/Touching assist     Upper Body Dressing/Undressing Upper body dressing   What is the patient wearing?: Pull over shirt    Upper body assist Assist Level: Supervision/Verbal cueing    Lower Body Dressing/Undressing Lower body dressing      What is the patient wearing?: Pants     Lower body assist Assist for lower body dressing: Minimal Assistance - Patient > 75%     Toileting Toileting    Toileting assist Assist for toileting: Minimal Assistance - Patient > 75%      Transfers Chair/bed transfer  Transfers assist     Chair/bed transfer assist level: Minimal Assistance - Patient > 75%     Locomotion Ambulation   Ambulation assist      Assist level: Minimal Assistance - Patient > 75% Assistive device: Cane-quad Max distance: 20'   Walk 10 feet activity   Assist  Walk 10 feet activity did not occur: Safety/medical concerns (Fatigue)  Assist level: Minimal Assistance - Patient > 75% Assistive device: Parallel bars, Other (comment) (rail)   Walk 50 feet activity   Assist Walk 50 feet with 2 turns activity did not occur: Safety/medical concerns         Walk 150 feet activity   Assist Walk 150 feet activity did not occur: Safety/medical concerns         Walk 10 feet on uneven surface  activity   Assist Walk 10 feet on uneven surfaces activity did not occur: Safety/medical concerns         Wheelchair     Assist Is the patient using a wheelchair?: Yes Type of Wheelchair: Manual    Wheelchair assist level: Supervision/Verbal cueing Max wheelchair distance: 300'    Wheelchair 50 feet with 2 turns activity    Assist        Assist Level: Supervision/Verbal cueing   Wheelchair 150 feet activity     Assist      Assist Level: Supervision/Verbal cueing   Blood pressure 116/71, pulse 76, temperature 97.9 F (36.6 C), temperature source Oral, resp. rate 16, height 6\' 2"  (1.88 m), weight 78.7 kg, SpO2 97%.   Medical Problem List and Plan: 1. 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 Progressing well with therapies.  3-10: Looking into getting better PRAFO for positioning and to prevent contractures in left lower extremity.  Considering elbow  bed sloe for positioning as well, although patient has full range of motion here and is agreeable to  trying to stretch in bed. 3/11: Min A UB ADLs, Mod A LBD, is getting bicep movement and deltoid 1/5 now. With PT Min A transfers, Mod A ambulating 30 ft., progressing L leg independently. Limited by tone.   3.12: Custom AFO consult placed with [employer]  2.  Antithrombotics: -DVT/anticoagulation:  Pharmaceutical: Lovenox d/c'd 3/1             -antiplatelet therapy: N/A   3. Pain Management:  - 2/27: Tylenol 350 to 650 mg every 6 hours for mild to moderate pain, tramadol 50 mg every 6 hours as needed for severe pain.  Has minimal complaints of pain so far, would consider gabapentin if additional needed.  3/3: Using tramadol approximately BID for lower back pain; with benefit.  4. Mood/Behavior/Sleep: Provide emotional support             -antipsychotic agents: N/A -2-27: Was using Xanax at home for sleep.  Has Atarax on board, add as needed melatonin and scheduled trazodone 100 mg nightly.  Will get sleep log.  - 2-28: Sleep improved with trazodone and as needed melatonin; continue.  -3/3: addition of BuSpar 5 mg 3 times daily for anxiety--improving 3/7: Increase BuSpar to 7.5 mg 3 times daily 3-10: Atarax DC'd due to making patient "jumpy" 3-11: Too early to [employer], patient continues to complain of significant anxiety and no sleep.  After considerable back-and-forth discussion, agreed to zolpidem 5 mg nightly as needed while inpatient.  Patient understands this will not be prescribed at discharge. 3-12: Sleep and anxiety improved despite not using zolpidem.  Will still have available as needed--used, with benefit 3-13: Anxiety significantly improved, suspect in part due to Depakote; continue current regimen   5. Neuropsych/cognition: This patient is capable of making decisions on his own behalf. - reporting severe ongoing anxiety with staff, recommending neuropsychiatry and behavioral  assessment as outpatient  - 3-11: Remains in significant anxiety; of note, denies history of drug use outside of marijuana.  6. Skin/Wound Care: Routine skin checks   -DC peripheral IV  7. Fluids/Electrolytes/Nutrition: Routine in and outs with follow-up chemistries   -Potassium slightly low 3.4, otherwise labs stable.  -3-3: AM labs stable  -09/03/23 pt requested vitamin D level for Mondays labs, ordered 3-10: Vitamin D low, start supplementation.  50,000 units once weekly for 4 doses.  8.  Tobacco use.  Continue [name]- consider discussion of decrease in dose given elevated blood pressure.  Provide counseling   9.  History of polysubstance use.  Urine drug screen positive marijuana.  Patient does endorse using Ativan and Percocet off the streets.  Provide counseling -3-11: Patient states he in fact was not taking Ativan or Percocet off the streets, that he had been prescribed these earlier in life and was endorsing that when asked.  Wishes the record to reflect this answer.  He does not deny recreational marijuana use.   10.  Constipation/diarrhea.  Continue Senokot S1 tablet twice daily.   -Multiple liquid stools overnight, large; DC Senokot  Improved -3-6: Increase to Senokot S1 tab twice daily, MiraLAX daily.  Add Anusol as needed.  -09/06/23 LBM yesterday, monitor 3-13: Patient denies any further diarrhea, straining.  Continue current regimen. -09/10/23 LBM this morning, cont regimen  11.  LLE >> LUE Spasticity/myoclonus.  -Ordered left WHO today, may need custom per OT -Increase baclofen from 10 mg twice daily to 10 mg 3 times daily; continue Zanaflex 2 mg every 8 hours as needed for spasms -Monitor with therapy;  would consider addition of antiepileptic if significant myoclonus -Discussed with patient this a.m., may need Botox exam and injection while in inpatient rehab for left finger flexor tightness, will monitor 1 week on medication prior to pursuing -2-28: Tone improved  with increased baclofen; per therapy reports, ongoing issues with LLE myoclonus, could benefit from Depakote for this and ongoing mood/behavior difficulties, but will hold off to see if any continued improvement over the weekend on current medications. 3/2: asked Trey Paula PT if longer foot rest can be applied to wheelchair as patient experiencing clonus with current and feels it is too small  3/3: Using tizanidine and baclofen with benefit 3-5: Myoclonus remains very limiting.  Will avoid Valium due to substance use history; start Depakote 125 mg twice daily.  Will monitor LFTs in next few days. 3-6: Myoclonus improved!  Continue Depakote at current dose, check LFTs on 3/7.  Tone remains severe, will increase baclofen to 15 mg 3 times daily tizanidine has been sedating. 3/7: LFTs normal, myoclonus with effort today but much easier to break and no longer occurring at rest.  Left upper extremity getting some finger and wrist extension back!  Will increase tizanidine to 4 mg 3 times daily and look at adjusting Depakote next week. 3/10 : will look into getting Xeomen samples for injection tomorrow.  Patient agreeable. 3-11: Increase Depakote to 2025 mg every 8 hours.  Perform Xeomin injection as below after discussion of risks and benefits with patient, patient consent and timeout.  The below mentioned sites were cleaned and prepped with alcohol swabs, and muscles were identified by anatomical landmarks and confirmed with use of EMG and stim.  Patient tolerated procedure well without bleeding or side effect.   LLE:   50 units medial gastrocnemius   50 units lateral gastrocnemius   50 units soleus   50 units lateral hamstrings 3-12: Myoclonus and left ankle tone improved today with Depakote increase; doubtful Xeomin injections are kicking in yet.   3/13: LFTS normal; continue current dose  12. Bradycardia/Incomplete bundle branch block - episodes of high 40s/low 50s intermittent throughout hospitalization  -  EKG today NSR 60 bpm, with QTC 440   - No obvious medication contributors - very rare s/e to tramadol  - If  symptomatic, could consider cardiology consult and holter monitor/ziopatch -HR reviewed and appears to have resolved, discussed that lower heart rate at night is physiological  - 3/2: cardiology consulted, discussed with patient that no changes recommended  - 3/4: No bradycardia or sleep apnea on overnight pulse ox  14. Nasal congestion: asked nursing to advise patient not to use home nasal spray as it is a vasoconstrictor and can increase stroke risk  -Using saline nose spray only, can continue  15. Dizziness/vertigo--improved   - Scopalamine patch   - Meclizine 25 mg TID PRN   - 3/4: OT doing vestibular eval today--no peripheral vertigo  3/11: improving 3-13: Has not used as needed meclizine in some time; will DC scopolamine patch  16. Left ankle roll.  No apparent deformity, negative [place] ankle rules for imaging. -Discussed with PT using Aircast for positioning and support given history of frequent ankle rolling -Not bothersome to the patient at this time - 3/11: pain ongoing; xray ordered--patient refused 3/14: Pending custom AFO for ankle pain/support   LOS: 17 days A FACE TO FACE EVALUATION WAS PERFORMED  [address] 09/10/2023, 1:55 PM

## 2023-09-11 NOTE — Progress Notes (Signed)
 PROGRESS NOTE   Subjective/Complaints:  Pt doing well again, slept ok, pain doing ok, LBM yesterday, urinating fine, denies any other complaints or concerns today.   ROS: Denies fevers, chills, N/V, abdominal pain, diarrhea, SOB, cough, chest pain, new weakness or paraesthesias.    + insomnia/anxiety-improving + LUE and LLE myoclonus-improving + Spasticity -ongoing/improving  Objective:   No results found. No results for input(s): "WBC", "HGB", "HCT", "PLT" in the last 72 hours.    Recent Labs    09/09/23 0651  NA 136  K 3.9  CL 100  CO2 28  GLUCOSE 95  BUN 8  CREATININE 0.99  CALCIUM 9.1      Intake/Output Summary (Last 24 hours) at 09/11/2023 1205 Last data filed at 09/11/2023 0900 Gross per 24 hour  Intake 318 ml  Output 625 ml  Net -307 ml        Physical Exam: Vital Signs Blood pressure 134/83, pulse 83, temperature 97.7 F (36.5 C), resp. rate 18, height 6\' 2"  (1.88 m), weight 78.7 kg, SpO2 97%.  Constitutional: No apparent distress. Appropriate appearance for age.  Sitting upright in bed. HENT: Atraumatic, normocephalic.  Eyes: PERRLA. EOMI.   Cardiovascular: Regular rate, regular rhythm, no murmurs/rub/gallops. No Edema.  Respiratory: CTAB. No rales, rhonchi, or wheezing. On RA.  Abdomen: + bowel sounds, normoactive. No distention or tenderness.  Skin: C/D/I.   Psych: Less anxious , appropriate, upbeat  PRIOR EXAMS: MSK:     Left upper extremity with e-stim     Left lower extremity in carbon fiber AFO, can range ankle passively past neutral    Neurologic exam:  Cognition: AAO to person, place, time and event.  No apparent cognitive deficits. Insight: Good insight into current condition.  Mood: Anxious mood, elevated but appropriate Sensation: Equal and intact in BL UE and Les.  Reflexes: 2+ in R  UE and LE;  myoclonus in left lower extremity with passive dorsiflexion.. CN: 2-12 grossly  intact.   Spasticity:  Left upper extremity: MAS 1 shoulder abductors, MAS 1+ elbow flexors, MAS 1 elbow extensors; MAS 1 wrist flexors; MAS 0 finger flexors , MAS 0 thumb adductors--with a e-stim on today, unable to extend/abduct fingers  Left lower extremity: MAS 0 hip flexors, MAS 0 knee extensors, MAS 1 knee flexors, MAS 1-0 plantar flexors. --Much improved       Strength:                LUE: 1/5 SA, 2/5 EF, 3/5 EE, 1/5 WE, 3/5 FF, 2/5 FA prior exams                 RUE:  5/5 SA, 5/5 EF, 5/5 EE, 5/5 WE, 5/5 FF, 5/5 FA                LLE: 4/5 HF, 2/5 KE, 0/5  DF, 0/5  EHL, 1/5  PF --unchanged                RLE:  5/5 HF, 5/5 KE, 5/5  DF, 5/5  EHL, 5/5  PF     Assessment/Plan: 1. Functional deficits which require 3+ hours per day of interdisciplinary therapy in a  comprehensive inpatient rehab setting. Physiatrist is providing close team supervision and 24 hour management of active medical problems listed below. Physiatrist and rehab team continue to assess barriers to discharge/monitor patient progress toward functional and medical goals  Care Tool:  Bathing    Body parts bathed by patient: Left arm, Chest, Abdomen, Front perineal area, Right upper leg, Left upper leg, Face, Right arm, Right lower leg, Left lower leg, Buttocks   Body parts bathed by helper: Buttocks, Right lower leg, Left lower leg     Bathing assist Assist Level: Contact Guard/Touching assist     Upper Body Dressing/Undressing Upper body dressing   What is the patient wearing?: Pull over shirt    Upper body assist Assist Level: Supervision/Verbal cueing    Lower Body Dressing/Undressing Lower body dressing      What is the patient wearing?: Pants     Lower body assist Assist for lower body dressing: Minimal Assistance - Patient > 75%     Toileting Toileting    Toileting assist Assist for toileting: Minimal Assistance - Patient > 75%     Transfers Chair/bed transfer  Transfers assist      Chair/bed transfer assist level: Minimal Assistance - Patient > 75%     Locomotion Ambulation   Ambulation assist      Assist level: Minimal Assistance - Patient > 75% Assistive device: Cane-quad Max distance: 20'   Walk 10 feet activity   Assist  Walk 10 feet activity did not occur: Safety/medical concerns (Fatigue)  Assist level: Minimal Assistance - Patient > 75% Assistive device: Parallel bars, Other (comment) (rail)   Walk 50 feet activity   Assist Walk 50 feet with 2 turns activity did not occur: Safety/medical concerns         Walk 150 feet activity   Assist Walk 150 feet activity did not occur: Safety/medical concerns         Walk 10 feet on uneven surface  activity   Assist Walk 10 feet on uneven surfaces activity did not occur: Safety/medical concerns         Wheelchair     Assist Is the patient using a wheelchair?: Yes Type of Wheelchair: Manual    Wheelchair assist level: Supervision/Verbal cueing Max wheelchair distance: 300'    Wheelchair 50 feet with 2 turns activity    Assist        Assist Level: Supervision/Verbal cueing   Wheelchair 150 feet activity     Assist      Assist Level: Supervision/Verbal cueing   Blood pressure 134/83, pulse 83, temperature 97.7 F (36.5 C), resp. rate 18, height 6\' 2"  (1.88 m), weight 78.7 kg, SpO2 97%.   Medical Problem List and Plan: 1. 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 Progressing well with therapies.  3-10: Looking into getting better PRAFO for positioning and to prevent contractures in left lower extremity.  Considering elbow bed sloe for positioning as well, although patient has full range of motion here  and is agreeable to trying to stretch in bed. 3/11: Min A UB ADLs,  Mod A LBD, is getting bicep movement and deltoid 1/5 now. With PT Min A transfers, Mod A ambulating 30 ft., progressing L leg independently. Limited by tone.   3.12: Custom AFO consult placed with [employer]  2.  Antithrombotics: -DVT/anticoagulation:  Pharmaceutical: Lovenox d/c'd 3/1             -antiplatelet therapy: N/A   3. Pain Management:  - 2/27: Tylenol 350 to 650 mg every 6 hours for mild to moderate pain, tramadol 50 mg every 6 hours as needed for severe pain.  Has minimal complaints of pain so far, would consider gabapentin if additional needed.  3/3: Using tramadol approximately BID for lower back pain; with benefit.  4. Mood/Behavior/Sleep: Provide emotional support             -antipsychotic agents: N/A -2-27: Was using Xanax at home for sleep.  Has Atarax on board, add as needed melatonin and scheduled trazodone 100 mg nightly.  Will get sleep log.  - 2-28: Sleep improved with trazodone and as needed melatonin; continue.  -3/3: addition of BuSpar 5 mg 3 times daily for anxiety--improving 3/7: Increase BuSpar to 7.5 mg 3 times daily 3-10: Atarax DC'd due to making patient "jumpy" 3-11: Too early to [employer], patient continues to complain of significant anxiety and no sleep.  After considerable back-and-forth discussion, agreed to zolpidem 5 mg nightly as needed while inpatient.  Patient understands this will not be prescribed at discharge. 3-12: Sleep and anxiety improved despite not using zolpidem.  Will still have available as needed--used, with benefit  3-13: Anxiety significantly improved, suspect in part due to Depakote; continue current regimen   5. Neuropsych/cognition: This patient is capable of making decisions on his own behalf. - reporting severe ongoing anxiety with staff, recommending neuropsychiatry and behavioral assessment as outpatient  - 3-11: Remains in significant anxiety; of note,  denies history of drug use outside of marijuana.  6. Skin/Wound Care: Routine skin checks   -DC peripheral IV  7. Fluids/Electrolytes/Nutrition: Routine in and outs with follow-up chemistries   -Potassium slightly low 3.4, otherwise labs stable.  -3-3: AM labs stable  -09/03/23 pt requested vitamin D level for Mondays labs, ordered 3-10: Vitamin D low, start supplementation.  50,000 units once weekly for 4 doses.  8.  Tobacco use.  Continue [name]- consider discussion of decrease in dose given elevated blood pressure.  Provide counseling   9.  History of polysubstance use.  Urine drug screen positive marijuana.  Patient does endorse using Ativan and Percocet off the streets.  Provide counseling -3-11: Patient states he in fact was not taking Ativan or Percocet off the streets, that he had been prescribed these earlier in life and was endorsing that when asked.  Wishes the record to reflect this answer.  He does not deny recreational marijuana use.   10.  Constipation/diarrhea.  Continue Senokot S1 tablet twice daily.   -Multiple liquid stools overnight, large; DC Senokot  Improved -3-6: Increase to Senokot S1 tab twice daily, MiraLAX daily.  Add Anusol as needed.  -09/06/23 LBM yesterday, monitor 3-13: Patient denies any further diarrhea, straining.  Continue current regimen. -09/11/23 LBM yesterday, cont regimen  11.  LLE >> LUE Spasticity/myoclonus.  -Ordered left WHO today, may need custom per OT -Increase baclofen from 10 mg twice daily to 10 mg 3 times daily; continue Zanaflex 2 mg every 8 hours as needed for spasms -Monitor with therapy; would consider addition of antiepileptic if significant myoclonus -Discussed with  patient this a.m., may need Botox exam and injection while in inpatient rehab for left finger flexor tightness, will monitor 1 week on medication prior to pursuing -2-28: Tone improved with increased baclofen; per therapy reports, ongoing issues with LLE  myoclonus, could benefit from Depakote for this and ongoing mood/behavior difficulties, but will hold off to see if any continued improvement over the weekend on current medications. 3/2: asked Trey Paula PT if longer foot rest can be applied to wheelchair as patient experiencing clonus with current and feels it is too small  3/3: Using tizanidine and baclofen with benefit 3-5: Myoclonus remains very limiting.  Will avoid Valium due to substance use history; start Depakote 125 mg twice daily.  Will monitor LFTs in next few days. 3-6: Myoclonus improved!  Continue Depakote at current dose, check LFTs on 3/7.  Tone remains severe, will increase baclofen to 15 mg 3 times daily tizanidine has been sedating. 3/7: LFTs normal, myoclonus with effort today but much easier to break and no longer occurring at rest.  Left upper extremity getting some finger and wrist extension back!  Will increase tizanidine to 4 mg 3 times daily and look at adjusting Depakote next week. 3/10 : will look into getting Xeomen samples for injection tomorrow.  Patient agreeable. 3-11: Increase Depakote to 2025 mg every 8 hours.  Perform Xeomin injection as below after discussion of risks and benefits with patient, patient consent and timeout.  The below mentioned sites were cleaned and prepped with alcohol swabs, and muscles were identified by anatomical landmarks and confirmed with use of EMG and stim.  Patient tolerated procedure well without bleeding or side effect.   LLE:   50 units medial gastrocnemius   50 units lateral gastrocnemius   50 units soleus   50 units lateral hamstrings 3-12: Myoclonus and left ankle tone improved today with Depakote increase; doubtful Xeomin injections are kicking in yet.   3/13: LFTS normal; continue current dose  12. Bradycardia/Incomplete bundle branch block - episodes of high 40s/low 50s intermittent throughout hospitalization  - EKG today NSR 60 bpm, with QTC 440   - No obvious medication  contributors - very rare s/e to tramadol  - If  symptomatic, could consider cardiology consult and holter monitor/ziopatch -HR reviewed and appears to have resolved, discussed that lower heart rate at night is physiological  - 3/2: cardiology consulted, discussed with patient that no changes recommended  - 3/4: No bradycardia or sleep apnea on overnight pulse ox  14. Nasal congestion: asked nursing to advise patient not to use home nasal spray as it is a vasoconstrictor and can increase stroke risk  -Using saline nose spray only, can continue  15. Dizziness/vertigo--improved   - Scopalamine patch   - Meclizine 25 mg TID PRN   - 3/4: OT doing vestibular eval today--no peripheral vertigo  3/11: improving 3-13: Has not used as needed meclizine in some time; will DC scopolamine patch  16. Left ankle roll.  No apparent deformity, negative [place] ankle rules for imaging. -Discussed with PT using Aircast for positioning and support given history of frequent ankle rolling -Not bothersome to the patient at this time - 3/11: pain ongoing; xray ordered--patient refused 3/14: Pending custom AFO for ankle pain/support   LOS: 18 days A FACE TO FACE EVALUATION WAS PERFORMED  [address] 09/11/2023, 12:05 PM

## 2023-09-11 NOTE — Progress Notes (Signed)
 Occupational Therapy Session Note  Patient Details  Name: Erik Santana. MRN: [id number] Date of Birth: 1991/05/15  Today's Date: 09/11/2023 OT Individual Time: 4098-1191 OT Individual Time Calculation (min): 60 min    Short Term Goals: Week 1:  OT Short Term Goal 1 (Week 1): Pt will use left UE as gross stabilization in grooming task OT Short Term Goal 1 - Progress (Week 1): Not met OT Short Term Goal 2 (Week 1): Pt will be given instructions on self ROM to do in bed with left UE OT Short Term Goal 2 - Progress (Week 1): Met OT Short Term Goal 3 (Week 1): Pt will transfer with mod A to toilet OT Short Term Goal 3 - Progress (Week 1): Met Week 2:  OT Short Term Goal 1 (Week 2): Pt will transfer to toilet toward the R with min A OT Short Term Goal 1 - Progress (Week 2): Met OT Short Term Goal 2 (Week 2): Pt will don pants with min A OT Short Term Goal 2 - Progress (Week 2): Met OT Short Term Goal 3 (Week 2): Pt will complete UB bathing with CGA OT Short Term Goal 3 - Progress (Week 2): Met OT Short Term Goal 4 (Week 2): Pt will position LUE appropriately at rest with no cueing OT Short Term Goal 4 - Progress (Week 2): Met  Skilled Therapeutic Interventions/Progress Updates:    1:1 Pt received in the bed and sweating- assisted with doffing shirt. Pt able to come to EOB with min guard with left LE extended from tone. Pt with significant UE flexor synergy pattern this morning with scapular retracted. Pt assisted into w/c with squat pivot with min A with facilitation to maintaining position of left LE due to tone. Same method for transfer to the toilet and then come into standing with min to mod A (due to tone and no AFO on at this time). Pt transferred back into w/c and then into shower. PT able to bathe 9/10 parts with setup and lateral leans with contact guard for washing buttocks. Focus on use of left hand to wash right arm and axillary area with mod A with initiation of movement and  decr palmar grasp reflex. In dressing focus on again positioning and relaxation of left UE and hand. Pt with increased ability to maintain an open grasp today. Pt with increased heart rate throughout session - 90s-100. Pt able to thread underwear, pants, and socks with extra time (with figure 4 position with min A). Pt ambulated ~ 40 feet twice with shoes and AFO but with increased dizziness today. Cues for step length and left knee control with facilitation to avoid hyperextension. Pt left sitting up in w/c at bedside.   Therapy Documentation Precautions:  Precautions Precautions: Fall Precaution/Restrictions Comments: left LE tone Restrictions Weight Bearing Restrictions Per Provider Order: No  Pain: Pain Assessment Pain Scale: 0-10 Pain Score: 7  Pain Location: Head reports increased dizziness with mobility today - provided breaks as needed    Therapy/Group: Individual Therapy  Roney Mans [facility] 09/11/2023, 12:30 PM

## 2023-09-12 LAB — CBC
HCT: 46.2 % (ref 39.0–52.0)
Hemoglobin: 15.9 g/dL (ref 13.0–17.0)
MCH: 29 pg (ref 26.0–34.0)
MCHC: 34.4 g/dL (ref 30.0–36.0)
MCV: 84.2 fL (ref 80.0–100.0)
Platelets: 348 10*3/uL (ref 150–400)
RBC: 5.49 MIL/uL (ref 4.22–5.81)
RDW: 12.5 % (ref 11.5–15.5)
WBC: 5.5 10*3/uL (ref 4.0–10.5)
nRBC: 0 % (ref 0.0–0.2)

## 2023-09-12 LAB — BASIC METABOLIC PANEL
Anion gap: 7 (ref 5–15)
BUN: 10 mg/dL (ref 6–20)
CO2: 28 mmol/L (ref 22–32)
Calcium: 9.1 mg/dL (ref 8.9–10.3)
Chloride: 102 mmol/L (ref 98–111)
Creatinine, Ser: 0.91 mg/dL (ref 0.61–1.24)
GFR, Estimated: >60 mL/min (ref >60)
Glucose, Bld: 89 mg/dL (ref 70–99)
Potassium: 3.8 mmol/L (ref 3.5–5.1)
Sodium: 137 mmol/L (ref 135–145)

## 2023-09-12 MED ORDER — SCOPOLAMINE 1 MG/3DAYS TD PT72
1.0000 | MEDICATED_PATCH | TRANSDERMAL | Status: DC
  Administered 2023-09-12 – 2023-09-21 (×4): 1.5 mg via TRANSDERMAL
  Filled 2023-09-12 (×3): qty 1

## 2023-09-12 NOTE — Progress Notes (Signed)
 Uneventful night. PRN ambien given at 2323. LUE and LLE splints applied at HS. Patient Reports wearing teds during day, not wearing SCD's at HS. Alfredo Martinez A

## 2023-09-12 NOTE — Progress Notes (Signed)
 Physical Therapy Weekly Progress Note  Patient Details  Name: Erik Santana. MRN: [id number] Date of Birth: 1991-01-05  Beginning of progress report period: September 02, 2023 End of progress report period: September 12, 2023  {CHL IP REHAB PT TIME CALCULATION:304800500}  Patient has met {number 1-5:22450} of {number 1-5:20334} short term goals.  ***  Patient continues to demonstrate the following deficits {impairments:3041632} and therefore will continue to benefit from skilled PT intervention to increase functional independence with mobility.  Patient {LTG progression:3041653}.  {plan of XBJY:7829562}  PT Short Term Goals {ZHY:8657846}  Skilled Therapeutic Interventions/Progress Updates:     Pt received seated in [facility] and agrees to therapy. No complaint of pain. Pt self propels WC x150' to gym with RUE and RLE, with cues for body mechanics and efficiency. Pt steps up onto biodex with minA and cues for step sequencing, with Rt hand rail. Pt stands on biodex without Upper extremity support to assess weight distribution and provide pt with visual feedback on R/L weight distribution. Pt is able to achieve symmetrical weight bearing without upper extremity support with cues for posture, and not requiring external assistance. Pt then completes "catch" activity on biodex to promote more dynamic weight shifting with visual feedback. Following, pt steps down with minA and cues for positioning and step sequencing. Following rest break, pt ambulates x100' with modA under pt's Lt arm with cues for neutral hip rotation, Lt lateral weight shifting, preventing compensations and vaulting on RLE, as well as encouraging external rotation of Lt hip due to pt's tendency for Lt hip internal rotation in open chain movement.   Pt transfer onto mat table with cues for sequencing. Pt performs sit to supine with cues for positioning. Pt performs 3x10 supine Lt hip external rotation for NMR, with cues to limit RLE  compensations, as well as 3x10 bridges in hooklying with green theraband for resistance and to promote increased hip abductor activation, and 3x10 clamshells without resistance, with PT providing verbal and tactile cues for correct body mechanics and performance. Squat pivot back to [facility] with same cues. Left seated in WC with alarm intact and all needs within reach.   Therapy Documentation Precautions:  Precautions Precautions: Fall Precaution/Restrictions Comments: left LE tone Restrictions Weight Bearing Restrictions Per Provider Order: No   Therapy/Group: Individual Therapy  HULEN MANDLER 09/12/2023, 11:37 AM

## 2023-09-12 NOTE — Progress Notes (Signed)
 PROGRESS NOTE   Subjective/Complaints:  No acute complaints.  No events overnight.  Labs this a.m. stable..  Patient seen working with PT this a.m., request scopolamine patch put back on due to ongoing dizziness.  This is overall better, and using as needed meclizine does help, but he feels that his symptoms were better controlled with the patch.  Denies any side effects.  ROS: Denies fevers, chills, N/V, abdominal pain, diarrhea, SOB, cough, chest pain, new weakness or paraesthesias.    + insomnia/anxiety-improving + LUE and LLE myoclonus-improving + Spasticity -ongoing/improving + Dizziness/vertigo-improving but persistent Objective:   No results found. Recent Labs    09/12/23 0511  WBC 5.5  HGB 15.9  HCT 46.2  PLT 348      Recent Labs    09/12/23 0511  NA 137  K 3.8  CL 102  CO2 28  GLUCOSE 89  BUN 10  CREATININE 0.91  CALCIUM 9.1      Intake/Output Summary (Last 24 hours) at 09/12/2023 0749 Last data filed at 09/12/2023 0438 Gross per 24 hour  Intake 598 ml  Output 2100 ml  Net -1502 ml        Physical Exam: Vital Signs Blood pressure 110/70, pulse 81, temperature 98.9 F (37.2 C), resp. rate 16, height 6\' 2"  (1.88 m), weight 78.7 kg, SpO2 96%.  Constitutional: No apparent distress. Appropriate appearance for age.  Laying in bed. HENT: Atraumatic, normocephalic.  Eyes: PERRLA. EOMI. no obvious nystagmus, is symptomatic with vertigo on superior gaze. Cardiovascular: Regular rate, regular rhythm, no murmurs/rub/gallops. No Edema.  Respiratory: CTAB. No rales, rhonchi, or wheezing. On RA.  Abdomen: + bowel sounds, normoactive. No distention or tenderness.  Skin: C/D/I.   Psych: Appropriate mood and affect.  Less anxious than prior exams.   MSK:         Left lower extremity in PRAFO, can range ankle passively to neutral    Neurologic exam:  Cognition: AAO to person, place, time and event.   No apparent cognitive deficits. Insight: Good insight into current condition.  Mood: Anxious mood, elevated but appropriate Sensation: Equal and intact in BL UE and Les.  Reflexes: 2+ in R  UE and LE; no myoclonus in left lower extremity with passive dorsiflexion.. CN: 2-12 grossly intact.   Spasticity:  Left upper extremity: MAS 1 shoulder abductors, MAS 1+ elbow flexors, MAS 1 elbow extensors; MAS 1 wrist flexors; MAS 0 finger flexors , MAS 0 thumb adductors--much better able to relax fingers today  Left lower extremity: MAS 0 hip flexors, MAS 0 knee extensors, MAS 1 knee flexors, MAS 2-3 plantar flexors.       Strength:                LUE: 1/5 SA, 2/5 EF, 3/5 EE, 1/5 WE, 3/5 FF, 2/5 FA -unchanged                RUE:  5/5 SA, 5/5 EF, 5/5 EE, 5/5 WE, 5/5 FF, 5/5 FA                LLE: 4/5 HF, 2/5 KE, 0/5  DF, 0/5  EHL, 1/5  PF --unchanged  RLE:  5/5 HF, 5/5 KE, 5/5  DF, 5/5  EHL, 5/5  PF     Assessment/Plan: 1. Functional deficits which require 3+ hours per day of interdisciplinary therapy in a comprehensive inpatient rehab setting. Physiatrist is providing close team supervision and 24 hour management of active medical problems listed below. Physiatrist and rehab team continue to assess barriers to discharge/monitor patient progress toward functional and medical goals  Care Tool:  Bathing    Body parts bathed by patient: Left arm, Chest, Abdomen, Front perineal area, Right upper leg, Left upper leg, Face, Right arm, Right lower leg, Left lower leg, Buttocks   Body parts bathed by helper: Buttocks, Right lower leg, Left lower leg     Bathing assist Assist Level: Contact Guard/Touching assist     Upper Body Dressing/Undressing Upper body dressing   What is the patient wearing?: Pull over shirt    Upper body assist Assist Level: Supervision/Verbal cueing    Lower Body Dressing/Undressing Lower body dressing      What is the patient wearing?: Pants      Lower body assist Assist for lower body dressing: Minimal Assistance - Patient > 75%     Toileting Toileting    Toileting assist Assist for toileting: Minimal Assistance - Patient > 75%     Transfers Chair/bed transfer  Transfers assist     Chair/bed transfer assist level: Minimal Assistance - Patient > 75%     Locomotion Ambulation   Ambulation assist      Assist level: Minimal Assistance - Patient > 75% Assistive device: Cane-quad Max distance: 20'   Walk 10 feet activity   Assist  Walk 10 feet activity did not occur: Safety/medical concerns (Fatigue)  Assist level: Minimal Assistance - Patient > 75% Assistive device: Parallel bars, Other (comment) (rail)   Walk 50 feet activity   Assist Walk 50 feet with 2 turns activity did not occur: Safety/medical concerns         Walk 150 feet activity   Assist Walk 150 feet activity did not occur: Safety/medical concerns         Walk 10 feet on uneven surface  activity   Assist Walk 10 feet on uneven surfaces activity did not occur: Safety/medical concerns         Wheelchair     Assist Is the patient using a wheelchair?: Yes Type of Wheelchair: Manual    Wheelchair assist level: Supervision/Verbal cueing Max wheelchair distance: 300'    Wheelchair 50 feet with 2 turns activity    Assist        Assist Level: Supervision/Verbal cueing   Wheelchair 150 feet activity     Assist      Assist Level: Supervision/Verbal cueing   Blood pressure 110/70, pulse 81, temperature 98.9 F (37.2 C), resp. rate 16, height 6\' 2"  (1.88 m), weight 78.7 kg, SpO2 96%.   Medical Problem List and Plan: 1. 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 Progressing well with therapies.  3-10: Looking into getting better PRAFO for positioning and to prevent contractures in  left lower extremity.  Considering elbow bed sloe for positioning as well, although patient has full range of motion here and is agreeable to trying to stretch in bed. 3/11: Min A UB ADLs, Mod A LBD, is getting bicep movement and deltoid 1/5 now. With PT Min A transfers, Mod A ambulating 30 ft., progressing L leg independently. Limited by tone.   3.12: Custom AFO consult placed with [facility]  3-17: OT reaching out to orthotics about elbow brace; possible bedsloe versus custom  2.  Antithrombotics: -DVT/anticoagulation:  Pharmaceutical: Lovenox d/c'd 3/1             -antiplatelet therapy: N/A   3. Pain Management:  - 2/27: Tylenol 350 to 650 mg every 6 hours for mild to moderate pain, tramadol 50 mg every 6 hours as needed for severe pain.  Has minimal complaints of pain so far, would consider gabapentin if additional needed.  3/3: Using tramadol approximately BID for lower back pain; with benefit.  4. Mood/Behavior/Sleep: Provide emotional support             -antipsychotic agents: N/A -2-27: Was using Xanax at home for sleep.  Has Atarax on board, add as needed melatonin and scheduled trazodone 100 mg nightly.  Will get sleep log.  - 2-28: Sleep improved with trazodone and as needed melatonin; continue.  -3/3: addition of BuSpar 5 mg 3 times daily for anxiety--improving 3/7: Increase BuSpar to 7.5 mg 3 times daily 3-10: Atarax DC'd due to making patient "jumpy" 3-11: Too early to [employer], patient continues to complain of significant anxiety and no sleep.  After considerable back-and-forth discussion, agreed to zolpidem 5 mg nightly as needed while inpatient.  Patient understands this will not be prescribed at discharge. 3-12: Sleep and anxiety improved despite not using zolpidem.  Will still have available as needed--used, with benefit  3-13: Anxiety  significantly improved, suspect in part due to Depakote; continue current regimen 3-17: Sleeping well, on stable regimen.  LFTs okay.  Will transition Depakote to long-acting this week.   5. Neuropsych/cognition: This patient is capable of making decisions on his own behalf. - reporting severe ongoing anxiety with staff, recommending neuropsychiatry and behavioral assessment as outpatient  - 3-11: Remains in significant anxiety; of note, denies history of drug use outside of marijuana.  6. Skin/Wound Care: Routine skin checks   -DC peripheral IV  7. Fluids/Electrolytes/Nutrition: Routine in and outs with follow-up chemistries   -Potassium slightly low 3.4, otherwise labs stable.  -3-3: AM labs stable  -09/03/23 pt requested vitamin D level for Mondays labs, ordered 3-10: Vitamin D low, start supplementation.  50,000 units once weekly for 4 doses.  8.  Tobacco use.  Continue [name]- consider discussion of decrease in dose given elevated blood pressure.  Provide counseling   9.  History of polysubstance use.  Urine drug screen positive marijuana.  Patient does endorse using Ativan and Percocet off the streets.  Provide counseling -3-11: Patient states he in fact was not taking Ativan or Percocet off the streets, that he had been prescribed these earlier in life and was endorsing that when asked.  Wishes the record to reflect this answer.  He does not deny recreational marijuana use.   10.  Constipation/diarrhea.  Continue Senokot S1 tablet twice daily.   -Multiple liquid stools overnight, large; DC Senokot  Improved -3-6: Increase to Senokot S1 tab twice daily, MiraLAX daily.  Add Anusol as needed.  -09/06/23 LBM yesterday, monitor 3-13: Patient denies any further  diarrhea, straining.  Continue current regimen. -09/11/23 LBM yesterday, cont regimen  11.  LLE >> LUE Spasticity/myoclonus.  -Ordered left WHO today, may need custom per OT -Increase baclofen from 10 mg twice daily to 10  mg 3 times daily; continue Zanaflex 2 mg every 8 hours as needed for spasms -Monitor with therapy; would consider addition of antiepileptic if significant myoclonus -Discussed with patient this a.m., may need Botox exam and injection while in inpatient rehab for left finger flexor tightness, will monitor 1 week on medication prior to pursuing -2-28: Tone improved with increased baclofen; per therapy reports, ongoing issues with LLE myoclonus, could benefit from Depakote for this and ongoing mood/behavior difficulties, but will hold off to see if any continued improvement over the weekend on current medications. 3/2: asked Trey Paula PT if longer foot rest can be applied to wheelchair as patient experiencing clonus with current and feels it is too small  3/3: Using tizanidine and baclofen with benefit 3-5: Myoclonus remains very limiting.  Will avoid Valium due to substance use history; start Depakote 125 mg twice daily.  Will monitor LFTs in next few days. 3-6: Myoclonus improved!  Continue Depakote at current dose, check LFTs on 3/7.  Tone remains severe, will increase baclofen to 15 mg 3 times daily tizanidine has been sedating. 3/7: LFTs normal, myoclonus with effort today but much easier to break and no longer occurring at rest.  Left upper extremity getting some finger and wrist extension back!  Will increase tizanidine to 4 mg 3 times daily and look at adjusting Depakote next week. 3/10 : will look into getting Xeomen samples for injection tomorrow.  Patient agreeable. 3-11: Increase Depakote to 2025 mg every 8 hours.  Perform Xeomin injection as below after discussion of risks and benefits with patient, patient consent and timeout.  The below mentioned sites were cleaned and prepped with alcohol swabs, and muscles were identified by anatomical landmarks and confirmed with use of EMG and stim.  Patient tolerated procedure well without bleeding or side effect.   LLE:   50 units medial gastrocnemius   50  units lateral gastrocnemius   50 units soleus   50 units lateral hamstrings 3-12: Myoclonus and left ankle tone improved today with Depakote increase; doubtful Xeomin injections are kicking in yet.   3/13: LFTS normal; continue current dose  12. Bradycardia/Incomplete bundle branch block - episodes of high 40s/low 50s intermittent throughout hospitalization  - EKG today NSR 60 bpm, with QTC 440   - No obvious medication contributors - very rare s/e to tramadol  - If  symptomatic, could consider cardiology consult and holter monitor/ziopatch -HR reviewed and appears to have resolved, discussed that lower heart rate at night is physiological  - 3/2: cardiology consulted, discussed with patient that no changes recommended  - 3/4: No bradycardia or sleep apnea on overnight pulse ox  14. Nasal congestion: asked nursing to advise patient not to use home nasal spray as it is a vasoconstrictor and can increase stroke risk  -Using saline nose spray only, can continue  15. Dizziness/vertigo--improved   - Scopalamine patch   - Meclizine 25 mg TID PRN   - 3/4: OT doing vestibular eval today--no peripheral vertigo  3/11: improving 3-13: Has not used as needed meclizine in some time; will DC scopolamine patch 3-17: Resume scopolamine patch per patient request  16. Left ankle roll.  No apparent deformity, negative [place] ankle rules for imaging. -Discussed with PT using Aircast for positioning and support given  history of frequent ankle rolling -Not bothersome to the patient at this time - 3/11: pain ongoing; xray ordered--patient refused 3/14: Pending custom AFO for ankle pain/support   LOS: 19 days A FACE TO FACE EVALUATION WAS PERFORMED  Angelina Sheriff 09/12/2023, 7:49 AM

## 2023-09-12 NOTE — Progress Notes (Signed)
 Occupational Therapy Session Note  Patient Details  Name: Erik Santana. MRN: [id number] Date of Birth: 1990/09/12  Session 1 Today's Date: 09/12/2023 OT Individual Time: 6962-9528 OT Individual Time Calculation (min): 51 min   Session 2  Today's Date: 09/12/2023 OT Individual Time: 4132-4401 OT Individual Time Calculation (min): 28 min   Session 3  Today's Date: 09/12/2023 OT Individual Time: 0272-5366 OT Individual Time Calculation (min): 73 min    Short Term Goals: Week 3:  OT Short Term Goal 1 (Week 3): Pt will don pants with CGA OT Short Term Goal 2 (Week 3): Pt will complete 3/3 toileting tasks with CGA OT Short Term Goal 3 (Week 3): Pt will transfer to the bsc/toilet with CGA OT Short Term Goal 4 (Week 3): Pt will complete self PROM of his LUE with (S)  Skilled Therapeutic Interventions/Progress Updates:    Session 1 Pt received supine with no c/o pain, agreeable to OT session. He was instructed in self PROM of the LLE and he was able to follow demonstration using both his R hand and a gait belt to reduce hip external rotation while bringing L hip into flexion. He completed a squat pivot toward the L with CGA into the w/c. He was taken into the bathroom and he completed another squat pivot with the grab bar with min A. He was able to doff LB clothing with CGA for sitting balance. He completed UB bathing with introduction of a wash mit on the L hand. Today he demonstrated improved volitional movement of the L fingers, both in flexion and extension. With min facilitation he was able to bring LUE under RUE to wash armpit- improved elbow flexion as well. Discussed importance of LUE inclusion in self care for NMR. He complete LB bathing seated with lateral leans with close (S). He returned to the w/c following. D/t time constraint extra assist provided to don clothes. He was left sitting up in the w/c at the sink brushing teeth.    Session 2 Pt received in the w/c with no c/o  pain. OT provided PROM to all LUE joint to maintain muscle length and flexibility. Kinesiotape was applied to encourage finger and wrist extension, as well as applied to the medial border of his scapula to reduce resting retraction. Mellissa Kohut was applied to his L tricep to promote elbow extension with great response. Parameters below. 60 min unattended e-stim. Pt passed off to PT in room.    Session 3 Pt received sitting in the w/c with no c/o pain, agreeable to OT session. New AFO on. He completed 120 ft of functional mobility to the therapy gym with the large base quad cane. He required min A at the trunk and min-mod facilitation at the LLE to prevent circumduction and internal rotation, cueing for increased knee/hip flexion. Pt completed blocked practice sit <> stand, 3x10 repetitions with no UE support, to challenge generalized strengthening for ADL transfers, as well as increasing functional activity tolerance and cardiorespiratory endurance. Pt required min A at the LLE to encourage weightbearing over limb during stance. He then used the Kinetron to address volitional hip flex/ext from seated and standing position for tone management and improved strengthening for ADL transfers. He had what seemed to be an anxiety/panic attack during session, somatic symptoms including sweating, as well as pt panicking and getting frantic in behavior. His BP was also assessed and was initially 150/100 but within several minutes with cueing for using mindfulness strategies his BP decreased to 135/75. Session  was moved outside to assist with psychosocial adjustment and emotional support. Discussed mental health at length. Pt was taken back inside where he consumed a special st patricks day ice cream float. Pt engaged in mirror therapy with his R/LUE to promote L NMR. He was able to activate full flexion/extension of L hand using the mirror. He was taken back to his room and left sitting up with all needs met.    Therapy  Documentation Precautions:  Precautions Precautions: Fall Precaution/Restrictions Comments: left LE tone Restrictions Weight Bearing Restrictions Per Provider Order: No  Therapy/Group: Individual Therapy  Crissie Reese 09/12/2023, 6:35 AM

## 2023-09-13 MED ORDER — LORATADINE 10 MG PO TABS
10.0000 mg | ORAL_TABLET | Freq: Every day | ORAL | Status: DC
Start: 2023-09-13 — End: 2023-09-22
  Administered 2023-09-13 – 2023-09-22 (×10): 10 mg via ORAL
  Filled 2023-09-13 (×10): qty 1

## 2023-09-13 MED ORDER — DIVALPROEX SODIUM ER 500 MG PO TB24
500.0000 mg | ORAL_TABLET | Freq: Every day | ORAL | Status: DC
  Administered 2023-09-14 – 2023-09-22 (×9): 500 mg via ORAL
  Filled 2023-09-13 (×9): qty 1

## 2023-09-13 MED ORDER — LIDOCAINE 5 % EX PTCH
1.0000 | MEDICATED_PATCH | CUTANEOUS | Status: DC
  Administered 2023-09-13: 1 via TRANSDERMAL
  Filled 2023-09-13: qty 1

## 2023-09-13 MED ORDER — HYDROXYZINE HCL 25 MG PO TABS
25.0000 mg | ORAL_TABLET | Freq: Three times a day (TID) | ORAL | Status: DC | PRN
  Administered 2023-09-13 – 2023-09-21 (×14): 25 mg via ORAL
  Filled 2023-09-13 (×14): qty 1

## 2023-09-13 MED ORDER — BUSPIRONE HCL 10 MG PO TABS
10.0000 mg | ORAL_TABLET | Freq: Three times a day (TID) | ORAL | Status: DC
  Administered 2023-09-13 – 2023-09-22 (×27): 10 mg via ORAL
  Filled 2023-09-13 (×27): qty 1

## 2023-09-13 NOTE — Progress Notes (Signed)
 Physical Therapy Session Note  Patient Details  Name: Erik Santana. MRN: [id number] Date of Birth: [birth year]  Today's Date: 09/13/2023 PT Individual Time: 1105-1200 PT Individual Time Calculation (min): 55 min   Short Term Goals: Week 3:  PT Short Term Goal 1 (Week 3): STGs = LTGs  Skilled Therapeutic Interventions/Progress Updates:     Pt received seated in WC and agrees to therapy. Reports pain in back> PT provides rest breaks as needed and pt alerts doctor to pain. Pt self propels WC x150' to gym with RUE and RLE, with cues for body mechanics. Pt performs stand activities in parallel bars for Lt hemibody NMR and balance training. Pt performs sit to stand multiple times during session with close supervision and cues for hand placement and increasing loading through LLE. In standing, pt performs slow leg lifts with RLE to promote Lt lateral weight shifting, with RUE support and mirror provided for visual feedback. Pt performs x10 leg lifts with LLE, with cue to envision kicking a ball to promote optimal coordination and prevent hip abduction and circumduction movement pattern. Pt then performs repeated setep ups on 6 inch step with LLE on step and PT providing tactile cueing at distal thigh to promote improved contraction and NM feedback. PT also provides cueing for body mechanics, avoiding compensations, and increasing hip extension during step ups to promote optimal sequencing of movement pattern. Pt completes 3x20 steps ups with seated rest breaks. WC transport back to room. Left seated with all needs within reach.   Therapy Documentation Precautions:  Precautions Precautions: Fall Precaution/Restrictions Comments: left LE tone Restrictions Weight Bearing Restrictions Per Provider Order: No    Therapy/Group: Individual Therapy  [name], PT, DPT 09/13/2023, 4:53 PM

## 2023-09-13 NOTE — Plan of Care (Signed)
  Problem: Consults Goal: RH STROKE PATIENT EDUCATION Description: See Patient Education module for education specifics  Outcome: Progressing   Problem: RH BOWEL ELIMINATION Goal: RH STG MANAGE BOWEL WITH ASSISTANCE Description: STG Manage Bowel with mod I Assistance. Outcome: Progressing Goal: RH STG MANAGE BOWEL W/MEDICATION W/ASSISTANCE Description: STG Manage Bowel with Medication with  mod I Assistance. Outcome: Progressing   Problem: RH SAFETY Goal: RH STG ADHERE TO SAFETY PRECAUTIONS W/ASSISTANCE/DEVICE Description: STG Adhere to Safety Precautions With cues Assistance/Device. Outcome: Progressing   Problem: RH PAIN MANAGEMENT Goal: RH STG PAIN MANAGED AT OR BELOW PT'S PAIN GOAL Description: < 4 with prns Outcome: Progressing   Problem: RH KNOWLEDGE DEFICIT Goal: RH STG INCREASE KNOWLEDGE OF HYPERTENSION Description: Patient and family will be able to manage HTN with medications and educational resources for dietary modification, lifestyle modification independently. Outcome: Progressing Goal: RH STG INCREASE KNOWLEGDE OF HYPERLIPIDEMIA Description: Patient and family will be able to manage HLD with medications and educational resources for dietary modification, lifestyle modification independently. Outcome: Progressing

## 2023-09-13 NOTE — Patient Care Conference (Signed)
 Inpatient RehabilitationTeam Conference and Plan of Care Update Date: 09/13/2023   Time: 1026 am    Patient Name: Erik Santana.      Medical Record Number: [id number]  Date of Birth: 04/19/1991 Sex: Male         Room/Bed: 4W01C/4W01C-01 Payor Info: Payor: [place] MEDICAID PREPAID HEALTH PLAN / Plan: [place] MEDICAID [facility] / Product Type: *No Product type* /    Admit Date/Time:  08/24/2023  3:07 PM  Primary Diagnosis:  Intraparenchymal hematoma of brain [facility])  Hospital Problems: Principal Problem:   Intraparenchymal hematoma of brain [facility]) Active Problems:   Cognitive change    Expected Discharge Date: Expected Discharge Date: 09/22/23  Team Members Present: Physician leading conference: Dr. Elijah Birk Social Worker Present: Cecile Sheerer, LCSWA Nurse Present: Konrad Dolores, RN PT Present: Malachi Pro, PT OT Present: Jake Shark, OT SLP Present: Other (comment) Alvera Novel SLP) PPS Coordinator present : Fae Pippin, SLP     Current Status/Progress Goal Weekly Team Focus  Bowel/Bladder   Pt is continent of bwel and bladder   pt will maintain continence   Will assess qshift and PRN    Swallow/Nutrition/ Hydration               ADL's   Continues to make great improvement. can complete a squat pivot with CGA. (S) UB ADLs, min A LB, min A toileting. LUE with improved finger flexion and extension, as well as elbow flexion.   (S) to min A   LUE NMR, tone management, ADLs, transfers, family edu/d.c planning, standing balance    Mobility   supervision bed mobility, CGA transfers, modA gait x100' without AD   supervision transfers, minA ambulation  Lt hemibody NMR, ambulation, coordination, family ed    Communication                Safety/Cognition/ Behavioral Observations               Pain   No c/o pain   pt maintain pain free   Assess qshift and PRN    Skin   Skin is intact   Maintain skin integrity  Assess skin qshift and PRN       Discharge Planning:  Pt now has managed Medicaid-[employer]. Pt will discharge to home with his mother who will be primary caregiver. Pt mother works first shift; pt sister to help as she works second shift. per mother, sister will stop working and move to 3rd shift if needed. SW will confirm there are no barriers to discharge.   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 Patient with spasticity, pain, anxiety: medication adjusted by MD. Patient limited by  dizziness and panic attacks.  Patient on target to meet rehab goals: yes, Patient continues to make great improvement. can complete a squat pivot with CGA. Patient requires supervision with  UB ADLs. Patient requires  min A lower body care. Patient requires min A  with toileting. Patient requires CGA with transfers and requires  modA with ambulation up to 100' without AD. Overall goals for discharge are set at supervision - min assistance.  *See Care Plan and progress notes for long and short-term goals.   Revisions to Treatment Plan:  Mirror therapy Recreational therapy Coping strategies  Teaching Needs: Safety, medications, toileting , transfers, dietary recommendations,polysubstance and tobacco cessation education, etc.    Current Barriers to Discharge: Decreased caregiver support  Possible Resolutions to Barriers: Family education DME: W/C  Medical Summary Current Status: medically complicated by vertigo, low back pain, L ankle pain, LUE and LLE myoclonus, anxiety, insomnia, and spasticity  Barriers to Discharge: Behavior/Mood;Medical stability;Uncontrolled Pain;Self-care education;Spasticity   Possible Resolutions to [employer] Focus: Medication management for spasticity and myoclonus s/p xeomin injectiona nd adjusting medications, pain control with minimal tolerable dosing of narcotic medications, psychological support and medication  adjustment for anxiety/insomnia, bracing to prevent contractures   Continued Need for Acute Rehabilitation Level of Care: The patient requires daily medical management by a physician with specialized training in physical medicine and rehabilitation for the following reasons: Direction of a multidisciplinary physical rehabilitation program to maximize functional independence : Yes Medical management of patient stability for increased activity during participation in an intensive rehabilitation regime.: Yes Analysis of laboratory values and/or radiology reports with any subsequent need for medication adjustment and/or medical intervention. : Yes   I attest that I was present, lead the team conference, and concur with the assessment and plan of the team.   Gwenyth Allegra 09/14/2023, 7:58 AM

## 2023-09-13 NOTE — Progress Notes (Signed)
 Occupational Therapy Session Note  Patient Details  Name: Erik Santana. MRN: [id number] Date of Birth: 1990/09/08   Session 1 Today's Date: 09/13/2023 OT Individual Time: 5784-6962 OT Individual Time Calculation (min): 74 min    Session 2 Today's Date: 09/13/2023 OT Individual Time: 9528-4132 OT Individual Time Calculation (min): 60 min    Short Term Goals: Week 3:  OT Short Term Goal 1 (Week 3): Pt will don pants with CGA OT Short Term Goal 2 (Week 3): Pt will complete 3/3 toileting tasks with CGA OT Short Term Goal 3 (Week 3): Pt will transfer to the bsc/toilet with CGA OT Short Term Goal 4 (Week 3): Pt will complete self PROM of his LUE with (S)  Skilled Therapeutic Interventions/Progress Updates:    Session 1 Pt received supine with no c/o pain, agreeable to OT session.  He came to EOB with (S). Much improved knee and hip flexion following injections from MD. He completed a squat pivot to the L with close (S) to the w/c! Discussed using tub for shower today to practice most realistic home simulation, pt agreeable. Pt required min cueing for technique to stand pivot toward the L to the TTB in the tub- requiring CGA. Close guarding to transfer across seat. He stood with min A to doff pants. Discussed likely needing to sit at home for safety d/t no grab bars. He completed bathing seated with close (S). He transferred back to the w/c following with CGA. He donned a shirt with (S), pants with min A sit <> stand. He has made good progress with static standing with UE support. He requested to weigh himself and he was able to stand pivot onto a standing scale. Weight 173.4 lbs. He returned to the w/c and to his room. He was left sitting up with all needs met.   Session 2 Pt received sitting in the w/c with no c/o pain, agreeable to OT session. He was able to hemi propel wc to the therapy gym. He was set up to complete mirror therapy while OT retrieved new w/c to trial lighter weight chair  with lower back to simulate more custom chair that will likely be ordered for home. He transferred into new w/c with CGA. He really liked new chair and felt he had more accuracy and efficiency in propulsion. Pt completed the BUE ergometer to challenge LUE strength /AROM and endurance needed to complete ADLs and IADLs with the highest level of independence. Pt completed 7 min forward and 7 minute backward with cueing for pacing and technique, as well as hands on facilitation. He then propelled his w/c to outside the hospital to finish remainder of session in the warm sun to benefit mental health and psychosocial adjustment. He completed LUE NMR- working on grasp/release and elbow flex/ext. He had excellent motor control today- full volitional finger flex/ext on 3 occasions, usually when not fully concentrating on movement. He also demonstrated against gravity elbow flexion, 30 degrees and extension, about 10 degrees. He returned inside and to his room. He was left sitting up with all needs met.    Saebo Stim One was placed on his L tricep to promote muscle lengthening and tone management for maximal NMR. 60 min unattended e stim. No co pain or adverse skin reaction.  330 pulse width 35 Hz pulse rate On 8 sec/ off 8 sec Ramp up/ down 2 sec Symmetrical Biphasic wave form  Max intensity at 500 Ohm load   Therapy Documentation Precautions:  Precautions Precautions: Fall Precaution/Restrictions Comments: left LE tone Restrictions Weight Bearing Restrictions Per Provider Order: No  Therapy/Group: Individual Therapy  Crissie Reese 09/13/2023, 6:25 AM

## 2023-09-13 NOTE — Plan of Care (Signed)
 Goals upgraded to supervision overall d/t pt progress   Problem: RH Bathing Goal: LTG Patient will bathe all body parts with assist levels (OT) Description: LTG: Patient will bathe all body parts with assist levels (OT) Flowsheets (Taken 09/13/2023 0814) LTG: Pt will perform bathing with assistance level/cueing: (upgraded d/t pt progress- 3/18) Supervision/Verbal cueing   Problem: RH Dressing Goal: LTG Patient will perform lower body dressing w/assist (OT) Description: LTG: Patient will perform lower body dressing with assist, with/without cues in positioning using equipment (OT) Flowsheets (Taken 09/13/2023 0814) LTG: Pt will perform lower body dressing with assistance level of: (upgraded d/t pt progress- 3/18) Supervision/Verbal cueing   Problem: RH Toileting Goal: LTG Patient will perform toileting task (3/3 steps) with assistance level (OT) Description: LTG: Patient will perform toileting task (3/3 steps) with assistance level (OT)  Flowsheets (Taken 09/13/2023 0814) LTG: Pt will perform toileting task (3/3 steps) with assistance level: (upgraded d/t pt progress- 3/18) Supervision/Verbal cueing   Problem: RH Toilet Transfers Goal: LTG Patient will perform toilet transfers w/assist (OT) Description: LTG: Patient will perform toilet transfers with assist, with/without cues using equipment (OT) Flowsheets (Taken 09/13/2023 0814) LTG: Pt will perform toilet transfers with assistance level of: (upgraded d/t pt progress- 3/18) Supervision/Verbal cueing   Problem: RH Tub/Shower Transfers Goal: LTG Patient will perform tub/shower transfers w/assist (OT) Description: LTG: Patient will perform tub/shower transfers with assist, with/without cues using equipment (OT) Flowsheets (Taken 09/13/2023 0814) LTG: Pt will perform tub/shower stall transfers with assistance level of: (upgraded d/t pt progress- 3/18) Supervision/Verbal cueing   Problem: RH Memory Goal: LTG Patient will demonstrate ability  for day to day recall/carry over during activities of daily living with assistance level (OT) Description: LTG:  Patient will demonstrate ability for day to day recall/carry over during activities of daily living with assistance level (OT). Outcome: Completed/Met

## 2023-09-13 NOTE — Progress Notes (Signed)
 PROGRESS NOTE   Subjective/Complaints:  No acute complaints.  No events overnight.   Panic attack in therapy gym yesterday; therapies reporting LUE tone very related to anxiety and mood.  Patient states he is unsure what initiated it, felt clammy, was tachycardic, high blood pressure and short of breath for a few minutes.  Self resolved.  Patient also complaining of some fullness in his right ear, as well as pain in his left low back, which is chronic.  He does state that his right eardrum likely burst with some bleeding a few months ago, but he never had it examined.  ROS: Denies fevers, chills, N/V, abdominal pain, diarrhea, SOB, cough, chest pain, new weakness or paraesthesias.    + insomnia/anxiety-mostly improved, now with panic attack + LUE and LLE myoclonus-improving + Spasticity -ongoing/improving + Dizziness/vertigo-improving but persistent + Right ear fullness + Low back pain  Objective:   No results found. Recent Labs    09/12/23 0511  WBC 5.5  HGB 15.9  HCT 46.2  PLT 348      Recent Labs    09/12/23 0511  NA 137  K 3.8  CL 102  CO2 28  GLUCOSE 89  BUN 10  CREATININE 0.91  CALCIUM 9.1      Intake/Output Summary (Last 24 hours) at 09/13/2023 1029 Last data filed at 09/12/2023 1934 Gross per 24 hour  Intake 360 ml  Output 850 ml  Net -490 ml        Physical Exam: Vital Signs Blood pressure 116/71, pulse 61, temperature 97.8 F (36.6 C), resp. rate 18, height 6\' 2"  (1.88 m), weight 78.7 kg, SpO2 96%.  Constitutional: No apparent distress. Appropriate appearance for age sitting up in therapy gym. HENT: Atraumatic, normocephalic.  No erythema in the external auditory canal on the right or the tympanic membrane, no membrane bulging, does have some mild fluid behind the membrane.  Eyes: PERRLA. EOMI. no obvious nystagmus, is symptomatic with vertigo on superior gaze.  Ongoing Cardiovascular:  Regular rate, regular rhythm, no murmurs/rub/gallops. No Edema.  Respiratory: CTAB. No rales, rhonchi, or wheezing. On RA.  Abdomen: + bowel sounds, normoactive. No distention or tenderness.  Skin: C/D/I.   Psych: Appropriate mood and affect.  Less anxious than prior exams.   MSK:         Left lower extremity in AFO, can range ankle passively to neutral  Tenderness to palpation in the left lumbar paraspinals and flank; muscle tightness without apparent spasms or deformity.    Neurologic exam:  Cognition: AAO to person, place, time and event.  No apparent cognitive deficits. Insight: Good insight into current condition.  Mood: Anxious mood, elevated but appropriate Sensation: Equal and intact in BL UE and Les.  Reflexes: 2+ in R  UE and LE; no myoclonus in left lower extremity with passive dorsiflexion.. CN: 2-12 grossly intact.   Spasticity:  Left upper extremity: MAS 1 shoulder abductors, MAS 1+ elbow flexors, MAS 1 elbow extensors; MAS 1 wrist flexors; MAS 0 finger flexors , MAS 0 thumb adductors--much better able to relax fingers today  Left lower extremity: MAS 0 hip flexors, MAS 0 knee extensors, MAS 1 knee flexors, MAS  2-3 plantar flexors.       Strength:                LUE: 1/5 SA, 3/5 EF, 3/5 EE, tr/5 WE, 3-4/5 FF, 2/5 FA                  RUE:  5/5 SA, 5/5 EF, 5/5 EE, 5/5 WE, 5/5 FF, 5/5 FA                LLE: 4/5 HF, 2/5 KE, 0/5  DF, 0/5  EHL, 1/5  PF --unchanged                RLE:  5/5 HF, 5/5 KE, 5/5  DF, 5/5  EHL, 5/5  PF     Assessment/Plan: 1. Functional deficits which require 3+ hours per day of interdisciplinary therapy in a comprehensive inpatient rehab setting. Physiatrist is providing close team supervision and 24 hour management of active medical problems listed below. Physiatrist and rehab team continue to assess barriers to discharge/monitor patient progress toward functional and medical goals  Care Tool:  Bathing    Body parts bathed by patient: Left  arm, Chest, Abdomen, Front perineal area, Right upper leg, Left upper leg, Face, Right arm, Right lower leg, Left lower leg, Buttocks   Body parts bathed by helper: Buttocks, Right lower leg, Left lower leg     Bathing assist Assist Level: Contact Guard/Touching assist     Upper Body Dressing/Undressing Upper body dressing   What is the patient wearing?: Pull over shirt    Upper body assist Assist Level: Supervision/Verbal cueing    Lower Body Dressing/Undressing Lower body dressing      What is the patient wearing?: Pants     Lower body assist Assist for lower body dressing: Minimal Assistance - Patient > 75%     Toileting Toileting    Toileting assist Assist for toileting: Minimal Assistance - Patient > 75%     Transfers Chair/bed transfer  Transfers assist     Chair/bed transfer assist level: Minimal Assistance - Patient > 75%     Locomotion Ambulation   Ambulation assist      Assist level: Minimal Assistance - Patient > 75% Assistive device: Cane-quad Max distance: 20'   Walk 10 feet activity   Assist  Walk 10 feet activity did not occur: Safety/medical concerns (Fatigue)  Assist level: Minimal Assistance - Patient > 75% Assistive device: Parallel bars, Other (comment) (rail)   Walk 50 feet activity   Assist Walk 50 feet with 2 turns activity did not occur: Safety/medical concerns         Walk 150 feet activity   Assist Walk 150 feet activity did not occur: Safety/medical concerns         Walk 10 feet on uneven surface  activity   Assist Walk 10 feet on uneven surfaces activity did not occur: Safety/medical concerns         Wheelchair     Assist Is the patient using a wheelchair?: Yes Type of Wheelchair: Manual    Wheelchair assist level: Supervision/Verbal cueing Max wheelchair distance: 300'    Wheelchair 50 feet with 2 turns activity    Assist        Assist Level: Supervision/Verbal cueing    Wheelchair 150 feet activity     Assist      Assist Level: Supervision/Verbal cueing   Blood pressure 116/71, pulse 61, temperature 97.8 F (36.6 C), resp. rate 18, height 6'  2" (1.88 m), weight 78.7 kg, SpO2 96%.   Medical Problem List and Plan: 1. 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 Progressing well with therapies.  3-10: Looking into getting better PRAFO for positioning and to prevent contractures in left lower extremity.  Considering elbow bed sloe for positioning as well, although patient has full range of motion here and is agreeable to trying to stretch in bed. 3/11: Min A UB ADLs, Mod A LBD, is getting bicep movement and deltoid 1/5 now. With PT Min A transfers, Mod A ambulating 30 ft., progressing L leg independently. Limited by tone.   3.12: Custom AFO consult placed with [facility]  3-17: OT reaching out to orthotics about elbow brace; possible bedsloe versus custom  2.  Antithrombotics: -DVT/anticoagulation:  Pharmaceutical: Lovenox d/c'd 3/1             -antiplatelet therapy: N/A   3. Pain Management:  - 2/27: Tylenol 350 to 650 mg every 6 hours for mild to moderate pain, tramadol 50 mg every 6 hours as needed for severe pain.  Has minimal complaints of pain so far, would consider gabapentin if additional needed.  3/3: Using tramadol approximately BID for lower back pain; with benefit.  3-18: Aqua thermia alternating with lidocaine patch to left low back for chronic pain  4. Mood/Behavior/Sleep: Provide emotional support             -antipsychotic agents: N/A -2-27: Was using Xanax at home for sleep.  Has Atarax on board, add as needed melatonin and scheduled trazodone 100 mg nightly.  Will get sleep log.  -  2-28: Sleep improved with trazodone and as needed melatonin; continue.  -3/3: addition of BuSpar 5 mg 3 times daily for anxiety--improving 3/7: Increase BuSpar to 7.5 mg 3 times daily 3-10: Atarax DC'd due to making patient "jumpy" 3-11: Too early to [employer], patient continues to complain of significant anxiety and no sleep.  After considerable back-and-forth discussion, agreed to zolpidem 5 mg nightly as needed while inpatient.  Patient understands this will not be prescribed at discharge. 3-12: Sleep and anxiety improved despite not using zolpidem.  Will still have available as needed--used, with benefit  3-13: Anxiety significantly improved, suspect in part due to Depakote; continue current regimen 3-17: Sleeping well, on stable regimen.  LFTs okay.  Will transition Depakote to long-acting this week. 3-18: Panic attack in therapy gym yesterday; otherwise does not think mood is generally better controlled.  Added back as needed Atarax 25 mg for episodes.  Increase BuSpar to 10 mg 3 times daily. Switching from Depakote sprinkles to ER 500 mg daily in AM for myoclonus and mood   5. Neuropsych/cognition: This patient is capable of making decisions on his own behalf. - reporting severe ongoing anxiety with staff, recommending neuropsychiatry and behavioral assessment as outpatient  - 3-11: Remains in significant anxiety; of note, denies history of drug use outside of marijuana.  6. Skin/Wound Care: Routine skin checks   -DC peripheral IV  7. Fluids/Electrolytes/Nutrition: Routine in and outs with follow-up chemistries   -Potassium slightly  low 3.4, otherwise labs stable.  -3-3: AM labs stable  -09/03/23 pt requested vitamin D level for Mondays labs, ordered 3-10: Vitamin D low, start supplementation.  50,000 units once weekly for 4 doses.  8.  Tobacco use.  Continue [name]- consider discussion of decrease in dose given elevated blood pressure.  Provide counseling   9.  History  of polysubstance use.  Urine drug screen positive marijuana.  Patient does endorse using Ativan and Percocet off the streets.  Provide counseling -3-11: Patient states he in fact was not taking Ativan or Percocet off the streets, that he had been prescribed these earlier in life and was endorsing that when asked.  Wishes the record to reflect this answer.  He does not deny recreational marijuana use. - Will continue to avoid controlled substances much as possible, patient understands that zolpidem will not be prescribed at discharge   10.  Constipation/diarrhea.  Continue Senokot S1 tablet twice daily.   -Multiple liquid stools overnight, large; DC Senokot  Improved -3-6: Increase to Senokot S1 tab twice daily, MiraLAX daily.  Add Anusol as needed.  -09/06/23 LBM yesterday, monitor 3-13: Patient denies any further diarrhea, straining.  Continue current regimen. -09/11/23 LBM yesterday, cont regimen  11.  LLE >> LUE Spasticity/myoclonus.  -Ordered left WHO today, may need custom per OT -Increase baclofen from 10 mg twice daily to 10 mg 3 times daily; continue Zanaflex 2 mg every 8 hours as needed for spasms -Monitor with therapy; would consider addition of antiepileptic if significant myoclonus -Discussed with patient this a.m., may need Botox exam and injection while in inpatient rehab for left finger flexor tightness, will monitor 1 week on medication prior to pursuing -2-28: Tone improved with increased baclofen; per therapy reports, ongoing issues with LLE myoclonus, could benefit from Depakote for this and ongoing mood/behavior difficulties, but will hold off to see if any continued improvement over the weekend on current medications. 3/2: asked Trey Paula PT if longer foot rest can be applied to wheelchair as patient experiencing clonus with current and feels it is too small  3/3: Using tizanidine and baclofen with benefit 3-5: Myoclonus remains very limiting.  Will avoid Valium due to substance use  history; start Depakote 125 mg twice daily.  Will monitor LFTs in next few days. 3-6: Myoclonus improved!  Continue Depakote at current dose, check LFTs on 3/7.  Tone remains severe, will increase baclofen to 15 mg 3 times daily tizanidine has been sedating. 3/7: LFTs normal, myoclonus with effort today but much easier to break and no longer occurring at rest.  Left upper extremity getting some finger and wrist extension back!  Will increase tizanidine to 4 mg 3 times daily and look at adjusting Depakote next week. 3/10 : will look into getting Xeomen samples for injection tomorrow.  Patient agreeable. 3-11: Increase Depakote to 2025 mg every 8 hours.  Perform Xeomin injection as below after discussion of risks and benefits with patient, patient consent and timeout.  The below mentioned sites were cleaned and prepped with alcohol swabs, and muscles were identified by anatomical landmarks and confirmed with use of EMG and stim.  Patient tolerated procedure well without bleeding or side effect.   LLE:   50 units medial gastrocnemius   50 units lateral gastrocnemius   50 units soleus   50 units lateral hamstrings 3-12: Myoclonus and left ankle tone improved today with Depakote increase; doubtful Xeomin injections are kicking in yet.   3/13: LFTS normal; continue current dose  3-18: Switching from Depakote sprinkles to ER 500 mg daily in AM for myoclonus and mood   12. Bradycardia/Incomplete bundle branch block - episodes of high 40s/low 50s intermittent throughout hospitalization  - EKG today NSR 60 bpm, with QTC 440   - No obvious medication contributors - very rare s/e to tramadol  - If  symptomatic, could consider cardiology consult and holter monitor/ziopatch -HR reviewed and appears to have resolved, discussed that lower heart rate at night is physiological  - 3/2: cardiology consulted, discussed with patient that no changes recommended  - 3/4: No bradycardia or sleep apnea on overnight pulse  ox  14. Nasal congestion: asked nursing to advise patient not to use home nasal spray as it is a vasoconstrictor and can increase stroke risk  -Using saline nose spray only, can continue  15. Dizziness/vertigo--improved   - Scopalamine patch   - Meclizine 25 mg TID PRN   - 3/4: OT doing vestibular eval today--no peripheral vertigo  3/11: improving 3-13: Has not used as needed meclizine in some time; will DC scopolamine patch 3-17: Resume scopolamine patch per patient request--with improvement  16. Left ankle roll.  No apparent deformity, negative [place] ankle rules for imaging. -Discussed with PT using Aircast for positioning and support given history of frequent ankle rolling -Not bothersome to the patient at this time - 3/11: pain ongoing; xray ordered--patient refused 3/14: Pending custom AFO for ankle pain/support  17.  Right ear fullness.  No signs of otitis externa or media.  Start Claritin 10 mg daily and continue Flonase.    LOS: 20 days A FACE TO FACE EVALUATION WAS PERFORMED  Angelina Sheriff 09/13/2023, 10:29 AM

## 2023-09-14 MED ORDER — LIDOCAINE 5 % EX PTCH
1.0000 | MEDICATED_PATCH | Freq: Two times a day (BID) | CUTANEOUS | Status: DC | PRN
  Administered 2023-09-14 – 2023-09-21 (×7): 1 via TRANSDERMAL
  Filled 2023-09-14 (×7): qty 1

## 2023-09-14 NOTE — Progress Notes (Signed)
 PROGRESS NOTE   Subjective/Complaints:  Patient used Atarax overnight, using tramadol and zolpidem consistently as well.  States he used the Atarax overnight when he started to feel worked up, did feel that that it was helpful. Vital stable. Complaining of 7 out of 10 back pain overnight.  He states the lidocaine patches work well, but are peeling off and has difficulty keeping them on for 24 hours.  Heat pack helping as well.  Feels that Claritin and Flonase has significantly helped his congestion and somewhat his ear fullness.   ROS: Denies fevers, chills, N/V, abdominal pain, diarrhea, SOB, cough, chest pain, new weakness or paraesthesias.    + insomnia/anxiety-ongoing + LUE and LLE myoclonus-improving + Spasticity -ongoing/improving + Dizziness/vertigo-improving but persistent + Right ear fullness--improved + Low back pain--improved  Objective:   No results found. Recent Labs    09/12/23 0511  WBC 5.5  HGB 15.9  HCT 46.2  PLT 348      Recent Labs    09/12/23 0511  NA 137  K 3.8  CL 102  CO2 28  GLUCOSE 89  BUN 10  CREATININE 0.91  CALCIUM 9.1      Intake/Output Summary (Last 24 hours) at 09/14/2023 0806 Last data filed at 09/14/2023 0426 Gross per 24 hour  Intake --  Output 1300 ml  Net -1300 ml        Physical Exam: Vital Signs Blood pressure 120/81, pulse 66, temperature 98 F (36.7 C), resp. rate 18, height 6\' 2"  (1.88 m), weight 78.7 kg, SpO2 96%.  Constitutional: No apparent distress.  Sitting up in therapy gym.  Appropriate appearance. HENT: Atraumatic, normocephalic.   Eyes: PERRLA. EOMI. no obvious nystagmus. Cardiovascular: Regular rate, regular rhythm, no murmurs/rub/gallops. No Edema.  Respiratory: CTAB. No rales, rhonchi, or wheezing. On RA.  Abdomen: + bowel sounds, normoactive. No distention or tenderness.  Skin: C/D/I.   Psych: Appropriate mood and affect.  No apparent  anxiety.   MSK:            Left lower extremity in AFO  Lidoderm patch on left flank peeling off; less TTP    Neurologic exam:  Cognition: AAO to person, place, time and event.  No apparent cognitive deficits. Insight: Good insight into current condition.  Mood: Anxious mood, elevated but appropriate Sensation: Equal and intact in BL UE and Les.  Reflexes: 2+ in R  UE and LE; no myoclonus in left lower extremity with passive dorsiflexion.. CN: 2-12 grossly intact.   Spasticity:  Left upper extremity: MAS 1 shoulder abductors, MAS 1+ elbow flexors, MAS 1 elbow extensors; MAS 1 wrist flexors; MAS 0 finger flexors , MAS 0 thumb adductors--much better able to relax fingers today  Left lower extremity: MAS 0 hip flexors, MAS 0 knee extensors, MAS 1 knee flexors, MAS 2-3 plantar flexors.       Strength:                LUE: 1/5 SA, 3/5 EF, 3/5 EE, tr/5 WE, 3-4/5 FF, 2/5 FA                  RUE:  5/5 SA, 5/5 EF, 5/5 EE, 5/5  WE, 5/5 FF, 5/5 FA                LLE: 4/5 HF, 2/5 KE, 0/5  DF, 0/5  EHL, 1/5  PF --unchanged                RLE:  5/5 HF, 5/5 KE, 5/5  DF, 5/5  EHL, 5/5  PF    Unchanged neurologic exam 3-19  Assessment/Plan: 1. Functional deficits which require 3+ hours per day of interdisciplinary therapy in a comprehensive inpatient rehab setting. Physiatrist is providing close team supervision and 24 hour management of active medical problems listed below. Physiatrist and rehab team continue to assess barriers to discharge/monitor patient progress toward functional and medical goals  Care Tool:  Bathing    Body parts bathed by patient: Left arm, Chest, Abdomen, Front perineal area, Right upper leg, Left upper leg, Face, Right arm, Right lower leg, Left lower leg, Buttocks   Body parts bathed by helper: Buttocks, Right lower leg, Left lower leg     Bathing assist Assist Level: Contact Guard/Touching assist     Upper Body Dressing/Undressing Upper body dressing   What is  the patient wearing?: Pull over shirt    Upper body assist Assist Level: Supervision/Verbal cueing    Lower Body Dressing/Undressing Lower body dressing      What is the patient wearing?: Pants     Lower body assist Assist for lower body dressing: Minimal Assistance - Patient > 75%     Toileting Toileting    Toileting assist Assist for toileting: Minimal Assistance - Patient > 75%     Transfers Chair/bed transfer  Transfers assist     Chair/bed transfer assist level: Minimal Assistance - Patient > 75%     Locomotion Ambulation   Ambulation assist      Assist level: Minimal Assistance - Patient > 75% Assistive device: Cane-quad Max distance: 20'   Walk 10 feet activity   Assist  Walk 10 feet activity did not occur: Safety/medical concerns (Fatigue)  Assist level: Minimal Assistance - Patient > 75% Assistive device: Parallel bars, Other (comment) (rail)   Walk 50 feet activity   Assist Walk 50 feet with 2 turns activity did not occur: Safety/medical concerns         Walk 150 feet activity   Assist Walk 150 feet activity did not occur: Safety/medical concerns         Walk 10 feet on uneven surface  activity   Assist Walk 10 feet on uneven surfaces activity did not occur: Safety/medical concerns         Wheelchair     Assist Is the patient using a wheelchair?: Yes Type of Wheelchair: Manual    Wheelchair assist level: Supervision/Verbal cueing Max wheelchair distance: 300'    Wheelchair 50 feet with 2 turns activity    Assist        Assist Level: Supervision/Verbal cueing   Wheelchair 150 feet activity     Assist      Assist Level: Supervision/Verbal cueing   Blood pressure 120/81, pulse 66, temperature 98 F (36.7 C), resp. rate 18, height 6\' 2"  (1.88 m), weight 78.7 kg, SpO2 96%.   Medical Problem List and Plan: 1. 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 Progressing well with therapies.  3-10: Looking into getting better PRAFO for positioning and to prevent contractures in left lower extremity.  Considering elbow bed sloe for positioning as well, although patient has full range of motion here and is agreeable to trying to stretch in bed. 3/11: Min A UB ADLs, Mod A LBD, is getting bicep movement and deltoid 1/5 now. With PT Min A transfers, Mod A ambulating 30 ft., progressing L leg independently. Limited by tone.   3.12: Custom AFO consult placed with [employer]  3-17: OT reaching out to orthotics about elbow brace; possible bedsloe versus custom  2.  Antithrombotics: -DVT/anticoagulation:  Pharmaceutical: Lovenox d/c'd 3/1             -antiplatelet therapy: N/A   3. Pain Management:  - 2/27: Tylenol 350 to 650 mg every 6 hours for mild to moderate pain, tramadol 50 mg every 6 hours as needed for severe pain.  Has minimal complaints of pain so far, would consider gabapentin if additional needed.  3/3: Using tramadol approximately BID for lower back pain; with benefit.  3-18: Aqua thermia alternating with lidocaine patch to left low back for chronic pain  3-19: Increase Lidoderm patch to every 12 hours as needed  4. Mood/Behavior/Sleep: Provide emotional support             -antipsychotic agents: N/A -2-27: Was using Xanax at home for sleep.  Has Atarax on board, add as needed melatonin and scheduled trazodone 100 mg nightly.  Will get sleep log.  - 2-28: Sleep improved with trazodone and as needed melatonin; continue.  -3/3: addition of BuSpar 5 mg 3 times daily for anxiety--improving 3/7: Increase BuSpar to 7.5 mg 3 times daily 3-10: Atarax DC'd due to making patient "jumpy" 3-11: Too early to [employer], patient continues  to complain of significant anxiety and no sleep.  After considerable back-and-forth discussion, agreed to zolpidem 5 mg nightly as needed while inpatient.  Patient understands this will not be prescribed at discharge. 3-12: Sleep and anxiety improved despite not using zolpidem.  Will still have available as needed--used, with benefit  3-13: Anxiety significantly improved, suspect in part due to Depakote; continue current regimen 3-17: Sleeping well, on stable regimen.  LFTs okay.  Will transition Depakote to long-acting this week. 3-18: Panic attack in therapy gym yesterday; otherwise does not think mood is generally better controlled.  Added back as needed Atarax 25 mg for episodes.  Increase BuSpar to 10 mg 3 times daily. Switching from Depakote sprinkles to ER 500 mg daily in AM for myoclonus and mood 3-19: Using Atarax, with benefit.  Monitor with switch from Depakote and increase BuSpar   5. Neuropsych/cognition: This patient is capable of making decisions on his own behalf. - reporting severe ongoing anxiety with staff, recommending neuropsychiatry and behavioral assessment as outpatient  - 3-11: Remains in significant anxiety; of note, denies history of drug use outside of marijuana.  6. Skin/Wound Care: Routine skin checks   -DC peripheral IV  7. Fluids/Electrolytes/Nutrition: Routine in and outs with follow-up chemistries   -Potassium slightly low 3.4, otherwise labs stable.  -3-3: AM labs stable  -09/03/23 pt requested vitamin D level for Mondays labs, ordered 3-10: Vitamin D low, start supplementation.  50,000 units once weekly for 4 doses.  8.  Tobacco use.  Continue [name]- consider discussion of decrease in dose given elevated blood pressure.  Provide counseling   9.  History of polysubstance use.  Urine drug screen positive marijuana.  Patient does endorse using Ativan and Percocet off the streets.  Provide counseling -3-11: Patient states he in fact was not taking Ativan  or Percocet off the streets, that he had been prescribed these earlier in life and was endorsing that when asked.  Wishes the record to reflect this answer.  He does not deny recreational marijuana use. - Will continue to avoid controlled substances much as possible, patient understands that zolpidem will not be prescribed at discharge   10.  Constipation/diarrhea.  Continue Senokot S1 tablet twice daily.   -Multiple liquid stools overnight, large; DC Senokot  Improved -3-6: Increase to Senokot S1 tab twice daily, MiraLAX daily.  Add Anusol as needed.  -09/06/23 LBM yesterday, monitor 3-13: Patient denies any further diarrhea, straining.  Continue current regimen. -3/18 LBM, large  11.  LLE >> LUE Spasticity/myoclonus.  -Ordered left WHO today, may need custom per OT -Increase baclofen from 10 mg twice daily to 10 mg 3 times daily; continue Zanaflex 2 mg every 8 hours as needed for spasms -Monitor with therapy; would consider addition of antiepileptic if significant myoclonus -Discussed with patient this a.m., may need Botox exam and injection while in inpatient rehab for left finger flexor tightness, will monitor 1 week on medication prior to pursuing -2-28: Tone improved with increased baclofen; per therapy reports, ongoing issues with LLE myoclonus, could benefit from Depakote for this and ongoing mood/behavior difficulties, but will hold off to see if any continued improvement over the weekend on current medications. 3/2: asked Trey Paula PT if longer foot rest can be applied to wheelchair as patient experiencing clonus with current and feels it is too small  3/3: Using tizanidine and baclofen with benefit 3-5: Myoclonus remains very limiting.  Will avoid Valium due to substance use history; start Depakote 125 mg twice daily.  Will monitor LFTs in next few days. 3-6: Myoclonus improved!  Continue Depakote at current dose, check LFTs on 3/7.  Tone remains severe, will increase baclofen to 15 mg 3  times daily tizanidine has been sedating. 3/7: LFTs normal, myoclonus with effort today but much easier to break and no longer occurring at rest.  Left upper extremity getting some finger and wrist extension back!  Will increase tizanidine to 4 mg 3 times daily and look at adjusting Depakote next week. 3/10 : will look into getting Xeomen samples for injection tomorrow.  Patient agreeable. 3-11: Increase Depakote to 2025 mg every 8 hours.  Perform Xeomin injection as below after discussion of risks and benefits with patient, patient consent and timeout.  The below mentioned sites were cleaned and prepped with alcohol swabs, and muscles were identified by anatomical landmarks and confirmed with use of EMG and stim.  Patient tolerated procedure well without bleeding or side effect.   LLE:   50 units medial gastrocnemius   50 units lateral gastrocnemius   50 units soleus   50 units lateral hamstrings 3-12: Myoclonus and left ankle tone improved today with Depakote increase; doubtful Xeomin injections are kicking in yet.   3/13: LFTS normal; continue current dose  3-18: Switching from Depakote sprinkles to ER 500 mg daily in AM for myoclonus and mood   12. Bradycardia/Incomplete bundle branch block - episodes of high 40s/low 50s intermittent throughout hospitalization  - EKG today NSR  60 bpm, with QTC 440   - No obvious medication contributors - very rare s/e to tramadol  - If  symptomatic, could consider cardiology consult and holter monitor/ziopatch -HR reviewed and appears to have resolved, discussed that lower heart rate at night is physiological  - 3/2: cardiology consulted, discussed with patient that no changes recommended  - 3/4: No bradycardia or sleep apnea on overnight pulse ox  14. Nasal congestion: asked nursing to advise patient not to use home nasal spray as it is a vasoconstrictor and can increase stroke risk  -Using saline nose spray only, can continue  15.  Dizziness/vertigo--improved   - Scopalamine patch   - Meclizine 25 mg TID PRN   - 3/4: OT doing vestibular eval today--no peripheral vertigo  3/11: improving 3-13: Has not used as needed meclizine in some time; will DC scopolamine patch 3-17: Resume scopolamine patch per patient request--with improvement  16. Left ankle roll.  No apparent deformity, negative [place] ankle rules for imaging. -Discussed with PT using Aircast for positioning and support given history of frequent ankle rolling -Not bothersome to the patient at this time - 3/11: pain ongoing; xray ordered--patient refused 3/14: Pending custom AFO for ankle pain/support  17.  Right ear fullness.  No signs of otitis externa or media.  Start Claritin 10 mg daily and continue Flonase.  -3-19: Symptoms improved with above measures   LOS: 21 days A FACE TO FACE EVALUATION WAS PERFORMED  Angelina Sheriff 09/14/2023, 8:06 AM

## 2023-09-14 NOTE — Progress Notes (Addendum)
 Patient ID: Erik Santana., male   DOB: October 05, 1990, 33 y.o.   MRN: [id number]  SW made efforts to meet with pt, but pt out with PT. SW will follow-up to provide updates.   1455- SW left message  for pt mother requesting return phone call to pt mother Erik Santana to discuss updates from team and family edu.   *SW received return phone call. SW provided updates from team conference. SW reviewed discharge. She reports she will be with patient, however, she still works. Will discuss with he dtr about coming in for family edu, and support (dtr currently has car issues). Fam edu on Friday 1pm-3pm. SW shared will confirm final discharge recommendations.  *SW received call to discuss change in fam edu. Now Friday 10am-12pmCecile Sheerer, MSW, LCSW Office: [phone] Cell: [phone] Fax: [phone]

## 2023-09-14 NOTE — Plan of Care (Signed)
  Problem: RH BOWEL ELIMINATION Goal: RH STG MANAGE BOWEL WITH ASSISTANCE Description: STG Manage Bowel with mod I Assistance. Outcome: Progressing   Problem: RH SAFETY Goal: RH STG ADHERE TO SAFETY PRECAUTIONS W/ASSISTANCE/DEVICE Description: STG Adhere to Safety Precautions With cues Assistance/Device. Outcome: Progressing   Problem: RH PAIN MANAGEMENT Goal: RH STG PAIN MANAGED AT OR BELOW PT'S PAIN GOAL Description: < 4 with prns Outcome: Progressing   Problem: RH KNOWLEDGE DEFICIT Goal: RH STG INCREASE KNOWLEDGE OF HYPERTENSION Description: Patient and family will be able to manage HTN with medications and educational resources for dietary modification, lifestyle modification independently. Outcome: Progressing

## 2023-09-14 NOTE — Progress Notes (Signed)
 Occupational Therapy Session Note  Patient Details  Name: Erik Santana. MRN: [id number] Date of Birth: September 27, 1990  Today's Date: 09/14/2023 OT Individual Time: 2595-6387 OT Individual Time Calculation (min): 69 min    Short Term Goals: Week 3:  OT Short Term Goal 1 (Week 3): Pt will don pants with CGA OT Short Term Goal 2 (Week 3): Pt will complete 3/3 toileting tasks with CGA OT Short Term Goal 3 (Week 3): Pt will transfer to the bsc/toilet with CGA OT Short Term Goal 4 (Week 3): Pt will complete self PROM of his LUE with (S)  Skilled Therapeutic Interventions/Progress Updates:    Pt received sitting up with no c/o pain, agreeable to OT session. Session focused on dynamic balance progression and righting reactions at the hip. He hemi propelled the wc to the therapy gym with mod I. He completed 25 ft x3 trials with trial of a loft strand crutch on the R side instead of large base quad cane. He required mod cueing for reducing LLE internal rotation and circumduction at the hip. He required only min A - CGA for balance. Switched out AFO to medial support to assess difference- pt with much better knee and ankle control with lateral support. He was then placed in maxi sky walking sling to allow higher level balance perturbations to be challenged for righting reaction training and overall fall risk reduction. He completed various stepping tasks in/out of narrow based and dynamic movements to provide large perturbations to balance. He was able to activate hip righting reactions with large step back, as well as quick, dynamic loft strand crutch placement to avoid full LOB. He did seem to be getting anxious as activity continued so several rest breaks were provided. He ended session by going outside to re-set mentally and reflect on activity. He returned to his room following and was left sitting up with all needs met.  Therapy Documentation Precautions:  Precautions Precautions:  Fall Precaution/Restrictions Comments: left LE tone Restrictions Weight Bearing Restrictions Per Provider Order: No  Therapy/Group: Individual Therapy  Crissie Reese 09/14/2023, 6:25 AM

## 2023-09-14 NOTE — Progress Notes (Signed)
 Orthopedic Tech Progress Note Patient Details:  Erik Santana [birth year] [id number]  Called in order to [facility] for an ELBOW [place] BRACE   Patient ID: Erik Cloninger., male   DOB: 1991-02-25, 33 y.o.   MRN: [id number]  Erik Santana 09/14/2023, 3:27 PM

## 2023-09-14 NOTE — Progress Notes (Signed)
 Physical Therapy Session Note  Patient Details  Name: Erik Santana. MRN: [id number] Date of Birth: 1991/02/03  Today's Date: 09/14/2023 PT Individual Time: 0803-0859 PT Individual Time Calculation (min): 56 min    Today's Date: 09/14/2023 PT Individual Time: 1418-1530 PT Individual Time Calculation (min): 72 min   Short Term Goals: Week 3:  PT Short Term Goal 1 (Week 3): STGs = LTGs  Skilled Therapeutic Interventions/Progress Updates:     1st Session: Pt received supine in bed and agrees to therapy. Supine to sit with cues for positioning. Pt threads pants over legs with minA and pulls over hips with cues for body mechanics. Stand pivot transfer to [facility] with cues for positioning and safety. Pt self propels WC to gym with Rt hemibody  for endurance and functional mobility training. Pt transfers to Nustep with cues for sequencing. Pt completes Nustep for Lt hemibody NMR and reciprocal coordination. Pt completes x15:00 with only legs and stabilization bar utilized to maintain LLE in neutral hip rotation. PT provides cues for hand and foot placement and completing full available ROM, without minimization of compensatory movements. Pt performs squat pivot from Nustep to [facility] and propels WC x150' to main gym. Pt performs squat pivot to mat table with same cues. Pt transitions to Rt sidelying with minA to promot desired position and prevent posterior rotation of Lt upper trunk. Pt's LLE positioned on powder board to allow ROM in gravity minimized position. PT provides AAROM and tactile cueing at quads and hamstring, as well as stabilization of thigh to promote knee extension and flexion without auxilary movement at hips. Pt noted to have full AROM of Lt knee extension in sidelying, and able to initiate knee flexion but requiring AAROM to move through full ROM. WC transport back to room. Left seated with alarm intact and all needs within reach.   2nd Session: Pt received seated in [facility] and agrees to  therapy. No complaint of pain. Pt is able to maneuver WC into restroom with cues for safety and positioning. Pt performs stand step transfer onto shower chair with CGA and cues for sequencing, positioning, and safety. Pt performs shower in seated on shower chair, with PT providing cues for posture, positioning, and safety. Pt requires assistance to thread upper and lower body clothing over Lt hemibody, and dons clothes with cues for safety. PT assists to don shoes and Lt AFO prior to additional mobility. Pt self propels WC x150' to gym with Rt hemibody and cues for body mechanics. Pt performs sit to stand in litegait and PT assists to don harness, with cues for foot placement and neutral posture. Pt performs 2x10 RLE lifts with verbal and tactile cueing at Lt hip and knee for improved contraction of equad and glutes, as well as mirror provided for visual feedback. Pt then performs 2x10 stand to tall kneeling transitions with Rt knee. PT provides modA to guide pt through desired motor pattern. Pt takes seated rest break. Pt then ambulates x80' with minA/modA at Lt trunk and cues to "kick" LLE for progression during swing phase to avoid circumduction or vaulting. WC transport back to room. Left seated with alarm intact and all needs within reach.   Therapy Documentation Precautions:  Precautions Precautions: Fall Precaution/Restrictions Comments: left LE tone Restrictions Weight Bearing Restrictions Per Provider Order: No    Therapy/Group: Individual Therapy  [name], PT, DPT 09/14/2023, 3:56 PM

## 2023-09-15 ENCOUNTER — Inpatient Hospital Stay (HOSPITAL_COMMUNITY)

## 2023-09-15 NOTE — Progress Notes (Signed)
 Occupational Therapy Session Note  Patient Details  Name: Erik Santana. MRN: [id number] Date of Birth: 08/02/1990  Today's Date: 09/15/2023 OT Individual Time: 0830-0930 OT Individual Time Calculation (min): 60 min    Short Term Goals: Week 3:  OT Short Term Goal 1 (Week 3): Pt will don pants with CGA OT Short Term Goal 2 (Week 3): Pt will complete 3/3 toileting tasks with CGA OT Short Term Goal 3 (Week 3): Pt will transfer to the bsc/toilet with CGA OT Short Term Goal 4 (Week 3): Pt will complete self PROM of his LUE with (S)  Skilled Therapeutic Interventions/Progress Updates:    1:1 Pt received in the bed. Pt able to come to EOB with supervision with extra time and cues for bring left Le to EOB.  Sat EOB with focus on dressing with focus hemi dressing techniques in unsupported position. Pt does endorse hip discomfort today with trying to thread pants and sock on in figure 4 position today. Pt with good recall of hemi dressing techniques - especially for UE. Pt with increased ability to release fisted grip and move into extension with less time and able to maintain open hand with activity. Pt still requires assistance to don AFO/shoe on left LE. PT able to demonstrate safe squat pivot transfer into w/c to the left with supervision. Pt can demonstrate safe management of w/c with donning brakes and removing leg rests.  Pt able to propel w/c to the dayroom.  Pt transferred to the mat and into prone on the mat with left Ue into external rotation and with shoulder flexion to rest up under his head. Transitioned into sidelying on his right side -during focus on performing PNF with left UE pt was able to maintain left Le in hip and knee flexion in sidelying. With wrist in slight extension pt able achieve decr tone at the elbow. Pt able to get full extension of elbow with full AAROM at the shoulder (full shoulder flexion to end range with slight shoulder extension (past neutral) in sidelying. Also  activation noted in scapular retraction/protraction.   Transitioned into standing at the mat with focus on hip flexion and knee flexion bringing left Le up on the mat and then back down with decreasing hip circumduction as a way to compensate for decr activation in standing. Then in tall kneeling focus on advancing right knee forward and backward while maintaining trunk posture and activation on the left. Pt returned to the room and left sitting up in the w/c.  2nd session 14:15-15:00 1:1 Pt received in the w/c. Pt self propelled down to the gym mod I with extra time. Focus on dynamic balance and functional ambulation in all directions (laterally backwards, forward etc ) with and with out UE support. Also focus on hip/knee flexion with stepping with his left Le to decr circumduction of LE in walking - to put it together from work in earlier session. Pt's balance reactions continue to improve and with right UE support pt can maintain static balance mod I.Pt then ambulated with loft crutch from RN station all the way back to his room and into the bathroom ~ 110 feet with min A with cues for stepping.  Pt also received Blesoe brace for left Ue- education on donning it and discussed options for wearing schedule. Discussion about wearing it tonight and checking skin every 2 hrs. (Discussed with primary therapist about setting wearing schedule tomorrow with pt and pt's family for fam edu). Locked brace out in  extension and with holding/ tactle cues at  fingers pt able to ambulate with Ue at his side. Pt able to urinate in standing with extra time and contact guard to supervision.  Decr clonus noted in this session. Pt left with transport services for CT.  3rd session 15:35-16:07 1:1 self care retraining at shower level. Focus on pt being able to manage w/c parts and safe transfer in and out of shower and ability to manage clothing on and off. Pt continues to require assistance with donning and doffing brace.  Assistance provided to help guide left Ue to wash right UE. Pt able to perform the rest of bathing with lateral leans. Pt able to don clothing with extra time. Pt did stand to pull underwear and pants up without brace with does demonstrate flexor withdrawal and foot supination. Discussion about donning brace at home before standing for safety since it does require facilitation to maintain proper position for safety and integrity of joint.  Also discussed goal for outing tomorrow and transitioning into going back into the community with family and friends. Also discussed continued therapies and life post stroke with maintaining healthy lifestyle. Pt appreciative of conversations.   Therapy Documentation Precautions:  Precautions Precautions: Fall Precaution/Restrictions Comments: left LE tone Restrictions Weight Bearing Restrictions Per Provider Order: No General:   Vital Signs: Therapy Vitals Temp: 98 F (36.7 C) Pulse Rate: 98 Resp: 18 BP: (!) 124/99 Patient Position (if appropriate): Lying Oxygen Therapy SpO2: 95 % O2 Device: Room Air Pain:  No reports of pain in 1st session   No reports of pain in 2nd session No reports of pain in 3rd session    Therapy/Group: Individual Therapy  Roney Mans [facility] 09/15/2023, 3:12 PM

## 2023-09-15 NOTE — Progress Notes (Signed)
 Physical Therapy Session Note  Patient Details  Name: Erik Santana. MRN: [id number] Date of Birth: 03-01-1991  Today's Date: 09/15/2023 PT Individual Time: 7829-5621 PT Individual Time Calculation (min): 71 min   Today's Date: 09/15/2023 PT Individual Time: 1304-1330 PT Individual Time Calculation (min): 26 min   Short Term Goals: Week 3:  PT Short Term Goal 1 (Week 3): STGs = LTGs  Skilled Therapeutic Interventions/Progress Updates:     1st Session: PT received seated in [facility] and agrees to therapy. No complaint of pain. Pt self propels WC x450' to gym with RUE and RLE to providing endurance and functional mobility training. Pt maneuvers WC into parallel bars. Pt then completes standing activities in parallel bars for Lt hemibody NMR to promote improved motor patterns and balance. Pt stands and faces mirror for visual feedback, with BUE support on parallel bar. PT places cones along Lt lateral side of Lt foot and one cone 1' directly in front of Lt foot. Pt tasked with kicking over cone anterior to foot without abduction or circumducting leg, which would knock over cones to Lt of foot. PT provides feedback on performance, as well as cues for optimal body mechanics and sequencing of movement. Pt takes seated rest break. Pt performs activity multiple bouts with rest breaks between each bout. PT and pt have discussion regarding coping mechanisms and strategies for managing anxiety and frustration during rehab stay and following discharge. WC transport to day room. Pt ambulates x100; with lofstrand crutch in RUE, with PT providing CGA/minA throughout, primarily to facilitate Lt lateral weight shifting, with cues for optimal mechanics and motor patterns. Pt left seated with all needs within reach.   2nd Session: Pt received seated in [facility] and agrees to therapy. Pt has LUE bledsoe brace in place to facilitate elbow extension positioning. No complaint of pain. Pt self propels WC x200' to gym with RUE and  RLE, with cues for navigation. Performed for endurance and functional mobility training. PT enlists assistance of OT to adjust brace for improved fit. Pt then self propels WC x150' to dayroom. Pt completes Wii bowling activity for balance training and activity to promote Lt lateral weight shifting. PT provides minA in standing to promote weight shifting to the Lt during bowling motion. Pt able to remain standing for entire game without requiring seated rest break. WC transport back to room. Pt left seated with alarm intact and all needs within reach.   Therapy Documentation Precautions:  Precautions Precautions: Fall Precaution/Restrictions Comments: left LE tone Restrictions Weight Bearing Restrictions Per Provider Order: No  Therapy/Group: Individual Therapy  [name], PT, DPT 09/15/2023, 3:50 PM

## 2023-09-15 NOTE — Progress Notes (Addendum)
 Patient ID: Erik Santana., male   DOB: 11-29-1990, 33 y.o.   MRN: [id number]  1135am- SW called pt mother to discuss changing family edu due or outing tomorrow 10am-12pm. Fam edu tomorrow 8am-10am.  *SW went by pt room tp provide updates from team conference.   Cecile Sheerer, MSW, LCSW Office: [phone] Cell: [phone] Fax: [phone]
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
## 2023-09-16 NOTE — Progress Notes (Signed)
 Occupational Therapy Weekly Progress Note  Patient Details  Name: Erik Santana. MRN: [id number] Date of Birth: 1991-01-09  Beginning of progress report period: September 09, 2023 End of progress report period: September 16, 2023  Today's Date: 09/16/2023 OT Individual Time: 7829-5621 OT Individual Time Calculation (min): 60 min    Patient has met 4 of 4 short term goals.  [name] continues to make excellent progress toward his OT goals. He is completing UB ADLs with (S) for bathing and dressing. LB with CGA. He is able to carryover both compensatory and adaptive strategies to maximize his independence. His LUE volitional movement is heavily tied to his anxiety but with proper relaxation strategies he is able to volitionally demonstrate composite hand flexion and extension, as well as against gravity elbow flexion, about 30 degrees, and extension 15 degrees. He is highly motivated and working very hard in therapy.   Patient continues to demonstrate the following deficits: muscle weakness and muscle joint tightness, impaired timing and sequencing, abnormal tone, unbalanced muscle activation, and decreased coordination, and decreased standing balance, decreased postural control, hemiplegia, and decreased balance strategies and therefore will continue to benefit from skilled OT intervention to enhance overall performance with BADL and iADL.  Patient progressing toward long term goals..  Continue plan of care.  OT Short Term Goals Week 3:  OT Short Term Goal 1 (Week 3): Pt will don pants with CGA OT Short Term Goal 1 - Progress (Week 3): Met OT Short Term Goal 2 (Week 3): Pt will complete 3/3 toileting tasks with CGA OT Short Term Goal 2 - Progress (Week 3): Met OT Short Term Goal 3 (Week 3): Pt will transfer to the bsc/toilet with CGA OT Short Term Goal 3 - Progress (Week 3): Met OT Short Term Goal 4 (Week 3): Pt will complete self PROM of his LUE with (S) OT Short Term Goal 4 - Progress (Week  3): Met Week 4:  OT Short Term Goal 1 (Week 4): STG= LTG d/t ELOS  Skilled Therapeutic Interventions/Progress Updates:    Family education session completed with pt and his mother. Verbal education provided re fall risk reduction, energy conservation strategies, home carryover of transfer training, ADLs, and IADLs. Demonstration and hands on training completed for pt performance of UB/LB bathing and dressing at (S)- CGA level, toileting hygiene and transfers, and shower transfers. Pt completed full shower this session with mom assisting. Cueing for safety and caregiver safety provided. Transfers at [place] level, mom able to guard pt. Pt with excellent carryover of ADL compensatory techniques. Demonstrated PROM of the LUE and provided stroke NMR education. Also provided information on Saebo use at home. Signed off mom for transfers in room using loft strand crutch. Pt left sitting in the wc with all needs met.    Therapy Documentation Precautions:  Precautions Precautions: Fall Precaution/Restrictions Comments: left LE tone Restrictions Weight Bearing Restrictions Per Provider Order: No  Therapy/Group: Individual Therapy  Crissie Reese 09/16/2023, 6:27 AM
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
## 2023-09-16 NOTE — Progress Notes (Signed)
 Physical Therapy Session Note  Patient Details  Name: Erik Santana. MRN: [id number] Date of Birth: Dec 03, 1990  Today's Date: 09/16/2023 PT Individual Time: 0903-1000 PT Individual Time Calculation (min): 57 min   Short Term Goals: Week 3:  PT Short Term Goal 1 (Week 3): STGs = LTGs  Skilled Therapeutic Interventions/Progress Updates:     Pt received seated in WC and agrees to therapy. No complaint of pain. Pt's mother present for family education. PT provides update on pt's mobility and progress at CIR, as well as recommendations for safe mobility following discharge. WC transport to gym for time management. Pt completes car transfer with CGA/minA, with cues for safe sequencing and positioning. Pt self propels WC x200' with RUE and RLE for endurance training and functional mobility training. Pt completes x4 6" with minA at trunk, with cues for safe step sequencing, weight shifting, and neutral posture and body mechanics. Pt utilizes Rt lofstrand crutch for stairs. Following seated rest break, pt completes x8" step with similar assistance and cues, to mimic home stair setup. Pt's mother then assists with 8" step, with PT providing cues for safe guarding of pt and safe body mechanics of caregiver. Pt takes seated rest break. Pt then ambulates x100' with Rt lofstrand crutch and minA at trunk to facilitate Lt lateral weight shifting, with cues for reciprocal gait pattern and neutral rotation of Lt hip, with feedback on compensatory movements and strategies to reduce compensations. WC transport back to room. Left seated with alarm intact and all needs within reach.   Therapy Documentation Precautions:  Precautions Precautions: Fall Precaution/Restrictions Comments: left LE tone Restrictions Weight Bearing Restrictions Per Provider Order: No    Therapy/Group: Individual Therapy  [name], PT, DPT 09/16/2023, 3:28 PM

## 2023-09-16 NOTE — Progress Notes (Signed)
 Occupational Therapy Session Note  Patient Details  Name: Erik Santana. MRN: [id number] Date of Birth: [birth year]  Today's Date: 09/16/2023 OT Individual Time: 1030-1205 OT Individual Time Calculation (min): 95 min    Short Term Goals: Week 4:  OT Short Term Goal 1 (Week 4): STG= LTG d/t ELOS  Skilled Therapeutic Interventions/Progress Updates:    Community outing performed with trip to outside the hospital to [employer]. Focus of outing individualized to pt needs with special focus on anxiety and psychosocial adjustment to being in the community with a new disability. Pt received in the w/c and he was able to hemi propel the w/c to outside the hospital with mod I. He was able to navigate down a slight incline with (S) d/t high speed and stop. He was able to stand from the w/c with CGA and then ascend 3 varying height stairs to enter handicapped accessible van with min A and min cueing for problem solving d/t low ceiling and different body mechanics required. Goals discussed in the [place] ride- including functional mobility around busy, distracting environment with natural obstacles and people, transferring to a booth, safe positioning/utilization of the LUE, and identifying handicap accessible entrances. He exited [place], again with cueing and min A. He completed hemi propulsion of w/c through the parking lot and was able to find/navigate over accessible sidewalk. He ordered his food with appropriate pragmatic awareness. He transferred from w/c to a booth with mod cueing from OT to problem solve placement of the w/c. While he ate his meal, continued discussion re community accessibility, social engagement and importance of getting into the community, as well as mental health discussions. He was engaged throughout meal. He stood from the booth and used a R loft strand crutch he completed 200 ft of functional mobility with min A out of the restaurant and through a busy parking lot. Increased attention  and coordination required to navigate through busy doors as well as awareness to ask for assist if needed to hold doors. He transferred back into the [place] with similar assist as initial transfer. Debriefed in car on the way back, pt more anxious now, discussed this at length. He returned to the unit and to his room. He was left sitting up in the w/c with all needs met.   Therapy Documentation Precautions:  Precautions Precautions: Fall Precaution/Restrictions Comments: left LE tone Restrictions Weight Bearing Restrictions Per Provider Order: No Therapy/Group: Individual Therapy  Crissie Reese 09/16/2023, 6:35 AM

## 2023-09-16 NOTE — Plan of Care (Signed)
  Problem: Consults Goal: RH STROKE PATIENT EDUCATION Description: See Patient Education module for education specifics  Outcome: Progressing   Problem: RH BOWEL ELIMINATION Goal: RH STG MANAGE BOWEL WITH ASSISTANCE Description: STG Manage Bowel with mod I Assistance. Outcome: Progressing   Problem: RH SAFETY Goal: RH STG ADHERE TO SAFETY PRECAUTIONS W/ASSISTANCE/DEVICE Description: STG Adhere to Safety Precautions With cues Assistance/Device. Outcome: Progressing   Problem: RH PAIN MANAGEMENT Goal: RH STG PAIN MANAGED AT OR BELOW PT'S PAIN GOAL Description: < 4 with prns Outcome: Progressing
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
## 2023-09-17 NOTE — Plan of Care (Signed)
  Problem: Consults Goal: RH STROKE PATIENT EDUCATION Description: See Patient Education module for education specifics  Outcome: Progressing   Problem: RH BOWEL ELIMINATION Goal: RH STG MANAGE BOWEL WITH ASSISTANCE Description: STG Manage Bowel with mod I Assistance. Outcome: Progressing   Problem: RH SAFETY Goal: RH STG ADHERE TO SAFETY PRECAUTIONS W/ASSISTANCE/DEVICE Description: STG Adhere to Safety Precautions With cues Assistance/Device. Outcome: Progressing   Problem: RH KNOWLEDGE DEFICIT Goal: RH STG INCREASE KNOWLEDGE OF HYPERTENSION Description: Patient and family will be able to manage HTN with medications and educational resources for dietary modification, lifestyle modification independently. Outcome: Progressing

## 2023-09-17 NOTE — Progress Notes (Signed)
 Occupational Therapy Session Note  Patient Details  Name: Erik Santana. MRN: [id number] Date of Birth: 1991-02-14  Today's Date: 09/17/2023 OT Individual Time: 4098-1191 OT Individual Time Calculation (min): 55 min   Short Term Goals: Week 4:  OT Short Term Goal 1 (Week 4): STG= LTG d/t ELOS  Skilled Therapeutic Interventions/Progress Updates:   Pt received sitting in WC, no presence of pain noted throughout session. Pt requires Max A for donning of L-shoe/AFO, Min A for R-shoe. Pt defers other ADLs to later time, eager to participate in therapeutic activities at gym level. Pt self propels WC with supervision progressing to Mod I. In main therapy gym, LUE mobility assessed, heat applied for ~20 mins in preparation for functional use in session. Pt appreciative of modality, requesting use of heat on R-shoulder for increased stiffness. Pt then positioned parallel to high-low table to fully support LUE, instructed to grab/pull squigz from L-side towards midline to place in R-hand. Pt completes x10 reps of activity, assistance required to bring LUE out of midline back to table-top, verbal/tacile cuing for digital activation for grasp. Encouragement provided for patience with slow LUE movement as patient reached over with RUE to force limb into position.   Briefly reviewed vocational rehabilitation programs with patient, plan to inquire for follow-up education.   Pt ambulates back to room with CGA (HHA) + lofstrand crutch. Pt with increased LLE circumduction, benefiting from L-shoe cover.   Therapy Documentation Precautions:  Precautions Precautions: Fall Precaution/Restrictions Comments: left LE tone Restrictions Weight Bearing Restrictions Per Provider Order: No   Therapy/Group: Individual Therapy  Lou Cal, OTR/L, MSOT  09/17/2023, 6:09 AM

## 2023-09-17 NOTE — Progress Notes (Signed)
 Physical Therapy Session Note  Patient Details  Name: Erik Santana. MRN: [id number] Date of Birth: 10-Aug-1990  Today's Date: 09/17/2023 PT Individual Time: 1401-1457 PT Individual Time Calculation (min): 56 min   Short Term Goals: Week 2:  PT Short Term Goal 1 (Week 2): Pt will complete sit to stand consistently with CGA. PT Short Term Goal 1 - Progress (Week 2): Met PT Short Term Goal 2 (Week 2): Pt will complete bed to chair consistently with minA. PT Short Term Goal 2 - Progress (Week 2): Met PT Short Term Goal 3 (Week 2): Pt will ambulate x25' with minA and LRAD. PT Short Term Goal 3 - Progress (Week 2): Progressing toward goal Week 3:  PT Short Term Goal 1 (Week 3): STGs = LTGs  Skilled Therapeutic Interventions/Progress Updates:   Received pt sitting in WC, pt agreeable to PT treatment, and reported mild pain in low back (unrated). Session with emphasis on functional mobility/transfers, generalized strengthening and endurance, dynamic standing balance/coordination, NMR, and gait training. Donned socks, shoes, L AFO, L shoe cover, and L bledsoe brace with max A.   Pt performed WC mobility 124ft x 2 trials using RUE/RLE and supervision to/from main therapy gym with emphasis on coordination and global strengthening. Stood from [facility] with min A using R loftstrand crutch and ambulated 159ft with loftstrand crutch and min A. Noted L hip circumduction, knee and ankle IR, and decreased weight shifting to L. Worked on blocked practice squats 2x15 using mirror for visual feedback with min A - emphasis on L lateral weight shifting and preventing knee valgus. Pt ambulated additional 1102ft with R loftstrand crutch and min A. Noted improvements in L hip circumduction with tactile cues at L hip to promote weight shifting to L and verbal cues to decrease cadence to allow for adequate L hip flexion.   Transitioned to sit<>stands 2x10 with RLE on 3in step to promote weight shfiting to LLE using mirror  for visual feedback with min A overall. Returned to room and concluded session with pt sitting in [facility] with all needs within reach.   Therapy Documentation Precautions:  Precautions Precautions: Fall Precaution/Restrictions Comments: left LE tone Restrictions Weight Bearing Restrictions Per Provider Order: No  Therapy/Group: Individual Therapy Marlana Salvage Zaunegger Blima Rich PT, DPT 09/17/2023, 7:21 AM

## 2023-09-18 NOTE — Plan of Care (Signed)
  Problem: RH BOWEL ELIMINATION Goal: RH STG MANAGE BOWEL WITH ASSISTANCE Description: STG Manage Bowel with mod I Assistance. Outcome: Progressing   Problem: RH SAFETY Goal: RH STG ADHERE TO SAFETY PRECAUTIONS W/ASSISTANCE/DEVICE Description: STG Adhere to Safety Precautions With cues Assistance/Device. Outcome: Progressing   Problem: RH PAIN MANAGEMENT Goal: RH STG PAIN MANAGED AT OR BELOW PT'S PAIN GOAL Description: < 4 with prns Outcome: Progressing   Problem: RH KNOWLEDGE DEFICIT Goal: RH STG INCREASE KNOWLEDGE OF HYPERTENSION Description: Patient and family will be able to manage HTN with medications and educational resources for dietary modification, lifestyle modification independently. Outcome: Progressing
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
## 2023-09-18 NOTE — Progress Notes (Signed)
 Occupational Therapy Session Note  Patient Details  Name: Erik Santana. MRN: [id number] Date of Birth: 1991-06-15  Today's Date: 09/18/2023 OT Individual Time: 1430-1540 OT Individual Time Calculation (min): 70 min    Short Term Goals: Week 4:  OT Short Term Goal 1 (Week 4): STG= LTG d/t ELOS  Skilled Therapeutic Interventions/Progress Updates:      Therapy Documentation Precautions:  Precautions Precautions: Fall Precaution/Restrictions Comments: left LE tone Restrictions Weight Bearing Restrictions Per Provider Order: No General: "Can we walk a little?" Pt seated in W/C upon OT arrival, agreeable to OT.  Pain: no pain reported  ADL: OT assisted with donning AFO and arm brace at total A, able to complete Rt side shoe and sock.  Balance: OT instructed pt in standing activities in order to promote increased balance strategies with ADL participation. Pt completed activities with intermittent unsupported standing at RW. Pt completed exercises listed below: -standing marches -shoulder flexion with LUE with tapping method for increased muscle activation, able to actively range to 90* with tapping -weight shifting for increased WB on Lt side when standing, mobilizing  Exercises: Pt demonstrated community mobility with W/C in order to prepare for community integration at D/C. Pt propelled in W/C requiring no rest breaks. Pt practices maneuvering into elevators, pulling into table tops, maneuvering around obstacles and propelling at advanced distances. Pt also completing household ambulation with L[place]strand crutch at CGA on uneven terrain with VC for increased internal rotation of Lt hip. OT encouraging pt to complete glute exercises in room in order to increase functional external rotation of hips. Pt with one rest break during ambulation for muscle recovery.    Other Treatments: OT educated pt on setting self up with mental health therapist once D/C in order for decreased anxiety.  OT also educated pt on peer mentorship program to participate as peer mentor for future patients.    Pt seated in W/C at end of session with W/C alarm donned, call light within reach and 4Ps assessed.    Therapy/Group: Individual Therapy  [name], OTD, OTR/L 09/18/2023, 4:23 PM

## 2023-09-18 NOTE — Progress Notes (Signed)
 Occupational Therapy Session Note  Patient Details  Name: Erik Santana. MRN: [id number] Date of Birth: 1990/08/31  Today's Date: 09/18/2023 OT Individual Time: 1125-1210 OT Individual Time Calculation (min): 45 min    Short Term Goals: Week 4:  OT Short Term Goal 1 (Week 4): STG= LTG d/t ELOS  Skilled Therapeutic Interventions/Progress Updates:   Pt received sitting in [facility] for additional therapy session. Pt with no reports of pain, receptive for BADL retraining at shower-level. Pt requires Min A to bump WC into bathroom (over threshold). Stand-pivot from WC<>TTB with Min A fading to CGA + unilateral support on grab bar. Pt doffs LB garments lateral leans, Min A for unthreading LLE. CGA + no UE support for hike of garments over bottom. Pt manages UB garments with setup/supervision. B socks with setup/supervision. Pt bathes with setup/supervision, lateral leans for posterior pericare, A to bathe back to remove KT tape. Pt integrates LUE into tasks without cuing, able to squeeze bottle of body wash and apply deodorant. Pt and OT revisit vocational rehabilitation opportunities, plans to follow-up with SW/primary therapists. Pt remained sitting in WC, all needs within reach.   Therapy Documentation Precautions:  Precautions Precautions: Fall Precaution/Restrictions Comments: left LE tone Restrictions Weight Bearing Restrictions Per Provider Order: No  Therapy/Group: Individual Therapy  Lou Cal, OTR/L, MSOT  09/18/2023, 4:06 PM

## 2023-09-19 LAB — BASIC METABOLIC PANEL
Anion gap: 8 (ref 5–15)
BUN: 13 mg/dL (ref 6–20)
CO2: 29 mmol/L (ref 22–32)
Calcium: 9.3 mg/dL (ref 8.9–10.3)
Chloride: 102 mmol/L (ref 98–111)
Creatinine, Ser: 1 mg/dL (ref 0.61–1.24)
GFR, Estimated: >60 mL/min (ref >60)
Glucose, Bld: 84 mg/dL (ref 70–99)
Potassium: 3.7 mmol/L (ref 3.5–5.1)
Sodium: 139 mmol/L (ref 135–145)

## 2023-09-19 LAB — CBC
HCT: 44.7 % (ref 39.0–52.0)
Hemoglobin: 15.3 g/dL (ref 13.0–17.0)
MCH: 28.8 pg (ref 26.0–34.0)
MCHC: 34.2 g/dL (ref 30.0–36.0)
MCV: 84.2 fL (ref 80.0–100.0)
Platelets: 297 10*3/uL (ref 150–400)
RBC: 5.31 MIL/uL (ref 4.22–5.81)
RDW: 12.6 % (ref 11.5–15.5)
WBC: 6.9 10*3/uL (ref 4.0–10.5)
nRBC: 0 % (ref 0.0–0.2)
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
## 2023-09-19 NOTE — Progress Notes (Signed)
 Occupational Therapy Session Note  Patient Details  Name: Erik Santana. MRN: [id number] Date of Birth: 06-23-1991  Today's Date: 09/19/2023 OT Individual Time: 0905-1005 OT Individual Time Calculation (min): 60 min     Skilled Therapeutic Interventions/Progress Updates: Patient received sitting in w/c motivated to work on [employer]. Patient propelled self to the day room for LUE pre reach wt bearing exercise. Worked on maintaining LUE wt bearing at shoulder height with varied level of assist at triceps and through wrist. Patient needing Moderate assist initially to maintain wt bearing through heel of hand and triceps cuing for elbow extension into full LUE extended reach. Patient able to hold wt bearing for 3-6 seconds with lessoning therapist assist. Followed pre reach push for hand slides up the wall following same cuing for pressure through the heel of the hand and focusing on triceps activation to improve elbow extension. Used cloth under hand to allow patient to slide hand up the wall. Continued treatment with patient education on FM strength and coordination tasks using yellow theraputty. Patient with good return demo of simple pinch and lateral movement exercises. Concluded treatment with assist for ADL's as listed below. Patient able to perform shower seated on tub bench with close supervision but no assist physically. Continue with skilled OT POC to improve independence and functional use of the LUE.     Therapy Documentation Precautions:  Precautions Precautions: Fall Precaution/Restrictions Comments: left LE tone Restrictions Weight Bearing Restrictions Per Provider Order: No  Pain: Pain Assessment Pain Scale: 0-10 Pain Score: No c/o during treatment    ADL: ADL Grooming: set up assistance Where Assessed-Grooming: Wheelchair Upper Body Bathing: SPV Where Assessed-Upper Body Bathing: Shower Lower Body Bathing: SPV Where Assessed-Lower Body Bathing: Water quality scientist  Dressing: set up Where Assessed-Upper Body Dressing: Wheelchair,  Lower Body Dressing: CGA  Psychologist, counselling Transfer: CGA Film/video editor Method: Administrator: Emergency planning/management officer    Therapy/Group: Individual Therapy  Warnell Forester 09/19/2023, 11:17 AM

## 2023-09-19 NOTE — Progress Notes (Signed)
 Physical Therapy Session Note  Patient Details  Name: Erik Santana. MRN: [id number] Date of Birth: 03/24/1991  Today's Date: 09/19/2023 PT Individual Time: 1050-1200 PT Individual Time Calculation (min): 70 min   Today's Date: 09/19/2023 PT Individual Time: 1350-1500 PT Individual Time Calculation (min): 70 min   Short Term Goals: Week 3:  PT Short Term Goal 1 (Week 3): STGs = LTGs  Skilled Therapeutic Interventions/Progress Updates:     1st Session: Pt received seated in WC and agrees to therapy. No complaint of pain. Lt hand noted to be functioning much better than previous sessions with this PT! Pt self propels WC x150' with Rt hemibody for endurance training and functional mobility training.  Pt performs squat pivot to mat table with CGA and cues for positioning and body mechanics. Pt transitions to straddling blue platform to promote symmetrical WB between Rt and Lt legs. Pt performs 3x15 sit to stand transitions with PT providing minA at trunk to promote Lt sided weight shifting and neutral rotation of Lt hip. Pt noted to have good contraction in LLE during activity. Pt stands and turns to face elevated mat table, cued to WB through BUEs to provide NM feedback through LUE. Pt performs partial "push-ups" from this position, with Rt hand over Lt hand for over pressure. PT provides tactile cues to promote equal distribution of weight bearing. Pt then ambulates x150' with minA and no AD, with emphasis on increasing Rt stride length to promote Lt knee flexion and optimal mechanics for swing through of LLE. WC transport back to room. Left seated with all needs within reach.   2nd Session: Pt received supine in bed and agrees to therapy. No complaint of pain. Pt performs bed mobility with cues for positioning. Squat pivot to WC with CGA and cues for initiation. Pt self propels WC with Rt hemibody to work on endurance and mobility training. Pt performs squat pivot to mat table with same  assistance and cues. Pt then ambulates x190' with Rt lofstrand crutch and minA with cue to kick yoga block with LLE to promote increased hip flexor and knee extensor activation. PT provides cues for lateral weight shifting and body mechanics. Pt then completes repeated thigh taps, facing parallel bars with mirror for visual feedback. Pt cued to extend LLE for starting point to promote soft tissue stretch of Lt hip flexors for improved ease of activation, then moving through hip ROM with cues to maintain neutral posture to prevent compensatory movements. Pt then performs sidesteping ot the Lt and Rt while holding onto bar with RUE and utilizing mirror for visual feedback. Pt noted to have increased difficulty with Rt sidestepping versus Lt. PT provides verbal and tactile cues for NM feedback as well as cueing for neutral and stationary upper body.   Pt ambulates back to room, x200', with CGA/minA and RUE lofstrand crutch. Shoe cover placed on LLE to limit friction and allow for increased ease of progression in desired sagittal plane. Pt left seated in bed with all needs within reach.   Therapy Documentation Precautions:  Precautions Precautions: Fall Precaution/Restrictions Comments: left LE tone Restrictions Weight Bearing Restrictions Per Provider Order: No   Therapy/Group: Individual Therapy  [name], PT, DPT 09/19/2023, 4:15 PM

## 2023-09-19 NOTE — Plan of Care (Signed)
  Problem: RH BOWEL ELIMINATION Goal: RH STG MANAGE BOWEL WITH ASSISTANCE Description: STG Manage Bowel with mod I Assistance. Outcome: Progressing   Problem: RH SAFETY Goal: RH STG ADHERE TO SAFETY PRECAUTIONS W/ASSISTANCE/DEVICE Description: STG Adhere to Safety Precautions With cues Assistance/Device. Outcome: Progressing   Problem: RH PAIN MANAGEMENT Goal: RH STG PAIN MANAGED AT OR BELOW PT'S PAIN GOAL Description: < 4 with prns Outcome: Progressing   Problem: RH KNOWLEDGE DEFICIT Goal: RH STG INCREASE KNOWLEDGE OF HYPERTENSION Description: Patient and family will be able to manage HTN with medications and educational resources for dietary modification, lifestyle modification independently. Outcome: Progressing

## 2023-09-19 NOTE — Plan of Care (Signed)
  Problem: Consults Goal: RH STROKE PATIENT EDUCATION Description: See Patient Education module for education specifics  Outcome: Progressing   Problem: RH BOWEL ELIMINATION Goal: RH STG MANAGE BOWEL WITH ASSISTANCE Description: STG Manage Bowel with mod I Assistance. Outcome: Progressing Goal: RH STG MANAGE BOWEL W/MEDICATION W/ASSISTANCE Description: STG Manage Bowel with Medication with  mod I Assistance. Outcome: Progressing   Problem: RH SAFETY Goal: RH STG ADHERE TO SAFETY PRECAUTIONS W/ASSISTANCE/DEVICE Description: STG Adhere to Safety Precautions With cues Assistance/Device. Outcome: Progressing   Problem: RH PAIN MANAGEMENT Goal: RH STG PAIN MANAGED AT OR BELOW PT'S PAIN GOAL Description: < 4 with prns Outcome: Progressing   Problem: RH KNOWLEDGE DEFICIT Goal: RH STG INCREASE KNOWLEDGE OF HYPERTENSION Description: Patient and family will be able to manage HTN with medications and educational resources for dietary modification, lifestyle modification independently. Outcome: Progressing Goal: RH STG INCREASE KNOWLEGDE OF HYPERLIPIDEMIA Description: Patient and family will be able to manage HLD with medications and educational resources for dietary modification, lifestyle modification independently. Outcome: Progressing Goal: RH STG INCREASE KNOWLEDGE OF STROKE PROPHYLAXIS Description: Patient and family will be able to manage secondary risks with medications and educational resources for dietary modification, lifestyle modification independently. Outcome: Progressing

## 2023-09-20 MED ORDER — ACETAMINOPHEN 325 MG PO TABS
325.0000 mg | ORAL_TABLET | Freq: Four times a day (QID) | ORAL | Status: AC | PRN
Start: 2023-09-20 — End: ?

## 2023-09-20 NOTE — Progress Notes (Signed)
 Patient ID: Erik Santana., male   DOB: 29-Mar-1991, 33 y.o.   MRN: [id number]  SW met with pt in room to provide updates from team conference, d/c date remains 3.27, and DME- [employer] and [employer]. He is very worried about bills and recovery since unable to work, Long discussion about counseling and he is open to this. SW will submit [facility] referral. SW will follow-up with [facility] to determine if a referral to [facility] was placed.   1450- SW spoke with pt mother to discuss above.   SW ordered DME with [employer] via parachute.  Cecile Sheerer, MSW, LCSW Office: [phone] Cell: [phone] Fax: [phone]

## 2023-09-20 NOTE — Progress Notes (Signed)
 Physical Therapy Session Note  Patient Details  Name: Erik Santana. MRN: [id number] Date of Birth: 02-Nov-1990  Today's Date: 09/20/2023 PT Individual Time: 0808-0905 PT Individual Time Calculation (min): 57 min   Short Term Goals: Week 3:  PT Short Term Goal 1 (Week 3): STGs = LTGs  Skilled Therapeutic Interventions/Progress Updates: Patient sitting in WC on entrance to room. Patient alert and agreeable to PT session.   Patient reported 5-6/10 pain in low back and L shoulder. Active ROM and rest breaks provided (RN already aware). Beginning of session focused on building pt rapport with pt stating recent improvement in ability to actively move L UE (able to control eccentric, but lacking active flexion and has to place via R UE). Pt also stated receiving new shoes to place L AFO in with greater flexibility on soles of shoe vs previous shoe. PTA donned B shoes and AFO for time management. Pt propelled WC from room<>main gym with supervision and R hemi-technique and demonstrated safe navigation of obstacles in hallway. Pt participated in NMRE of L LE noted bellow.  Neuromuscular Re-ed: NMR facilitated during session with focus on dynamic standing balance, coordination, L LE neuromuscular connection, postural control. - Pt in // bars with yellow theraband donned on L LE around ankle with cues to tap discs on platform (vertically placed with tape). Band adjusted at first to increase L abductor activation, then positioned to bias hip flexors. Pt with decreased hip flexion with hip compensating into flexion noted.   - Band donned around distal L thigh as tactile feedback to improve hip flexion (cue to bring L knee towards // bar). Pt with R UE support on bar, and required manual facilitation of upright posture due to hip flexion compensation. Pt had improved posture on final reps performed with slight increase in ability to control coordination to discs. Short seated rest break provided. - 5lb  ankle weight donned on distal aspect of L thigh to improve hip flexion while ambulating short distance with R lofstrand crutch. Pt required minA to maintain ambulatory balance. Cones set up on floor with pt cued to ambulate on R side while avoiding knocking cones over (pt circumducts L LE to advance). Pt knocked over 75% of cones with added cues to step intentionally (decrease cadence).  - mini squats performed with pt holding squat position for 2-3 seconds. minA throughout required. Added cues for pt to maintain neutral alignment of L knee (avoid internal rotation).   NMR performed for improvements in motor control and coordination, balance, sequencing, judgement, and self confidence/ efficacy in performing all aspects of mobility at highest level of independence.   Patient sitting in WC at end of session with brakes locked, and all needs within reach.      Therapy Documentation Precautions:  Precautions Precautions: Fall Precaution/Restrictions Comments: left LE tone Restrictions Weight Bearing Restrictions Per Provider Order: No  Therapy/Group: Individual Therapy  Milam Allbaugh PTA 09/20/2023, 12:29 PM
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
## 2023-09-20 NOTE — Progress Notes (Signed)
 Patient ID: Erik Santana., male   DOB: 1990/08/01, 33 y.o.   MRN: [id number]  SW emailed PCS referral to [facility] Management Dept ([email or web address]).  SW waiting on updates from [place] to determine if [facility] referral was submitted.   SW faxed outpatient PT/OT referral to [facility] Neuro rehab (aquatic therapy).   *SW received updates from [employer] reporting forearm crutches are not in stock and will be shipped to the home. SW spoke wit pt mother to inform on above. She reports she has the crutch she initially ordered so they are ok for now.   Cecile Sheerer, MSW, LCSW Office: [phone] Cell: [phone] Fax: [phone]

## 2023-09-20 NOTE — Progress Notes (Signed)
 Physical Therapy Session Note  Patient Details  Name: Erik Santana. MRN: [id number] Date of Birth: 1991-03-26  Today's Date: 09/20/2023 PT Individual Time: 1101-1200 PT Individual Time Calculation (min): 59 min   Today's Date: 09/20/2023 PT Individual Time: 1300-1359 PT Individual Time Calculation (min): 59 min   Short Term Goals: Week 3:  PT Short Term Goal 1 (Week 3): STGs = LTGs  Skilled Therapeutic Interventions/Progress Updates:     1st Session: Pt received seated in WC with OT present and WC rep for specialty WC evaluation. Pt does not complain of pain. PT provides recommendations regarding specific WC features that would benefit pt's functional mobility and community participation. Following. Pt self propels WC x200' with bilateral lower extremities to work on [employer] for LLE as well as endurance and functional mobility training. Pt performs stair training with 8" platform. Pt completes x2 with Rt lofstrand crutch and minA, with cues for step sequencing and body mechanics. WC transport back to room. Left seated with all needs within reach.   2nd Session: Pt received seated in [facility] and agrees to therapy. No complaint of pain.  [name] with [employer] present to evaluate AFO and leather sole of shoes. Pt ambulates x50' with CGA/minA and no AD, with cues to attempt to control knee hyperextension in loading phase of LLE. Pt self propels WC x150' with BLEs to promote functional mobility training and NMR for LLE. Pt stands and ambulates x175' with CGA/minA, with cues to lengthen LLE stride to promote heel strike at initial contact to limit tendency for knee hyperextension. Pt takes seated rest break. Pt performs "crunches" in short sitting on edge of mat to provide core strengthening as well as targeting LLE hip flexor. Pt completes 2x15 with rest break, then progresses activity by holding onto 6lb medicine ball with BUEs to provide increased resistance for core as well as NMR for LUE. Pt completes 3x15  with 6lb medicine ball. Pt ambulates back to room with RUE lofstrand crutch and CGA/minA, with cues for neutral rotation of Lt hip due to tendency to internally rotate hip during swing phase. Pt left seated with all need within reach.   Therapy Documentation Precautions:  Precautions Precautions: Fall Precaution/Restrictions Comments: left LE tone Restrictions Weight Bearing Restrictions Per Provider Order: No    Therapy/Group: Individual Therapy  [name], PT, DPT 09/20/2023, 4:38 PM

## 2023-09-20 NOTE — Progress Notes (Signed)
 Occupational Therapy Session Note  Patient Details  Name: Erik Santana. MRN: [id number] Date of Birth: Jul 07, 1990  Today's Date: 09/20/2023 OT Individual Time: 1030-1100 OT Individual Time Calculation (min): 30 min    Short Term Goals: Week 4:  OT Short Term Goal 1 (Week 4): STG= LTG d/t ELOS  Skilled Therapeutic Interventions/Progress Updates:    Pt received in his w/c with no c/o pain, requesting to change clothes. He changed his shirt with set up assist. LB dressing with min A d/t AFO already being on. Reviewed fall risk reduction education. He hemi propelled the w/c to the therapy gym with mod I. Remainder of session focused on LUE NMR. Pt with MUCH improved LUE volitional control distally. He completed cone stacking activity to address functional reaching patterns, focusing especially on extension to reduce flexor synergy tendency. He required only min facilitation when working in 10-20 degrees shoulder flexion requirements. Full elbow flexion today and 75% extension against gravity. He was even able to isolate modified pincer grasp with min OT facilitation and is beginning to develop functional hand prehension skills. He worked on Loss adjuster, chartered when reaching across midline to target held by RUE to develop bimanual integration. He transitioned to custom wheel chair eval in the gym with [employer] rep, OT and PT advising- see PT note for details.   Therapy Documentation Precautions:  Precautions Precautions: Fall Precaution/Restrictions Comments: left LE tone Restrictions Weight Bearing Restrictions Per Provider Order: No  Therapy/Group: Individual Therapy  Crissie Reese 09/20/2023, 6:24 AM

## 2023-09-20 NOTE — Patient Care Conference (Signed)
 Inpatient RehabilitationTeam Conference and Plan of Care Update Date: 09/20/2023   Time: 1007 am    Patient Name: Erik Santana.      Medical Record Number: [id number]  Date of Birth: 1990-07-13 Sex: Male         Room/Bed: 4W01C/4W01C-01 Payor Info: Payor: [place] MEDICAID PREPAID HEALTH PLAN / Plan: [place] MEDICAID [facility] / Product Type: *No Product type* /    Admit Date/Time:  08/24/2023  3:07 PM  Primary Diagnosis:  Intraparenchymal hematoma of brain [facility])  Hospital Problems: Principal Problem:   Intraparenchymal hematoma of brain [facility]) Active Problems:   Cognitive change    Expected Discharge Date: Expected Discharge Date: 09/22/23  Team Members Present: Physician leading conference: Dr. Elijah Birk Social Worker Present: Cecile Sheerer, LCSWA Nurse Present: Konrad Dolores, RN PT Present: Malachi Pro, PT OT Present: [name], OT SLP Present: Other (comment) Alvera Novel SLP)     Current Status/Progress Goal Weekly Team Focus  Bowel/Bladder   Voiding with no difficulty. Last BM 09/17/23    Time toileting    Maintain continence of bowel and bladder   Swallow/Nutrition/ Hydration               ADL's   Supervision with stand/squat pivot transfers, (S) UB ADLs, CGA LB ADLs. DId great on his outing. family edu done   Supervision   LUE NMR,  d/c planning, ADLs, transfers    Mobility   supervision bed mobility and transfers, CGA/minA gait with lofstrand crutch   supervision transfers, minA ambulation  Lt hemibody NMR, ambulation, stair training, DC prep    Communication                Safety/Cognition/ Behavioral Observations               Pain   Pain is fairly well controlled with scheduled muscle relaxers and PRN tramadol Q6. Pt reports pain to back, left arm and left knee.      <4 w/ prns    <4 w/ prns  Skin   Skin in intact with no breakdown. Surgical site to left wrist free from complications.     Skin free from skin breakdown      Maintain skin free from any breakdown    Discharge Planning:  Pt now has managed Medicaid-[employer]. Pt will discharge to home with his mother who will be primary caregiver. Pt mother works first shift; pt sister to help as she works second shift. per mother, sister will stop working and move to 3rd shift if needed. Fam edu completed with pt mother on Friday. Specialty w/ with [employer]. SW will confirm there are no barriers to discharge.    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 Patient limited by spasticity, pain and anxiety.  Patient on target to meet rehab goals: yes, Patient continues to make great improvement. Patient requires supervision with  UB care and CGA with  lower body care. Patient is able to complete a squat pivot with CGA. Patient requires CGA/min A with ambulation using a lofstrand crutch. Overall goals for discharge are set at supervision - min assistance.    *See Care Plan and progress notes for long and short-term goals.   Revisions to Treatment Plan:  AFO consult Mirror therapy Recreation therapy  Coping strategies Wheelchair evaluation  Teaching Needs: Safety, medications, toileting, transfers , dietary recommendations, polysubstance education, tobacco education, etc.    Current Barriers to Discharge: Decreased caregiver support  Possible Resolutions to Barriers: Family education Outpatient follow-up DME: W/C, TTB     Medical Summary Current Status: Medically complicated by central vertigo, low back pain, myoclonus, anxiety, and spasticity  Barriers to Discharge: Behavior/Mood;Medical stability;Uncontrolled Pain;Self-care education;Spasticity   Possible Resolutions to [employer] Focus: Medication management for spasticity and myoclonus, pain control with minimal tolerable dosing of narcotic medications, psychological support and medication adjustment for  anxiety/insomnia, bracing to prevent contractures and improved posturing   Continued Need for Acute Rehabilitation Level of Care: The patient requires daily medical management by a physician with specialized training in physical medicine and rehabilitation for the following reasons: Direction of a multidisciplinary physical rehabilitation program to maximize functional independence : Yes Medical management of patient stability for increased activity during participation in an intensive rehabilitation regime.: Yes Analysis of laboratory values and/or radiology reports with any subsequent need for medication adjustment and/or medical intervention. : Yes   I attest that I was present, lead the team conference, and concur with the assessment and plan of the team.   Gwenyth Allegra 09/20/2023, 2:31 PM

## 2023-09-21 ENCOUNTER — Telehealth (HOSPITAL_COMMUNITY): Payer: Self-pay

## 2023-09-21 ENCOUNTER — Other Ambulatory Visit (HOSPITAL_COMMUNITY): Payer: Self-pay

## 2023-09-21 LAB — COMPREHENSIVE METABOLIC PANEL
ALT: 29 U/L (ref 0–44)
AST: 24 U/L (ref 15–41)
Albumin: 4 g/dL (ref 3.5–5.0)
Alkaline Phosphatase: 57 U/L (ref 38–126)
Anion gap: 10 (ref 5–15)
BUN: 11 mg/dL (ref 6–20)
CO2: 27 mmol/L (ref 22–32)
Calcium: 9.8 mg/dL (ref 8.9–10.3)
Chloride: 102 mmol/L (ref 98–111)
Creatinine, Ser: 0.98 mg/dL (ref 0.61–1.24)
GFR, Estimated: >60 mL/min (ref >60)
Glucose, Bld: 79 mg/dL (ref 70–99)
Potassium: 4.1 mmol/L (ref 3.5–5.1)
Sodium: 139 mmol/L (ref 135–145)
Total Bilirubin: 0.6 mg/dL (ref 0.0–1.2)
Total Protein: 7.1 g/dL (ref 6.5–8.1)

## 2023-09-21 MED ORDER — TIZANIDINE HCL 4 MG PO TABS
4.0000 mg | ORAL_TABLET | Freq: Three times a day (TID) | ORAL | 0 refills | Status: DC
  Filled 2023-09-21: qty 90, 30d supply, fill #0

## 2023-09-21 MED ORDER — BUSPIRONE HCL 10 MG PO TABS
10.0000 mg | ORAL_TABLET | Freq: Three times a day (TID) | ORAL | 0 refills | Status: DC
Start: 2023-09-21 — End: 2023-10-05
  Filled 2023-09-21: qty 90, 30d supply, fill #0

## 2023-09-21 MED ORDER — DIVALPROEX SODIUM ER 500 MG PO TB24
500.0000 mg | ORAL_TABLET | Freq: Every day | ORAL | 0 refills | Status: DC
  Filled 2023-09-21: qty 30, 30d supply, fill #0

## 2023-09-21 MED ORDER — VITAMIN D (ERGOCALCIFEROL) 1.25 MG (50000 UNIT) PO CAPS
50000.0000 [IU] | ORAL_CAPSULE | ORAL | 0 refills | Status: DC
  Filled 2023-09-21: qty 5, 35d supply, fill #0

## 2023-09-21 MED ORDER — SCOPOLAMINE 1 MG/3DAYS TD PT72
1.0000 | MEDICATED_PATCH | TRANSDERMAL | 0 refills | Status: DC
  Filled 2023-09-21: qty 10, 30d supply, fill #0

## 2023-09-21 MED ORDER — TRAMADOL HCL 50 MG PO TABS
50.0000 mg | ORAL_TABLET | Freq: Four times a day (QID) | ORAL | 0 refills | Status: DC | PRN
Start: 2023-09-21 — End: 2023-10-05
  Filled 2023-09-21: qty 30, 5d supply, fill #0

## 2023-09-21 MED ORDER — HYDROXYZINE HCL 25 MG PO TABS
25.0000 mg | ORAL_TABLET | Freq: Three times a day (TID) | ORAL | 0 refills | Status: DC | PRN
Start: 2023-09-21 — End: 2023-10-05
  Filled 2023-09-21: qty 30, 10d supply, fill #0

## 2023-09-21 MED ORDER — BACLOFEN 10 MG PO TABS
15.0000 mg | ORAL_TABLET | Freq: Three times a day (TID) | ORAL | 0 refills | Status: DC
  Filled 2023-09-21: qty 90, 20d supply, fill #0

## 2023-09-21 MED ORDER — LORATADINE 10 MG PO TABS
10.0000 mg | ORAL_TABLET | Freq: Every day | ORAL | 0 refills | Status: DC
  Filled 2023-09-21: qty 30, 30d supply, fill #0

## 2023-09-21 MED ORDER — TRAZODONE HCL 100 MG PO TABS
100.0000 mg | ORAL_TABLET | Freq: Every day | ORAL | 0 refills | Status: DC
  Filled 2023-09-21: qty 30, 30d supply, fill #0

## 2023-09-21 MED ORDER — MECLIZINE HCL 25 MG PO TABS
25.0000 mg | ORAL_TABLET | Freq: Three times a day (TID) | ORAL | 0 refills | Status: DC | PRN
  Filled 2023-09-21: qty 30, 10d supply, fill #0

## 2023-09-21 MED ORDER — LIDOCAINE 5 % EX PTCH
1.0000 | MEDICATED_PATCH | Freq: Two times a day (BID) | CUTANEOUS | 0 refills | Status: DC | PRN
  Filled 2023-09-21: qty 30, 15d supply, fill #0

## 2023-09-21 NOTE — Progress Notes (Signed)
 Occupational Therapy Session Note  Patient Details  Name: Erik Santana. MRN: [id number] Date of Birth: 08-25-1990  Today's Date: 09/21/2023 OT Individual Time: 3474-2595 OT Individual Time Calculation (min): 55 min    Short Term Goals: Week 4:  OT Short Term Goal 1 (Week 4): STG= LTG d/t ELOS  Skilled Therapeutic Interventions/Progress Updates:    Pt received sitting in the w/c with no c/o pain, agreeable to OT session. He propelled the w/c into the bathroom with (S). Stand pivot into the walk in shower with close (S). Good placement/management of the LUE/LLE during transfer. He doffed all clothing sit <> stand with good safety awareness, (S). He completed all bathing seated with lateral leans for LB with distant/intermittent (S). He returned to the w/c, stand pivot with (S). He donned a shirt with mod I. (S) with LB dressing. Pants donned with (S). Discussed discharge planning and progress within OT POC at length. Pt was left sitting up in the w/c with all needs met and call bell within reach.    Saebo Stim One was placed on his L tricep to promote maximal muscle activation and tone management. 60 min unattended e-stim. No c/o pain, no adverse skin reaction.  330 pulse width 35 Hz pulse rate On 8 sec/ off 8 sec Ramp up/ down 2 sec Symmetrical Biphasic wave form  Max intensity at 500 Ohm load   Therapy Documentation Precautions:  Precautions Precautions: Fall Precaution/Restrictions Comments: left tone Restrictions Weight Bearing Restrictions Per Provider Order: No  Therapy/Group: Individual Therapy  Crissie Reese 09/21/2023, 10:15 AM
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
## 2023-09-21 NOTE — Progress Notes (Signed)
 Inpatient Rehabilitation Discharge Medication Review by a Pharmacist  A complete drug regimen review was completed for this patient to identify any potential clinically significant medication issues.  High Risk Drug Classes Is patient taking? Indication by Medication  Antipsychotic No   Anticoagulant No   Antibiotic No   Opioid Yes PRN Tramadol - severe pain  Antiplatelet No   Hypoglycemics/insulin No   Vasoactive Medication No   Chemotherapy No   Other Yes Baclofen - spasticity, myoclonus Buspirone - anxiety Divalproex ER - mood stablization, myoclonus Loratadine - seasonal allergies Scopolamine patch - dizziness, vertigo Tizanidine - muscle relaxaer Trazodone - sleep Vitamin D weekly - supplement  PRNs: Acetaminophen - mild or moderate pain, fever Hydroxyzine - anxiety Lidocaine patch - topical pain relief Meclizine - dizziness     Type of Medication Issue Identified Description of Issue Recommendation(s)  Drug Interaction(s) (clinically significant)     Duplicate Therapy     Allergy     No Medication Administration End Date     Incorrect Dose     Additional Drug Therapy Needed     Significant med changes from prior encounter (inform family/care partners about these prior to discharge). New baclofen, buspirone, divalproex ER, loratadine, scopolamine patch, tinazidine, weekly Vitamin D, Trazodone; prn hydroxyzine, lidocaine patch, tramadol, meclizine.  Discontinued PRN Ibuprofen and Nyquil Severe Cold/Flu during inpatient admit. Communicate changes with patient/family prior to discharge.  Other       Clinically significant medication issues were identified that warrant physician communication and completion of prescribed/recommended actions by midnight of the next day:  No  Pharmacist comments:  - Has been using Zolpidem hs prn sleep; not prescribed at discharge per noted plan. - Has also been on Flonase nasal spray daily, not ordered for discharge.  Time  spent performing this drug regimen review (minutes):  [address]   Dennie Fetters, [place] 09/21/2023 6:20 PM

## 2023-09-21 NOTE — Progress Notes (Signed)
 Physical Therapy Discharge Summary  Patient Details  Name: Erik Santana. MRN: [id number] Date of Birth: 10-20-1990  Date of Discharge from PT service:September 21, 2023  Today's Date: 09/21/2023 PT Individual Time: 1116-1200 PT Individual Time Calculation (min): 44 min   Today's Date: 09/21/2023 PT Individual Time: 4098-1191 PT Individual Time Calculation (min): 57 min    Patient has met 9 of 9 long term goals due to improved activity tolerance, improved balance, improved postural control, increased strength, and improved coordination.  Patient to discharge at an ambulatory level Min Assist and mod(I) for [facility] mobility. Patient's care partner is independent to provide the necessary physical assistance at discharge.  Reasons goals not met: NA  Recommendation:  Patient will benefit from ongoing skilled PT services in outpatient setting to continue to advance safe functional mobility, address ongoing impairments in Lt hemibody strength and coordination, balance, ambulation, and minimize fall risk.  Equipment: Ultralight K0005 Manual WC  Reasons for discharge: treatment goals met and discharge from hospital  Patient/family agrees with progress made and goals achieved: Yes  Skilled Therapeutic Interventions: 1st Session: Pt received seated in [facility] and agrees to therapy. No complaint of pain. Pt self propels WC x250' with bilateral lower extremities to work on LLE NMR as well as endurance and mobility training. Pt stands with cues for initiation and hand placement. Pt ambulates x175' with CGA/minA and RUE loftstrand crutch, with cues for neutral posture, Lt lateral weight shifting, maintaining neutral rotation of LLE, and attempting reciprocal gait pattern. Seated rest break. Pt performs squat pivot to mat table with cues for positioning. Pt transitions to supine for NMR of LLE. Pt completes x15 hip ab/duction in supine with PT providing assistance to lift foot 1" from mat and pt performing  remainder of AROM. Following, pt performs x15 LLE lifts with cues for correct performance. Pt then completes 2x15 heel slides for NMR and promoting motor planning of LLE. Pt performs 3x15 bridges in hooklying with BLEs, with 15 count hold on final rep. Pt performs 1x15 LLE single leg bridges with cues for NM feedback and correct performance. Pt ambulates x25' back to [facility] with CGA. Left sated with all needs within reach.   2nd Session: Pt received seated in [facility] and agrees to therapy. No complaint of pain. Pt's loaner WC delivered at initiation of session and PT and rep discuss aspects of chair and methods for adjusting chair. Pt transfers from CIR WC to loaner WC with close supervision and cues for sequencing and safety. Pt self propels WC x300' with RUE and RLE without cueing. Pt completes car transfer with close supervision and cues for sequencing and safety. Following, pt completes ramp navigation with Rt handrail and CGA, with cues to limite Lt knee hyperextension during loading portion of stance phase. Pt completes x12 6" steps with Rt handrail and CGA, with cues for step sequencing and safety. Pt then complete biodex weight distribution activity to promote Lt hemibody weightbearing. Pt able to have 50% body weight in LLE ~75% of 3:00 without upper extremity support. PT provides cues for body mechanics and posture. Pt self propels WC to dayroom. Pt completes Wii bowling activity for balance training with dynamic upper body movements, with visual feedback. PT provides CGA but pt does not have any overt LOBs. Pt left seated in WC with all needs within reach.   PT Discharge Precautions/Restrictions Precautions Precautions: Fall Precaution/Restrictions Comments: left tone Restrictions Weight Bearing Restrictions Per Provider Order: No Pain Interference Pain Interference Pain Effect on  Sleep: 2. Occasionally Pain Interference with Therapy Activities: 2. Occasionally Pain Interference with Day-to-Day  Activities: 1. Rarely or not at all Vision/Perception  Vision - History Ability to See in Adequate Light: 0 Adequate Perception Perception: Within Functional Limits Praxis Praxis: WFL  Cognition Overall Cognitive Status: Within Functional Limits for tasks assessed Arousal/Alertness: Awake/alert Orientation Level: Oriented X4 Attention: Selective Selective Attention: Appears intact Memory: Appears intact Awareness: Appears intact Problem Solving: Appears intact Safety/Judgment: Appears intact Sensation Sensation Light Touch: Appears Intact Proprioception: Impaired Detail Proprioception Impaired Details: Impaired LUE;Impaired LLE Coordination Gross Motor Movements are Fluid and Coordinated: No Fine Motor Movements are Fluid and Coordinated: No Coordination and Movement Description: L hemi Motor  Motor Motor: Abnormal tone;Hemiplegia;Primitive reflexes present;Abnormal postural alignment and control Motor - Discharge Observations: L hemi- much improved  Mobility Bed Mobility Bed Mobility: Supine to Sit;Sit to Supine Supine to Sit: Supervision/Verbal cueing Sit to Supine: Supervision/Verbal cueing Transfers Transfers: Sit to Stand;Stand to Sit;Stand Pivot Transfers;Squat Pivot Transfers Sit to Stand: Supervision/Verbal cueing Stand to Sit: Supervision/Verbal cueing Stand Pivot Transfers: Supervision/Verbal cueing Stand Pivot Transfer Details: Verbal cues for technique;Verbal cues for sequencing Squat Pivot Transfers: Independent with assistive device Transfer (Assistive device): Lofstrands Locomotion  Gait Ambulation: Yes Gait Assistance: Minimal Assistance - Patient > 75% Gait Distance (Feet): 175 Feet Assistive device: Lofstrands Gait Assistance Details: Verbal cues for sequencing;Verbal cues for technique;Verbal cues for gait pattern Gait Gait: Yes Gait Pattern: Impaired Gait Pattern:  (LLE circumduction, knee hyperextension in stance phase) Stairs / Additional  Locomotion Stairs: Yes Stairs Assistance: Minimal Assistance - Patient > 75% Stair Management Technique: One rail Right Number of Stairs: 12 Height of Stairs: 6 Ramp: Minimal Assistance - Patient >75% Curb: Minimal Assistance - Patient >75% Naval architect Mobility: Yes Wheelchair Assistance: Independent with Scientist, research (life sciences): Both lower extermities Wheelchair Parts Management: Independent Distance: 300'  Trunk/Postural Assessment  Cervical Assessment Cervical Assessment: Within Functional Limits Thoracic Assessment Thoracic Assessment: Within Functional Limits Lumbar Assessment Lumbar Assessment: Within Functional Limits Postural Control Postural Control: Deficits on evaluation Righting Reactions: delayed Protective Responses: delayed  Balance Balance Balance Assessed: Yes Static Sitting Balance Static Sitting - Balance Support: Feet supported Static Sitting - Level of Assistance: 6: Modified independent (Device/Increase time) Dynamic Sitting Balance Dynamic Sitting - Balance Support: Feet supported Dynamic Sitting - Level of Assistance: 6: Modified independent (Device/Increase time) Static Standing Balance Static Standing - Balance Support: During functional activity;Right upper extremity supported Static Standing - Level of Assistance: 6: Modified independent (Device/Increase time) Dynamic Standing Balance Dynamic Standing - Balance Support: During functional activity;Right upper extremity supported Dynamic Standing - Level of Assistance: 4: Min assist Extremity Assessment  RUE Assessment RUE Assessment: Within Functional Limits LUE Assessment LUE Assessment: Exceptions to [facility] LUE Body System: Neuro Brunstrum levels for arm and hand: Arm;Hand Brunstrum level for arm: Stage IV Movement is deviating from synergy Brunstrum level for hand: Stage III Synergies performed voluntarily LUE Strength Left Shoulder Flexion: 2-/5 Left  Shoulder Extension: 2-/5 Left Shoulder ABduction: 2-/5 Left Shoulder Internal Rotation: 2-/5 Left Shoulder External Rotation: 2-/5 Left Elbow Flexion: 2/5 Left Elbow Extension: 2/5 Left Wrist Flexion: 2/5 Left Hand Gross Grasp: Impaired LUE Tone LUE Tone: Modified Ashworth Body Part - Modified Ashworth Scale: Elbow;Wrist;Fingers;Thumb Elbow - Modified Ashworth Scale for Grading Hypertonia LUE: More marked increase in muscle tone through most of the ROM, but affected part(s) easily moved Wrist - Modified Ashworth Scale for Grading Hypertonia LUE: More marked increase in muscle tone through most of  the ROM, but affected part(s) easily moved Fingers - Modified Ashworth Scale for Grading Hypertonia LUE: More marked increase in muscle tone through most of the ROM, but affected part(s) easily moved Thumb - Modified Ashworth Scale for Grading Hypertonia LUE: More marked increase in muscle tone through most of the ROM, but affected part(s) easily moved Modified Ashworth Scale for Grading Hypertonia LUE: More marked increase in muscle tone through most of the ROM, but affected part(s) easily moved RLE Assessment RLE Assessment: Within Functional Limits LLE Assessment LLE Assessment: Exceptions to [facility] General Strength Comments: Grossly 3/5, but motor control highly impaired   [name], PT, DPT 09/21/2023, 4:45 PM

## 2023-09-21 NOTE — Telephone Encounter (Signed)
 Pharmacy Patient Advocate Encounter   Received notification from  [facility]  that prior authorization for Lidocaine 5% patches is required/requested.   Insurance verification completed.   The patient is insured through [facility] [place] .   Prior Authorization for Lidocaine 5% patches has been DENIED.  Full denial letter will be uploaded to the media tab. See denial reason below.  The following criteria from the health plan guideline, Topical Local Anesthetics, must be met before we can approve this request. Please send [place] supporting chart notes and lab results.  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  PA #/Case ID/Reference #: [id number]

## 2023-09-21 NOTE — Progress Notes (Signed)
 Occupational Therapy Discharge Summary  Patient Details  Name: Erik Santana. MRN: [id number] Date of Birth: [birth year]  Date of Discharge from OT service:September 21, 2023  Today's Date: 09/21/2023 OT Individual Time: 1310-1350 OT Individual Time Calculation (min): 40 min    Patient has met 12 of 12 long term goals due to improved activity tolerance, improved balance, postural control, ability to compensate for deficits, functional use of  LEFT upper and LEFT lower extremity, improved attention, improved awareness, and improved coordination.  Patient to discharge at overall Supervision level.  Patient's care partner is independent to provide the necessary physical assistance at discharge. [name] has made incredible progress in CIR. He began requiring max +2 and is now able to complete ADLs and stand pivot transfers with (S). His LUE has advanced quickly in the last week especially with volitional hand composite flex/ext, 75% of elbow flexion, and about 30 degrees of extension. He now has shoulder flexion to 45 degrees. His anxiety is still limiting and often affects volitional LUE control as well as his tone. His palmar reflex has improved as well.   Recommendation:  Patient will benefit from ongoing skilled OT services in outpatient setting and aquatic therapy to continue to advance functional skills in the area of BADL and iADL.  Equipment: Loft strand crutch, TTB, custom lightweight w/c  Reasons for discharge: treatment goals met and discharge from hospital  Patient/family agrees with progress made and goals achieved: Yes  Skilled OT intervention Pt received sitting in w/c with no c/o pain, agreeable to OT session. He hemi propelled w/c to the therapy gym with BLE- mod I. Stand pivot with mod I to the mat. Session focused on LUE NMR. Pt completed various functional reaching, place and hold and grasp/release activities with OT and self facilitation. He was able to hold 90 degrees of  shoulder flexion with excellent anterior deltoid activation for about 10 seconds! He grasped clothespin with moderate resistance and reached forward with OT facilitating terminal elbow extension. Provided and reviewed HEP with pt as well as self PROM. Pt returned to his room, he was left sitting up with all needs met.    OT Discharge Precautions/Restrictions  Precautions Precautions: Fall Precaution/Restrictions Comments: left tone Restrictions Weight Bearing Restrictions Per Provider Order: No ADL ADL Eating: Independent Where Assessed-Eating: Wheelchair Grooming: Modified independent Where Assessed-Grooming: Wheelchair Upper Body Bathing: Modified independent Where Assessed-Upper Body Bathing: Shower Lower Body Bathing: Supervision/safety Where Assessed-Lower Body Bathing: Shower Upper Body Dressing: Modified independent (Device) Where Assessed-Upper Body Dressing: Wheelchair Lower Body Dressing: Supervision/safety Where Assessed-Lower Body Dressing: Wheelchair Toileting: Supervision/safety Where Assessed-Toileting: Teacher, adult education: Distant supervision Statistician Method: Stand pivot Tub/Shower Transfer: Close supervison Web designer Method: Risk manager: Close supervision Film/video editor Method: Warden/ranger: Sales promotion account executive Baseline Vision/History: 0 No visual deficits Patient Visual Report: No change from baseline Vision Assessment?: No apparent visual deficits Perception  Perception: Within Functional Limits Praxis Praxis: WFL Cognition Cognition Overall Cognitive Status: Within Functional Limits for tasks assessed Arousal/Alertness: Awake/alert Orientation Level: Person;Place;Situation Person: Oriented Place: Oriented Situation: Oriented Memory: Appears intact Attention: Selective Selective Attention: Appears intact Awareness: Appears intact Problem Solving: Appears  intact Safety/Judgment: Appears intact Brief Interview for Mental Status (BIMS) Repetition of Three Words (First Attempt): 3 Temporal Orientation: Year: Correct Temporal Orientation: Month: Accurate within 5 days Temporal Orientation: Day: Correct Recall: "Sock": Yes, no cue required Recall: "Blue": Yes, no cue required Recall: "Bed": Yes, no cue required [facility] Summary  Score: 15 Sensation Sensation Light Touch: Appears Intact Proprioception: Impaired Detail Proprioception Impaired Details: Impaired LUE;Impaired LLE Coordination Gross Motor Movements are Fluid and Coordinated: No Fine Motor Movements are Fluid and Coordinated: No Coordination and Movement Description: L hemi Motor  Motor Motor: Abnormal tone;Hemiplegia;Primitive reflexes present;Abnormal postural alignment and control Motor - Discharge Observations: L hemi- much improved Mobility  Bed Mobility Bed Mobility: Supine to Sit;Sit to Supine Supine to Sit: Supervision/Verbal cueing Sit to Supine: Supervision/Verbal cueing Transfers Sit to Stand: Supervision/Verbal cueing Stand to Sit: Supervision/Verbal cueing  Trunk/Postural Assessment  Cervical Assessment Cervical Assessment: Within Functional Limits Thoracic Assessment Thoracic Assessment: Within Functional Limits Lumbar Assessment Lumbar Assessment: Within Functional Limits Postural Control Postural Control: Deficits on evaluation Righting Reactions: delayed Protective Responses: delayed  Balance Balance Balance Assessed: Yes Static Sitting Balance Static Sitting - Balance Support: Feet supported Static Sitting - Level of Assistance: 6: Modified independent (Device/Increase time) Dynamic Sitting Balance Dynamic Sitting - Balance Support: Feet supported Dynamic Sitting - Level of Assistance: 6: Modified independent (Device/Increase time) Static Standing Balance Static Standing - Balance Support: During functional activity;Right upper extremity  supported Static Standing - Level of Assistance: 6: Modified independent (Device/Increase time) Dynamic Standing Balance Dynamic Standing - Balance Support: During functional activity;Right upper extremity supported Dynamic Standing - Level of Assistance: 4: Min assist Extremity/Trunk Assessment RUE Assessment RUE Assessment: Within Functional Limits LUE Assessment LUE Assessment: Exceptions to [facility] LUE Body System: Neuro Brunstrum levels for arm and hand: Arm;Hand Brunstrum level for arm: Stage IV Movement is deviating from synergy Brunstrum level for hand: Stage III Synergies performed voluntarily LUE Strength Left Shoulder Flexion: 2-/5 Left Shoulder Extension: 2-/5 Left Shoulder ABduction: 2-/5 Left Shoulder Internal Rotation: 2-/5 Left Shoulder External Rotation: 2-/5 Left Elbow Flexion: 2/5 Left Elbow Extension: 2/5 Left Wrist Flexion: 2/5 Left Hand Gross Grasp: Impaired LUE Tone LUE Tone: Modified Ashworth Body Part - Modified Ashworth Scale: Elbow;Wrist;Fingers;Thumb Elbow - Modified Ashworth Scale for Grading Hypertonia LUE: More marked increase in muscle tone through most of the ROM, but affected part(s) easily moved Wrist - Modified Ashworth Scale for Grading Hypertonia LUE: More marked increase in muscle tone through most of the ROM, but affected part(s) easily moved Fingers - Modified Ashworth Scale for Grading Hypertonia LUE: More marked increase in muscle tone through most of the ROM, but affected part(s) easily moved Thumb - Modified Ashworth Scale for Grading Hypertonia LUE: More marked increase in muscle tone through most of the ROM, but affected part(s) easily moved Modified Ashworth Scale for Grading Hypertonia LUE: More marked increase in muscle tone through most of the ROM, but affected part(s) easily moved   Crissie Reese 09/21/2023, 10:22 AM

## 2023-09-22 DIAGNOSIS — S06339S Contusion and laceration of cerebrum, unspecified, with loss of consciousness of unspecified duration, sequela: Secondary | ICD-10-CM
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
## 2023-09-22 NOTE — Progress Notes (Signed)
 INPATIENT REHABILITATION DISCHARGE NOTE   Discharge instructions by: Jesusita Oka, PA   Verbalized understanding: Yes  Skin care/Wound care healing? yes  Pain: Medicated  IV's: none  Tubes/Drains: none  O2: none  Safety instructions: reviewed with pt and family  Patient belongings: sent with pt, including medications from [facility]  Discharged to: home  Discharged via: family transport  Notes: done  Marylu Lund, Charity fundraiser

## 2023-09-23 NOTE — Progress Notes (Signed)
 Inpatient Rehabilitation Care Coordinator Discharge Note   Patient Details  Name: Erik Santana. MRN: [id number] Date of Birth: 03/16/1991   Discharge location: D/c to home  Length of Stay: 28 days  Discharge activity level: Min Assist and mod(I) for WC mobility  Home/community participation: Limited  Patient response AO:ZHYQMV Literacy - How often do you need to have someone help you when you read instructions, pamphlets, or other written material from your doctor or pharmacy?: Rarely  Patient response HQ:IONGEX Isolation - How often do you feel lonely or isolated from those around you?: Never  Services provided included: MD, RD, PT, OT, RN, CM, TR, Pharmacy, Neuropsych, SW  Financial Services:  Field seismologist Utilized: Private Insurance [place] [employer]  Choices offered to/list presented to: Patient  Follow-up services arranged:  Outpatient, DME    Outpatient Servicies: [facility] for Pt/OT DME : [employer] for forearm crutches and TTB; [employer] [facility] w/c    Patient response to transportation need: Is the patient able to respond to transportation needs?: Yes In the past 12 months, has lack of transportation kept you from medical appointments or from getting medications?: No In the past 12 months, has lack of transportation kept you from meetings, work, or from getting things needed for daily living?: No   Patient/Family verbalized understanding of follow-up arrangements:  Yes  Individual responsible for coordination of the follow-up plan: patient  Confirmed correct DME delivered: Gretchen Short 09/23/2023    Comments (or additional information):fam edu completed  Summary of Stay    Date/Time Discharge Planning CSW  09/19/23 1105 Pt now has managed Medicaid-[employer]. Pt will discharge to home with his mother who will be primary caregiver. Pt mother works first shift; pt sister to help as she works second shift. per mother,  sister will stop working and move to 3rd shift if needed. Fam edu completed with pt mother on Friday. Specialty w/ with [employer]. SW will confirm there are no barriers to discharge. AAC  09/13/23 1450 Pt now has managed Medicaid-[employer] Medicaid. Pt will discharge to home with his mother who will be primary caregiver. Pt mother works first shift; pt sister to help as she works second shift. per mother, sister will stop working and move to 3rd shift if needed. SW will confirm there are no barriers to discharge. AAC  09/06/23 1006 Pt now has managed Medicaid-[employer] Medicaid.  Pt will discharge to home with his mother who will be primary caregiver. Pt mother works first shift; pt sister to help as she works second shift. per mother, sister will stop working and move to 3rd shift if needed. SW will confirm there are no barriers to discharge. AAC  08/30/23 1001 Pt is uninsured. Medicid application has been submitted. Pt will discharge to home with his mother who will be primary caregiver. Pt mother works first shift; pt sister to help as she works second shift. per mother, sister will stop working and move to 3rd shift if needed. Pt will be setup for [employer] medication assistance program, and charity HH (if appropriate). SW will confirm there are no barriers to discharge. AAC       Symeon Puleo A Lula Olszewski

## 2023-09-26 ENCOUNTER — Telehealth: Payer: Self-pay | Admitting: *Deleted

## 2023-09-26 NOTE — Telephone Encounter (Signed)
 Transition Care Management Unsuccessful Follow-up Telephone Call  Date of discharge and from where:  discharged from INPT Rehab unit [facility] 09/22/23  Attempts:  1st Attempt  Reason for unsuccessful TCM follow-up call:  Left voice message    Appointment 10/03/23 arrive for 1:20 appt with Jacalyn Lefevre NP then to Dr Shearon Stalls thereafter [address]

## 2023-10-03 ENCOUNTER — Encounter: Payer: Self-pay | Admitting: Registered Nurse

## 2023-10-05 ENCOUNTER — Encounter: Attending: Physical Medicine and Rehabilitation | Admitting: Physical Medicine and Rehabilitation

## 2023-10-05 VITALS — BP 119/78 | HR 80 | Ht 74.0 in | Wt 188.0 lb

## 2023-10-05 DIAGNOSIS — R4586 Emotional lability: Secondary | ICD-10-CM | POA: Diagnosis present

## 2023-10-05 DIAGNOSIS — M21372 Foot drop, left foot: Secondary | ICD-10-CM | POA: Diagnosis not present

## 2023-10-05 DIAGNOSIS — I629 Nontraumatic intracranial hemorrhage, unspecified: Secondary | ICD-10-CM | POA: Diagnosis present

## 2023-10-05 DIAGNOSIS — F411 Generalized anxiety disorder: Secondary | ICD-10-CM | POA: Insufficient documentation

## 2023-10-05 MED ORDER — BUSPIRONE HCL 10 MG PO TABS: 15.0000 mg | ORAL_TABLET | Freq: Three times a day (TID) | ORAL | 3 refills | Status: DC

## 2023-10-05 MED ORDER — TIZANIDINE HCL 4 MG PO TABS: 4.0000 mg | ORAL_TABLET | Freq: Three times a day (TID) | ORAL | 3 refills | Status: DC | PRN

## 2023-10-05 MED ORDER — HYDROXYZINE HCL 25 MG PO TABS: 25.0000 mg | ORAL_TABLET | Freq: Three times a day (TID) | ORAL | 3 refills | Status: DC | PRN

## 2023-10-05 MED ORDER — MECLIZINE HCL 25 MG PO TABS: 25.0000 mg | ORAL_TABLET | Freq: Three times a day (TID) | ORAL | 3 refills | Status: AC | PRN

## 2023-10-05 MED ORDER — TRAZODONE HCL 100 MG PO TABS
50.0000 mg | ORAL_TABLET | Freq: Every evening | ORAL | 3 refills | Status: DC | PRN
Start: 2023-10-05 — End: 2023-12-07

## 2023-10-05 MED ORDER — BACLOFEN 10 MG PO TABS: 15.0000 mg | ORAL_TABLET | Freq: Three times a day (TID) | ORAL | 3 refills | Status: DC

## 2023-10-05 MED ORDER — LIDOCAINE 5 % EX PTCH: 1.0000 | MEDICATED_PATCH | Freq: Two times a day (BID) | CUTANEOUS | 3 refills | Status: DC | PRN

## 2023-10-05 MED ORDER — SCOPOLAMINE 1 MG/3DAYS TD PT72: 1.0000 | MEDICATED_PATCH | TRANSDERMAL | 3 refills | Status: DC

## 2023-10-05 MED ORDER — DIVALPROEX SODIUM ER 500 MG PO TB24: 500.0000 mg | ORAL_TABLET | Freq: Every day | ORAL | 3 refills | Status: DC

## 2023-10-05 NOTE — Progress Notes (Signed)
 Subjective:    Patient ID: Erik Pollard., male    DOB: 1990/09/06, 33 y.o.   MRN: [id number]  HPI  Erik Zatarain. is a 33 y.o. 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   Interval Hx:  - Therapies: Starting up end of April; he is doing his HEP. He gets more tired and unstable toward the end of the day when he gets tired, gets more myoclonus and ankle rolls in. He says if he controls his breathing better, he does better.    - Follow ups: End of may he has follow up with PCP and Neurology. He has an appointment coming up 5/4 with ?BH, mom is unsure.    - Falls: none   - DME: Using single point cane for transfers and a wheelchair for most ambulation; he doesn't have flat roads where he lives so he has to use the cane for that. Using AFO, no concerns for pressure or skin breakdown - still having a lot of inversion tone with it.    - Medications: No medication changes since discharge. He uses tramadol intermittently to relax his leg when he gets hard spasms. No as much dizziness, hasn't needed the antivert much but the patch helps quite a bit. He says his sleep is getting better - he struggles going to sleep but sleeps through the night. Usually takes it around 9-10. 1   - Other concerns: He is having extreme anxiety still, "in his head" per his mom. BH referral made at discharge, pending appt. He had a lot of anxiety before his SDH, but now he finds frustration about his disability causes him to be more aggressive. No violence. No SI or HI. Does not feel like his symptoms are well controlled. He is using atarax often, with benefit.    Pain Inventory Average Pain 5 Pain Right Now 6 My pain is  irritating  LOCATION OF PAIN   back, hip, knee, leg, ankle, toes  BOWEL Number of stools per week: 7 Oral laxative use No  Type of laxative . Enema or suppository use No  History of colostomy No  Incontinent No   BLADDER Normal In and out cath, frequency . Able to self cath No  Bladder incontinence No  Frequent urination No  Leakage with coughing No  Difficulty starting stream No  Incomplete bladder emptying No    Mobility walk with assistance use a cane ability to climb steps?  no do you drive?  no use a wheelchair transfers alone  Function not employed: date last employed . I need assistance with the following:  bathing, meal prep, household duties, and shopping  Neuro/Psych weakness tremor trouble walking spasms dizziness depression anxiety  Prior Studies TC appt  Physicians involved in your care TC appt   Family History  Problem Relation Age of Onset   Healthy Mother    Healthy Father    Social History   Socioeconomic History   Marital status: Single    Spouse name: Not on file   Number of children: Not on file   Years of education: Not on file   Highest education level: Not on file  Occupational History   Not on file  Tobacco Use   Smoking status:  Every Day    Current packs/day: 0.50    Types: Cigarettes   Smokeless tobacco: Never  Vaping Use   Vaping status: Some Days  Substance and Sexual Activity   Alcohol use: Yes    Comment: occ   Drug use: No   Sexual activity: Not on file  Other Topics Concern   Not on file  Social History Narrative   Not on file   Social Drivers of Health   Financial Resource Strain: Not on file  Food Insecurity: Not on file  Transportation Needs: Not on file  Physical Activity: Not on file  Stress: Not on file  Social Connections: Not on file   Past Surgical History:  Procedure Laterality Date   ESOPHAGOGASTRODUODENOSCOPY N/A 10/18/2012   Procedure: ESOPHAGOGASTRODUODENOSCOPY (EGD);  Surgeon: Claudette Cue, MD;  Location:  [facility];  Service: Endoscopy;  Laterality: N/A;   ESOPHAGOGASTRODUODENOSCOPY (EGD) WITH PROPOFOL N/A 10/18/2012   Procedure: ESOPHAGOGASTRODUODENOSCOPY (EGD) WITH PROPOFOL;  Surgeon: Claudette Cue, MD;  Location: [facility];  Service: Endoscopy;  Laterality: N/A;   FINGER SURGERY     IR ANGIO INTRA EXTRACRAN SEL COM CAROTID INNOMINATE BILAT MOD SED  08/23/2023   IR ANGIO VERTEBRAL SEL SUBCLAVIAN INNOMINATE UNI L MOD SED  08/23/2023   IR ANGIO VERTEBRAL SEL VERTEBRAL UNI R MOD SED  08/23/2023   OPEN REDUCTION INTERNAL FIXATION (ORIF) PROXIMAL PHALANX Right 10/12/2022   Procedure: Right ring finger middle phalanx and proximal interphalangeal joint closed reduction internal percutaneous pinning;  Surgeon: Ltanya Rummer, MD;  Location: [facility];  Service: Orthopedics;  Laterality: Right;   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   BP 119/78   Pulse 80   Ht 6\' 2"  (1.88 m)   Wt 188 lb (85.3 kg)   SpO2 96%   BMI 24.14 kg/m   Opioid Risk Score:   Fall Risk Score:  `1  Depression screen PHQ 2/9      No data to display            Review of Systems  Musculoskeletal:  Positive for gait problem.       Spasms  Neurological:  Positive for dizziness, tremors and weakness.  Psychiatric/Behavioral:  Positive for dysphoric mood. The patient is nervous/anxious.   All other systems reviewed and are negative.     Objective:   Physical Exam  PE: Constitution: Appropriate appearance for age. No apparent distress   Resp: No respiratory distress. No accessory muscle usage. on RA and CTAB Cardio: Well perfused appearance. No peripheral edema. Abdomen: Nondistended. Nontender.   Psych: Appropriate mood and affect. Neuro: AAOx4. No apparent cognitive deficits   Neurologic Exam:   DTRs: Reflexes were 2+ in bilateral achilles, patella,  biceps, BR and triceps. Babinsky: flexor responses b/l.   Hoffmans: negative b/l Sensory exam: revealed normal sensation in all dermatomal regions in bilateral upper extremities and bilateral lower extremities  LUE: 4+/5 , full AROM, mild intention tremor, fine motor intact  LLE: + sustained clonus. Hyperreflexic. LLE strength 4/5, KE 3/5, KF 4/5, DF 1/5, EHL 1/5, PF 2/5; Mas 2 PF, MAS 2 hamstrings, MAS 2 ankle inverters      Assessment & Plan:  Erik Santana. is a 33 y.o. year old male  who  has a past medical history of ADD (attention deficit disorder), Anxiety, Asthma,  GERD (gastroesophageal reflux disease), and Mallory-Weiss tear (09/26/2012).    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  Non-traumatic intracranial hemorrhage (HCC) Follow up with neurology and your family doctor as scheduled. Attend your therapies.  Continue Baclofen 15 mg three times daily; reduce tizanidine to three times daily as needed for spasms. If your spasms improve in the next month or so such that you no longer need tizanidine, we can start to reduce the baclofen as well.   Stop tramadol; use tylenol up to 1000 mg three times daily and ibuprofen up to 600 mg once daily for pain. Also continue lidoderm patches.   Follow up in 2 months; we are likely to get labwork at this time. We can also discuss repeat botox at this time if needed.   Anxiety state Emotional lability For anxiety, increase Buspar to 15 mg three times daily; continue depakote 500 mg daily and trazodone decrease as tolerated; can take 50 to 100 mg nightly as needed. I recommend taking trazodone closer to an hour before bedtime, around 8 oclock.   Make sure you have a behavioral health appointment; if not, message me and we will send out the referral again.   Left foot drop I am giving a script for a new AFO to provide more ankle support; continue bracing  your foot at night. You can stop bracing your arm.   Other orders -     Baclofen; Take 1.5 tablets (15 mg total) by mouth 3 (three) times daily.  Dispense: 90 tablet; Refill: 3 -     busPIRone HCl; Take 1.5 tablets (15 mg total) by mouth 3 (three) times daily.  Dispense: 135 tablet; Refill: 3 -     Divalproex Sodium ER; Take 1 tablet (500 mg total) by mouth daily.  Dispense: 30 tablet; Refill: 3 -     hydrOXYzine HCl; Take 1 tablet (25 mg total) by mouth 3 (three) times daily as needed for anxiety.  Dispense: 30 tablet; Refill: 3 -     Meclizine HCl; Take 1 tablet (25 mg total) by mouth 3 (three) times daily as needed for dizziness.  Dispense: 30 tablet; Refill: 3 -     Scopolamine; Place 1 patch (1.5 mg total) onto the skin every 3 (three) days.  Dispense: 10 patch; Refill: 3 -     tiZANidine HCl; Take 1 tablet (4 mg total) by mouth every 8 (eight) hours as needed for muscle spasms.  Dispense: 90 tablet; Refill: 3 -     traZODone HCl; Take 0.5-1 tablets (50-100 mg total) by mouth at bedtime as needed for sleep.  Dispense: 30 tablet; Refill: 3 -     Lidocaine; Place 1 patch onto the skin every 12 (twelve) hours as needed. Remove & Discard patch within 12 hours or as directed by MD  Dispense: 30 patch; Refill: 3

## 2023-10-05 NOTE — Patient Instructions (Addendum)
 For anxiety, increase Buspar to 15 mg three times daily; continue depakote 500 mg daily and trazodone decrease as tolerated; can take 50 to 100 mg nightly as needed. I recommend taking trazodone closer to an hour before bedtime, around 8 oclock.   Make sure you have a behavioral health appointment; if not, message me and we will send out the referral again.   Follow up with neurology and your family doctor as scheduled. Attend your therapies.  Continue Baclofen 15 mg three times daily; reduce tizanidine to three times daily as needed for spasms. If your spasms improve in the next month or so such that you no longer need tizanidine, we can start to reduce the baclofen as well.   Stop tramadol; use tylenol up to 1000 mg three times daily and ibuprofen up to 600 mg once daily for pain. Also continue lidoderm patches.   I am giving a script for a new AFO to provide more ankle support; continue bracing your foot at night. You can stop bracing your arm.   Follow up in 2 months; we are likely to get labwork at this time. We can also discuss repeat botox at this time if needed.

## 2023-10-11 ENCOUNTER — Telehealth: Payer: Self-pay

## 2023-10-11 NOTE — Telephone Encounter (Signed)
 PA RE-SUBMITTED FOR LIDOCAINE PATCH. First attempt denied.

## 2023-10-20 ENCOUNTER — Telehealth: Payer: Self-pay | Admitting: Physical Medicine and Rehabilitation

## 2023-10-20 NOTE — Telephone Encounter (Signed)
 Patient would like to discuss medication Depakote .  Please call patient.

## 2023-10-21 ENCOUNTER — Encounter: Payer: Self-pay | Admitting: Occupational Therapy

## 2023-10-21 ENCOUNTER — Other Ambulatory Visit (HOSPITAL_COMMUNITY): Payer: Self-pay

## 2023-10-21 ENCOUNTER — Encounter: Payer: Self-pay | Admitting: Physical Therapy

## 2023-10-21 ENCOUNTER — Other Ambulatory Visit: Payer: Self-pay

## 2023-10-21 ENCOUNTER — Ambulatory Visit: Admitting: Occupational Therapy

## 2023-10-21 ENCOUNTER — Ambulatory Visit: Attending: Physician Assistant | Admitting: Physical Therapy

## 2023-10-21 VITALS — BP 128/81 | HR 75

## 2023-10-21 DIAGNOSIS — M6281 Muscle weakness (generalized): Secondary | ICD-10-CM

## 2023-10-21 DIAGNOSIS — R29898 Other symptoms and signs involving the musculoskeletal system: Secondary | ICD-10-CM | POA: Insufficient documentation

## 2023-10-21 DIAGNOSIS — R2689 Other abnormalities of gait and mobility: Secondary | ICD-10-CM | POA: Diagnosis present

## 2023-10-21 DIAGNOSIS — R269 Unspecified abnormalities of gait and mobility: Secondary | ICD-10-CM | POA: Insufficient documentation

## 2023-10-21 DIAGNOSIS — R29818 Other symptoms and signs involving the nervous system: Secondary | ICD-10-CM | POA: Diagnosis present

## 2023-10-21 DIAGNOSIS — R2681 Unsteadiness on feet: Secondary | ICD-10-CM | POA: Diagnosis present

## 2023-10-21 DIAGNOSIS — R278 Other lack of coordination: Secondary | ICD-10-CM

## 2023-10-21 MED ORDER — DIVALPROEX SODIUM ER 250 MG PO TB24: 250.0000 mg | ORAL_TABLET | Freq: Every day | ORAL | 0 refills | Status: DC

## 2023-10-21 MED ORDER — TRAMADOL HCL 50 MG PO TABS: 50.0000 mg | ORAL_TABLET | Freq: Two times a day (BID) | ORAL | 1 refills | Status: DC | PRN

## 2023-10-21 NOTE — Therapy (Signed)
 OUTPATIENT PHYSICAL THERAPY NEURO EVALUATION   Patient Name: Erik Santana. MRN: [id number] DOB:1990/08/08, 33 y.o., male 45 Date: 10/21/2023   PCP: Lavona Pounds, NP REFERRING PROVIDER: Sterling Eisenmenger, PA-C  END OF SESSION:  PT End of Session - 10/21/23 1107     Visit Number 1    Number of Visits 11    Date for PT Re-Evaluation 12/16/23    Authorization Type [place] [employer]    PT Start Time 1102    PT Stop Time 1145    PT Time Calculation (min) 43 min    Equipment Utilized During Treatment Gait belt    Activity Tolerance Patient tolerated treatment well    Behavior During Therapy WFL for tasks assessed/performed             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   Past Surgical History:  Procedure Laterality Date   ESOPHAGOGASTRODUODENOSCOPY N/A 10/18/2012   Procedure: ESOPHAGOGASTRODUODENOSCOPY (EGD);  Surgeon: Claudette Cue, MD;  Location: [facility];  Service: Endoscopy;  Laterality: N/A;   ESOPHAGOGASTRODUODENOSCOPY (EGD) WITH PROPOFOL  N/A 10/18/2012   Procedure: ESOPHAGOGASTRODUODENOSCOPY (EGD) WITH PROPOFOL ;  Surgeon: Claudette Cue, MD;  Location: [facility];  Service: Endoscopy;  Laterality: N/A;   FINGER SURGERY     IR ANGIO INTRA EXTRACRAN SEL COM CAROTID INNOMINATE BILAT MOD SED  08/23/2023   IR ANGIO VERTEBRAL SEL SUBCLAVIAN INNOMINATE UNI L MOD SED  08/23/2023   IR ANGIO VERTEBRAL SEL VERTEBRAL UNI R MOD SED  08/23/2023   OPEN REDUCTION INTERNAL FIXATION (ORIF) PROXIMAL PHALANX Right 10/12/2022   Procedure: Right ring finger middle phalanx and proximal interphalangeal joint closed reduction internal percutaneous pinning;  Surgeon: Ltanya Rummer, MD;  Location: [facility];  Service: Orthopedics;  Laterality: Right;   Patient Active Problem List   Diagnosis Date Noted   Emotional lability 10/05/2023    Left foot drop 10/05/2023   Cognitive change 08/30/2023   Intraparenchymal hematoma of brain (HCC) 08/24/2023   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   Anxiety state 10/27/2012   Hematemesis 10/18/2012   Other esophagitis 10/18/2012   Mallory-Weiss tear 10/18/2012    ONSET DATE: 09/22/2023 (referral date)  REFERRING DIAG: S06.33AA (ICD-10-CM) - Intraparenchymal hematoma of brain due to trauma [facility])  THERAPY DIAG:  Abnormality of gait and mobility  Unsteadiness on feet  Muscle weakness (generalized)  Rationale for Evaluation and Treatment: Rehabilitation  SUBJECTIVE:  SUBJECTIVE STATEMENT: Patient arrives to session ambulating with single lofstrand crutch in R arm, L AFO townsend thusane brace, and mother pushing loaner manual wheelchair (patient not using). 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 Patient reports that since his injury his biggest challenges are agitation and some increased anxiety; he reports it causes increased muscle tightness and occassional shaking. Mom reports agitation is very mild and patient doesn't tend to get angry. Patient also reports that he was started on depakote  which patient reports since starting is causing hair to fall out in clumps and drying out skin. Patient medical team aware and just decreased dose from 500 to 250. Patient reports for mobility he is currently with loaner manual wheelchair and R lofstrand crutch. He reports he is using the crutch about 25% of the time and manual chair 75% of the time. Patient reports that he has received botox in his hamstring muscles primarily laterally which has helped with the tightness in  his legs. 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   Pt accompanied by: self  PERTINENT HISTORY: parenchymal hemorrhage centered at the right frontal parietal junction, intraparenchymal hematoma within the superior right frontal lobe, seizures   PAIN:  Are you having pain? 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 Yes. 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  DIAGNOSTIC FINDINGS:   MR 08/22/2023: IMPRESSION: Unchanged appearance of intraparenchymal hematoma within the superior right frontal lobe. No mass lesion.  COGNITION: Overall cognitive status: Within functional limits for tasks assessed   SENSATION: Reports reduced sensation on LLE  COORDINATION: WFL for UE, limited testing due to   EDEMA:  Reports intermittent swelling in L ankle, 1+ edema today   MUSCLE TONE: LLE: Moderate and Modifed Ashworth Scale 2 = More marked  increase in muscle tone through most of the ROM, but affected part(s) easily moved, LUE - ROM near [facility] but with more challenging tasks falls into mild flexor pattern  POSTURE: No Significant postural limitations  LOWER EXTREMITY ROM:     Active  Right Eval Left Eval  Hip flexion [facility]  Hip extension    Hip abduction WFL 10 degrees past neutral, limited by synergy   Hip adduction  10 degrees past neutral active  Hip  internal rotation  5 degrees past neutral active  Hip external rotation    Knee flexion WFL 95 degrees flexion  Knee extension WFL  Very limited due to hamstring tightness only 10 active degrees of active isolated extension but more functional in standing   Ankle dorsiflexion WFL 5 degrees   Ankle plantarflexion WFL Difficulty returning back to plantarflexion once dorsiflexes, limited testing by L AFO  Ankle inversion    Ankle eversion     (Blank rows = not tested)  LOWER EXTREMITY MMT:    MMT Right Eval Left Eval  Hip flexion WFL 4-  Hip extension    Hip abduction WFL 2-/5  Hip adduction [facility] 2-/5  Hip internal rotation    Hip external rotation    Knee flexion WFL 2-/5  Knee extension WFL 2-/5  Ankle dorsiflexion WFL 2-/5  Ankle plantarflexion WFL   Ankle inversion    Ankle eversion    (Blank rows = not tested)  Difficult to assess due to degree of LE spasticity, patient with challenges with knee extension, abduction, and adduction, plantarflexion, and dorsiflexion, hamstring notably spastic during session, with gait tends to fall into inversion of ankle and difficulty achieving terminal stance with ~10 degrees of knee flexion in stance  Botox: see subjective for more details BED MOBILITY:  Reports having some challenges with bed mobility but is doing it by himself (to be assessed)   TRANSFERS: Sit to stand: Modified independence  Assistive device utilized: R lofstrand, L AFO     Stand to sit: Modified independence  Assistive device utilized: R  lofstrand, L AFO     Chair to chair: Modified independence  Assistive device utilized: R lofstrand, L AFO       GAIT: Findings: Gait Characteristics: decreased stance time- Left, decreased hip/knee flexion- Left, decreased ankle dorsiflexion- Left, circumduction- Left, knee flexed in stance- Left, and wide BOS, Distance walked: various clinic distances, Assistive device utilized:R lofstrand, L AFO, Level of assistance: SBA, and Comments: limited by hamstring spasticity with step length and increased tendency towards ankle inversion with patient pausing intermittently to correct   FUNCTIONAL TESTS:    [facility] PT Assessment - 10/21/23 0001       Standardized Balance Assessment   Standardized Balance Assessment Five Times Sit to Stand;10 meter walk test    Five times sit to stand comments  18.97   seconds without UE use (modI)   10 Meter Walk 0.34 m/s   seconds without R Lofstrand crutch and L AFO (SBA)            VITALS:  Vitals:   10/21/23 1139  BP: 128/81  Pulse: 75                                                                                                 TREATMENT:    Initial Eval only  PATIENT EDUCATION: Education details: POC, goal collaboration, examination findings, Patient asking about additional botox as doctor had discussed trying for medial hamstrings (PT recommend further assessment of gait and discussed balance of trying to maximize both ROM/movement, managing spacticity but also  not at expense of function, discussed 27 visit limit for year and mentioned briefly [facility], discussed 6 month window prioritizing return to function following CVA  Person educated: Patient Education method: Explanation Education comprehension: verbalized understanding and needs further education  HOME EXERCISE PROGRAM: To be provided   GOALS: Goals reviewed with patient? Yes  SHORT TERM GOALS: Target date: .WPLUS  Patient will report demonstrate independence with initial  HEP in order to maintain current gains and continue to progress after physical therapy discharge.   Baseline: To be provided  Goal status: INITIAL  2.  to be assessed / LTG written Baseline: To be assessed  Goal status: INITIAL   LONG TERM GOALS: Target date: 12/16/2023  Patient will report demonstrate independence with final HEP in order to maintain current gains and continue to progress after physical therapy discharge.   Baseline: To be provided  Goal status: INITIAL  2.  Patient will improve gait speed to 0.5 m/s or greater to indicate an improvement from being a household ambulator to limited community ambulator.   Baseline: 0.34 m/s with R lofstrand crutch and L AFO (SBA) Goal status: INITIAL  3.  Patient will improve their 5x Sit to Stand score to less than 15 seconds to demonstrate a decreased risk for falls and improved LE strength.   Baseline: 18.97 seconds without UE use Goal status: INITIAL  4.  to be assessed / LTG to be written Baseline: To be assessed  Goal status: INITIAL  ASSESSMENT:  CLINICAL IMPRESSION: Patient is a 33 y.o. male who was seen today for physical therapy evaluation and treatment for parenchymal hemorrhage centered at the right frontal parietal junction and intraparenchymal hematoma within the superior right frontal lobe on 08/24/2023. Patient currently using a mix of L AFO and R Lofstrand crutch 25% and loaner manual chair ~75% for mobility (to receive custom manual chair). Patient demonstrates L hemibody but notably more impaired in LLE than UE. Patient limited by spasticity 2 on Modified Ashworth of L hamstring and weak quad strength on L side resulting in increased flexion in stance and mild circumduction of L leg as presents with difficulty with knee flexion through swing. Patient also limited by increased inversion of L ankle. Patient reports challenges with emotional lability (mild agitation/anxiety) that occasional causes shaking but  reports is improving and demonstrates behavior [facility] throughout session. Patient presents with gait speed indicating limited to household ambulation (limited by spasticity) and is an increased risk for falls as indicated by gait speed and five time sit to stand. Patient with benefit from skilled physical therapy to maximize mobility, function, and independence.   OBJECTIVE IMPAIRMENTS: Abnormal gait, decreased activity tolerance, decreased balance, decreased endurance, decreased mobility, difficulty walking, decreased ROM, decreased strength, increased fascial restrictions, impaired flexibility, impaired sensation, impaired tone, and pain.   ACTIVITY LIMITATIONS: carrying, standing, transfers, and locomotion level  PARTICIPATION LIMITATIONS: driving, community activity, occupation, and yard work  PERSONAL FACTORS: Age, Time since onset of injury/illness/exacerbation, Transportation, and 1-2 comorbidities: see above  are also affecting patient's functional outcome.   REHAB POTENTIAL: Good  CLINICAL DECISION MAKING: Evolving/moderate complexity  EVALUATION COMPLEXITY: Moderate  PLAN:  PT FREQUENCY/DURATION: 2x/week for 4 weeks and then 1x week for 2 weeks   PLANNED INTERVENTIONS: [id number]- PT Re-evaluation, 97110-Therapeutic exercises, 97530- Therapeutic activity, [id number]- Neuromuscular re-education, 97535- Self Care, [id number]- Manual therapy, [id number]- Gait training, [id number]- Orthotic/Prosthetic subsequent, and [id number]- Aquatic Therapy  PLAN FOR NEXT SESSION:   Assess and write LTG,  stretching program with emphasis on hamstring tightness, quad strengthening as able, could consider trial of Bioness, high intensity gait training, continue conversation about additional botox (recommended vs not pending gait), L NMR re-ed  [facility] getting [facility] early [facility], per chart review patient supposed to see neuro psych for emotional lability - follow up on this as needed    *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     Check all conditions that are expected to impact treatment: {Conditions expected to impact treatment:Contractures, spasticity or fracture relevant to requested treatment and Neurological condition and/or seizures   If treatment provided at initial evaluation, no treatment charged due to lack of authorization.      Coreen Devoid, PT, DPT 10/21/2023, 12:48 PM

## 2023-10-21 NOTE — Telephone Encounter (Signed)
 Return Erik Santana call,  he reports hair loss with Depakote .  He has been on Depakote  or the last few months.  He also reports left side  with tingling and burning , when laying on his right side. His depakote  will be decreased to Depakote  250 mg, he was  educated Depakote  has to be weaned slowly, he verbalizes understanding. He was instructed to call Dr Dorn Gaskins on Monday morning, he verbalizes understanding.  Dr Dorn Gaskins note was reviewed, his Tramadol  was discontinued. Erik Santana states he was taking Tramadol  when pain was severe,  when he was receiving no relief with Tylenol  and Ibuprofen.  Tramadol  will be e-scribed , one tablet twice a day as needed for 4 days, this will be addressed with Dr Dorn Gaskins on Monday, he verbalizes understanding.

## 2023-10-21 NOTE — Therapy (Signed)
 OUTPATIENT OCCUPATIONAL THERAPY NEURO EVALUATION  Patient Name: Erik Santana. MRN: [id number] DOB:December 06, 1990, 33 y.o., male 69 Date: 10/21/2023  PCP: Erik Pounds, NP  REFERRING PROVIDER: Sterling Eisenmenger, PA-C  END OF SESSION:  OT End of Session - 10/21/23 1150     Visit Number 1    Number of Visits 7    Date for OT Re-Evaluation 12/02/23    Authorization Type [place] Medicaid    OT Start Time 1149    OT Stop Time 1230    OT Time Calculation (min) 41 min    Activity Tolerance Patient tolerated treatment well    Behavior During Therapy [facility] for tasks assessed/performed            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   Past Surgical History:  Procedure Laterality Date   ESOPHAGOGASTRODUODENOSCOPY N/A 10/18/2012   Procedure: ESOPHAGOGASTRODUODENOSCOPY (EGD);  Surgeon: Erik Cue, MD;  Location: [facility];  Service: Endoscopy;  Laterality: N/A;   ESOPHAGOGASTRODUODENOSCOPY (EGD) WITH PROPOFOL  N/A 10/18/2012   Procedure: ESOPHAGOGASTRODUODENOSCOPY (EGD) WITH PROPOFOL ;  Surgeon: Erik Cue, MD;  Location: [facility];  Service: Endoscopy;  Laterality: N/A;   FINGER SURGERY     IR ANGIO INTRA EXTRACRAN SEL COM CAROTID INNOMINATE BILAT MOD SED  08/23/2023   IR ANGIO VERTEBRAL SEL SUBCLAVIAN INNOMINATE UNI L MOD SED  08/23/2023   IR ANGIO VERTEBRAL SEL VERTEBRAL UNI R MOD SED  08/23/2023   OPEN REDUCTION INTERNAL FIXATION (ORIF) PROXIMAL PHALANX Right 10/12/2022   Procedure: Right ring finger middle phalanx and proximal interphalangeal joint closed reduction internal percutaneous pinning;  Surgeon: Erik Rummer, MD;  Location: [facility];  Service: Orthopedics;  Laterality: Right;   Patient Active Problem List   Diagnosis Date Noted   Emotional lability 10/05/2023   Left foot drop 10/05/2023   Cognitive change  08/30/2023   Intraparenchymal hematoma of brain (HCC) 08/24/2023   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   Anxiety state 10/27/2012   Hematemesis 10/18/2012   Other esophagitis 10/18/2012   Mallory-Weiss tear 10/18/2012    ONSET DATE: 09/22/2023 (Date of referral)  REFERRING DIAG: S06.33AA (ICD-10-CM) - Contusion and laceration of cerebrum, unspecified, with loss of consciousness status unknown, initial encounter  THERAPY DIAG:  Other lack of coordination  Muscle weakness (generalized)  Other symptoms and signs involving the nervous system  Other symptoms and signs involving the musculoskeletal system  Rationale for Evaluation and Treatment: Rehabilitation  SUBJECTIVE:   SUBJECTIVE STATEMENT: He was living with his 45 y.o. grandfather prior to CVA. Reports he plays a lot of video games and attributes playing these after his his accident to initiating return to LUE ROM.   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 Yes: NPRS scale: 6-7/10 Pain location: hip and elbow on L side and primarily in joints Pain description: burning, tingling Aggravating factors: late in the day and laying on the R side Relieving factors:  positioning  FALLS: Has patient fallen in last 6 months? Yes. Number of falls 1  LIVING ENVIRONMENT: Lives with: lives with their family - mother (new since CVA) Lives in: House/apartment Stairs: Yes: External: 3 steps; none and reports that they are wide Has following equipment at home: Wheelchair (manual), shower  chair, and [place] crutch  PLOF: Independent; working in [employer]; driving  PATIENT GOALS: return to PLOF  OBJECTIVE:  Note: Objective measures were completed at Evaluation unless otherwise noted.  HAND DOMINANCE: Right  ADLs: Overall ADLs: mod I  Equipment: Shower seat with back  IADLs: Shopping: mod A Light housekeeping: mod I Meal Prep: mod I Community mobility: dependent Medication management: mod I Financial management: mod I Handwriting:  no change  MOBILITY STATUS: Needs Assist: SBA with [place]  ACTIVITY TOLERANCE: Activity tolerance: fair  FUNCTIONAL OUTCOME MEASURES: Quick Dash: 27.3 %   UPPER EXTREMITY ROM:    BUE: WFL, bradykinesia on L side through certain planes  UPPER EXTREMITY MMT:     BUE: WFL though LUE demonstrates poor endurance and not balance with RUE  HAND FUNCTION: Grip strength: Right: 162.4 lbs; Left: 105.3 lbs  COORDINATION: 9 Hole Peg test: Right: 23 sec; Left: 36 sec  SENSATION: Paresthesias reported in LUE elbow and shoulder  EDEMA: none reported or observed  MUSCLE TONE: NT  COGNITION: Overall cognitive status: Within functional limits for tasks assessed  VISION: Subjective report: no change, no glasses  VISION ASSESSMENT: WFL  PERCEPTION: WFL  PRAXIS: WFL  OBSERVATIONS: Pt appears well-kept. Ambulates with increased time and SBA using [place]. Tremulous LUE movements.                                                                                                                            TREATMENT :    N/A for this visit  PATIENT EDUCATION: Education details: OT Role and POC Person educated: Patient and Parent Education method: Explanation Education comprehension: verbalized understanding  HOME EXERCISE PROGRAM: N/A for this visit  GOALS:  SHORT TERM GOALS: Target date: 11/18/2023    Patient will demonstrate initial L UE HEP with 25% verbal cues or less for proper  execution. Baseline: Goal status: INITIAL  2.  Pt will independently recall the 5 main sensory precautions (cold, heat, sharp, chemical, and heavy) as needed to prevent injury/harm secondary to impairments.   Baseline:  Goal status: INITIAL  LONG TERM GOALS: Target date: 12/02/2023    Patient will demonstrate updated L UE HEP with visual handouts only for proper execution. Baseline:  Goal status: INITIAL  2.  Pt will report no more than mild difficulty tolerating force or impact through LUE.  Baseline: severe difficulty Goal status: INITIAL  3.  Pt will demonstrate equal strength and endurance with BUE MMT.  Baseline: WFL though LUE demonstrates poor endurance and not balance with RUE Goal status: INITIAL  ASSESSMENT:  CLINICAL IMPRESSION: Patient is a 33 y.o. male who was seen today for occupational therapy  evaluation for CVA. Hx includes parenchymal hemorrhage centered at the right frontal parietal junction, intraparenchymal hematoma within the superior right frontal lobe, and seizures. Patient currently presents below baseline level of functioning demonstrating functional deficits and impairments as noted below. Pt would benefit from skilled OT services in the outpatient setting to work on impairments as noted below to help pt return to PLOF as able.    PERFORMANCE DEFICITS: in functional skills including ADLs, IADLs, coordination, proprioception, sensation, strength, pain, Fine motor control, Gross motor control, mobility, endurance, decreased knowledge of precautions, and UE functional use.   IMPAIRMENTS: are limiting patient from ADLs, IADLs, rest and sleep, work, leisure, and social participation.   CO-MORBIDITIES: may have co-morbidities  that affects occupational performance. Patient will benefit from skilled OT to address above impairments and improve overall function.  MODIFICATION OR ASSISTANCE TO COMPLETE EVALUATION: Min-Moderate modification of tasks or assist with  assess necessary to complete an evaluation.  OT OCCUPATIONAL PROFILE AND HISTORY: Detailed assessment: Review of records and additional review of physical, cognitive, psychosocial history related to current functional performance.  CLINICAL DECISION MAKING: Moderate - several treatment options, min-mod task modification necessary  REHAB POTENTIAL: Good  EVALUATION COMPLEXITY: Moderate    PLAN:  OT FREQUENCY: 1x/week  OT DURATION: 6 weeks  PLANNED INTERVENTIONS: 97168 OT Re-evaluation, 97535 self care/ADL training, 86578 therapeutic exercise, 97530 therapeutic activity, 97112 neuromuscular re-education, 97140 manual therapy, 97113 aquatic therapy, 97035 ultrasound, 97018 paraffin, 46962 fluidotherapy, 97010 moist heat, 97032 electrical stimulation (manual), 97760 Orthotic Initial, 97763 Orthotic/Prosthetic subsequent, functional mobility training, energy conservation, coping strategies training, patient/family education, and DME and/or AE instructions  RECOMMENDED OTHER SERVICES: N/A for this visit  CONSULTED AND AGREED WITH PLAN OF CARE: Patient and family member/caregiver  PLAN FOR NEXT SESSION: initiate LUE blue putty and theraband HEP   Altamease Asters, OT 10/21/2023, 2:58 PM   For all possible CPT codes, reference the Planned Interventions line above.     Check all conditions that are expected to impact treatment: {Conditions expected to impact treatment:Musculoskeletal disorders, Contractures, spasticity or fracture relevant to requested treatment, and Neurological condition and/or seizures   If treatment provided at initial evaluation, no treatment charged due to lack of authorization.

## 2023-10-24 NOTE — Therapy (Signed)
 OUTPATIENT OCCUPATIONAL THERAPY NEURO TREATMENT  Patient Name: Erik Santana. MRN: [id number] DOB:29-May-1991, 33 y.o., male 38 Date: 10/25/2023  PCP: Lavona Pounds, NP  REFERRING PROVIDER: Sterling Eisenmenger, PA-C  END OF SESSION:  OT End of Session - 10/25/23 1454     Visit Number 2    Number of Visits 7    Date for OT Re-Evaluation 12/02/23    Authorization Type [place] Medicaid    OT Start Time 1453    OT Stop Time 1531    OT Time Calculation (min) 38 min    Activity Tolerance Patient tolerated treatment well    Behavior During Therapy [facility] for tasks assessed/performed             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   Past Surgical History:  Procedure Laterality Date   ESOPHAGOGASTRODUODENOSCOPY N/A 10/18/2012   Procedure: ESOPHAGOGASTRODUODENOSCOPY (EGD);  Surgeon: Claudette Cue, MD;  Location: [facility];  Service: Endoscopy;  Laterality: N/A;   ESOPHAGOGASTRODUODENOSCOPY (EGD) WITH PROPOFOL  N/A 10/18/2012   Procedure: ESOPHAGOGASTRODUODENOSCOPY (EGD) WITH PROPOFOL ;  Surgeon: Claudette Cue, MD;  Location: [facility];  Service: Endoscopy;  Laterality: N/A;   FINGER SURGERY     IR ANGIO INTRA EXTRACRAN SEL COM CAROTID INNOMINATE BILAT MOD SED  08/23/2023   IR ANGIO VERTEBRAL SEL SUBCLAVIAN INNOMINATE UNI L MOD SED  08/23/2023   IR ANGIO VERTEBRAL SEL VERTEBRAL UNI R MOD SED  08/23/2023   OPEN REDUCTION INTERNAL FIXATION (ORIF) PROXIMAL PHALANX Right 10/12/2022   Procedure: Right ring finger middle phalanx and proximal interphalangeal joint closed reduction internal percutaneous pinning;  Surgeon: Ltanya Rummer, MD;  Location: [facility];  Service: Orthopedics;  Laterality: Right;   Patient Active Problem List   Diagnosis Date Noted   Emotional lability 10/05/2023   Left foot drop 10/05/2023   Cognitive change  08/30/2023   Intraparenchymal hematoma of brain (HCC) 08/24/2023   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   Anxiety state 10/27/2012   Hematemesis 10/18/2012   Other esophagitis 10/18/2012   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 Yes: NPRS scale: 6-7/10 Pain location: hip and elbow on L side and primarily in joints Pain description: burning, tingling Aggravating factors: late in the day and laying on the R side Relieving factors: positioning  FALLS: Has patient fallen in last 6 months? Yes. Number of falls 1  LIVING ENVIRONMENT: Lives with: lives with their family -  mother (new since CVA) Lives in: House/apartment Stairs: Yes: External: 3 steps; none and reports that they are wide Has following equipment at home: Wheelchair (manual), shower chair, and [place] crutch  PLOF: Independent; working in [employer]; driving  PATIENT GOALS: return to  PLOF  OBJECTIVE:  Note: Objective measures were completed at Evaluation unless otherwise noted.  HAND DOMINANCE: Right  ADLs: Overall ADLs: mod I  Equipment: Shower seat with back  IADLs: Shopping: mod A Light housekeeping: mod I Meal Prep: mod I Community mobility: dependent Medication management: mod I Financial management: mod I Handwriting:  no change  MOBILITY STATUS: Needs Assist: SBA with [place]  ACTIVITY TOLERANCE: Activity tolerance: fair  FUNCTIONAL OUTCOME MEASURES: Quick Dash: 27.3 %   UPPER EXTREMITY ROM:    BUE: WFL, bradykinesia on L side through certain planes  UPPER EXTREMITY MMT:     BUE: WFL though LUE demonstrates poor endurance and not balance with RUE  HAND FUNCTION: Grip strength: Right: 162.4 lbs; Left: 105.3 lbs  COORDINATION: 9 Hole Peg test: Right: 23 sec; Left: 36 sec  SENSATION: Paresthesias reported in LUE elbow and shoulder  EDEMA: none reported or observed  MUSCLE TONE: NT  COGNITION: Overall cognitive status: Within functional limits for tasks assessed  VISION: Subjective report: no change, no glasses  VISION ASSESSMENT: WFL  PERCEPTION: WFL  PRAXIS: WFL  OBSERVATIONS: Pt appears well-kept. Ambulates with increased time and SBA using [place]. Tremulous LUE movements.                                                                                                                            TREATMENT :    OT initiated LUE blue theraputty exercises (search, grip, pinch, key grip, spread, and rope squeeze) as noted in patient instructions for coordination and strength OT initiated RUE and LUE blue Thera-Band HEP including shoulder flexion, horizontal abd/add, bicep curl, and tricep extension to promote strengthening of affected extremity and overall endurance as noted in pt instructions.   PATIENT EDUCATION: Education details: Blue theraband and putty HEPs Person educated: Patient and Parent Education method:  Explanation, Demonstration, Verbal cues, and Handouts Education comprehension: verbalized understanding, returned demonstration, verbal cues required, and needs further education  HOME EXERCISE PROGRAM: 10/25/2023: Blue theraband and putty HEPs  GOALS:  SHORT TERM GOALS: Target date: 11/18/2023    Patient will demonstrate initial L UE HEP with 25% verbal cues or less for proper execution. Baseline: Goal status: IN PROGRESS  2.  Pt will independently recall the 5 main sensory precautions (cold, heat, sharp, chemical, and heavy) as needed to prevent injury/harm secondary to impairments.   Baseline:  Goal status: INITIAL  LONG TERM GOALS: Target date: 12/02/2023    Patient will demonstrate updated L UE HEP with visual handouts only for proper execution. Baseline:  Goal status: INITIAL  2.  Pt will report no more than mild difficulty tolerating force or impact through LUE.  Baseline: severe difficulty Goal status: INITIAL  3.  Pt will demonstrate equal strength and endurance with BUE MMT.  Baseline: WFL though LUE demonstrates poor endurance and not balance with RUE Goal status: INITIAL  ASSESSMENT:  CLINICAL IMPRESSION: Patient demonstrates good understanding of HEP as needed to progress towards goals.   PERFORMANCE DEFICITS: in functional skills including ADLs, IADLs, coordination, proprioception, sensation, strength, pain, Fine motor control, Gross motor control, mobility, endurance, decreased knowledge of precautions, and UE functional use.   IMPAIRMENTS: are limiting patient from ADLs, IADLs, rest and sleep, work, leisure, and social participation.   CO-MORBIDITIES: may have co-morbidities  that affects occupational performance. Patient will benefit from skilled OT to address above impairments and improve overall function.  REHAB POTENTIAL: Good  PLAN:  OT FREQUENCY: 1x/week  OT DURATION: 6 weeks  PLANNED INTERVENTIONS: 97168 OT Re-evaluation, 97535 self care/ADL  training, 16109 therapeutic exercise, 97530 therapeutic activity, 97112 neuromuscular re-education, 97140 manual therapy, 97113 aquatic therapy, 97035 ultrasound, 97018 paraffin, 60454 fluidotherapy, 97010 moist heat, 97032 electrical stimulation (manual), 97760 Orthotic Initial, 97763 Orthotic/Prosthetic subsequent, functional mobility training, energy conservation, coping strategies training, patient/family education, and DME and/or AE instructions  RECOMMENDED OTHER SERVICES: N/A for this visit  CONSULTED AND AGREED WITH PLAN OF CARE: Patient and family member/caregiver  PLAN FOR NEXT SESSION: review LUE blue putty and theraband HEP   Altamease Asters, OT 10/25/2023, 4:35 PM   For all possible CPT codes, reference the Planned Interventions line above.     Check all conditions that are expected to impact treatment: {Conditions expected to impact treatment:Musculoskeletal disorders, Contractures, spasticity or fracture relevant to requested treatment, and Neurological condition and/or seizures   If treatment provided at initial evaluation, no treatment charged due to lack of authorization.

## 2023-10-25 ENCOUNTER — Ambulatory Visit: Admitting: Occupational Therapy

## 2023-10-25 ENCOUNTER — Ambulatory Visit: Admitting: Physical Therapy

## 2023-10-25 DIAGNOSIS — R29818 Other symptoms and signs involving the nervous system: Secondary | ICD-10-CM

## 2023-10-25 DIAGNOSIS — M6281 Muscle weakness (generalized): Secondary | ICD-10-CM

## 2023-10-25 DIAGNOSIS — R2681 Unsteadiness on feet: Secondary | ICD-10-CM

## 2023-10-25 DIAGNOSIS — R29898 Other symptoms and signs involving the musculoskeletal system: Secondary | ICD-10-CM

## 2023-10-25 DIAGNOSIS — R2689 Other abnormalities of gait and mobility: Secondary | ICD-10-CM

## 2023-10-25 DIAGNOSIS — R269 Unspecified abnormalities of gait and mobility: Secondary | ICD-10-CM | POA: Diagnosis not present

## 2023-10-25 DIAGNOSIS — R278 Other lack of coordination: Secondary | ICD-10-CM

## 2023-10-25 NOTE — Patient Instructions (Addendum)
 Upper Body Strengthening Exercises Comments Sit upright, away from the back of your chair. Keep abdominal muscles engaged i.e. pull belly button to spine.  FOR ALL EXERCISES: Repeat 10 Times  Hold 3 Seconds  Complete 1 Set  Perform 2 Times a Day  ELASTIC BAND FLEXION   Place unaffected arm on your leg or hip. With your affected arm, hold elastic band in front of you and pull the band upward towards the ceiling as shown.    ELASTIC BAND HORIZONTAL ABDUCTION - SCAPULAR RETRACTION  Start by holding elastic band in front of your chest with your elbows straight. Then, pull your arms apart and towards the side while squeezing your shoulder blades together. Return to starting position and repeat.             ELASTIC BAND TRICEPS EXTENSION  While seated, hold and fixate one end of an elastic band against your chest. Hold the other end with your opposite hand with your elbow bent and arm by your side.   Start by pulling the band downward so that the elbow goes from a bent position to a straightened position as shown. Return to starting position and repeat.   BICEPS CURL WITH BAND  While sitting in an upright position, put an elastic band underneath your feet (as pictured)  OR underneath your thighs. Grip each end of the band, keep the elbows tucked to the body, and bring hands to shoulders by bending at the elbows.      Safety considerations for loss of sensation:   Look at affected hand when using it!   Be careful when using affected arm for anything: sharp, hot, breakable, or too heavy. Always check temperature of water (for showering, washing dishes, etc) with UNaffected arm/extremity Consider travel mugs w/ lids to transport hot liquids/coffee  Avoid cold temperatures as well (wear glove in cold temperatures, get ice w/ unaffected extremity)  AVOID handling chemicals and machinery   Consider alternative options and/or adaptive equipment to make things safer (ex: hand chopper or cut  resistant glove for chopping vegetables)

## 2023-10-25 NOTE — Therapy (Signed)
 OUTPATIENT PHYSICAL THERAPY NEURO TREATMENT    Patient Name: Erik Santana. MRN: [id number] DOB:1990/11/18, 33 y.o., male 52 Date: 10/25/2023   PCP: Lavona Pounds, NP REFERRING PROVIDER: Sterling Eisenmenger, PA-C  END OF SESSION:  PT End of Session - 10/25/23 1406     Visit Number 2    Number of Visits 11    Date for PT Re-Evaluation 12/16/23    Authorization Type  Medicaid    PT Start Time 1404    PT Stop Time 1449    PT Time Calculation (min) 45 min    Equipment Utilized During Treatment Gait belt;Other (comment)   Bioness L300   Activity Tolerance Patient tolerated treatment well    Behavior During Therapy [facility] for tasks assessed/performed             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   Past Surgical History:  Procedure Laterality Date   ESOPHAGOGASTRODUODENOSCOPY N/A 10/18/2012   Procedure: ESOPHAGOGASTRODUODENOSCOPY (EGD);  Surgeon: Claudette Cue, MD;  Location: [facility];  Service: Endoscopy;  Laterality: N/A;   ESOPHAGOGASTRODUODENOSCOPY (EGD) WITH PROPOFOL  N/A 10/18/2012   Procedure: ESOPHAGOGASTRODUODENOSCOPY (EGD) WITH PROPOFOL ;  Surgeon: Claudette Cue, MD;  Location: [facility];  Service: Endoscopy;  Laterality: N/A;   FINGER SURGERY     IR ANGIO INTRA EXTRACRAN SEL COM CAROTID INNOMINATE BILAT MOD SED  08/23/2023   IR ANGIO VERTEBRAL SEL SUBCLAVIAN INNOMINATE UNI L MOD SED  08/23/2023   IR ANGIO VERTEBRAL SEL VERTEBRAL UNI R MOD SED  08/23/2023   OPEN REDUCTION INTERNAL FIXATION (ORIF) PROXIMAL PHALANX Right 10/12/2022   Procedure: Right ring finger middle phalanx and proximal interphalangeal joint closed reduction internal percutaneous pinning;  Surgeon: Ltanya Rummer, MD;  Location: [facility];  Service: Orthopedics;  Laterality: Right;   Patient Active Problem List   Diagnosis Date Noted    Emotional lability 10/05/2023   Left foot drop 10/05/2023   Cognitive change 08/30/2023   Intraparenchymal hematoma of brain (HCC) 08/24/2023   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   Anxiety state 10/27/2012   Hematemesis 10/18/2012   Other esophagitis 10/18/2012   Mallory-Weiss tear 10/18/2012    ONSET DATE: 09/22/2023 (referral date)  REFERRING DIAG: S06.33AA (ICD-10-CM) - Intraparenchymal hematoma of brain due to trauma [facility])  THERAPY DIAG:  Muscle weakness (generalized)  Unsteadiness on feet  Other abnormalities of gait and mobility  Rationale for Evaluation and Treatment: Rehabilitation  SUBJECTIVE:  SUBJECTIVE STATEMENT: Patient arrives to session ambulating with single lofstrand crutch in R arm, L AFO townsend thusane brace, no WC. Reports his leg feels more jumpy today. Needs to call Dr. Dorn Gaskins regarding medication management. States he has another order for a better AFO from Dr. Dorn Gaskins.    Pt accompanied by: self and mother, [name]  PERTINENT HISTORY: parenchymal hemorrhage centered at the right frontal parietal junction, intraparenchymal hematoma within the superior right frontal lobe, seizures   PAIN:  Are you having pain? 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 Yes. 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  DIAGNOSTIC FINDINGS:   MR 08/22/2023: IMPRESSION: Unchanged appearance of intraparenchymal hematoma within the superior right frontal lobe. No mass lesion.  COGNITION: Overall cognitive status: Within functional limits for tasks assessed   SENSATION: Reports reduced sensation on LLE  COORDINATION: WFL for UE, limited testing due to   EDEMA:  Reports intermittent swelling in L ankle, 1+ edema today   MUSCLE TONE: LLE: Moderate and Modifed Ashworth Scale 2 = More marked increase in muscle tone through most of the ROM, but affected part(s) easily moved, LUE - ROM near [facility] but with more challenging tasks falls into mild flexor pattern  POSTURE: No Significant postural limitations  LOWER EXTREMITY ROM:     Active  Right Eval Left Eval  Hip flexion [facility]  Hip extension    Hip abduction WFL 10 degrees past neutral, limited by synergy   Hip adduction  10 degrees past neutral active  Hip internal rotation  5 degrees past neutral active  Hip external rotation    Knee flexion WFL 95 degrees flexion  Knee extension WFL  Very limited due to hamstring tightness only 10 active degrees of active isolated extension but more functional in standing   Ankle dorsiflexion WFL 5 degrees   Ankle plantarflexion WFL Difficulty returning back to plantarflexion once dorsiflexes, limited testing by L AFO  Ankle inversion     Ankle eversion     (Blank rows = not tested)  LOWER EXTREMITY MMT:    MMT Right Eval Left Eval  Hip flexion WFL 4-  Hip extension    Hip abduction [facility] 2-/5  Hip adduction [employer] 2-/5  Hip internal rotation    Hip external rotation    Knee flexion WFL 2-/5  Knee extension WFL 2-/5  Ankle dorsiflexion WFL 2-/5  Ankle plantarflexion [facility]  Ankle inversion    Ankle eversion    (Blank rows = not tested)  Difficult to assess due to degree of LE spasticity, patient with challenges with knee extension, abduction, and adduction, plantarflexion, and dorsiflexion, hamstring notably spastic during session, with gait tends to fall into inversion of ankle and difficulty achieving terminal stance with ~10 degrees of knee flexion in stance  Botox: see subjective for more details BED MOBILITY:  Reports having some challenges with bed mobility but is doing it by himself (to be assessed)   TRANSFERS: Sit to stand: Modified independence  Assistive device utilized: R lofstrand, L AFO     Stand to sit: Modified independence  Assistive device utilized: R lofstrand, L AFO     Chair to chair: Modified independence  Assistive device utilized: R lofstrand, L AFO       GAIT: Findings: Gait Characteristics: decreased stance time- Left, decreased hip/knee flexion- Left, decreased ankle dorsiflexion- Left, circumduction- Left, knee flexed in stance- Left, and wide BOS, Distance walked: various clinic distances, Assistive device utilized:R lofstrand, L AFO, Level of assistance: SBA, and Comments: limited by hamstring spasticity with step length and increased tendency towards ankle inversion with patient pausing intermittently to correct   FUNCTIONAL TESTS:      VITALS:  There were no vitals filed for this visit.                                                                                                TREATMENT:   Ther Act  Gait pattern: step through pattern, decreased stride length, decreased  hip/knee flexion- Left, decreased ankle dorsiflexion- Left, lateral hip instability, lateral lean- Left, wide BOS, and poor foot clearance- Left Distance walked: 230' Assistive device utilized:  L posterior strut AFO w/1/2" heel lift  Level of assistance: CGA Comments: Pt demonstrates abnormal pelvic obliquity (to L) to allow for increased adductor facilitation during swing phase of LLE. Pt maintains L knee in extension and noted mild knee buckling at 200' mark due to fatigue.   Discussed potential to obtain new AFO and have Tawny Fate come to PT session to evaluate pt's gait. Informed pt that therapist would prefer to analyze pt's gait for a few more sessions to better understand his needs, pt verbalized agreement.   NMR Set up pt on Bioness L300 to L anterior tibialis and L hamstring (see tablet 1 for details). Pt w/good motor response to this and will continue to trial in PT to facilitate L knee flexion and reduction of extensor tone.    PATIENT EDUCATION: Education details: Plan to set up visit w/[name] in future, Bioness L300  Person educated: Patient and Parent Education method: Medical illustrator Education comprehension: verbalized understanding, returned demonstration, and needs further education  HOME EXERCISE PROGRAM: To be provided   GOALS: Goals reviewed with patient? Yes  SHORT TERM GOALS: Target date: .WPLUS  Patient will report demonstrate independence with initial HEP in order to maintain current gains and continue to progress after physical therapy discharge.   Baseline: To be provided  Goal status: INITIAL  2.  to be assessed /  LTG written Baseline: To be assessed  Goal status: INITIAL   LONG TERM GOALS: Target date: 12/16/2023  Patient will report demonstrate independence with final HEP in order to maintain current gains and continue to progress after physical therapy discharge.   Baseline: To be provided  Goal status: INITIAL  2.  Patient will  improve gait speed to 0.5 m/s or greater to indicate an improvement from being a household ambulator to limited community ambulator.   Baseline: 0.34 m/s with R lofstrand crutch and L AFO (SBA) Goal status: INITIAL  3.  Patient will improve their 5x Sit to Stand score to less than 15 seconds to demonstrate a decreased risk for falls and improved LE strength.   Baseline: 18.97 seconds without UE use Goal status: INITIAL  4.  to be assessed / LTG to be written Baseline: To be assessed  Goal status: INITIAL  ASSESSMENT:  CLINICAL IMPRESSION: Emphasis of skilled PT session on assessing gait, discussion of AFO options and setting pt up on Bioness L300 to facilitate L knee flexion w/gait. Pt demonstrates L pelvic obliquity that allows increased use of hip adductors to complete swing phase on LLE without ER or circumduction of hip. Pt reports dissatisfaction w/current AFO and states he has another order for a custom one, but will wait until gait is better assessed until therapist makes recommendation. Pt demonstrates significant tightness of medial L thigh, which could be a hip adductor or hamstring, but difficult to differentiate this date. Pt w/good motor response to Bioness L300 to L anterior tibialis and L hamstrings, so will continue to use in sessions. Continue POC.   OBJECTIVE IMPAIRMENTS: Abnormal gait, decreased activity tolerance, decreased balance, decreased endurance, decreased mobility, difficulty walking, decreased ROM, decreased strength, increased fascial restrictions, impaired flexibility, impaired sensation, impaired tone, and pain.   ACTIVITY LIMITATIONS: carrying, standing, transfers, and locomotion level  PARTICIPATION LIMITATIONS: driving, community activity, occupation, and yard work  PERSONAL FACTORS: Age, Time since onset of injury/illness/exacerbation, Transportation, and 1-2 comorbidities: see above  are also affecting patient's functional outcome.   REHAB POTENTIAL:  Good  CLINICAL DECISION MAKING: Evolving/moderate complexity  EVALUATION COMPLEXITY: Moderate  PLAN:  PT FREQUENCY/DURATION: 2x/week for 4 weeks and then 1x week for 2 weeks   PLANNED INTERVENTIONS: 97164- PT Re-evaluation, 97110-Therapeutic exercises, 97530- Therapeutic activity, 97112- Neuromuscular re-education, 97535- Self Care, [id number]- Manual therapy, [phone]- Gait training, [phone]- Orthotic/Prosthetic subsequent, and [phone]- Aquatic Therapy  PLAN FOR NEXT SESSION:   Assess and write LTG, stretching program with emphasis on hamstring tightness, quad strengthening as able, could consider trial of Bioness, high intensity gait training, continue conversation about additional botox (recommended vs not pending gait), L NMR re-ed  Bioness L300 to LLE   [facility] getting connected early [facility], per chart review patient supposed to see neuro psych for emotional lability - follow up on this as needed    *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     Check all conditions that are expected to impact treatment: {Conditions expected to impact treatment:Contractures, spasticity or fracture relevant to requested treatment and Neurological condition and/or seizures   If treatment provided at initial evaluation, no treatment charged due to lack of authorization.      Archibald Marchetta E Kennan Detter, PT, DPT 10/25/2023, 3:00 PM

## 2023-10-26 NOTE — Telephone Encounter (Signed)
 Lidocaine  patch has been denied.

## 2023-10-27 ENCOUNTER — Telehealth (HOSPITAL_COMMUNITY): Payer: Self-pay

## 2023-10-27 NOTE — Telephone Encounter (Signed)
 Called to schedule diagnostic angiogram. Pt does not want to schedule at this time. AB

## 2023-10-28 ENCOUNTER — Ambulatory Visit: Attending: Physician Assistant | Admitting: Physical Therapy

## 2023-10-28 VITALS — BP 145/86 | HR 71

## 2023-10-28 DIAGNOSIS — R29818 Other symptoms and signs involving the nervous system: Secondary | ICD-10-CM | POA: Insufficient documentation

## 2023-10-28 DIAGNOSIS — R29898 Other symptoms and signs involving the musculoskeletal system: Secondary | ICD-10-CM | POA: Diagnosis present

## 2023-10-28 DIAGNOSIS — M6281 Muscle weakness (generalized): Secondary | ICD-10-CM | POA: Insufficient documentation

## 2023-10-28 DIAGNOSIS — R2681 Unsteadiness on feet: Secondary | ICD-10-CM | POA: Diagnosis present

## 2023-10-28 DIAGNOSIS — R2689 Other abnormalities of gait and mobility: Secondary | ICD-10-CM | POA: Insufficient documentation

## 2023-10-28 DIAGNOSIS — R278 Other lack of coordination: Secondary | ICD-10-CM | POA: Insufficient documentation

## 2023-10-28 DIAGNOSIS — R269 Unspecified abnormalities of gait and mobility: Secondary | ICD-10-CM | POA: Diagnosis present

## 2023-10-28 NOTE — Therapy (Signed)
 OUTPATIENT PHYSICAL THERAPY NEURO TREATMENT    Patient Name: Erik Santana. MRN: [id number] DOB:12/17/1990, 33 y.o., male 67 Date: 10/28/2023   PCP: Lavona Pounds, NP REFERRING PROVIDER: Sterling Eisenmenger, PA-C  END OF SESSION:  PT End of Session - 10/28/23 1536     Visit Number 3    Number of Visits 11    Date for PT Re-Evaluation 12/16/23    Authorization Type [place] Medicaid    PT Start Time 1535    PT Stop Time 1628    PT Time Calculation (min) 53 min    Equipment Utilized During Treatment Gait belt;Other (comment)   Bioness L300   Activity Tolerance Patient tolerated treatment well    Behavior During Therapy [facility] for tasks assessed/performed             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   Past Surgical History:  Procedure Laterality Date   ESOPHAGOGASTRODUODENOSCOPY N/A 10/18/2012   Procedure: ESOPHAGOGASTRODUODENOSCOPY (EGD);  Surgeon: Claudette Cue, MD;  Location: [facility];  Service: Endoscopy;  Laterality: N/A;   ESOPHAGOGASTRODUODENOSCOPY (EGD) WITH PROPOFOL  N/A 10/18/2012   Procedure: ESOPHAGOGASTRODUODENOSCOPY (EGD) WITH PROPOFOL ;  Surgeon: Claudette Cue, MD;  Location: [facility];  Service: Endoscopy;  Laterality: N/A;   FINGER SURGERY     IR ANGIO INTRA EXTRACRAN SEL COM CAROTID INNOMINATE BILAT MOD SED  08/23/2023   IR ANGIO VERTEBRAL SEL SUBCLAVIAN INNOMINATE UNI L MOD SED  08/23/2023   IR ANGIO VERTEBRAL SEL VERTEBRAL UNI R MOD SED  08/23/2023   OPEN REDUCTION INTERNAL FIXATION (ORIF) PROXIMAL PHALANX Right 10/12/2022   Procedure: Right ring finger middle phalanx and proximal interphalangeal joint closed reduction internal percutaneous pinning;  Surgeon: Ltanya Rummer, MD;  Location: [facility];  Service: Orthopedics;  Laterality: Right;   Patient Active Problem List   Diagnosis Date Noted    Emotional lability 10/05/2023   Left foot drop 10/05/2023   Cognitive change 08/30/2023   Intraparenchymal hematoma of brain (HCC) 08/24/2023   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   Anxiety state 10/27/2012   Hematemesis 10/18/2012   Other esophagitis 10/18/2012   Mallory-Weiss tear 10/18/2012    ONSET DATE: 09/22/2023 (referral date)  REFERRING DIAG: S06.33AA (ICD-10-CM) - Intraparenchymal hematoma of brain due to trauma [facility])  THERAPY DIAG:  Muscle weakness (generalized)  Unsteadiness on feet  Other abnormalities of gait and mobility  Rationale for Evaluation and Treatment: Rehabilitation  SUBJECTIVE:  SUBJECTIVE STATEMENT: Patient presents wearing L AFO and using R loftstrand crutch. No changes, LLE is more spastic today.    Pt accompanied by: self and mother, [name]  PERTINENT HISTORY: parenchymal hemorrhage centered at the right frontal parietal junction, intraparenchymal hematoma within the superior right frontal lobe, seizures   PAIN:  Are you having pain? 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 Yes. 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  DIAGNOSTIC FINDINGS:   MR 08/22/2023: IMPRESSION: Unchanged appearance of intraparenchymal hematoma within the superior right frontal lobe. No mass lesion.  COGNITION: Overall cognitive status: Within functional limits for tasks assessed   SENSATION: Reports reduced sensation on LLE  COORDINATION: WFL for UE, limited testing due to   EDEMA:  Reports intermittent swelling in L ankle, 1+ edema today   MUSCLE TONE: LLE: Moderate and Modifed Ashworth Scale 2 = More marked increase in muscle tone through most of the ROM, but affected part(s) easily moved, LUE - ROM near [facility] but with more challenging tasks falls into mild flexor pattern  POSTURE: No Significant postural limitations  LOWER EXTREMITY ROM:     Active  Right Eval Left Eval  Hip flexion [facility]  Hip extension    Hip abduction WFL 10 degrees past neutral, limited by synergy   Hip adduction  10 degrees past neutral active  Hip internal rotation  5 degrees past neutral active  Hip external rotation    Knee flexion WFL 95 degrees flexion  Knee extension WFL  Very limited due to hamstring tightness only 10 active degrees of active isolated extension but more functional in standing   Ankle dorsiflexion WFL 5 degrees   Ankle plantarflexion WFL Difficulty returning back to plantarflexion once dorsiflexes, limited testing by L AFO  Ankle inversion    Ankle eversion     (Blank rows = not tested)  LOWER EXTREMITY MMT:    MMT Right Eval Left Eval  Hip flexion WFL 4-  Hip extension    Hip abduction WFL 2-/5  Hip  adduction [facility] 2-/5  Hip internal rotation    Hip external rotation    Knee flexion WFL 2-/5  Knee extension WFL 2-/5  Ankle dorsiflexion WFL 2-/5  Ankle plantarflexion WFL   Ankle inversion    Ankle eversion    (Blank rows = not tested)  Difficult to assess due to degree of LE spasticity, patient with  challenges with knee extension, abduction, and adduction, plantarflexion, and dorsiflexion, hamstring notably spastic during session, with gait tends to fall into inversion of ankle and difficulty achieving terminal stance with ~10 degrees of knee flexion in stance  Botox: see subjective for more details BED MOBILITY:  Reports having some challenges with bed mobility but is doing it by himself (to be assessed)   TRANSFERS: Sit to stand: Modified independence  Assistive device utilized: R lofstrand, L AFO     Stand to sit: Modified independence  Assistive device utilized: R lofstrand, L AFO     Chair to chair: Modified independence  Assistive device utilized: R lofstrand, L AFO       GAIT: Findings: Gait Characteristics: decreased stance time- Left, decreased hip/knee flexion- Left, decreased ankle dorsiflexion- Left, circumduction- Left, knee flexed in stance- Left, and wide BOS, Distance walked: various clinic distances, Assistive device utilized:R lofstrand, L AFO, Level of assistance: SBA, and Comments: limited by hamstring spasticity with step length and increased tendency towards ankle inversion with patient pausing intermittently to correct   FUNCTIONAL TESTS:      VITALS:  Vitals:   10/28/23 1538  BP: (!) 145/86  Pulse: 71                                                                                                  TREATMENT:   Self-care/home managememnt  Assessed vitals (see above) and systolic slightly elevated but within limits for session    NMR Entirety of session spent gait training w/use of Bioness L300 for improved step clearance w/LLE, facilitation of L knee  flexion and determination of proper AFO for pt. Second therapist present to adjust Bioness during gait:  Doffed L AFO and placed bioness lower leg cuff on pt (left heel lift in shoe). Donned upper thigh cuff to hamstrings w/medial placement. Adjusted ramp up time to 0.3 for anterior tibialis to reduce clonus w/DF  Pt attempted to ambulate w/o AD, but unable to take long enough steps to trigger Bioness, so used R loftstrand for session.  Ambulated ~115' w/R loftstrand crutch and initially, pt achieving good ankle DF w/reduced inversion. However, w/fatigue, pt w/increased ankle inversion. Added heel wedge to lateral aspect of shoe to provide some stability and ambulated ~70', but pt's extension tone too strong and wedge was not helpful.  Donned L AFO and removed heel wedge from shoe (heel lift still in shoe) and placed lower leg cuff over AFO:  Pt w/improved ankle stability without use of lower leg cuff. Over the span of ~600', adjusted position and strength of upper thigh cuff and pt most successful when cuff biased to lateral hamstrings. Cued pt to "march" when lifting LLE to facilitate knee flexion, which pt able to do well.  Discussed plan to contact Tawny Fate [facility]) and have her observe pt's session next Friday (5/9) to determine which modifications can be made to pt's AFO to reduce ankle inversion and provide more lateral stability. Pt in agreement with this.  Pt inquiring about what he can do "at the crib" to strengthen LLE. Verbally added toe taps to  cabinet w/LLE to HEP, but pt unsure if he has a cabinet he can do this with. Informed pt to place a book or sturdy object on floor to tap to instead, pt to look into this.    PATIENT EDUCATION: Education details: Plan to set up visit w/[name] in future, Bioness L300, verbal additions to HEP Person educated: Patient and Parent Education method: Medical illustrator Education comprehension: verbalized understanding, returned demonstration, and  needs further education  HOME EXERCISE PROGRAM: Verbally added toe taps to step or sturdy object on 5/2  GOALS: Goals reviewed with patient? Yes  SHORT TERM GOALS: Target date: 11/18/23  Patient will report demonstrate independence with initial HEP in order to maintain current gains and continue to progress after physical therapy discharge.   Baseline: To be provided  Goal status: INITIAL  2.  to be assessed / LTG written Baseline: To be assessed  Goal status: INITIAL   LONG TERM GOALS: Target date: 12/16/2023  Patient will report demonstrate independence with final HEP in order to maintain current gains and continue to progress after physical therapy discharge.   Baseline: To be provided  Goal status: INITIAL  2.  Patient will improve gait speed to 0.5 m/s or greater to indicate an improvement from being a household ambulator to limited community ambulator.   Baseline: 0.34 m/s with R lofstrand crutch and L AFO (SBA) Goal status: INITIAL  3.  Patient will improve their 5x Sit to Stand score to less than 15 seconds to demonstrate a decreased risk for falls and improved LE strength.   Baseline: 18.97 seconds without UE use Goal status: INITIAL  4.  to be assessed / LTG to be written Baseline: To be assessed  Goal status: INITIAL  ASSESSMENT:  CLINICAL IMPRESSION: Emphasis of skilled PT session on gait training w/Bioness L300 and discussing plan for obtaining new AFO. Pt unable to tolerate electrical stimulation to anterior tibialis due to clonus and significant extensor tone. Pt able to facilitate the greatest knee flexion when Bioness upper thigh cuff biased to lateral hamstrings and pt cued to "march". Therapist to contact Tawny Fate at [facility] and request her presence at PT session on 5/9 to assess for modifications to pt's current AFO, as it is not sturdy enough to manage pt's extensor tone. Continue POC.   OBJECTIVE IMPAIRMENTS: Abnormal gait, decreased activity  tolerance, decreased balance, decreased endurance, decreased mobility, difficulty walking, decreased ROM, decreased strength, increased fascial restrictions, impaired flexibility, impaired sensation, impaired tone, and pain.   ACTIVITY LIMITATIONS: carrying, standing, transfers, and locomotion level  PARTICIPATION LIMITATIONS: driving, community activity, occupation, and yard work  PERSONAL FACTORS: Age, Time since onset of injury/illness/exacerbation, Transportation, and 1-2 comorbidities: see above  are also affecting patient's functional outcome.   REHAB POTENTIAL: Good  CLINICAL DECISION MAKING: Evolving/moderate complexity  EVALUATION COMPLEXITY: Moderate  PLAN:  PT FREQUENCY/DURATION: 2x/week for 4 weeks and then 1x week for 2 weeks   PLANNED INTERVENTIONS: [id number]- PT Re-evaluation, 97110-Therapeutic exercises, 97530- Therapeutic activity, [id number]- Neuromuscular re-education, 97535- Self Care, [id number]- Manual therapy, [id number]- Gait training, [phone]- Orthotic/Prosthetic subsequent, and [phone]- Aquatic Therapy  PLAN FOR NEXT SESSION: contact olivia about 5/9.   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     Check all conditions that are expected to impact treatment: {Conditions expected to impact treatment:Contractures, spasticity or fracture relevant to requested treatment and Neurological condition and/or seizures   If  treatment provided at initial evaluation, no treatment charged due to lack of authorization.      Anesia Blackwell E Raygan Skarda, PT, DPT 10/28/2023, 4:29 PM

## 2023-11-01 ENCOUNTER — Ambulatory Visit: Admitting: Occupational Therapy

## 2023-11-01 ENCOUNTER — Encounter: Payer: Self-pay | Admitting: Physical Therapy

## 2023-11-01 ENCOUNTER — Ambulatory Visit: Admitting: Physical Therapy

## 2023-11-01 VITALS — BP 142/95 | HR 75

## 2023-11-01 DIAGNOSIS — R278 Other lack of coordination: Secondary | ICD-10-CM

## 2023-11-01 DIAGNOSIS — R29898 Other symptoms and signs involving the musculoskeletal system: Secondary | ICD-10-CM

## 2023-11-01 DIAGNOSIS — M6281 Muscle weakness (generalized): Secondary | ICD-10-CM

## 2023-11-01 DIAGNOSIS — R2681 Unsteadiness on feet: Secondary | ICD-10-CM

## 2023-11-01 DIAGNOSIS — R2689 Other abnormalities of gait and mobility: Secondary | ICD-10-CM

## 2023-11-01 DIAGNOSIS — R29818 Other symptoms and signs involving the nervous system: Secondary | ICD-10-CM

## 2023-11-01 DIAGNOSIS — R269 Unspecified abnormalities of gait and mobility: Secondary | ICD-10-CM

## 2023-11-01 NOTE — Therapy (Unsigned)
 OUTPATIENT PHYSICAL THERAPY NEURO TREATMENT    Patient Name: Erik Santana. MRN: [id number] DOB:12-31-1990, 33 y.o., male 74 Date: 11/02/2023   PCP: Lavona Pounds, NP REFERRING PROVIDER: Sterling Eisenmenger, PA-C  END OF SESSION:  PT End of Session - 11/01/23 1323     Visit Number 4    Number of Visits 11    Date for PT Re-Evaluation 12/16/23    Authorization Type [place] Medicaid    PT Start Time 1324   patient arrives late to session   PT Stop Time 1400    PT Time Calculation (min) 36 min    Equipment Utilized During Treatment Gait belt    Activity Tolerance Patient tolerated treatment well    Behavior During Therapy WFL for tasks assessed/performed             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   Past Surgical History:  Procedure Laterality Date   ESOPHAGOGASTRODUODENOSCOPY N/A 10/18/2012   Procedure: ESOPHAGOGASTRODUODENOSCOPY (EGD);  Surgeon: Claudette Cue, MD;  Location: [facility];  Service: Endoscopy;  Laterality: N/A;   ESOPHAGOGASTRODUODENOSCOPY (EGD) WITH PROPOFOL  N/A 10/18/2012   Procedure: ESOPHAGOGASTRODUODENOSCOPY (EGD) WITH PROPOFOL ;  Surgeon: Claudette Cue, MD;  Location: [facility];  Service: Endoscopy;  Laterality: N/A;   FINGER SURGERY     IR ANGIO INTRA EXTRACRAN SEL COM CAROTID INNOMINATE BILAT MOD SED  08/23/2023   IR ANGIO VERTEBRAL SEL SUBCLAVIAN INNOMINATE UNI L MOD SED  08/23/2023   IR ANGIO VERTEBRAL SEL VERTEBRAL UNI R MOD SED  08/23/2023   OPEN REDUCTION INTERNAL FIXATION (ORIF) PROXIMAL PHALANX Right 10/12/2022   Procedure: Right ring finger middle phalanx and proximal interphalangeal joint closed reduction internal percutaneous pinning;  Surgeon: Ltanya Rummer, MD;  Location: [facility];  Service: Orthopedics;  Laterality: Right;   Patient Active Problem List   Diagnosis Date Noted    Emotional lability 10/05/2023   Left foot drop 10/05/2023   Cognitive change 08/30/2023   Intraparenchymal hematoma of brain (HCC) 08/24/2023   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   Anxiety state 10/27/2012   Hematemesis 10/18/2012   Other esophagitis 10/18/2012   Mallory-Weiss tear 10/18/2012    ONSET DATE: 09/22/2023 (referral date)  REFERRING DIAG: S06.33AA (ICD-10-CM) - Intraparenchymal hematoma of brain due to trauma [facility])  THERAPY DIAG:  Abnormality of gait and mobility  Other abnormalities of gait and mobility  Unsteadiness on feet  Muscle weakness (generalized)  Rationale for Evaluation and Treatment: Rehabilitation  SUBJECTIVE:  SUBJECTIVE STATEMENT: Patient presents wearing L AFO and using R loftstrand crutch. Reports no new changes or falls and near falls. Reports has been working on knee bending at home frequently.   Pt accompanied by: self and mother, Erik Santana  PERTINENT HISTORY: parenchymal hemorrhage centered at the right frontal parietal junction, intraparenchymal hematoma within the superior right frontal lobe, seizures   PAIN:  Are you having pain? 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 Yes. 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  DIAGNOSTIC FINDINGS:   MR 08/22/2023: IMPRESSION: Unchanged appearance of intraparenchymal hematoma within the superior right frontal lobe. No mass lesion.  COGNITION: Overall cognitive status: Within functional limits for tasks assessed   SENSATION: Reports reduced sensation on LLE  COORDINATION: WFL for UE, limited testing due to   EDEMA:  Reports intermittent swelling in L ankle, 1+ edema today   MUSCLE TONE: LLE: Moderate and Modifed Ashworth Scale 2 = More marked increase in muscle tone through most of the ROM, but affected part(s) easily moved, LUE - ROM near [facility] but with more challenging tasks falls into mild flexor pattern  POSTURE: No Significant postural limitations  LOWER EXTREMITY ROM:     Active  Right Eval Left Eval  Hip flexion [facility]  Hip extension    Hip abduction WFL 10 degrees past neutral, limited by synergy   Hip adduction  10 degrees past neutral active  Hip internal rotation  5 degrees past neutral active  Hip external rotation    Knee flexion WFL 95 degrees flexion  Knee extension WFL  Very limited due to hamstring tightness only 10 active degrees of active isolated extension but more functional in standing   Ankle dorsiflexion WFL 5 degrees   Ankle plantarflexion WFL Difficulty returning back to plantarflexion once dorsiflexes, limited testing by L AFO  Ankle inversion    Ankle eversion     (Blank rows = not tested)  LOWER EXTREMITY  MMT:    MMT Right Eval Left Eval  Hip flexion WFL 4-  Hip extension    Hip abduction [facility] 2-/5  Hip adduction [facility] 2-/5  Hip internal rotation    Hip external rotation    Knee flexion WFL 2-/5  Knee extension WFL 2-/5  Ankle dorsiflexion WFL 2-/5  Ankle plantarflexion WFL   Ankle inversion    Ankle eversion    (Blank rows = not tested)  Difficult to assess due to degree of LE spasticity, patient with challenges with knee extension, abduction, and adduction, plantarflexion, and dorsiflexion, hamstring notably spastic during session, with gait tends to fall into inversion of ankle and difficulty achieving terminal stance with ~10 degrees of knee flexion in stance  Botox: see subjective for more details BED MOBILITY:  Reports having some challenges with bed mobility but is doing it by himself (to be assessed)   TRANSFERS: Sit to stand: Modified independence  Assistive device utilized: R lofstrand, L AFO     Stand to sit: Modified independence  Assistive device utilized: R lofstrand, L AFO     Chair to chair: Modified independence  Assistive device utilized: R lofstrand, L AFO       GAIT: Findings: Gait Characteristics: decreased stance time- Left, decreased hip/knee flexion- Left, decreased ankle dorsiflexion- Left, circumduction- Left, knee flexed in stance- Left, and wide BOS, Distance walked: various clinic distances, Assistive device utilized:R lofstrand, L AFO, Level of assistance: SBA, and Comments: limited by hamstring spasticity with step length and increased tendency towards ankle inversion with patient pausing intermittently to correct   FUNCTIONAL TESTS:      VITALS:  Vitals:   11/01/23 1329  BP: (!) 142/95  Pulse: 75                                                                                                TREATMENT:    Self Care: Vitals:   11/01/23 1329  BP: (!) 142/95  Pulse: 75  Assessed vitals (see above) and diastolic elevated, patient was rushing in  clinic, emphasize importance of close monitoring at home and verbalized understanding   Patient expresses desire to drop frequency or D/C OT, PT encourages patient to discuss with OT POC Discuss [facility] [place] clinics briefly, provided patient resource and encouraged to reach out as soon as possible as there may be a wait list, explained that will not impact visit count, patient expressed interest in [place] clinic as closer to home Patient at end of session reports wanting PT to look at gait without AFO, PT explains that while AFO may not provide ideal stability does provide enough to safely work on other parts of gait pattern that would not be possible given lack of ankle instability without AFO Explained plan with [name] coming to next session    NMR Trialed knee and hip flexion assist with blue theraband pulling ankle into eversion; PT donned max A with patient reporting tolerance without pian, anchored to gait belt  Ambulated 2 x 80 feet 1x with theraband assist and then doffed without AD (CGA) - patient without cueing actively engaging in flexion, resulted in almost steppage like gait pattern (with hip abduction and ankle inversion) as difficulty achieving knee extension for full stride on L side and also performed marching pattern on unaffected side, initially step to and with cueing from PT progressed to step through with cues for long step on the R unaffected side  With longer distances 2 instances of clonus that patient able to self correct, tendency for toe in gait pattern falling into inversion  with fatigue, given degree of ankle  Doffed theraband on second set (see above) due to patient ability to achieve hip knee and flexion in synergist pattern independently today without need for external assist  Use of mirror for visual feedback working on 3 x 20 feet of gait forward and 3 x 20 feet backwards, short stride bilaterally with backwards gait and then progressed to slow step on the R side and longer  step on L side (gait pattern otherwise similar to what noted above) Staggered sit to stand with L leg back working on eccentric lower for NMR to L leg 2 x 10 reps Cues to shift weight to the L, added to HEP  PATIENT EDUCATION: Education details: Initial HEP  Person educated: Patient and Parent Education method: Explanation, Facilities manager, and Handouts Education comprehension: verbalized understanding, returned demonstration, and needs further education  HOME EXERCISE PROGRAM: Access Code: [email or web address] URL: [email or web address] Date: 11/01/2023 Prepared by: Camella Cave  Exercises - Staggered Sit-to-Stand  - 1 x daily - 7 x weekly - 3 sets - 10 reps - Backward Walking with Counter Support  - 1 x daily - 7 x weekly - 5 sets  GOALS: Goals reviewed with patient? Yes  SHORT TERM GOALS: Target date: 11/18/23  Patient will report demonstrate independence with initial HEP in order to maintain current gains and continue to progress after physical therapy discharge.   Baseline: To be provided  Goal status: INITIAL  2.  to be assessed / LTG written Baseline: To be assessed  Goal status: INITIAL   LONG TERM GOALS: Target date: 12/16/2023  Patient will report demonstrate independence with final HEP in order to maintain current gains and continue to progress after physical therapy discharge.   Baseline: To be provided  Goal status: INITIAL  2.  Patient will improve gait speed to 0.5 m/s or greater to indicate an improvement from being a household ambulator to limited community ambulator.   Baseline: 0.34 m/s with R lofstrand crutch and L AFO (SBA) Goal status: INITIAL  3.  Patient will improve their 5x Sit to Stand score to less than 15 seconds to demonstrate a decreased risk for falls and improved LE strength.   Baseline: 18.97 seconds without UE use Goal status: INITIAL  4.  to be assessed / LTG to be written Baseline: To be assessed  Goal status:  INITIAL  ASSESSMENT:  CLINICAL IMPRESSION: Emphasis of skilled PT session on getting connected to [facility] clinic, recommendation of ongoing use of AFO, and plans for upcoming modification session with orthotist, creation of initial HEP, and gait training. Since last session, patient working extensively on flexion during gait to the point of creating nearly a false steppage gait pattern on both affected and unaffected side within synergist pattern into on affected side. PT educated on still taking long slow step on R side as able to normalize gait and promote longer weight acceptance on L leg. Introduced backwards gait to work on hip extension as tolerated though limited by weakness. Continue POC.   OBJECTIVE IMPAIRMENTS: Abnormal gait, decreased activity tolerance, decreased balance, decreased endurance, decreased mobility, difficulty walking, decreased ROM, decreased strength, increased fascial restrictions, impaired flexibility, impaired sensation, impaired tone, and pain.   ACTIVITY LIMITATIONS: carrying, standing, transfers, and locomotion level  PARTICIPATION LIMITATIONS: driving, community activity, occupation, and yard work  PERSONAL FACTORS: Age, Time since onset of injury/illness/exacerbation, Transportation, and 1-2 comorbidities: see above  are also affecting patient's functional outcome.   REHAB POTENTIAL: Good  CLINICAL  DECISION MAKING: Evolving/moderate complexity  EVALUATION COMPLEXITY: Moderate  PLAN:  PT FREQUENCY/DURATION: 2x/week for 4 weeks and then 1x week for 2 weeks   PLANNED INTERVENTIONS: 97164- PT Re-evaluation, 97110-Therapeutic exercises, 97530- Therapeutic activity, [id number]- Neuromuscular re-education, 97535- Self Care, 40981- Manual therapy, [id number]- Gait training, [phone]- Orthotic/Prosthetic subsequent, and [phone]- Aquatic Therapy  PLAN FOR NEXT SESSION: contact olivia about 5/9.   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     Check all conditions that are expected to impact treatment: {Conditions expected to impact treatment:Contractures, spasticity or fracture relevant to requested treatment and Neurological condition and/or seizures   If treatment provided at initial evaluation, no treatment charged due to lack of authorization.      Coreen Devoid, PT, DPT 11/02/2023, 8:29 AM

## 2023-11-01 NOTE — Therapy (Deleted)
 OUTPATIENT OCCUPATIONAL THERAPY NEURO TREATMENT  Patient Name: Erik Santana. MRN: [id number] DOB:26-Nov-1990, 33 y.o., male 69 Date: 11/01/2023  PCP: Lavona Pounds, NP  REFERRING PROVIDER: Sterling Eisenmenger, PA-C  END OF SESSION:  OT End of Session - 11/01/23 1357     Visit Number 3    Number of Visits 7    Date for OT Re-Evaluation 12/02/23    Authorization Type [place] Medicaid    Activity Tolerance Patient tolerated treatment well    Behavior During Therapy [facility] for tasks assessed/performed              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   Past Surgical History:  Procedure Laterality Date   ESOPHAGOGASTRODUODENOSCOPY N/A 10/18/2012   Procedure: ESOPHAGOGASTRODUODENOSCOPY (EGD);  Surgeon: Claudette Cue, MD;  Location: [facility];  Service: Endoscopy;  Laterality: N/A;   ESOPHAGOGASTRODUODENOSCOPY (EGD) WITH PROPOFOL  N/A 10/18/2012   Procedure: ESOPHAGOGASTRODUODENOSCOPY (EGD) WITH PROPOFOL ;  Surgeon: Claudette Cue, MD;  Location: [facility];  Service: Endoscopy;  Laterality: N/A;   FINGER SURGERY     IR ANGIO INTRA EXTRACRAN SEL COM CAROTID INNOMINATE BILAT MOD SED  08/23/2023   IR ANGIO VERTEBRAL SEL SUBCLAVIAN INNOMINATE UNI L MOD SED  08/23/2023   IR ANGIO VERTEBRAL SEL VERTEBRAL UNI R MOD SED  08/23/2023   OPEN REDUCTION INTERNAL FIXATION (ORIF) PROXIMAL PHALANX Right 10/12/2022   Procedure: Right ring finger middle phalanx and proximal interphalangeal joint closed reduction internal percutaneous pinning;  Surgeon: Ltanya Rummer, MD;  Location: [facility];  Service: Orthopedics;  Laterality: Right;   Patient Active Problem List   Diagnosis Date Noted   Emotional lability 10/05/2023   Left foot drop 10/05/2023   Cognitive change 08/30/2023   Intraparenchymal hematoma of brain (HCC) 08/24/2023   Non-traumatic  intracranial hemorrhage (HCC) 08/21/2023   Anxiety state 10/27/2012   Hematemesis 10/18/2012   Other esophagitis 10/18/2012   Mallory-Weiss tear 10/18/2012    ONSET DATE: 09/22/2023 (Date of referral)  REFERRING DIAG: S06.33AA (ICD-10-CM) - Contusion and laceration of cerebrum, unspecified, with loss of consciousness status unknown, initial encounter  THERAPY DIAG:  Muscle weakness (generalized)  Other lack of coordination  Other symptoms and signs involving the nervous system  Other symptoms and signs involving the musculoskeletal system  Rationale for Evaluation and Treatment: Rehabilitation  SUBJECTIVE:   SUBJECTIVE STATEMENT: ***  He goes by "Sherrill", pronounced like Earl with an "S"  Pt accompanied by:  mother, [name]  PERTINENT HISTORY: parenchymal hemorrhage centered at the right frontal parietal junction, intraparenchymal hematoma within the superior right frontal lobe, seizures   PRECAUTIONS:  Fall and 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 Yes: NPRS scale: 6-7/10 Pain location: hip and elbow on L side and primarily in joints Pain description: burning, tingling Aggravating factors: late in the day and laying on the R side Relieving factors: positioning  FALLS: Has patient fallen in last 6 months? Yes. Number of falls 1  LIVING ENVIRONMENT: Lives with: lives with their family - mother (new since CVA) Lives in: House/apartment Stairs: Yes: External: 3 steps; none and reports that they are wide Has following equipment at home: Wheelchair (manual), shower chair, and  Lostrand crutch  PLOF: Independent; working in Production manager; driving  PATIENT GOALS: return to PLOF  OBJECTIVE:  Note: Objective measures were completed at Evaluation unless otherwise noted.  HAND DOMINANCE:  Right  ADLs: Overall ADLs: mod I  Equipment: Shower seat with back  IADLs: Shopping: mod A Light housekeeping: mod I Meal Prep: mod I Community mobility: dependent Medication management: mod I Financial management: mod I Handwriting:  no change  MOBILITY STATUS: Needs Assist: SBA with Loftstrand  ACTIVITY TOLERANCE: Activity tolerance: fair  FUNCTIONAL OUTCOME MEASURES: Quick Dash: 27.3 %   UPPER EXTREMITY ROM:    BUE: WFL, bradykinesia on L side through certain planes  UPPER EXTREMITY MMT:     BUE: WFL though LUE demonstrates poor endurance and not balance with RUE  HAND FUNCTION: Grip strength: Right: 162.4 lbs; Left: 105.3 lbs  COORDINATION: 9 Hole Peg test: Right: 23 sec; Left: 36 sec  SENSATION: Paresthesias reported in LUE elbow and shoulder  EDEMA: none reported or observed  MUSCLE TONE: NT  COGNITION: Overall cognitive status: Within functional limits for tasks assessed  VISION: Subjective report: no change, no glasses  VISION ASSESSMENT: WFL  PERCEPTION: WFL  PRAXIS: WFL  OBSERVATIONS: Pt appears well-kept. Ambulates with increased time and SBA using Lofstrand. Tremulous LUE movements.                                                                                                                            TREATMENT :    OT initiated LUE blue theraputty exercises (search, grip, pinch, key grip, spread, and rope squeeze) as noted in patient instructions for coordination and strength OT initiated RUE and LUE blue Thera-Band HEP including shoulder flexion, horizontal abd/add, bicep curl, and tricep extension to promote strengthening of affected extremity and overall endurance as noted in pt instructions.    *** PATIENT EDUCATION: Education details: Blue theraband and putty HEPs Person educated: Patient and Parent Education method: Explanation, Demonstration, Verbal cues, and Handouts Education comprehension: verbalized understanding,  returned demonstration, verbal cues required, and needs further education  HOME EXERCISE PROGRAM: 10/25/2023: Blue theraband and putty HEPs  GOALS:  SHORT TERM GOALS: Target date: 11/18/2023    Patient will demonstrate initial L UE HEP with 25% verbal cues or less for proper execution. Baseline: Goal status: IN PROGRESS  2.  Pt will independently recall the 5 main sensory precautions (cold, heat, sharp, chemical, and heavy) as needed to prevent injury/harm secondary to impairments.   Baseline:  Goal status: INITIAL  LONG TERM GOALS: Target date: 12/02/2023    Patient will demonstrate updated L UE HEP with visual handouts only for proper execution. Baseline:  Goal status: INITIAL  2.  Pt will report no more than mild difficulty tolerating force or impact through LUE.  Baseline: severe difficulty Goal status: INITIAL  3.  Pt will demonstrate equal strength and endurance with BUE MMT.  Baseline: WFL though LUE demonstrates poor endurance and not balance with RUE Goal status:  INITIAL  ASSESSMENT:  CLINICAL IMPRESSION: Patient demonstrates good understanding of HEP as needed to progress towards goals. ***  PERFORMANCE DEFICITS: in functional skills including ADLs, IADLs, coordination, proprioception, sensation, strength, pain, Fine motor control, Gross motor control, mobility, endurance, decreased knowledge of precautions, and UE functional use.   IMPAIRMENTS: are limiting patient from ADLs, IADLs, rest and sleep, work, leisure, and social participation.   CO-MORBIDITIES: may have co-morbidities  that affects occupational performance. Patient will benefit from skilled OT to address above impairments and improve overall function.  REHAB POTENTIAL: Good  PLAN:  OT FREQUENCY: 1x/week  OT DURATION: 6 weeks  PLANNED INTERVENTIONS: 97168 OT Re-evaluation, 97535 self care/ADL training, 57846 therapeutic exercise, 97530 therapeutic activity, 97112 neuromuscular re-education, 97140  manual therapy, 97113 aquatic therapy, 97035 ultrasound, 97018 paraffin, 96295 fluidotherapy, 97010 moist heat, 97032 electrical stimulation (manual), 97760 Orthotic Initial, 97763 Orthotic/Prosthetic subsequent, functional mobility training, energy conservation, coping strategies training, patient/family education, and DME and/or AE instructions  RECOMMENDED OTHER SERVICES: N/A for this visit  CONSULTED AND AGREED WITH PLAN OF CARE: Patient and family member/caregiver  PLAN FOR NEXT SESSION: *** review LUE blue putty and theraband HEP  Review sensory precautions Golf Deatra Face, OT 11/01/2023, 1:57 PM   For all possible CPT codes, reference the Planned Interventions line above.     Check all conditions that are expected to impact treatment: {Conditions expected to impact treatment:Musculoskeletal disorders, Contractures, spasticity or fracture relevant to requested treatment, and Neurological condition and/or seizures   If treatment provided at initial evaluation, no treatment charged due to lack of authorization.

## 2023-11-01 NOTE — Therapy (Signed)
 OT Visit Arrived No Charge  Pt spoke to OT requesting a reduction in OT therapy visits to allow more focus on walking and LE deficits. Pt reports good understanding of LUE HEP and therapy progression. Will see pt in 3 weeks for further assessment of needs.

## 2023-11-04 ENCOUNTER — Ambulatory Visit: Admitting: Physical Therapy

## 2023-11-04 DIAGNOSIS — M6281 Muscle weakness (generalized): Secondary | ICD-10-CM

## 2023-11-04 DIAGNOSIS — R2689 Other abnormalities of gait and mobility: Secondary | ICD-10-CM

## 2023-11-04 DIAGNOSIS — R2681 Unsteadiness on feet: Secondary | ICD-10-CM

## 2023-11-04 NOTE — Therapy (Signed)
 OUTPATIENT PHYSICAL THERAPY NEURO TREATMENT    Patient Name: Erik Santana. MRN: [id number] DOB:June 17, 1991, 33 y.o., male 5 Date: 11/04/2023   PCP: Lavona Pounds, NP REFERRING PROVIDER: Sterling Eisenmenger, PA-C  END OF SESSION:  PT End of Session - 11/04/23 0930     Visit Number 5    Number of Visits 11    Date for PT Re-Evaluation 12/16/23    Authorization Type [place] [employer]    PT Start Time 0851   Pt arrived late   PT Stop Time 0931    PT Time Calculation (min) 40 min    Equipment Utilized During Treatment Gait belt    Activity Tolerance Patient tolerated treatment well    Behavior During Therapy [facility] for tasks assessed/performed              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   Past Surgical History:  Procedure Laterality Date   ESOPHAGOGASTRODUODENOSCOPY N/A 10/18/2012   Procedure: ESOPHAGOGASTRODUODENOSCOPY (EGD);  Surgeon: Claudette Cue, MD;  Location: [facility];  Service: Endoscopy;  Laterality: N/A;   ESOPHAGOGASTRODUODENOSCOPY (EGD) WITH PROPOFOL  N/A 10/18/2012   Procedure: ESOPHAGOGASTRODUODENOSCOPY (EGD) WITH PROPOFOL ;  Surgeon: Claudette Cue, MD;  Location: [facility];  Service: Endoscopy;  Laterality: N/A;   FINGER SURGERY     IR ANGIO INTRA EXTRACRAN SEL COM CAROTID INNOMINATE BILAT MOD SED  08/23/2023   IR ANGIO VERTEBRAL SEL SUBCLAVIAN INNOMINATE UNI L MOD SED  08/23/2023   IR ANGIO VERTEBRAL SEL VERTEBRAL UNI R MOD SED  08/23/2023   OPEN REDUCTION INTERNAL FIXATION (ORIF) PROXIMAL PHALANX Right 10/12/2022   Procedure: Right ring finger middle phalanx and proximal interphalangeal joint closed reduction internal percutaneous pinning;  Surgeon: Ltanya Rummer, MD;  Location:  SURGERY CENTER;  Service: Orthopedics;  Laterality: Right;   Patient Active Problem List   Diagnosis Date Noted   Emotional  lability 10/05/2023   Left foot drop 10/05/2023   Cognitive change 08/30/2023   Intraparenchymal hematoma of brain (HCC) 08/24/2023   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   Anxiety state 10/27/2012   Hematemesis 10/18/2012   Other esophagitis 10/18/2012   Mallory-Weiss tear 10/18/2012    ONSET DATE: 09/22/2023 (referral date)  REFERRING DIAG: S06.33AA (ICD-10-CM) - Intraparenchymal hematoma of brain due to trauma [facility])  THERAPY DIAG:  Other abnormalities of gait and mobility  Unsteadiness on feet  Muscle weakness (generalized)  Rationale for Evaluation and Treatment: Rehabilitation  SUBJECTIVE:  SUBJECTIVE STATEMENT: Patient presents wearing Crocs, holding tennis shoes and L AFO. Pt reports he walks better w/o his AFO on and has been working a lot on bending his L knee and pulling his foot up w/gait. No pain or falls.   Pt accompanied by: self and mother, [name]  PERTINENT HISTORY: parenchymal hemorrhage centered at the right frontal parietal junction, intraparenchymal hematoma within the superior right frontal lobe, seizures   PAIN:  Are you having pain? 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 Yes. 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  DIAGNOSTIC FINDINGS:   MR 08/22/2023: IMPRESSION: Unchanged appearance of intraparenchymal hematoma within the superior right frontal lobe. No mass lesion.  COGNITION: Overall cognitive status: Within functional limits for tasks assessed   SENSATION: Reports reduced sensation on LLE  COORDINATION: WFL for UE, limited testing due to   EDEMA:  Reports intermittent swelling in L ankle, 1+ edema today   MUSCLE TONE: LLE: Moderate and Modifed Ashworth Scale 2 = More marked increase in muscle tone through most of the ROM, but affected part(s) easily moved, LUE - ROM near [facility] but with more challenging tasks falls into mild flexor pattern  POSTURE: No Significant postural limitations  LOWER EXTREMITY ROM:     Active  Right Eval Left Eval  Hip flexion [facility]  Hip extension    Hip abduction WFL 10 degrees past neutral, limited by synergy   Hip adduction  10 degrees past neutral active  Hip internal rotation  5 degrees past neutral active  Hip external rotation    Knee flexion WFL 95 degrees flexion  Knee extension WFL  Very limited due to hamstring tightness only 10 active degrees of active isolated extension but more functional in standing   Ankle dorsiflexion WFL 5 degrees   Ankle plantarflexion WFL Difficulty returning back to plantarflexion once dorsiflexes, limited testing by L AFO  Ankle inversion    Ankle eversion     (Blank rows = not tested)  LOWER EXTREMITY MMT:    MMT  Right Eval Left Eval  Hip flexion WFL 4-  Hip extension    Hip abduction [facility] 2-/5  Hip adduction [facility] 2-/5  Hip internal rotation    Hip external rotation    Knee flexion WFL 2-/5  Knee extension WFL 2-/5  Ankle dorsiflexion WFL 2-/5  Ankle plantarflexion WFL   Ankle inversion  Ankle eversion    (Blank rows = not tested)  Difficult to assess due to degree of LE spasticity, patient with challenges with knee extension, abduction, and adduction, plantarflexion, and dorsiflexion, hamstring notably spastic during session, with gait tends to fall into inversion of ankle and difficulty achieving terminal stance with ~10 degrees of knee flexion in stance  Botox: see subjective for more details BED MOBILITY:  Reports having some challenges with bed mobility but is doing it by himself (to be assessed)   TRANSFERS: Sit to stand: Modified independence  Assistive device utilized: R lofstrand, L AFO     Stand to sit: Modified independence  Assistive device utilized: R lofstrand, L AFO     Chair to chair: Modified independence  Assistive device utilized: R lofstrand, L AFO       GAIT: Findings: Gait Characteristics: decreased stance time- Left, decreased hip/knee flexion- Left, decreased ankle dorsiflexion- Left, circumduction- Left, knee flexed in stance- Left, and wide BOS, Distance walked: various clinic distances, Assistive device utilized:R lofstrand, L AFO, Level of assistance: SBA, and Comments: limited by hamstring spasticity with step length and increased tendency towards ankle inversion with patient pausing intermittently to correct   FUNCTIONAL TESTS:      VITALS:  There were no vitals filed for this visit.                                                                                               TREATMENT:    Ther Act   Gait pattern: step through pattern, decreased arm swing- Left, decreased stride length, decreased hip/knee flexion- Left, decreased ankle dorsiflexion-  Left, lateral hip instability, and lateral lean- Right Distance walked: 115' Assistive device utilized: L thermoplastic AFO w/hinges Level of assistance: SBA Comments: Noted improved L knee flexion, reduced tone in LLE and stability w/use of thermoplastic AFO. Pt reports he feels good in this one vs his current one.   Gait pattern: Inversion of L ankle, step through pattern, decreased arm swing- Left, decreased stride length, decreased hip/knee flexion- Left, decreased ankle dorsiflexion- Left, lateral hip instability, and lateral lean- Right Distance walked: 115' Assistive device utilized: Thermoplastic rigid AFO Level of assistance: SBA Comments: Attempted to compare a hinged AFO vs rigid, but rigid AFO too small for pt's foot so unable to adequately compare. Pt demonstrating reduced knee flexion and increased ankle inversion of LLE. Pt reported feeling more stable w/hinged brace.  Discussed planning for Tawny Fate to come to visit next Friday Tawny Fate has not confirmed this at this time).   Added to HEP (see bolded below) for improved ankle ROM, functional BLE strength and weight shift to LLE:  Standing calf stretch at wall, x60s per side. No clonus noted w/stretch and pt reported feeling tightness in his left ankle Pt performed x10 air squats and pt able to achieve full depth, but noted genu valgus of L knee. Added red theraband around distal quads to provide tactile cues for hip abduction and pt performed x10 reps w/improved knee position.  Side stepping w/red theraband around distal quads, 2x10' each direction. Increased difficulty stepping to L side  PATIENT EDUCATION: Education details: Additions to HEP  Person educated: Patient and Parent Education method: Explanation, Demonstration, and Handouts Education comprehension: verbalized understanding, returned demonstration, and needs further education  HOME EXERCISE PROGRAM: Access Code: [email or web address] URL:  [email or web address] Date: 11/01/2023 Prepared by: Camella Cave  Exercises - Staggered Sit-to-Stand  - 1 x daily - 7 x weekly - 3 sets - 10 reps - Backward Walking with Counter Support  - 1 x daily - 7 x weekly - 5 sets  Access Code: [email or web address] URL: [email or web address] Date: 11/04/2023 Prepared by: Burleigh Carp Brondon Wann  Exercises - Calf stretch  - 1 x daily - 7 x weekly - 60-90 seconds hold - Squat with Resistance at Thighs  - 1 x daily - 7 x weekly - 3 sets - 10 reps - Side Stepping with Resistance at Thighs  - 1 x daily - 7 x weekly - 3 sets - 10 reps  GOALS: Goals reviewed with patient? Yes  SHORT TERM GOALS: Target date: 11/18/23  Patient will report demonstrate independence with initial HEP in order to maintain current gains and continue to progress after physical therapy discharge.   Baseline: To be provided  Goal status: INITIAL  2.  to be assessed / LTG written Baseline: To be assessed  Goal status: INITIAL   LONG TERM GOALS: Target date: 12/16/2023  Patient will report demonstrate independence with final HEP in order to maintain current gains and continue to progress after physical therapy discharge.   Baseline: To be provided  Goal status: INITIAL  2.  Patient will improve gait speed to 0.5 m/s or greater to indicate an improvement from being a household ambulator to limited community ambulator.   Baseline: 0.34 m/s with R lofstrand crutch and L AFO (SBA) Goal status: INITIAL  3.  Patient will improve their 5x Sit to Stand score to less than 15 seconds to demonstrate a decreased risk for falls and improved LE strength.   Baseline: 18.97 seconds without UE use Goal status: INITIAL  4.  to be assessed / LTG to be written Baseline: To be assessed  Goal status: INITIAL  ASSESSMENT:  CLINICAL IMPRESSION: Emphasis of skilled PT session on trialing various AFOs to determine safest option for pt and updating HEP to work on  functional hip strength and weightbearing tolerance on LLE. Pt much more stable on LLE w/thermoplastic AFO w/lateral support and was able to facilitate L knee flexion w/this brace w/ no clonus noted. Therapist did contact Tawny Fate to come to session on 5/16, but has not heard back as of writing this note. Pt demonstrates hip adduction of LLE w/squats but did improve w/use of theraband as tactile cue for hip abduction. Continue POC.   OBJECTIVE IMPAIRMENTS: Abnormal gait, decreased activity tolerance, decreased balance, decreased endurance, decreased mobility, difficulty walking, decreased ROM, decreased strength, increased fascial restrictions, impaired flexibility, impaired sensation, impaired tone, and pain.   ACTIVITY LIMITATIONS: carrying, standing, transfers, and locomotion level  PARTICIPATION LIMITATIONS: driving, community activity, occupation, and yard work  PERSONAL FACTORS: Age, Time since onset of injury/illness/exacerbation, Transportation, and 1-2 comorbidities: see above are also affecting patient's functional outcome.   REHAB POTENTIAL: Good  CLINICAL DECISION MAKING: Evolving/moderate complexity  EVALUATION COMPLEXITY: Moderate  PLAN:  PT FREQUENCY/DURATION: 2x/week for 4 weeks and then 1x week for 2 weeks   PLANNED INTERVENTIONS: 97164- PT Re-evaluation, 97110-Therapeutic exercises, 97530- Therapeutic activity, [id number]- Neuromuscular re-education, 97535- Self Care, 54098- Manual therapy, [id number]- Gait training, [id number]- Orthotic/Prosthetic subsequent, and [id number]- Aquatic Therapy  PLAN  FOR NEXT SESSION:   Assess and write LTG, stretching program with emphasis on hamstring tightness, quad strengthening as able, could consider trial of Bioness, high intensity gait training, continue conversation about additional botox (recommended vs not pending gait), L NMR re-ed  Bioness L300 to LLE, work on tall kneel or half kneel tasks for hip/proximal strengthening as tolerated     Consider getting connected early with [facility], per chart review patient supposed to see neuro psych for emotional lability - follow up on this as needed    *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     Check all conditions that are expected to impact treatment: {Conditions expected to impact treatment:Contractures, spasticity or fracture relevant to requested treatment and Neurological condition and/or seizures   If treatment provided at initial evaluation, no treatment charged due to lack of authorization.      Megan Presti E Doyt Castellana, PT, DPT 11/04/2023, 9:38 AM

## 2023-11-08 ENCOUNTER — Encounter: Payer: Self-pay | Admitting: Physical Therapy

## 2023-11-08 ENCOUNTER — Telehealth: Payer: Self-pay | Admitting: Physical Therapy

## 2023-11-08 ENCOUNTER — Ambulatory Visit: Payer: Self-pay | Admitting: Family

## 2023-11-08 ENCOUNTER — Ambulatory Visit: Admitting: Physical Therapy

## 2023-11-08 ENCOUNTER — Encounter: Admitting: Occupational Therapy

## 2023-11-08 VITALS — BP 132/84 | HR 70

## 2023-11-08 DIAGNOSIS — M6281 Muscle weakness (generalized): Secondary | ICD-10-CM | POA: Diagnosis not present

## 2023-11-08 DIAGNOSIS — R4689 Other symptoms and signs involving appearance and behavior: Secondary | ICD-10-CM

## 2023-11-08 DIAGNOSIS — R269 Unspecified abnormalities of gait and mobility: Secondary | ICD-10-CM

## 2023-11-08 DIAGNOSIS — R2681 Unsteadiness on feet: Secondary | ICD-10-CM

## 2023-11-08 DIAGNOSIS — R2689 Other abnormalities of gait and mobility: Secondary | ICD-10-CM

## 2023-11-08 DIAGNOSIS — R278 Other lack of coordination: Secondary | ICD-10-CM

## 2023-11-08 NOTE — Telephone Encounter (Signed)
 Dr. Salli Crawley,  Lauran Pollard.  was evaluated by PT on 11/08/2023.  You will meet Sherrill later this month and wanted to reach out in advance to see if you would be able to put an order in once you see this patient. The patient would benefit from neuro psych or behavioral health evaluation for mood and behavior changes that have reported worsened since stroke. We have noticed these during the session and think it would be helpful for patient to have additional support in this area, and patient is agreeable to referral.  If you agree, please place an order in [facility] workque in [facility] or fax the order to [phone].  Thank you, Camella Cave, PT, DTaP  [facility] [address] [place], [place]  [address] Phone:  [phone] Fax:  [phone]

## 2023-11-08 NOTE — Therapy (Signed)
 OUTPATIENT PHYSICAL THERAPY NEURO TREATMENT    Patient Name: Erik Santana. MRN: [id number] DOB:[birth year], 33 y.o., male 35 Date: 11/08/2023   PCP: Lavona Pounds, NP REFERRING PROVIDER: Sterling Eisenmenger, PA-C  END OF SESSION:  PT End of Session - 11/08/23 1406     Visit Number 6    Number of Visits 11    Date for PT Re-Evaluation 12/16/23    Authorization Type Price Medicaid    PT Start Time 1406    PT Stop Time 1445    PT Time Calculation (min) 39 min    Equipment Utilized During Treatment Gait belt    Activity Tolerance Patient tolerated treatment well    Behavior During Therapy Agitated              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   Past Surgical History:  Procedure Laterality Date   ESOPHAGOGASTRODUODENOSCOPY N/A 10/18/2012   Procedure: ESOPHAGOGASTRODUODENOSCOPY (EGD);  Surgeon: Claudette Cue, MD;  Location: [facility];  Service: Endoscopy;  Laterality: N/A;   ESOPHAGOGASTRODUODENOSCOPY (EGD) WITH PROPOFOL  N/A 10/18/2012   Procedure: ESOPHAGOGASTRODUODENOSCOPY (EGD) WITH PROPOFOL ;  Surgeon: Claudette Cue, MD;  Location: [facility];  Service: Endoscopy;  Laterality: N/A;   FINGER SURGERY     IR ANGIO INTRA EXTRACRAN SEL COM CAROTID INNOMINATE BILAT MOD SED  08/23/2023   IR ANGIO VERTEBRAL SEL SUBCLAVIAN INNOMINATE UNI L MOD SED  08/23/2023   IR ANGIO VERTEBRAL SEL VERTEBRAL UNI R MOD SED  08/23/2023   OPEN REDUCTION INTERNAL FIXATION (ORIF) PROXIMAL PHALANX Right 10/12/2022   Procedure: Right ring finger middle phalanx and proximal interphalangeal joint closed reduction internal percutaneous pinning;  Surgeon: Ltanya Rummer, MD;  Location:  SURGERY CENTER;  Service: Orthopedics;  Laterality: Right;   Patient Active Problem List   Diagnosis Date Noted   Emotional lability 10/05/2023   Left foot drop  10/05/2023   Cognitive change 08/30/2023   Intraparenchymal hematoma of brain (HCC) 08/24/2023   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   Anxiety state 10/27/2012   Hematemesis 10/18/2012   Other esophagitis 10/18/2012   Mallory-Weiss tear 10/18/2012    ONSET DATE: 09/22/2023 (referral date)  REFERRING DIAG: S06.33AA (ICD-10-CM) - Intraparenchymal hematoma of brain due to trauma [facility])  THERAPY DIAG:  Unsteadiness on feet  Other abnormalities of gait and mobility  Muscle weakness (generalized)  Abnormality of gait and mobility  Rationale for Evaluation and Treatment: Rehabilitation  SUBJECTIVE:  SUBJECTIVE STATEMENT: Patient arrives to session reporting a headache that started about an hour ago. Patient reports also feeling lethargic and weak today. Patient mother reports this happens sometimes. Patient arrives to session with single lofstrand crutch and AFO doffed (mom carrying). Patient reports being in "one of those moods" today but is wanting to do therapy.   Pt accompanied by: self and mother, [name]  PERTINENT HISTORY: parenchymal hemorrhage centered at the right frontal parietal junction, intraparenchymal hematoma within the superior right frontal lobe, seizures   PAIN:  Are you having pain? 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 Yes. 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  DIAGNOSTIC FINDINGS:   MR 08/22/2023: IMPRESSION: Unchanged appearance of intraparenchymal hematoma within the superior right frontal lobe. No mass lesion.  COGNITION: Overall cognitive status: Within functional limits for tasks assessed   VITALS:  Vitals:   11/08/23 1410  BP: 132/84  Pulse: 70                                                                                                 TREATMENT:    Self Care: Vitals assessed as noted above Patient reports feeling like he has to focus to breath (oxygen levels at 99%), PT educates on use of deep breathing strategies performs 8x at start of session to help with regulation for rest of session, not reported again later in session Discuss at end of session given mood swings about neuro psych, patient has not head back about consult so PT will reach out to neurologist about upcoming visit to see a new order to be put in, patient verbalizes understanding Patient has not [facility] so PT educates on importance of if considering reaching out as well  TherAct:  Red theraband donned around knees to cue against knee valgus  Squat 1 x 10 reps with tap to mat to cue full ROM progressed to front chest hold with 2lb dowel wrapped in 2 x 5lb ankle weights (total 12lbs) 2 x 12 reps  Dead lift initially without weight x 10 reps progressed to 2lb dowel and 5lb ankle weight and progressed to 2lb dowel + 2 x 5lbs ankle weights for  total of 12lbs  for final set 3 x 12 (with progressions as noted)  RPE: 6-7/10 after all reps  Challenges with hip hinging likely due to hamstring limitations as tends to compensate through low back slightly but is able to correct overall well with a few cues   NMR: On mat with bench in front for UE support is needed (CGA), working on proximal stability, balance, control in preparation for functional tasks such as floor transfers   Static half kneel holds for stability and proximal strengthening 2 x 30 seconds bil  Half kneel working on preventing knee valgus with L leg up with D2 flexion pattern to overhead press with 4lb med ball 2 x 10 reps  Half kneel with L leg down working on D2 flexion pattern with 4lb med ball press with minA to get leg from half kneel with L  leg up to L leg back 1 x 10 reps (unable to perform second set due to LLE fatigue)   PT closely monitoring L ankle as AFO doffed for duration of session, only required foot off mat with L leg back in half kneel to prevent excessive ankle inversion, also minA to step back off mat with cues to place R leg down first to prevent awkward angle for L ankle    PATIENT EDUCATION: Education details: See self care session above Person educated: Patient and Parent Education method: Explanation, Demonstration, and Handouts Education comprehension: verbalized understanding, returned demonstration, and needs further education  HOME EXERCISE PROGRAM: Access Code: [email or web address] URL: [email or web address] Date: 11/01/2023 Prepared by: Camella Cave  Exercises - Staggered Sit-to-Stand  - 1 x daily - 7 x weekly - 3 sets - 10 reps - Backward Walking with Counter Support  - 1 x daily - 7 x weekly - 5 sets  Access Code: [email or web address] URL: [email or web address] Date: 11/04/2023 Prepared by: Burleigh Carp Plaster  Exercises - Calf stretch  - 1 x daily - 7 x weekly - 60-90 seconds hold - Squat with Resistance at Thighs  - 1 x daily - 7 x weekly -  3 sets - 10 reps - Side Stepping with Resistance at Thighs  - 1 x daily - 7 x weekly - 3 sets - 10 reps  GOALS: Goals reviewed with patient? Yes  SHORT TERM GOALS: Target date: 11/18/23  Patient will report demonstrate independence with initial HEP in order to maintain current gains and continue to progress after physical therapy discharge.   Baseline: To be provided  Goal status: INITIAL  2.  to be assessed / LTG written Baseline: To be assessed  Goal status: INITIAL   LONG TERM GOALS: Target date: 12/16/2023  Patient will report demonstrate independence with final HEP in order to maintain current gains and continue to progress after physical therapy discharge.   Baseline: To be provided  Goal status: INITIAL  2.  Patient will improve gait speed to 0.5 m/s or greater to indicate an improvement from being a household ambulator to limited community ambulator.   Baseline: 0.34 m/s with R lofstrand crutch and L AFO (SBA) Goal status: INITIAL  3.  Patient will improve their 5x Sit to Stand score to less than 15 seconds to demonstrate a decreased risk for falls and improved LE strength.   Baseline: 18.97 seconds without UE use Goal status: INITIAL  4.  to be assessed / LTG to be written Baseline: To be assessed  Goal status: INITIAL  ASSESSMENT:  CLINICAL IMPRESSION: Emphasis of skilled PT session on  working on Engineer, manufacturing tasks with lifting techniques (squat and dead lift) as well as half kneel activities. Session mildly limited by behavior swings during session as patient moderate increased agitation today. Patient still open to neuro psych consult so this PT will follow up on his behalf. Continue POC as able .   OBJECTIVE IMPAIRMENTS: Abnormal gait, decreased activity tolerance, decreased balance, decreased endurance, decreased mobility, difficulty walking, decreased ROM, decreased strength, increased fascial restrictions, impaired flexibility, impaired  sensation, impaired tone, and pain.   ACTIVITY LIMITATIONS: carrying, standing, transfers, and locomotion level  PARTICIPATION LIMITATIONS: driving, community activity, occupation, and yard work  PERSONAL FACTORS: Age, Time since onset of injury/illness/exacerbation, Transportation, and 1-2 comorbidities: see above are also affecting patient's functional outcome.   REHAB POTENTIAL: Good  CLINICAL DECISION MAKING: Evolving/moderate complexity  EVALUATION COMPLEXITY: Moderate  PLAN:  PT FREQUENCY/DURATION: 2x/week for 4 weeks and then 1x week for 2 weeks   PLANNED INTERVENTIONS: 97164- PT Re-evaluation, 97110-Therapeutic exercises, 97530- Therapeutic activity, 97112- Neuromuscular re-education, 97535- Self Care, 19147- Manual therapy, [phone]- Gait training, [phone]- Orthotic/Prosthetic subsequent, and [phone]- Aquatic Therapy  PLAN FOR NEXT SESSION:   Assess and write LTG during STG check, stretching program with emphasis on hamstring tightness, quad strengthening as able, could consider trial of Bioness, high intensity gait training, continue conversation about additional botox (recommended vs not pending gait), L NMR re-ed  AFO trial with Tawny Fate, updates on neuro psych following visit with neurologist on 5/23, review functional lift technique as appropriate (dead lift hip hinge)   For all possible CPT codes, reference the Planned Interventions line above.     Check all conditions that are expected to impact treatment: {Conditions expected to impact treatment:Contractures, spasticity or fracture relevant to requested treatment and Neurological condition and/or seizures   If treatment provided at initial evaluation, no treatment charged due to lack of authorization.      Coreen Devoid, PT, DPT 11/08/2023, 3:59 PM

## 2023-11-09 ENCOUNTER — Ambulatory Visit (INDEPENDENT_AMBULATORY_CARE_PROVIDER_SITE_OTHER): Payer: Self-pay | Admitting: Family

## 2023-11-09 ENCOUNTER — Telehealth: Payer: Self-pay | Admitting: Physical Medicine and Rehabilitation

## 2023-11-09 ENCOUNTER — Encounter: Payer: Self-pay | Admitting: Family

## 2023-11-09 VITALS — BP 124/72 | HR 67 | Temp 98.5°F | Resp 16 | Ht 75.0 in | Wt 196.4 lb

## 2023-11-09 DIAGNOSIS — Z720 Tobacco use: Secondary | ICD-10-CM

## 2023-11-09 DIAGNOSIS — R4189 Other symptoms and signs involving cognitive functions and awareness: Secondary | ICD-10-CM

## 2023-11-09 DIAGNOSIS — F410 Panic disorder [episodic paroxysmal anxiety] without agoraphobia: Secondary | ICD-10-CM

## 2023-11-09 DIAGNOSIS — I629 Nontraumatic intracranial hemorrhage, unspecified: Secondary | ICD-10-CM

## 2023-11-09 DIAGNOSIS — F411 Generalized anxiety disorder: Secondary | ICD-10-CM | POA: Diagnosis not present

## 2023-11-09 DIAGNOSIS — S0633AS Contusion and laceration of cerebrum, unspecified, with loss of consciousness status unknown, sequela: Secondary | ICD-10-CM | POA: Diagnosis not present

## 2023-11-09 DIAGNOSIS — F17210 Nicotine dependence, cigarettes, uncomplicated: Secondary | ICD-10-CM

## 2023-11-09 DIAGNOSIS — K5900 Constipation, unspecified: Secondary | ICD-10-CM

## 2023-11-09 DIAGNOSIS — Z7689 Persons encountering health services in other specified circumstances: Secondary | ICD-10-CM

## 2023-11-09 DIAGNOSIS — K226 Gastro-esophageal laceration-hemorrhage syndrome: Secondary | ICD-10-CM

## 2023-11-09 DIAGNOSIS — Z1321 Encounter for screening for nutritional disorder: Secondary | ICD-10-CM

## 2023-11-09 DIAGNOSIS — Z09 Encounter for follow-up examination after completed treatment for conditions other than malignant neoplasm: Secondary | ICD-10-CM | POA: Diagnosis not present

## 2023-11-09 DIAGNOSIS — Z139 Encounter for screening, unspecified: Secondary | ICD-10-CM

## 2023-11-09 DIAGNOSIS — R4586 Emotional lability: Secondary | ICD-10-CM

## 2023-11-09 DIAGNOSIS — F1910 Other psychoactive substance abuse, uncomplicated: Secondary | ICD-10-CM

## 2023-11-09 NOTE — Progress Notes (Signed)
 Hospital follow up patient .   Needs vitamin D  checked  he was prescribed in the hospital and really does not know what lab work they done in the hospital

## 2023-11-09 NOTE — Progress Notes (Signed)
 Subjective:    Erik Santana. - 33 y.o. male MRN [id number]  Date of birth: 03-May-1991  HPI  Erik Santana. is to establish care and hospital discharge follow-up. He is accompanied by his mother.  Current issues and/or concerns: 08/24/2023 - 09/22/2023 [facility] per DO note:  Admit date: 08/24/2023 Discharge date: 09/22/2023   Discharge Diagnoses:  Principal Problem:   Intraparenchymal hematoma of brain [facility]) Active Problems:   Cognitive change DVT prophylaxis Tobacco use History of polysubstance use Constipation Anxiety/panic attacks History of Mallory-Weiss tear   Discharged Condition: Stable   Brief HPI:   Erik Santana. is a 34 y.o. 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  Patient endorses using Ativan  and Percocet off the streets.  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  1 level home with ramped entrance.  Good family support.  Presented 08/21/2023 with acute onset of left-sided weakness as well as dizziness and headache.  He was able to call EMS himself.  EMS found him on the floor.  Blood pressure 130/81.  CT of the head showed a 3.9 x 2.4 x 4.1 cm parenchymal hemorrhage centered at the right frontal parietal junction.  No evidence of intraventricular extension or mass effect.  CT cervical spine negative.  CTA showed no intracranial large vessel occlusion or significant stenosis.  Echocardiogram with ejection fraction of 55 to 60% no wall motion abnormalities.  Admission chemistries unremarkable except glucose 157 urine drug screen positive marijuana.  MRI follow-up showed unchanged appearance of intraparenchymal hematoma within the superior right frontal lobe.  No mass lesion.  Arteriogram completed showing no gross abnormalities.  Neurology follow-up with conservative care.  Lovenox  added for DVT prophylaxis 08/23/2023.  [name] added  for tobacco use.  Blood pressure controlled he has not required Cleviprex .  Therapy evaluations completed due to patient decreased functional mobility was admitted for a comprehensive rehab program.     Hospital Course: Erik Santana. was admitted to rehab 08/24/2023 for inpatient therapies to consist of PT, ST and OT at least three hours five days a week. Past admission physiatrist, therapy team and rehab RN have worked together to provide customized collaborative inpatient rehab.  Pertaining to patient's intraparenchymal hemorrhage of the right frontal parietal junction remained stable conservative care follow-up neurology services.  Lovenox  initiated for DVT prophylaxis no bleeding episodes.  Pain management by using baclofen  as directed as well as Zanaflex  /Lidoderm  patch and tramadol  PRN.  Patient's mood and anxiety he was doing well with the addition of Depakote  use both for panic attacks as well as some myoclonus and mood stabilization as well as BuSpar  added for panic attacks with Atarax  as needed.  He did receive follow-up by neuropsychology as well as ambulatory referral obtained with behavioral health.  History of tobacco use receiving counts regards to cessation of nicotine  products as well as history of polysubstance abuse endorsing use of Ativan  and Percocet off the streets with counseling again provided.  Bouts of constipation resolved with laxative assistance.     Blood pressures were monitored on TID basis and controlled and monitored         Rehab course: During patient's stay in rehab weekly team conferences were held to monitor patient's progress, set goals and discuss barriers to discharge. At admission, patient required moderate assist supine to sit +2 physical assist sit to stand   Diet: Regular   Special Instructions: No  driving smoking or alcohol   Medications at discharge. 1.  Tylenol  as needed 2.  Baclofen  15 mg p.o. 3 times daily 3.  Antivert  25 mg p.o. 3 times  daily as needed dizziness 4.  Scopolamine  patch 1.5 mg change every 72 hours 5.  Zanaflex  4 mg 3 times daily 6.  Tramadol  50 mg every 6 hours as needed severe pain 7.  BuSpar  10 mg p.o. 3 times daily 8.  Depakote  500 mg p.o. daily 9.  Lidoderm  patch changes directed 10.  Trazodone  100 mg nightly 11.  Vitamin D  50,000 units every 7 days 12.  Atarax  25 mg 3 times daily as needed anxiety 13.  Claritin  10 mg daily  Follow-Ups: Follow up with Bea Lime, DO (Physical Medicine and Rehabilitation); Office to call for appointment   Today's office visit 11/09/2023: Patient reports feeling stable since hospital discharge. Denies red flag symptoms. Patient reports left-sided residual weakness since stroke and using an assistive device for ambulation. Patient denies additional deficits. Patient states he quit taking Depakote  several weeks ago due to it was making his hair fall out. I advised patient on the adverse risks of not taking Depakote  as prescribed and to consult with Neuropsychiatry for alternative medications which may not cause hair loss. Patient verbalized understanding. Otherwise patient doing well on medication regimen, no issues/concerns. Patient has an upcoming appointment with Neuropsychiatry which was confirmed on today from Alona Jamaica, CMA. Patient established with Physical Therapy 2 times weekly. Mother states patient elected to discontinue Occupational Therapy due to health insurance only offered 20 visits total. Mother states patient has "mood swings" and becomes easily "agitated". Patient has not established with Psychiatry as of present. Patient denies thoughts of self-harm, suicidal ideations, homicidal ideations. Patient also needs referral to Physical Medicine and Rehab. Requests Vitamin D  to be rechecked. No further issues/concerns for discussion today.   ROS per HPI   Health Maintenance:  Health Maintenance Due  Topic Date Due   Hepatitis C Screening  Never done      Past Medical History: Patient Active Problem List   Diagnosis Date Noted   Emotional lability 10/05/2023   Left foot drop 10/05/2023   Cognitive change 08/30/2023   Intraparenchymal hematoma of brain (HCC) 08/24/2023   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   Anxiety state 10/27/2012   Hematemesis 10/18/2012   Other esophagitis 10/18/2012   Mallory-Weiss tear 10/18/2012      Social History   reports that he has been smoking cigarettes. He has never used smokeless tobacco. He reports current alcohol use. He reports that he does not use drugs.   Family History  family history includes Healthy in his father and mother.   Medications: reviewed and updated   Objective:   Physical Exam BP 124/72   Pulse 67   Temp 98.5 F (36.9 C) (Oral)   Resp 16   Ht 6\' 3"  (1.905 m)   Wt 196 lb 6.4 oz (89.1 kg)   SpO2 96%   BMI 24.55 kg/m   Physical Exam HENT:     Head: Normocephalic and atraumatic.     Nose: Nose normal.     Mouth/Throat:     Mouth: Mucous membranes are moist.     Pharynx: Oropharynx is clear.  Eyes:     Extraocular Movements: Extraocular movements intact.     Conjunctiva/sclera: Conjunctivae normal.     Pupils: Pupils are equal, round, and reactive to light.  Cardiovascular:     Rate and Rhythm: Normal  rate and regular rhythm.     Pulses: Normal pulses.     Heart sounds: Normal heart sounds.  Pulmonary:     Effort: Pulmonary effort is normal.     Breath sounds: Normal breath sounds.  Musculoskeletal:        General: Normal range of motion.     Right shoulder: Normal.     Right upper arm: Normal.     Right elbow: Normal.     Right forearm: Normal.     Right wrist: Normal.     Right hand: Normal.     Cervical back: Normal, normal range of motion and neck supple.     Thoracic back: Normal.     Lumbar back: Normal.     Right hip: Normal.     Right upper leg: Normal.     Right knee: Normal.     Right lower leg: Normal.     Right  ankle: Normal.     Right foot: Normal.     Comments: Left upper extremity and left lower extremity weakness.   Neurological:     General: No focal deficit present.     Mental Status: He is alert and oriented to person, place, and time.  Psychiatric:        Mood and Affect: Mood normal.        Behavior: Behavior normal.        Assessment & Plan:  1. Encounter to establish care (Primary) - Patient presents today to establish care. During the interim follow-up with primary provider as scheduled.  - Return for annual physical examination, labs, and health maintenance. Arrive fasting meaning having no food for at least 8 hours prior to appointment. You may have only water or black coffee. Please take scheduled medications as normal.  2. Hospital discharge follow-up - Reviewed hospital course, current medications, ensured proper follow-up in place, and addressed concerns.   3. Intraparenchymal hematoma of brain, with unknown loss of consciousness status, unspecified laterality, sequela (HCC) 4. Cognitive change - Continue present management.  - Keep upcoming appointment with Neuropsychiatry.  - Referral to Physical Medicine Rehab for evaluation/management. - Ambulatory referral to Physical Medicine Rehab  5. Generalized anxiety disorder 6. Panic attacks - Patient denies thoughts of self-harm, suicidal ideations, homicidal ideations. - Continue present management. - Referral to Psychiatry for evaluation/management. - Ambulatory referral to Psychiatry  7. Constipation, unspecified constipation type 8. Mallory-Weiss tear - Continue present management.  - Referral to Gastroenterology for evaluation/management. - Ambulatory referral to Gastroenterology  9. Tobacco use 10. Polysubstance abuse (HCC) - Counseled to quit.    11. Encounter for vitamin deficiency screening - Routine screening.  - Vitamin D , 25-hydroxy    Patient was given clear instructions to go to Emergency  Department or return to medical center if symptoms don't improve, worsen, or new problems develop.The patient verbalized understanding.  I discussed the assessment and treatment plan with the patient. The patient was provided an opportunity to ask questions and all were answered. The patient agreed with the plan and demonstrated an understanding of the instructions.   The patient was advised to call back or seek an in-person evaluation if the symptoms worsen or if the condition fails to improve as anticipated.    Lavona Pounds, NP 11/09/2023, 11:28 AM Primary Care at [facility]

## 2023-11-09 NOTE — Telephone Encounter (Signed)
 Patient is having hair loss with Depakote .  Needs a call back from office to discuss this.  Also hasn't heard anything about wheelchair.  Looks like this was ordered prior to patient leaving hospital to [employer].  Patient would need to call them, the phone number is [phone].

## 2023-11-09 NOTE — Telephone Encounter (Signed)
 Order has been placed for the referral as recommendded by Dr Lynell Sar and the PT that suggested.

## 2023-11-09 NOTE — Addendum Note (Signed)
 Addended by: Elton Ham on: 11/09/2023 12:30 PM   Modules accepted: Orders

## 2023-11-10 ENCOUNTER — Telehealth: Payer: Self-pay | Admitting: Emergency Medicine

## 2023-11-10 NOTE — Telephone Encounter (Signed)
 I called and spoke with patient about vitamin D   lab that he was supposed  to get yesterday however patient left.  Patient said he will talk with his mother and see when she can bring him back up here to get his lab work done.

## 2023-11-10 NOTE — Addendum Note (Signed)
 Addended by: Jodi Munroe on: 11/10/2023 01:07 PM   Modules accepted: Orders

## 2023-11-10 NOTE — Addendum Note (Signed)
 Addended by: Bernardine Bridegroom on: 11/10/2023 03:37 PM   Modules accepted: Orders

## 2023-11-10 NOTE — Telephone Encounter (Signed)
 Return Erik Santana call.  Discharge Note was reviewed  He reports he had stopped taking the Depakote  2 weeks ago, but notice some  changes in his mood. He has resumed the Depakote . He will continue on Depakote  and this message will be forward to Dr Dorn Gaskins for her input regarding the above. He verbalizes understanding.  He seen Dr Cheryll Corti in the hospital on 08/30/2023. Will place a refrral for Dr Georgeanne King he verbalizes understanding.

## 2023-11-11 ENCOUNTER — Ambulatory Visit: Admitting: Physical Therapy

## 2023-11-11 ENCOUNTER — Telehealth: Payer: Self-pay | Admitting: Registered Nurse

## 2023-11-11 DIAGNOSIS — R269 Unspecified abnormalities of gait and mobility: Secondary | ICD-10-CM

## 2023-11-11 DIAGNOSIS — M6281 Muscle weakness (generalized): Secondary | ICD-10-CM

## 2023-11-11 DIAGNOSIS — R2681 Unsteadiness on feet: Secondary | ICD-10-CM

## 2023-11-11 DIAGNOSIS — R278 Other lack of coordination: Secondary | ICD-10-CM

## 2023-11-11 DIAGNOSIS — R2689 Other abnormalities of gait and mobility: Secondary | ICD-10-CM

## 2023-11-11 NOTE — Therapy (Signed)
 OUTPATIENT PHYSICAL THERAPY NEURO TREATMENT    Patient Name: Erik Santana. MRN: [id number] DOB:05-10-1991, 33 y.o., male 77 Date: 11/11/2023   PCP: Lavona Pounds, NP REFERRING PROVIDER: Sterling Eisenmenger, PA-C  END OF SESSION:  PT End of Session - 11/11/23 0854     Visit Number 7    Number of Visits 11    Date for PT Re-Evaluation 12/16/23    Authorization Type [place] Medicaid    PT Start Time 0850    PT Stop Time 0935    PT Time Calculation (min) 45 min    Equipment Utilized During Treatment --    Activity Tolerance Patient tolerated treatment well    Behavior During Therapy [facility] for tasks assessed/performed              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   Past Surgical History:  Procedure Laterality Date   ESOPHAGOGASTRODUODENOSCOPY N/A 10/18/2012   Procedure: ESOPHAGOGASTRODUODENOSCOPY (EGD);  Surgeon: Claudette Cue, MD;  Location: [facility];  Service: Endoscopy;  Laterality: N/A;   ESOPHAGOGASTRODUODENOSCOPY (EGD) WITH PROPOFOL  N/A 10/18/2012   Procedure: ESOPHAGOGASTRODUODENOSCOPY (EGD) WITH PROPOFOL ;  Surgeon: Claudette Cue, MD;  Location: [facility];  Service: Endoscopy;  Laterality: N/A;   FINGER SURGERY     IR ANGIO INTRA EXTRACRAN SEL COM CAROTID INNOMINATE BILAT MOD SED  08/23/2023   IR ANGIO VERTEBRAL SEL SUBCLAVIAN INNOMINATE UNI L MOD SED  08/23/2023   IR ANGIO VERTEBRAL SEL VERTEBRAL UNI R MOD SED  08/23/2023   OPEN REDUCTION INTERNAL FIXATION (ORIF) PROXIMAL PHALANX Right 10/12/2022   Procedure: Right ring finger middle phalanx and proximal interphalangeal joint closed reduction internal percutaneous pinning;  Surgeon: Ltanya Rummer, MD;  Location: [facility];  Service: Orthopedics;  Laterality: Right;   Patient Active Problem List   Diagnosis Date Noted   Emotional lability 10/05/2023    Left foot drop 10/05/2023   Cognitive change 08/30/2023   Intraparenchymal hematoma of brain (HCC) 08/24/2023   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   Anxiety state 10/27/2012   Hematemesis 10/18/2012   Other esophagitis 10/18/2012   Mallory-Weiss tear 10/18/2012    ONSET DATE: 09/22/2023 (referral date)  REFERRING DIAG: S06.33AA (ICD-10-CM) - Intraparenchymal hematoma of brain due to trauma [facility])  THERAPY DIAG:  Abnormality of gait and mobility  Other lack of coordination  Muscle weakness (generalized)  Unsteadiness on feet  Other abnormalities of gait and mobility  Rationale for Evaluation and Treatment: Rehabilitation  SUBJECTIVE:  SUBJECTIVE STATEMENT: Patient arrives to session w/no brace on, carrying AFO and crutch. Denies pain and acute changes. [name] from [place] present to assess for new AFO.   Pt accompanied by: self and mother, [name]  PERTINENT HISTORY: parenchymal hemorrhage centered at the right frontal parietal junction, intraparenchymal hematoma within the superior right frontal lobe, seizures   PAIN:  Are you having pain? 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 Yes. 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  DIAGNOSTIC FINDINGS:   MR 08/22/2023: IMPRESSION: Unchanged appearance of intraparenchymal hematoma within the superior right frontal lobe. No mass lesion.  COGNITION: Overall cognitive status: Within functional limits for tasks assessed   VITALS:  There were no vitals filed for this visit.                                                                                                TREATMENT:    TherAct: With [name] present, donned L thermoplastic hinged AFO on LLE and had pt ambulate without AD. Noted improved stance time on LLE, decreased tone, proper ankle stability and mild knee extension thrust in stance (per mom, pt walked similar to this before). Pt to go to [facility] following PT session to be casted for thermoplastic AFO. For now, pt to have rigid brace w/o hinges and Tawny Fate reports she can hinge the brace later if needed. Provided Tawny Fate w/MD note stating medical necessity for new AFO and pt's facesheet.    NMR: Had pt take shoes off to assess ankle position with movement and the following were performed for improved functional BLE strength, improved ankle DF ROM, pain modulation and proper body mechanics: Air squats, x8 reps. Noted pt pitching forward on to toes and rounding back, so cued to maintain weight on heels and maintain upright chest. Noted L heel not making contact with ground throughout.  Progressed to goblet squats (holding 15# KB) x8 reps. Pt w/improved upright posture but still tipping forward on L foot  With heels elevated on 1/2" foam, x5 goblet squats for improved ankle ROM and proper squat technique. Pt reported not feeling a difference w/movement, so progressed to heels elevated on 1.5" foam and performing 7 goblet squats. W/elevated height, pt able to  perform proper squat but did set off pt's tone in LLE. Pt reporting pain in  L foot following activity.  Performed DF mob to LLE followed by eversion stretch. Educated pt on how to perform these at home with his gait belt and had pt demonstrate in clinic, which pt performed well.  Worked on deadlifts w/15# surge, x15 reps. Mod multimodal cues to facilitate hip hinge and maintain neutral spine position. Pt did well w/cues to look ahead. Noted genu valgus of L knee and ER of L foot throughout.    PATIENT EDUCATION: Education details: Ankle stretches w/belt at home, plan for AFO, copy of pro-bono clinics  Person educated: Patient and Parent Education method: Explanation, Demonstration, and Handouts Education comprehension: verbalized understanding, returned demonstration, and needs further education  HOME EXERCISE PROGRAM: Access Code: [email or web address] URL: [email or web address] Date: 11/01/2023 Prepared by: Camella Cave  Exercises - Staggered Sit-to-Stand  - 1 x daily - 7 x weekly - 3 sets - 10 reps - Backward Walking with Counter Support  - 1 x daily - 7 x weekly - 5 sets  Access Code: [email or web address] URL: [email or web address] Date: 11/04/2023 Prepared by: Burleigh Carp Morenike Cuff  Exercises - Calf stretch  - 1 x daily - 7 x weekly - 60-90 seconds hold - Squat with Resistance at Thighs  - 1 x daily - 7 x weekly - 3 sets - 10 reps - Side Stepping with Resistance at Thighs  - 1 x daily - 7 x weekly - 3 sets - 10 reps  GOALS: Goals reviewed with patient? Yes  SHORT TERM GOALS: Target date: 11/18/23  Patient will report demonstrate independence with initial HEP in order to maintain current gains and continue to progress after physical therapy discharge.   Baseline: To be provided  Goal status: INITIAL  2.  to be assessed / LTG written Baseline: To be assessed  Goal status: INITIAL   LONG TERM GOALS: Target date: 12/16/2023  Patient will report demonstrate independence  with final HEP in order to maintain current gains and continue to progress after physical therapy discharge.   Baseline: To be provided  Goal status: INITIAL  2.  Patient will improve gait speed to 0.5 m/s or greater to indicate an improvement from being a household ambulator to limited community ambulator.   Baseline: 0.34 m/s with R lofstrand crutch and L AFO (SBA) Goal status: INITIAL  3.  Patient will improve their 5x Sit to Stand score to less than 15 seconds to demonstrate a decreased risk for falls and improved LE strength.   Baseline: 18.97 seconds without UE use Goal status: INITIAL  4.  to be assessed / LTG to be written Baseline: To be assessed  Goal status: INITIAL  ASSESSMENT:  CLINICAL IMPRESSION: Emphasis of skilled PT session on assessing need for new AFO, functional BLE strength and mobility of L ankle. Tawny Fate present for session and in agreement that pt could benefit from thermoplastic AFO, as his current AFO is not stable and promotes his tone which causes pain in L foot. Remainder of session spent on functional lifting w/shoes off to assess L ankle position. Pt most challenged by heel elevated goblet squats, but did demonstrate reduced genu valgus today compared to previous sessions. Continue POC as able .   OBJECTIVE IMPAIRMENTS: Abnormal gait, decreased activity tolerance, decreased balance, decreased endurance, decreased mobility, difficulty walking, decreased ROM, decreased strength, increased fascial restrictions, impaired flexibility, impaired sensation, impaired tone, and pain.   ACTIVITY LIMITATIONS: carrying, standing, transfers, and locomotion level  PARTICIPATION LIMITATIONS: driving, community activity, occupation, and yard work  PERSONAL FACTORS: Age,  Time since onset of injury/illness/exacerbation, Transportation, and 1-2 comorbidities: see above are also affecting patient's functional outcome.   REHAB POTENTIAL: Good  CLINICAL DECISION MAKING:  Evolving/moderate complexity  EVALUATION COMPLEXITY: Moderate  PLAN:  PT FREQUENCY/DURATION: 2x/week for 4 weeks and then 1x week for 2 weeks   PLANNED INTERVENTIONS: 97164- PT Re-evaluation, 97110-Therapeutic exercises, 97530- Therapeutic activity, 97112- Neuromuscular re-education, 97535- Self Care, 04540- Manual therapy, [phone]- Gait training, [phone]- Orthotic/Prosthetic subsequent, and [phone]- Aquatic Therapy  PLAN FOR NEXT SESSION:   Assess and write LTG during STG check, stretching program with emphasis on hamstring tightness, quad strengthening as able, could consider trial of Bioness, high intensity gait training, continue conversation about additional botox (recommended vs not pending gait), L NMR re-ed  updates on neuro psych following visit with neurologist on 5/23, review functional lift technique as appropriate (dead lift hip hinge)   For all possible CPT codes, reference the Planned Interventions line above.     Check all conditions that are expected to impact treatment: {Conditions expected to impact treatment:Contractures, spasticity or fracture relevant to requested treatment and Neurological condition and/or seizures   If treatment provided at initial evaluation, no treatment charged due to lack of authorization.      Rafia Shedden E Bhakti Labella, PT, DPT 11/11/2023, 10:03 AM

## 2023-11-11 NOTE — Telephone Encounter (Signed)
 Dr Dorn Gaskins can you review my note on 11/09/2023. I will await your recommendation.

## 2023-11-14 ENCOUNTER — Telehealth: Payer: Self-pay | Admitting: Registered Nurse

## 2023-11-14 DIAGNOSIS — R4586 Emotional lability: Secondary | ICD-10-CM

## 2023-11-14 NOTE — Telephone Encounter (Signed)
 Dr Dorn Gaskins recommended Psychiatry referral for medication regimen. Psychiatry referral placed.

## 2023-11-14 NOTE — Telephone Encounter (Signed)
 You did everything I would do; Psych referral is the best call here as his anxiety was pre-existing his injury and they will be better able to help him find a stable regimen. No other notes. Thank you Emilia Harbour!

## 2023-11-15 ENCOUNTER — Encounter: Admitting: Occupational Therapy

## 2023-11-15 ENCOUNTER — Ambulatory Visit: Admitting: Physical Therapy

## 2023-11-15 DIAGNOSIS — R278 Other lack of coordination: Secondary | ICD-10-CM

## 2023-11-15 DIAGNOSIS — R2689 Other abnormalities of gait and mobility: Secondary | ICD-10-CM

## 2023-11-15 DIAGNOSIS — M6281 Muscle weakness (generalized): Secondary | ICD-10-CM | POA: Diagnosis not present

## 2023-11-15 NOTE — Therapy (Signed)
 OUTPATIENT PHYSICAL THERAPY NEURO TREATMENT    Patient Name: Erik Santana. MRN: [id number] DOB:[birth year], 33 y.o., male 17 Date: 11/15/2023   PCP: Erik Pounds, NP REFERRING PROVIDER: Sterling Eisenmenger, PA-C  END OF SESSION:  PT End of Session - 11/15/23 1456     Visit Number 8    Number of Visits 11    Date for PT Re-Evaluation 12/16/23    Authorization Type [place] Medicaid    PT Start Time 1454   Pt arrived late   PT Stop Time 1532    PT Time Calculation (min) 38 min    Equipment Utilized During Treatment Gait belt    Activity Tolerance Patient tolerated treatment well    Behavior During Therapy WFL for tasks assessed/performed              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   Past Surgical History:  Procedure Laterality Date   ESOPHAGOGASTRODUODENOSCOPY N/A 10/18/2012   Procedure: ESOPHAGOGASTRODUODENOSCOPY (EGD);  Surgeon: Erik Cue, MD;  Location: [facility];  Service: Endoscopy;  Laterality: N/A;   ESOPHAGOGASTRODUODENOSCOPY (EGD) WITH PROPOFOL  N/A 10/18/2012   Procedure: ESOPHAGOGASTRODUODENOSCOPY (EGD) WITH PROPOFOL ;  Surgeon: Erik Cue, MD;  Location: [facility];  Service: Endoscopy;  Laterality: N/A;   FINGER SURGERY     IR ANGIO INTRA EXTRACRAN SEL COM CAROTID INNOMINATE BILAT MOD SED  08/23/2023   IR ANGIO VERTEBRAL SEL SUBCLAVIAN INNOMINATE UNI L MOD SED  08/23/2023   IR ANGIO VERTEBRAL SEL VERTEBRAL UNI R MOD SED  08/23/2023   OPEN REDUCTION INTERNAL FIXATION (ORIF) PROXIMAL PHALANX Right 10/12/2022   Procedure: Right ring finger middle phalanx and proximal interphalangeal joint closed reduction internal percutaneous pinning;  Surgeon: Erik Rummer, MD;  Location: [facility];  Service: Orthopedics;  Laterality: Right;   Patient Active Problem List   Diagnosis Date Noted   Emotional  lability 10/05/2023   Left foot drop 10/05/2023   Cognitive change 08/30/2023   Intraparenchymal hematoma of brain (HCC) 08/24/2023   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   Anxiety state 10/27/2012   Hematemesis 10/18/2012   Other esophagitis 10/18/2012   Mallory-Weiss tear 10/18/2012    ONSET DATE: 09/22/2023 (referral date)  REFERRING DIAG: S06.33AA (ICD-10-CM) - Intraparenchymal hematoma of brain due to trauma [facility])  THERAPY DIAG:  Muscle weakness (generalized)  Other abnormalities of gait and mobility  Other lack of coordination  Rationale for Evaluation and Treatment: Rehabilitation  SUBJECTIVE:  SUBJECTIVE STATEMENT: Patient arrives to session w/no brace on and using R crutch. Denies pain and acute changes. Was casted for new AFO, should be able to pick it up in 3 weeks.   Pt accompanied by: self and mother, Erik Santana  PERTINENT HISTORY: parenchymal hemorrhage centered at the right frontal parietal junction, intraparenchymal hematoma within the superior right frontal lobe, seizures   PAIN:  Are you having pain? 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 Yes. 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  DIAGNOSTIC FINDINGS:   MR 08/22/2023: IMPRESSION: Unchanged appearance of intraparenchymal hematoma within the superior right frontal lobe. No mass lesion.  COGNITION: Overall cognitive status: Within functional limits for tasks assessed   VITALS:  There were no vitals filed for this visit.                                                                                                TREATMENT:    Self-care Discussed PT POC moving forward and pt in agreement to continue at a frequency of 1x/week to conserve visits.  Contacted Erik Santana) to inquire when pt can pick up AFO, as pt trying to coordinate w/PT session due to travel.    NMR: The following treadmill training was completed for aerobic/neural priming, endurance, step clearance and gait speed.  - Warmup: 3:00 up to 1.1 mph w/no AFO donned. Pt demonstrated significant IR of LLE this date and reports his tone is worse currently  - HIIT: 3:00 30sec ON/OFF alternating large blue theraball kicks and normal gait at 1.2 mph. Pt very challenged by maintaining rhythm of kicks and noted worsening of LLE hip IR w/movement so halted activity and donned R hinged thermoplastic AFO to assist w/ankle position  -3:00 at 1.2 mph w/AFO donned, with single 30 sec on/off w/ball kicks. Noted improved ankle position but continued IR of LLE  At mat table, sit to stands w/hip adduction squeeze (using yoga block) x10 reps for improved hip adductor strength and reduction of IR tone. Pt very challenged by activity due to IR weakness, requiring cues for proper foot position in order to facilitate adductors rather than use knees to hold block. Encouraged pt to work on this at home using tissue box, hard pillow or toilet paper roll.     PATIENT EDUCATION: Education details:  Work on  sit to stands w/Adductor focus at home, PT POC Person educated: Patient and Parent Education method: Medical illustrator Education comprehension: verbalized understanding, returned demonstration, and needs further education  HOME EXERCISE PROGRAM: Access Code: [email or web address] URL: [email or web address] Date: 11/01/2023 Prepared by: Camella Cave  Exercises - Staggered Sit-to-Stand  - 1 x daily - 7 x weekly - 3 sets - 10 reps - Backward Walking with Counter Support  - 1 x daily - 7 x weekly - 5 sets  Access Code: [email or web address] URL: [email or web address] Date: 11/04/2023 Prepared by: Burleigh Carp Zachory Mangual  Exercises - Calf stretch  - 1 x daily - 7 x weekly - 60-90 seconds hold - Squat with Resistance at Thighs  - 1 x daily - 7 x weekly - 3 sets - 10 reps - Side Stepping with Resistance at Thighs  - 1 x daily - 7 x weekly - 3 sets - 10 reps  GOALS: Goals reviewed with patient? Yes  SHORT TERM GOALS: Target date: 11/18/23  Patient will report demonstrate independence with initial HEP in order to maintain current gains and continue to progress after physical therapy discharge.   Baseline: To be provided  Goal status: INITIAL  2.  to be assessed / LTG written Baseline: To be assessed  Goal status: INITIAL   LONG TERM GOALS: Target date: 12/16/2023  Patient will report demonstrate independence with final HEP in order to maintain current gains and continue to progress after physical therapy discharge.   Baseline: To be provided  Goal status: INITIAL  2.  Patient will improve gait speed to 0.5 m/s or greater to indicate an improvement from being a household ambulator to limited community ambulator.   Baseline: 0.34 m/s with R lofstrand crutch and L AFO (SBA) Goal status: INITIAL  3.  Patient will improve their 5x Sit to Stand score to less than 15 seconds to demonstrate a decreased risk for falls and improved LE strength.   Baseline: 18.97 seconds without  UE use Goal status: INITIAL  4.  to be assessed / LTG to be written Baseline: To be assessed  Goal status: INITIAL  ASSESSMENT:  CLINICAL IMPRESSION: Session limited due to pt's late arrival. Emphasis of skilled PT session on gait training for improved step clearance/length, endurance and hip adductor strength for reduced tone. Pt tolerated session well but had increased tone in LLE, resulting in excessive hip IR w/gait. Donned thermoplastic hinged AFO to LLE which did reduce IR, but pt still present. Added hip adductor squeezes to HEP to assist w/this, which was challenging for pt.  Continue POC as able .   OBJECTIVE IMPAIRMENTS: Abnormal gait, decreased activity tolerance, decreased balance, decreased endurance, decreased mobility, difficulty walking, decreased ROM, decreased strength, increased fascial restrictions, impaired flexibility, impaired sensation, impaired tone, and pain.   ACTIVITY LIMITATIONS: carrying, standing, transfers, and locomotion level  PARTICIPATION LIMITATIONS: driving, community activity, occupation, and yard work  PERSONAL FACTORS: Age, Time since onset of injury/illness/exacerbation, Transportation, and 1-2 comorbidities: see above are also affecting patient's functional outcome.   REHAB POTENTIAL: Good  CLINICAL DECISION MAKING: Evolving/moderate complexity  EVALUATION COMPLEXITY: Moderate  PLAN:  PT FREQUENCY/DURATION: 2x/week for 4 weeks and then 1x week for 2 weeks   PLANNED INTERVENTIONS: 97164- PT Re-evaluation, 97110-Therapeutic exercises, 97530- Therapeutic activity, [id number]- Neuromuscular re-education, 97535- Self Care, 54098- Manual therapy, [id number]- Gait training, [id number]- Orthotic/Prosthetic subsequent, and [id number]- Aquatic Therapy  PLAN FOR NEXT SESSION: TM and elliptical. Don rigid AFO.  Assess and write LTG  during STG check, stretching program with emphasis on hamstring tightness, quad strengthening as able, could consider trial of  Bioness, high intensity gait training, continue conversation about additional botox (recommended vs not pending gait), L NMR re-ed  updates on neuro psych following visit with neurologist on 5/23, review functional lift technique as appropriate (dead lift hip hinge)   For all possible CPT codes, reference the Planned Interventions line above.     Check all conditions that are expected to impact treatment: {Conditions expected to impact treatment:Contractures, spasticity or fracture relevant to requested treatment and Neurological condition and/or seizures   If treatment provided at initial evaluation, no treatment charged due to lack of authorization.      Saharra Santo E Kerisha Goughnour, PT, DPT 11/15/2023, 3:33 PM

## 2023-11-16 ENCOUNTER — Encounter: Payer: Self-pay | Admitting: Diagnostic Neuroimaging

## 2023-11-16 ENCOUNTER — Ambulatory Visit: Payer: MEDICAID | Admitting: Diagnostic Neuroimaging

## 2023-11-16 ENCOUNTER — Ambulatory Visit: Payer: Self-pay | Admitting: Physical Therapy

## 2023-11-16 VITALS — BP 125/86 | HR 96 | Ht 75.0 in | Wt 194.0 lb

## 2023-11-16 DIAGNOSIS — I610 Nontraumatic intracerebral hemorrhage in hemisphere, subcortical: Secondary | ICD-10-CM

## 2023-11-16 DIAGNOSIS — R278 Other lack of coordination: Secondary | ICD-10-CM

## 2023-11-16 DIAGNOSIS — M6281 Muscle weakness (generalized): Secondary | ICD-10-CM

## 2023-11-16 DIAGNOSIS — R2689 Other abnormalities of gait and mobility: Secondary | ICD-10-CM

## 2023-11-16 NOTE — Therapy (Signed)
 OUTPATIENT PHYSICAL THERAPY NEURO TREATMENT    Patient Name: Erik Santana. MRN: [id number] DOB:27-Jun-1991, 33 y.o., male 85 Date: 11/16/2023   PCP: Lavona Pounds, NP REFERRING PROVIDER: Sterling Eisenmenger, PA-C  END OF SESSION:  PT End of Session - 11/16/23 0859     Visit Number 9    Number of Visits 11    Date for PT Re-Evaluation 12/16/23    Authorization Type [place] Medicaid    PT Start Time 0855   Pt arrived late   PT Stop Time 0936    PT Time Calculation (min) 41 min    Equipment Utilized During Treatment Gait belt    Activity Tolerance Patient tolerated treatment well    Behavior During Therapy [facility] for tasks assessed/performed               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   Past Surgical History:  Procedure Laterality Date   ESOPHAGOGASTRODUODENOSCOPY N/A 10/18/2012   Procedure: ESOPHAGOGASTRODUODENOSCOPY (EGD);  Surgeon: Claudette Cue, MD;  Location: [facility];  Service: Endoscopy;  Laterality: N/A;   ESOPHAGOGASTRODUODENOSCOPY (EGD) WITH PROPOFOL  N/A 10/18/2012   Procedure: ESOPHAGOGASTRODUODENOSCOPY (EGD) WITH PROPOFOL ;  Surgeon: Claudette Cue, MD;  Location: [facility];  Service: Endoscopy;  Laterality: N/A;   FINGER SURGERY     IR ANGIO INTRA EXTRACRAN SEL COM CAROTID INNOMINATE BILAT MOD SED  08/23/2023   IR ANGIO VERTEBRAL SEL SUBCLAVIAN INNOMINATE UNI L MOD SED  08/23/2023   IR ANGIO VERTEBRAL SEL VERTEBRAL UNI R MOD SED  08/23/2023   OPEN REDUCTION INTERNAL FIXATION (ORIF) PROXIMAL PHALANX Right 10/12/2022   Procedure: Right ring finger middle phalanx and proximal interphalangeal joint closed reduction internal percutaneous pinning;  Surgeon: Ltanya Rummer, MD;  Location: [facility];  Service: Orthopedics;  Laterality: Right;   Patient Active Problem List   Diagnosis Date Noted   Emotional  lability 10/05/2023   Left foot drop 10/05/2023   Cognitive change 08/30/2023   Intraparenchymal hematoma of brain (HCC) 08/24/2023   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   Anxiety state 10/27/2012   Hematemesis 10/18/2012   Other esophagitis 10/18/2012   Mallory-Weiss tear 10/18/2012    ONSET DATE: 09/22/2023 (referral date)  REFERRING DIAG: S06.33AA (ICD-10-CM) - Intraparenchymal hematoma of brain due to trauma [facility])  THERAPY DIAG:  Other abnormalities of gait and mobility  Muscle weakness (generalized)  Other lack of coordination  Rationale for Evaluation and Treatment: Rehabilitation  SUBJECTIVE:  SUBJECTIVE STATEMENT: Patient arrives to session w/no brace on and using R crutch. Reports his leg felt better after session yesterday. Has some tone this morning but no pain.   Pt accompanied by: self and mother, [name]  PERTINENT HISTORY: parenchymal hemorrhage centered at the right frontal parietal junction, intraparenchymal hematoma within the superior right frontal lobe, seizures   PAIN:  Are you having pain? 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 Yes. 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  DIAGNOSTIC FINDINGS:   MR 08/22/2023: IMPRESSION: Unchanged appearance of intraparenchymal hematoma within the superior right frontal lobe. No mass lesion.  COGNITION: Overall cognitive status: Within functional limits for tasks assessed   VITALS:  There were no vitals filed for this visit.                                                                                                TREATMENT:     NMR: The following treadmill training was completed for aerobic/neural priming, endurance, step clearance and gait speed.  - Warmup: 3:00 up to 1.3 mph w/3% incline w/thermoplastic AFO donned. Pt w/increase in L hip IR w/fatigue  - HIIT: 5:00 30sec ON/OFF alternating large blue theraball kicks and normal gait at 1.3 mph. Improved rhythm of kicks this date and noted decreased IR w/increased repetition of kicks. Min cues to maintain hips close to hand rests to facilitate upright posture, as pt tends to flex forward.   -1:00 at 1.5 mph    Farmer's carries w/20# KB in single UE support, 3x25' each UE. Noted significant compensated trendelenburg when holding weight on R side. Decreased IR of LLE when holding weight on L side  Quadruped bird dogs w/min A at pelvis for stability, x4 reps per side moving legs only and x6 reps per side performing full movement. Pt extremely challenged by this, w/significant tone occurring in LLE both when stabilizing on LLE and when moving LLE. With repetition, tone did reduce and pt able to extend LLE fully w/movement.   Self-care Pt inquiring about where he could do yoga, so provided resources for [facility]   PATIENT EDUCATION: Education details: Work on sit to stands w/Adductor focus at home, continue HEP Person educated: Patient and Parent Education method: Software engineer Education comprehension: verbalized understanding, returned demonstration, and  needs further education  HOME EXERCISE PROGRAM: Access Code: [email or web address] URL: [email or web address] Date: 11/01/2023 Prepared by: Camella Cave  Exercises - Staggered Sit-to-Stand  - 1 x daily - 7 x weekly - 3 sets - 10 reps - Backward Walking with Counter Support  - 1 x daily - 7 x weekly - 5 sets  Access Code: [email or web address] URL: [email or web address] Date: 11/04/2023 Prepared by: Burleigh Carp Shelanda Duvall  Exercises - Calf stretch  - 1 x daily - 7 x weekly - 60-90 seconds hold - Squat with Resistance at Thighs  - 1 x daily - 7 x weekly - 3 sets - 10 reps - Side Stepping with Resistance at Thighs  - 1 x daily - 7 x weekly - 3 sets - 10 reps  GOALS: Goals reviewed with patient? Yes  SHORT TERM GOALS: Target date: 11/18/23  Patient will report demonstrate independence with initial HEP in order to maintain current gains and continue to progress after physical therapy discharge.   Baseline: To be provided  Goal status: INITIAL  2.  to be assessed / LTG written Baseline: To be assessed  Goal status: INITIAL   LONG TERM GOALS: Target date: 12/16/2023  Patient will report demonstrate independence with final HEP in order to maintain current gains and continue to progress after physical therapy discharge.   Baseline: To be provided  Goal status: INITIAL  2.  Patient will improve gait speed to 0.5 m/s or greater to indicate an improvement from being a household ambulator to limited community ambulator.   Baseline: 0.34 m/s with R lofstrand crutch and L AFO (SBA) Goal status: INITIAL  3.  Patient will improve their 5x Sit to Stand score to less than 15 seconds to demonstrate a decreased risk for falls and improved LE strength.   Baseline: 18.97 seconds without UE use Goal status: INITIAL  4.  to be assessed / LTG to be written Baseline: To be assessed  Goal status:  INITIAL  ASSESSMENT:  CLINICAL IMPRESSION: Session limited due to pt's late arrival. Emphasis of skilled PT session on gait training for improved step clearance/length, endurance and hip abductor strength. Pt w/improved performance on TM this date but continues to demonstrate significant hip IR on LLE. Pt did reduce IR when holding weight on L side, so ended session working on glute med which was very challenging for pt. Pt will continue to benefit from PT to work on hip adductor mobility and hip abductor strength. Continue POC as able .   OBJECTIVE IMPAIRMENTS: Abnormal gait, decreased activity tolerance, decreased balance, decreased endurance, decreased mobility, difficulty walking, decreased ROM, decreased strength, increased fascial restrictions, impaired flexibility, impaired sensation, impaired tone, and pain.   ACTIVITY LIMITATIONS: carrying, standing, transfers, and locomotion level  PARTICIPATION LIMITATIONS: driving, community activity, occupation, and yard work  PERSONAL FACTORS: Age, Time since onset of injury/illness/exacerbation, Transportation, and 1-2 comorbidities: see above are also affecting patient's functional outcome.   REHAB POTENTIAL: Good  CLINICAL DECISION MAKING: Evolving/moderate complexity  EVALUATION COMPLEXITY: Moderate  PLAN:  PT FREQUENCY/DURATION: 2x/week for 4 weeks and then 1x week for 2 weeks   PLANNED INTERVENTIONS: 97164- PT Re-evaluation, 97110-Therapeutic exercises, 97530- Therapeutic activity, [id number]- Neuromuscular re-education, 97535- Self Care, [id number]- Manual therapy, [id number]- Gait training, [id number]- Orthotic/Prosthetic subsequent, and [id number]- Aquatic Therapy  PLAN FOR NEXT SESSION: 10th visit PN and STG check. TM and elliptical. Don rigid AFO.  stretching program with emphasis on hamstring tightness, quad strengthening as able, could consider trial of Bioness, high intensity gait training,  continue conversation about additional botox (recommended vs  not pending gait), L NMR re-ed  review functional lift technique as appropriate (dead lift hip hinge)   For all possible CPT codes, reference the Planned Interventions line above.     Check all conditions that are expected to impact treatment: {Conditions expected to impact treatment:Contractures, spasticity or fracture relevant to requested treatment and Neurological condition and/or seizures   If treatment provided at initial evaluation, no treatment charged due to lack of authorization.      Johnthan Axtman E Dominick Morella, PT, DPT 11/16/2023, 9:43 AM

## 2023-11-16 NOTE — Progress Notes (Signed)
 [facility]  PATIENT: Erik Santana. DOB: [birth year]  REFERRING CLINICIAN: Sterling Eisenmenger, PA-C HISTORY FROM: patient  REASON FOR VISIT: new consult   HISTORICAL  CHIEF COMPLAINT:  Chief Complaint  Patient presents with   New Patient (Initial Visit)    Pt with mom, rm 7. Here today following up from stroke in feb. Went to inpatient rehab was dc. Pt working with outpt therapy, PT/OT. Overall stable doing well. Anxiety/agitation has been increased and a referral to psychiatrist for behavior/mood problems. Pt scheduled 7/9    HISTORY OF PRESENT ILLNESS:   33 year old male here for evaluation of intracerebral hemorrhage.  08/18/2023 patient was at home woke up feeling okay.  He went to use the bathroom when all of a sudden he felt headache, generalized weakness.  He tried to get up and fell down because of left-sided weakness.  Called for help and EMS was called to scene.  Patient was taken to the hospital and diagnosed with right frontoparietal subcortical intracerebral hemorrhage.  CT angiogram, CT venogram, MRI of the brain were obtained but no specific cause was found.  He followed up with diagnostic cerebral angiogram with no underlying vascular malformation was found.  He was transferred to inpatient rehab and now is back at home.  Symptoms are gradually improving over time.  About 1 week prior to symptoms patient was having a severe upper respiratory infection with flulike symptoms.  He did take some over-the-counter cold medications.  Patient had been intermittently using opiates for back pain and cannabis recreationally.   REVIEW OF SYSTEMS: Full 14 system review of systems performed and negative with exception of: as per HPI.  ALLERGIES: Allergies  Allergen Reactions   Latex Hives   Nsaids Other (See Comments)    History of a Mallory-Weiss tear (had surgery to fix this 10+ years ago)    HOME MEDICATIONS: Outpatient Medications Prior to Visit   Medication Sig Dispense Refill   acetaminophen  (TYLENOL ) 325 MG tablet Take 1-2 tablets (325-650 mg total) by mouth every 6 (six) hours as needed for mild pain (pain score 1-3), headache or moderate pain (pain score 4-6) (325 mg for pain 1-3, 6500 mg for pain 4-6).     baclofen  (LIORESAL ) 10 MG tablet Take 1.5 tablets (15 mg total) by mouth 3 (three) times daily. 90 tablet 3   busPIRone  (BUSPAR ) 10 MG tablet Take 1.5 tablets (15 mg total) by mouth 3 (three) times daily. 135 tablet 3   divalproex  (DEPAKOTE  ER) 250 MG 24 hr tablet Take 1 tablet (250 mg total) by mouth daily. 30 tablet 0   hydrOXYzine  (ATARAX ) 25 MG tablet Take 1 tablet (25 mg total) by mouth 3 (three) times daily as needed for anxiety. 30 tablet 3   lidocaine  (LIDODERM ) 5 % Place 1 patch onto the skin every 12 (twelve) hours as needed. Remove & Discard patch within 12 hours or as directed by MD 30 patch 3   loratadine  (CLARITIN ) 10 MG tablet Take 1 tablet (10 mg total) by mouth daily. 30 tablet 0   meclizine  (ANTIVERT ) 25 MG tablet Take 1 tablet (25 mg total) by mouth 3 (three) times daily as needed for dizziness. 30 tablet 3   scopolamine  (TRANSDERM-SCOP) 1 MG/3DAYS Place 1 patch (1.5 mg total) onto the skin every 3 (three) days. 10 patch 3   tiZANidine  (ZANAFLEX ) 4 MG tablet Take 1 tablet (4 mg total) by mouth every 8 (eight) hours as needed for muscle spasms. 90 tablet 3  traMADol  (ULTRAM ) 50 MG tablet Take 1 tablet (50 mg total) by mouth 2 (two) times daily as needed. 10 tablet 1   traZODone  (DESYREL ) 100 MG tablet Take 0.5-1 tablets (50-100 mg total) by mouth at bedtime as needed for sleep. 30 tablet 3   Vitamin D , Ergocalciferol , (DRISDOL ) 1.25 MG (50000 UNIT) CAPS capsule Take 1 capsule (50,000 Units total) by mouth every 7 (seven) days. 5 capsule 0   No facility-administered medications prior to visit.    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    PAST SURGICAL HISTORY: Past Surgical History:  Procedure Laterality Date   ESOPHAGOGASTRODUODENOSCOPY N/A 10/18/2012   Procedure: ESOPHAGOGASTRODUODENOSCOPY (EGD);  Surgeon: Claudette Cue, MD;  Location: [facility];  Service: Endoscopy;  Laterality: N/A;   ESOPHAGOGASTRODUODENOSCOPY (EGD) WITH PROPOFOL  N/A 10/18/2012   Procedure: ESOPHAGOGASTRODUODENOSCOPY (EGD) WITH PROPOFOL ;  Surgeon: Claudette Cue, MD;  Location: [facility];  Service: Endoscopy;  Laterality: N/A;   FINGER SURGERY     IR ANGIO INTRA EXTRACRAN SEL COM CAROTID INNOMINATE BILAT MOD SED  08/23/2023   IR ANGIO VERTEBRAL SEL SUBCLAVIAN INNOMINATE UNI L MOD SED  08/23/2023   IR ANGIO VERTEBRAL SEL VERTEBRAL UNI R MOD SED  08/23/2023   OPEN REDUCTION INTERNAL FIXATION (ORIF) PROXIMAL PHALANX Right 10/12/2022   Procedure: Right ring finger middle phalanx and proximal interphalangeal joint closed reduction internal percutaneous pinning;  Surgeon: Ltanya Rummer, MD;  Location: [facility];  Service: Orthopedics;  Laterality: Right;    FAMILY HISTORY: Family History  Problem Relation Age of Onset   Healthy Mother    Healthy Father     SOCIAL HISTORY: Social History   Socioeconomic History   Marital status: Single    Spouse name: Not on file   Number of children: Not on file   Years of education: Not on file   Highest education level: Not on file  Occupational History   Not on file  Tobacco Use   Smoking status: Every Day    Current packs/day: 0.50    Types: Cigarettes   Smokeless tobacco: Never  Vaping Use   Vaping status: Some Days  Substance and Sexual Activity   Alcohol use: Yes    Comment: occ   Drug use: No   Sexual activity: Not on file  Other Topics Concern   Not on file  Social History Narrative   Not on file   Social Drivers of Health   Financial Resource Strain: Low Risk  (11/09/2023)   Overall  Financial Resource Strain (CARDIA)    Difficulty of Paying Living Expenses: Not very hard  Food Insecurity: Food Insecurity Present (11/09/2023)   Hunger Vital Sign    Worried About Running Out of Food in the Last Year: Sometimes true    Ran Out of Food in the Last Year: Sometimes true  Transportation Needs: Unmet Transportation Needs (11/09/2023)   PRAPARE - Transportation    Lack of Transportation (Medical): Yes    Lack of Transportation (Non-Medical): Yes  Physical Activity: Sufficiently Active (11/09/2023)   Exercise Vital Sign    Days of Exercise per Week: 5 days    Minutes of Exercise per Session: 30 min  Stress: No Stress Concern Present (11/09/2023)   [employer] of Occupational Health - Occupational Stress Questionnaire    Feeling of Stress :  Only a little  Social Connections: Socially Isolated (11/09/2023)   Social Connection and Isolation Panel [NHANES]    Frequency of Communication with Friends and Family: More than three times a week    Frequency of Social Gatherings with Friends and Family: More than three times a week    Attends Religious Services: Never    Database administrator or Organizations: No    Attends Banker Meetings: Never    Marital Status: Never married  Intimate Partner Violence: Not At Risk (11/09/2023)   Humiliation, Afraid, Rape, and Kick questionnaire    Fear of Current or Ex-Partner: No    Emotionally Abused: No    Physically Abused: No    Sexually Abused: No     PHYSICAL EXAM  GENERAL EXAM/CONSTITUTIONAL: Vitals:  Vitals:   11/16/23 1011  BP: 125/86  Pulse: 96  Weight: 194 lb (88 kg)  Height: 6\' 3"  (1.905 m)   Body mass index is 24.25 kg/m. Wt Readings from Last 3 Encounters:  11/16/23 194 lb (88 kg)  11/09/23 196 lb 6.4 oz (89.1 kg)  10/05/23 188 lb (85.3 kg)   Patient is in no distress; well developed, nourished and groomed; neck is supple  CARDIOVASCULAR: Examination of carotid arteries is normal; no carotid  bruits Regular rate and rhythm, no murmurs Examination of peripheral vascular system by observation and palpation is normal  EYES: Ophthalmoscopic exam of optic discs and posterior segments is normal; no papilledema or hemorrhages No results found.  MUSCULOSKELETAL: Gait, strength, tone, movements noted in Neurologic exam below  NEUROLOGIC: MENTAL STATUS:      No data to display         awake, alert, oriented to person, place and time recent and remote memory intact normal attention and concentration language fluent, comprehension intact, naming intact fund of knowledge appropriate  CRANIAL NERVE:  2nd - no papilledema on fundoscopic exam 2nd, 3rd, 4th, 6th - pupils equal and reactive to light, visual fields full to confrontation, extraocular muscles intact, no nystagmus 5th - facial sensation symmetric 7th - facial strength symmetric 8th - hearing intact 9th - palate elevates symmetrically, uvula midline 11th - shoulder shrug symmetric 12th - tongue protrusion midline  MOTOR:  normal bulk and tone, full strength in the BUE, BLE  SENSORY:  normal and symmetric to light touch, pinprick, temperature, vibration  COORDINATION:  finger-nose-finger, fine finger movements normal  REFLEXES:  deep tendon reflexes present and symmetric  GAIT/STATION:  narrow based gait    DIAGNOSTIC DATA (LABS, IMAGING, TESTING) - I reviewed patient records, labs, notes, testing and imaging myself where available.  Lab Results  Component Value Date   WBC 6.9 09/19/2023   HGB 15.3 09/19/2023   HCT 44.7 09/19/2023   MCV 84.2 09/19/2023   PLT 297 09/19/2023      Component Value Date/Time   NA 139 09/21/2023 0931   K 4.1 09/21/2023 0931   CL 102 09/21/2023 0931   CO2 27 09/21/2023 0931   GLUCOSE 79 09/21/2023 0931   BUN 11 09/21/2023 0931   CREATININE 0.98 09/21/2023 0931   CALCIUM 9.8 09/21/2023 0931   PROT 7.1 09/21/2023 0931   ALBUMIN 4.0 09/21/2023 0931   AST 24  09/21/2023 0931   ALT 29 09/21/2023 0931   ALKPHOS 57 09/21/2023 0931   BILITOT 0.6 09/21/2023 0931   GFRNONAA >60 09/21/2023 0931   GFRAA >90 10/18/2012 1120   Lab Results  Component Value Date   CHOL 172 08/22/2023  HDL 35 (L) 08/22/2023   LDLCALC 119 (H) 08/22/2023   TRIG 88 08/22/2023   CHOLHDL 4.9 08/22/2023   Lab Results  Component Value Date   HGBA1C 5.3 08/22/2023   No results found for: "VITAMINB12" No results found for: "TSH"   08/21/2023 CT head [I reviewed images myself and agree with interpretation. -VRP]  - 3.9 x 2.4 x 4.1 cm parenchymal hemorrhage centered at the right frontoparietal junction. No evidence of intraventricular extension. No mass effect.  08/21/23 CTA head  1. No intracranial large vessel occlusion or significant stenosis. 2. No hemodynamically significant stenosis in the neck. 3. Poor visualization of the vein of [place] on the right. see separately dictated CT venogram for additional findings  08/21/2023 CT venogram 1. No evidence of dural venous sinus thrombosis. 2. Increased vascularity along the upper margin of the hemorrhage in the right frontoparietal region, which may be reactive in the increased venous drainage. An additional differential consideration is a subtle dural AV fistula. Recommend further evaluation with a brain MRI with and without contrast.  08/22/2023 MRI brain with and without [I reviewed images myself and agree with interpretation. -VRP]  - Unchanged appearance of intraparenchymal hematoma within the superior right frontal lobe. No mass lesion.  08/23/23 cerebral angiogram - Angiographically no gross evidence of an arteriovenous malformation, aneurysm, dissection, or dural AV fistula noted. - Mass effect on the right mid parasagittal frontal cortical vein probably hematoma probably secondary to the underlying hematoma.     ASSESSMENT AND PLAN  33 y.o. year old male here with:   Dx:  1. Nontraumatic  subcortical hemorrhage of right cerebral hemisphere [facility])       PLAN:  SPONTANEOUS RIGHT FRONTAL INTRACEREBRAL HEMORRHAGE (Feb 2025; prodromal upper respiratory infection; possible vasoconstriction / hemorrrhage event in setting of cold medication and mild opiate use; otherwise unknown cause) - repeat MRI brain w/wo (to rule out underlying vascular or mass) - check vasculitis labs - check vitamin deficiency labs (per patient request) - continue PT/OT  Orders Placed This Encounter  Procedures   MR BRAIN W WO CONTRAST   ANA,IFA RA Diag Pnl w/rflx Tit/Patn   ANCA Profile   Vitamin D , 25-hydroxy   Vitamin B12   Return for pending if symptoms worsen or fail to improve, pending test results.  I reviewed images, labs, notes, records myself. I summarized findings and reviewed with patient, for this high risk condition (ICH) requiring high complexity decision making.    Omega Bible, MD 11/16/2023, 10:32 AM Certified in Neurology, Neurophysiology and Neuroimaging  [facility] [address], [address] [place], [place] [address] [phone]

## 2023-11-18 ENCOUNTER — Ambulatory Visit: Admitting: Physical Therapy

## 2023-11-20 ENCOUNTER — Ambulatory Visit (HOSPITAL_COMMUNITY)

## 2023-11-20 ENCOUNTER — Other Ambulatory Visit: Payer: Self-pay

## 2023-11-20 ENCOUNTER — Emergency Department (HOSPITAL_COMMUNITY)
Admission: EM | Admit: 2023-11-20 | Discharge: 2023-11-20 | Discharge status: Against Medical Advice | Attending: Emergency Medicine | Admitting: Emergency Medicine

## 2023-11-20 ENCOUNTER — Emergency Department (HOSPITAL_COMMUNITY)

## 2023-11-20 ENCOUNTER — Encounter (HOSPITAL_COMMUNITY): Payer: Self-pay

## 2023-11-20 DIAGNOSIS — R251 Tremor, unspecified: Secondary | ICD-10-CM | POA: Diagnosis present

## 2023-11-20 DIAGNOSIS — Z5329 Procedure and treatment not carried out because of patient's decision for other reasons: Secondary | ICD-10-CM | POA: Insufficient documentation

## 2023-11-20 DIAGNOSIS — R531 Weakness: Secondary | ICD-10-CM | POA: Diagnosis not present

## 2023-11-20 DIAGNOSIS — R748 Abnormal levels of other serum enzymes: Secondary | ICD-10-CM | POA: Insufficient documentation

## 2023-11-20 DIAGNOSIS — R569 Unspecified convulsions: Secondary | ICD-10-CM | POA: Insufficient documentation

## 2023-11-20 DIAGNOSIS — R000 Tachycardia, unspecified: Secondary | ICD-10-CM | POA: Diagnosis not present

## 2023-11-20 DIAGNOSIS — M62838 Other muscle spasm: Secondary | ICD-10-CM | POA: Diagnosis not present

## 2023-11-20 DIAGNOSIS — Z8679 Personal history of other diseases of the circulatory system: Secondary | ICD-10-CM | POA: Diagnosis not present

## 2023-11-20 LAB — DIFFERENTIAL
Abs Immature Granulocytes: 0.17 10*3/uL — ABNORMAL HIGH (ref 0.00–0.07)
Basophils Absolute: 0.1 10*3/uL (ref 0.0–0.1)
Basophils Relative: 1 %
Eosinophils Absolute: 0.3 10*3/uL (ref 0.0–0.5)
Eosinophils Relative: 4 %
Immature Granulocytes: 2 %
Lymphocytes Relative: 17 %
Lymphs Abs: 1.5 10*3/uL (ref 0.7–4.0)
Monocytes Absolute: 1.1 10*3/uL — ABNORMAL HIGH (ref 0.1–1.0)
Monocytes Relative: 12 %
Neutro Abs: 5.7 10*3/uL (ref 1.7–7.7)
Neutrophils Relative %: 64 %

## 2023-11-20 LAB — CBC
HCT: 41.6 % (ref 39.0–52.0)
Hemoglobin: 14.9 g/dL (ref 13.0–17.0)
MCH: 29.2 pg (ref 26.0–34.0)
MCHC: 35.8 g/dL (ref 30.0–36.0)
MCV: 81.4 fL (ref 80.0–100.0)
Platelets: 378 10*3/uL (ref 150–400)
RBC: 5.11 MIL/uL (ref 4.22–5.81)
RDW: 12 % (ref 11.5–15.5)
WBC: 8.9 10*3/uL (ref 4.0–10.5)
nRBC: 0 % (ref 0.0–0.2)

## 2023-11-20 LAB — RAPID URINE DRUG SCREEN, HOSP PERFORMED
Amphetamines: NOT DETECTED
Barbiturates: NOT DETECTED
Benzodiazepines: NOT DETECTED
Cocaine: NOT DETECTED
Opiates: NOT DETECTED
Tetrahydrocannabinol: POSITIVE — AB

## 2023-11-20 LAB — I-STAT CHEM 8, ED
BUN: 9 mg/dL (ref 6–20)
Calcium, Ion: 1.18 mmol/L (ref 1.15–1.40)
Chloride: 103 mmol/L (ref 98–111)
Creatinine, Ser: 1.1 mg/dL (ref 0.61–1.24)
Glucose, Bld: 124 mg/dL — ABNORMAL HIGH (ref 70–99)
HCT: 42 % (ref 39.0–52.0)
Hemoglobin: 14.3 g/dL (ref 13.0–17.0)
Potassium: 3.5 mmol/L (ref 3.5–5.1)
Sodium: 140 mmol/L (ref 135–145)
TCO2: 26 mmol/L (ref 22–32)

## 2023-11-20 LAB — COMPREHENSIVE METABOLIC PANEL WITH GFR
ALT: 21 U/L (ref 0–44)
AST: 26 U/L (ref 15–41)
Albumin: 3.7 g/dL (ref 3.5–5.0)
Alkaline Phosphatase: 66 U/L (ref 38–126)
Anion gap: 10 (ref 5–15)
BUN: 8 mg/dL (ref 6–20)
CO2: 25 mmol/L (ref 22–32)
Calcium: 9.1 mg/dL (ref 8.9–10.3)
Chloride: 104 mmol/L (ref 98–111)
Creatinine, Ser: 1.22 mg/dL (ref 0.61–1.24)
GFR, Estimated: >60 mL/min (ref >60)
Glucose, Bld: 125 mg/dL — ABNORMAL HIGH (ref 70–99)
Potassium: 3.6 mmol/L (ref 3.5–5.1)
Sodium: 139 mmol/L (ref 135–145)
Total Bilirubin: 0.3 mg/dL (ref 0.0–1.2)
Total Protein: 6.9 g/dL (ref 6.5–8.1)

## 2023-11-20 LAB — TROPONIN I (HIGH SENSITIVITY): Troponin I (High Sensitivity): 12 ng/L (ref <18)

## 2023-11-20 LAB — APTT: aPTT: <22 s — ABNORMAL LOW (ref 24–36)

## 2023-11-20 LAB — ETHANOL: Alcohol, Ethyl (B): <15 mg/dL (ref <15)

## 2023-11-20 LAB — CK: Total CK: 439 U/L — ABNORMAL HIGH (ref 49–397)

## 2023-11-20 LAB — PROTIME-INR
INR: 1 (ref 0.8–1.2)
Prothrombin Time: 13 s (ref 11.4–15.2)

## 2023-11-20 MED ORDER — IOHEXOL 350 MG/ML SOLN
75.0000 mL | Freq: Once | INTRAVENOUS | Status: AC | PRN
  Administered 2023-11-20: 75 mL via INTRAVENOUS

## 2023-11-20 MED ORDER — LORAZEPAM 2 MG/ML IJ SOLN: 1.0000 mg | Freq: Once | INTRAMUSCULAR | Status: DC

## 2023-11-20 MED ORDER — LORAZEPAM 2 MG/ML IJ SOLN
2.0000 mg | Freq: Once | INTRAMUSCULAR | Status: DC | PRN
  Filled 2023-11-20: qty 1

## 2023-11-20 MED ORDER — LACTATED RINGERS IV BOLUS
1000.0000 mL | Freq: Once | INTRAVENOUS | Status: AC
  Administered 2023-11-20: 1000 mL via INTRAVENOUS

## 2023-11-20 NOTE — ED Triage Notes (Signed)
 Pt BIB [facility] from home. EMS called out tonight for stroke like symptoms. Pt has been having anxiety and tremors in his left leg. Pt has bilateral sensation. Hx stroke 3 months ago w/ left sided deficits. Upon arrival to ED when transferring from stretcher to wheelchair tremors were noted in left leg and left foot contracted in. Pt also endorses marijuana use tonight.   EMS 130-140P 98% RA 169/82 BP

## 2023-11-20 NOTE — Consult Note (Signed)
 NEUROLOGY CONSULT NOTE   Date of service: Nov 20, 2023 Patient Name: Erik Santana. MRN:  [id number] DOB:  [birth year] Chief Complaint: "L leg twitching" Requesting Provider: Earma Gloss, MD  History of Present Illness  Erik Santana. is a 33 y.o. 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  He reports that he has been up all day and had some marijuana in the evening.  About 45 minutes prior to arrival, his entire left leg including his left foot knee and hip were completely locked up and continued to spasm and would not let go.  This went on for 30 to 45 minutes.  When he had his bleed back in February 2025, he presented very similarly.  He got very worried at this put him into a panic attack.  He has never had panic attacks in the past.  He came into the ED anxious.  His symptoms have resolved on my evaluation.  The spasming of his left leg was seen by the ED doctor and was unclear if it was rhythmic.  Patient and his mom at the bedside report that after his head bleed back in February, patient went a lot of physical therapy.  He wears a foot brace but has significant improvement in his left leg strength.  Patient takes baclofen  and tizanidine  for spasms in his left leg.  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    Past Surgical History:  Procedure Laterality Date   ESOPHAGOGASTRODUODENOSCOPY N/A 10/18/2012   Procedure: ESOPHAGOGASTRODUODENOSCOPY (EGD);  Surgeon: Claudette Cue, MD;  Location: [facility];  Service: Endoscopy;  Laterality: N/A;   ESOPHAGOGASTRODUODENOSCOPY (EGD) WITH PROPOFOL  N/A 10/18/2012    Procedure: ESOPHAGOGASTRODUODENOSCOPY (EGD) WITH PROPOFOL ;  Surgeon: Claudette Cue, MD;  Location: [facility];  Service: Endoscopy;  Laterality: N/A;   FINGER SURGERY     IR ANGIO INTRA EXTRACRAN SEL COM CAROTID INNOMINATE BILAT MOD SED  08/23/2023   IR ANGIO VERTEBRAL SEL SUBCLAVIAN INNOMINATE UNI L MOD SED  08/23/2023   IR ANGIO VERTEBRAL SEL VERTEBRAL UNI R MOD SED  08/23/2023   OPEN REDUCTION INTERNAL FIXATION (ORIF) PROXIMAL PHALANX Right 10/12/2022   Procedure: Right ring finger middle phalanx and proximal interphalangeal joint closed reduction internal percutaneous pinning;  Surgeon: Ltanya Rummer, MD;  Location: [facility];  Service: Orthopedics;  Laterality: Right;    Family History: Family History  Problem Relation Age of Onset   Healthy Mother    Healthy Father     Social History  reports that he has been smoking cigarettes. He has never used smokeless tobacco. He reports current alcohol use. He reports that he does not use drugs.  Allergies  Allergen Reactions   Latex Hives   Nsaids Other (See Comments)    History of a Mallory-Weiss tear (had surgery to fix this 10+ years ago)    Medications   Current Facility-Administered Medications:    LORazepam  (ATIVAN ) injection 2 mg, 2 mg, Intravenous, Once PRN, Afra Tricarico, MD  Current Outpatient Medications:    acetaminophen  (TYLENOL ) 325 MG tablet, Take 1-2 tablets (325-650 mg total) by mouth every 6 (six)  hours as needed for mild pain (pain score 1-3), headache or moderate pain (pain score 4-6) (325 mg for pain 1-3, 6500 mg for pain 4-6)., Disp: , Rfl:    baclofen  (LIORESAL ) 10 MG tablet, Take 1.5 tablets (15 mg total) by mouth 3 (three) times daily., Disp: 90 tablet, Rfl: 3   busPIRone  (BUSPAR ) 10 MG tablet, Take 1.5 tablets (15 mg total) by mouth 3 (three) times daily., Disp: 135 tablet, Rfl: 3   divalproex  (DEPAKOTE  ER) 250 MG 24 hr tablet, Take 1 tablet (250 mg total) by mouth daily., Disp: 30 tablet,  Rfl: 0   hydrOXYzine  (ATARAX ) 25 MG tablet, Take 1 tablet (25 mg total) by mouth 3 (three) times daily as needed for anxiety., Disp: 30 tablet, Rfl: 3   lidocaine  (LIDODERM ) 5 %, Place 1 patch onto the skin every 12 (twelve) hours as needed. Remove & Discard patch within 12 hours or as directed by MD, Disp: 30 patch, Rfl: 3   loratadine  (CLARITIN ) 10 MG tablet, Take 1 tablet (10 mg total) by mouth daily., Disp: 30 tablet, Rfl: 0   meclizine  (ANTIVERT ) 25 MG tablet, Take 1 tablet (25 mg total) by mouth 3 (three) times daily as needed for dizziness., Disp: 30 tablet, Rfl: 3   scopolamine  (TRANSDERM-SCOP) 1 MG/3DAYS, Place 1 patch (1.5 mg total) onto the skin every 3 (three) days., Disp: 10 patch, Rfl: 3   tiZANidine  (ZANAFLEX ) 4 MG tablet, Take 1 tablet (4 mg total) by mouth every 8 (eight) hours as needed for muscle spasms., Disp: 90 tablet, Rfl: 3   traMADol  (ULTRAM ) 50 MG tablet, Take 1 tablet (50 mg total) by mouth 2 (two) times daily as needed., Disp: 10 tablet, Rfl: 1   traZODone  (DESYREL ) 100 MG tablet, Take 0.5-1 tablets (50-100 mg total) by mouth at bedtime as needed for sleep., Disp: 30 tablet, Rfl: 3  Vitals   Vitals:   11/20/23 0036 11/20/23 0040  BP: (!) 160/92   Pulse: (!) 127   Resp: (!) 24   Temp: 99.6 F (37.6 C)   SpO2: 96%   Weight:  88.9 kg  Height:  6\' 3"  (1.905 m)    Body mass index is 24.5 kg/m.  Physical Exam   General: Laying comfortably in bed; in no acute distress.  HENT: Normal oropharynx and mucosa. Normal external appearance of ears and nose.  Neck: Supple, no pain or tenderness  CV: No JVD. No peripheral edema.  Pulmonary: Symmetric Chest rise. Normal respiratory effort.  Abdomen: Soft to touch, non-tender.  Ext: No cyanosis, edema, or deformity  Skin: No rash. Normal palpation of skin.   Musculoskeletal: Normal digits and nails by inspection. No clubbing.   Neurologic Examination  Mental status/Cognition: Alert, oriented to self, place, month and  year, good attention.  Speech/language: Fluent, comprehension intact, object naming intact, repetition intact.  Cranial nerves:   CN II Pupils equal and reactive to light, no VF deficits    CN III,IV,VI EOM intact, no gaze preference or deviation, no nystagmus    CN V normal sensation in V1, V2, and V3 segments bilaterally    CN VII no asymmetry, no nasolabial fold flattening    CN VIII normal hearing to speech    CN IX & X normal palatal elevation, no uvular deviation    CN XI 5/5 head turn and 5/5 shoulder shrug bilaterally    CN XII midline tongue protrusion    Motor:  Muscle bulk: Normal, tone normal Mvmt Root Nerve  Muscle  Right Left Comments  SA C5/6 Ax Deltoid 5 5   EF C5/6 Mc Biceps 5 5   EE C6/7/8 Rad Triceps 5 5   WF C6/7 Med FCR     WE C7/8 PIN ECU     F Ab C8/T1 U ADM/FDI 5 5   HF L1/2/3 Fem Illopsoas 5 4+   KE L2/3/4 Fem Quad 5 4+   DF L4/5 D Peron Tib Ant 5 2   PF S1/2 Tibial Grc/Sol 5 2    Sensation:  Light touch Intact throughout   Pin prick    Temperature    Vibration   Proprioception    Coordination/Complex Motor:  - Finger to Nose intact bilaterally - Heel to shin ataxia in left lower extremity. - Rapid alternating movement are intact in bilateral upper extremities. - Gait: Deferred for patient safety. Labs/Imaging/Neurodiagnostic studies   CBC:  Recent Labs  Lab 2023/11/27 0133 11/27/23 0147  WBC 8.9  --   NEUTROABS 5.7  --   HGB 14.9 14.3  HCT 41.6 42.0  MCV 81.4  --   PLT 378  --    Basic Metabolic Panel:  Lab Results  Component Value Date   NA 140 11/27/2023   K 3.5 11-27-23   CO2 25 27-Nov-2023   GLUCOSE 124 (H) Nov 27, 2023   BUN 9 November 27, 2023   CREATININE 1.10 11/27/2023   CALCIUM 9.1 27-Nov-2023   GFRNONAA >60 2023-11-27   GFRAA >90 10/18/2012   Lipid Panel:  Lab Results  Component Value Date   LDLCALC 119 (H) 08/22/2023   HgbA1c:  Lab Results  Component Value Date   HGBA1C 5.3 08/22/2023   Urine Drug Screen:      Component Value Date/Time   LABOPIA NONE DETECTED 08/21/2023 1910   COCAINSCRNUR NONE DETECTED 08/21/2023 1910   LABBENZ NONE DETECTED 08/21/2023 1910   AMPHETMU NONE DETECTED 08/21/2023 1910   THCU POSITIVE (A) 08/21/2023 1910   LABBARB NONE DETECTED 08/21/2023 1910    Alcohol Level     Component Value Date/Time   ETH <15 11-27-23 0133   INR  Lab Results  Component Value Date   INR 1.0 2023/11/27   APTT  Lab Results  Component Value Date   APTT 27 08/21/2023   AED levels: No results found for: "PHENYTOIN", "ZONISAMIDE", "LAMOTRIGINE", "LEVETIRACETA"   CT head without contrast and angio Head and Neck with contrast: Pending  MRI Brain w + w/o C: Pending  Neurodiagnostics rEEG:  Pending  ASSESSMENT   Hudson Majkowski. is a 33 y.o. 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  He is sleep deprived and has been up all day and used some marijuana in the evening.  clinically hard to say if this truly was a focal seizure.  He is certainly at risk with his recent hemorrhage.  He also feels that his presentation today is similar to his prior presentation back in February when he had the hemorrhage.  RECOMMENDATIONS  -CT head without contrast along with CT angio of the head and neck with contrast. -MRI of the brain with and without contrast. -Routine EEG. -Neurology will follow-up on the results of the testing above.  However, we do not plan to see him again inpatient unless specifically requested by patient or by primary team. -If the results of the testing above is nonrevealing, patient can be discharged with outpatient follow-up  with Dr. Salli Crawley with [facility] neurology. ______________________________________________________________________  Plan was discussed with patient and his mother at the bedside.  Plan was also discussed with  Dr. Alison Irvine with the ED team.  Signed, Leon Goodnow, MD [facility]

## 2023-11-20 NOTE — Progress Notes (Signed)
 Patient unavailable for EEG due to being in a hallway bed. Notified nurse that patient needs a room for eeg to be completed.

## 2023-11-20 NOTE — ED Provider Notes (Signed)
 7:49 AM Patient admitted to the hospital.  Please see full HPI and documentation from Dr. Alison Irvine who is leaving at this time as he is over his shift.  Dr. Lilyan Remedies went to admit the patient however patient refused admission. He had had discussion with Dr. Matthew Songster earlier about risks and benefits.  I went to reiterate risks and benefits of leaving against medical advice.  I have discussed my concerns as a provider and the possibility that this may worsen. We discussed the nature, risks and benefits, and alternatives to treatment. I have specifically discussed that without further evaluation I cannot guarantee there is not a life or organ threatening event occuring.  Time was given to allow the opportunity to ask questions and consider the options, and after the discussion, the patient decided to refuse the offered treatment. Pt is A&Ox4, he is his own POA  and is competent to make decisions HE states understanding of my concerns and the possible consequences. After refusal, I made every reasonable opportunity to treat them to the best of my ability. I have made the patient aware that this is an AMA discharge, but he may return at any time for further evaluation and treatment. OP follow up with neurology and seizure precautions given      Tama Fails, PA-C 11/20/23 0758    Earma Gloss, MD 11/20/23 [phone]

## 2023-11-20 NOTE — ED Provider Notes (Signed)
 [facility] AT [facility] Provider Note   CSN: [id number] Arrival date & time: 11/20/23  0032     History  Chief Complaint  Patient presents with   Tremors    Erik Santana. is a 33 y.o. male.  33 year old male with intracerebral hemorrhage of uncertain etiology in February with residual left-sided weakness here with tremors to his left leg and spasms tonight night.  Symptoms started while he was resting about 30 minutes prior to arrival.  Does admit to some marijuana use.  States he has ongoing left-sided weakness for which she is going to rehab.  Developed tremors and spasms to his left leg tonight and has difficulty controlling the tremors to his leg.  Does feel like he has new weakness to the leg.  No new weakness to his arms.  No facial droop.  Difficulty speaking or difficulty swallowing.  No fever, chest pain or shortness of breath.  Tachycardic on arrival and anxious.  No known seizure activity.  Does not to marijuana use tonight.        Home Medications Prior to Admission medications   Medication Sig Start Date End Date Taking? Authorizing Provider  acetaminophen  (TYLENOL ) 325 MG tablet Take 1-2 tablets (325-650 mg total) by mouth every 6 (six) hours as needed for mild pain (pain score 1-3), headache or moderate pain (pain score 4-6) (325 mg for pain 1-3, 6500 mg for pain 4-6). 09/20/23   Angiulli, Everlyn Hockey, PA-C  baclofen  (LIORESAL ) 10 MG tablet Take 1.5 tablets (15 mg total) by mouth 3 (three) times daily. 10/05/23   Bea Lime, DO  busPIRone  (BUSPAR ) 10 MG tablet Take 1.5 tablets (15 mg total) by mouth 3 (three) times daily. 10/05/23   Bea Lime, DO  divalproex  (DEPAKOTE  ER) 250 MG 24 hr tablet Take 1 tablet (250 mg total) by mouth daily. 10/21/23   Jodi Munroe, NP  hydrOXYzine  (ATARAX ) 25 MG tablet Take 1 tablet (25 mg total) by mouth 3 (three) times daily as needed for anxiety. 10/05/23   Bea Lime, DO  lidocaine   (LIDODERM ) 5 % Place 1 patch onto the skin every 12 (twelve) hours as needed. Remove & Discard patch within 12 hours or as directed by MD 10/05/23   Bea Lime, DO  loratadine  (CLARITIN ) 10 MG tablet Take 1 tablet (10 mg total) by mouth daily. 09/21/23   Angiulli, Everlyn Hockey, PA-C  meclizine  (ANTIVERT ) 25 MG tablet Take 1 tablet (25 mg total) by mouth 3 (three) times daily as needed for dizziness. 10/05/23   Bea Lime, DO  scopolamine  (TRANSDERM-SCOP) 1 MG/3DAYS Place 1 patch (1.5 mg total) onto the skin every 3 (three) days. 10/05/23   Bea Lime, DO  tiZANidine  (ZANAFLEX ) 4 MG tablet Take 1 tablet (4 mg total) by mouth every 8 (eight) hours as needed for muscle spasms. 10/05/23   Bea Lime, DO  traMADol  (ULTRAM ) 50 MG tablet Take 1 tablet (50 mg total) by mouth 2 (two) times daily as needed. 10/21/23   Jodi Munroe, NP  traZODone  (DESYREL ) 100 MG tablet Take 0.5-1 tablets (50-100 mg total) by mouth at bedtime as needed for sleep. 10/05/23   Bea Lime, DO      Allergies    Latex and Nsaids    Review of Systems   Review of Systems  Constitutional:  Negative for activity change, appetite change and fever.  HENT:  Negative for congestion and rhinorrhea.  Eyes:  Negative for visual disturbance.  Respiratory:  Negative for cough, chest tightness and shortness of breath.   Cardiovascular:  Negative for chest pain.  Gastrointestinal:  Negative for abdominal pain, nausea and vomiting.  Musculoskeletal:  Positive for arthralgias and myalgias.  Neurological:  Positive for tremors, weakness and numbness. Negative for dizziness and headaches.    all other systems are negative except as noted in the HPI and PMH.   Physical Exam Updated Vital Signs BP (!) 160/92 (BP Location: Right Arm)   Pulse (!) 127   Temp 99.6 F (37.6 C)   Resp (!) 24   Ht 6\' 3"  (1.905 m)   Wt 88.9 kg   SpO2 96%   BMI 24.50 kg/m  Physical Exam Vitals and nursing note reviewed.  Constitutional:       General: He is not in acute distress.    Appearance: He is well-developed.     Comments: anxious  HENT:     Head: Normocephalic and atraumatic.     Mouth/Throat:     Pharynx: No oropharyngeal exudate.  Eyes:     Conjunctiva/sclera: Conjunctivae normal.     Pupils: Pupils are equal, round, and reactive to light.  Neck:     Comments: No meningismus. Cardiovascular:     Rate and Rhythm: Regular rhythm. Tachycardia present.     Heart sounds: Normal heart sounds. No murmur heard. Pulmonary:     Effort: Pulmonary effort is normal. No respiratory distress.     Breath sounds: Normal breath sounds.  Abdominal:     Palpations: Abdomen is soft.     Tenderness: There is no abdominal tenderness. There is no guarding or rebound.  Musculoskeletal:        General: No tenderness. Normal range of motion.     Cervical back: Normal range of motion and neck supple.     Comments: Intact DP and PT pulses bilateral  Skin:    General: Skin is warm.  Neurological:     Mental Status: He is alert and oriented to person, place, and time.     Cranial Nerves: No cranial nerve deficit.     Motor: No abnormal muscle tone.     Coordination: Coordination normal.     Comments: No facial droop.  Tongue is midline.  Able to hold arms off the bed bilaterally against gravity.  Slight decreased grip strength on the left which is chronic per his report.  There is active tremoring of left lower leg which patient states he cannot control. Patient with tremoring of his left lower extremity.  Unable to test strength due to ongoing tremors and his ability to comply.  After tremors have resolved, patient has good strength of flexion extension of left knee but reduced compared to right side.  He has left foot drop which is unchanged.  Psychiatric:        Behavior: Behavior normal.     ED Results / Procedures / Treatments   Labs (all labs ordered are listed, but only abnormal results are displayed) Labs Reviewed   APTT - Abnormal; Notable for the following components:      Result Value   aPTT <22 (*)    All other components within normal limits  DIFFERENTIAL - Abnormal; Notable for the following components:   Monocytes Absolute 1.1 (*)    Abs Immature Granulocytes 0.17 (*)    All other components within normal limits  COMPREHENSIVE METABOLIC PANEL WITH GFR - Abnormal; Notable for the following components:  Glucose, Bld 125 (*)    All other components within normal limits  RAPID URINE DRUG SCREEN, HOSP PERFORMED - Abnormal; Notable for the following components:   Tetrahydrocannabinol POSITIVE (*)    All other components within normal limits  CK - Abnormal; Notable for the following components:   Total CK 439 (*)    All other components within normal limits  I-STAT CHEM 8, ED - Abnormal; Notable for the following components:   Glucose, Bld 124 (*)    All other components within normal limits  ETHANOL  PROTIME-INR  CBC  TROPONIN I (HIGH SENSITIVITY)  TROPONIN I (HIGH SENSITIVITY)    EKG EKG Interpretation Date/Time:  Sunday Nov 20 2023 01:59:40 EDT Ventricular Rate:  115 PR Interval:  168 QRS Duration:  98 QT Interval:  332 QTC Calculation: 459 R Axis:   69  Text Interpretation: Sinus tachycardia Otherwise normal ECG When compared with ECG of 20-Nov-2023 00:41, PREVIOUS ECG IS PRESENT No significant change was found Confirmed by Earma Gloss [phone]) on 11/20/2023 2:02:33 AM  Radiology CT ANGIO HEAD NECK W WO CM Result Date: 11/20/2023 CLINICAL DATA:  Neuro deficit, acute, stroke suspected. EXAM: CT ANGIOGRAPHY HEAD AND NECK WITH AND WITHOUT CONTRAST TECHNIQUE: Multidetector CT imaging of the head and neck was performed using the standard protocol during bolus administration of intravenous contrast. Multiplanar CT image reconstructions and MIPs were obtained to evaluate the vascular anatomy. Carotid stenosis measurements (when applicable) are obtained utilizing NASCET criteria, using  the distal internal carotid diameter as the denominator. RADIATION DOSE REDUCTION: This exam was performed according to the departmental dose-optimization program which includes automated exposure control, adjustment of the mA and/or kV according to patient size and/or use of iterative reconstruction technique. CONTRAST:  75mL OMNIPAQUE  IOHEXOL  350 MG/ML SOLN COMPARISON:  CTA head/neck August 21, 2023. MRI August 22, 2023. FINDINGS: CT HEAD FINDINGS Brain: Developing encephalomalacia in the high right frontal lobe at the site of prior hemorrhage. No evidence of acute large vascular territory infarct, acute hemorrhage, mass lesion, midline shift or hydrocephalus. Vascular: See below. Skull: No acute fracture. Sinuses/Orbits: Mostly clear sinuses.  No acute orbital findings. Other: No mastoid effusions. Review of the MIP images confirms the above findings CTA NECK FINDINGS Aortic arch: Partially imaged great vessel origins. The visualized great vessel origins are patent. Right carotid system: No evidence of dissection, stenosis (50% or greater), or occlusion. Left carotid system: No evidence of dissection, stenosis (50% or greater), or occlusion. Vertebral arteries: Codominant. No evidence of dissection, stenosis (50% or greater), or occlusion. Skeleton: Negative. Other neck: Negative. Upper chest: Visualized lung apices are clear. Review of the MIP images confirms the above findings CTA HEAD FINDINGS Anterior circulation: Bilateral intracranial ICAs, MCAs, and ACAs are patent without proximal hemodynamically significant stenosis. No aneurysm or arteriovenous malformation identified. Posterior circulation: Bilateral intradural vertebral arteries, basilar artery and bilateral posterior cerebral arteries are patent without proximal hemodynamically significant stenosis. No aneurysm or arteriovenous malformation identified. Venous sinuses: As permitted by contrast timing, patent. Review of the MIP images confirms the  above findings IMPRESSION: 1. No large vessel occlusion or proximal hemodynamically significant stenosis. 2. No aneurysm or arteriovenous malformation identified. 3. Developing encephalomalacia in the high right frontal lobe at the site of prior hemorrhage. Electronically Signed   By: Stevenson Elbe M.D.   On: 11/20/2023 03:06    Procedures Procedures    Medications Ordered in ED Medications  LORazepam  (ATIVAN ) injection 1 mg (has no administration in time range)  ED Course/ Medical Decision Making/ A&P                                 Medical Decision Making Amount and/or Complexity of Data Reviewed Labs: ordered. Decision-making details documented in ED Course. Radiology: ordered and independent interpretation performed. Decision-making details documented in ED Course. ECG/medicine tests: ordered and independent interpretation performed. Decision-making details documented in ED Course.  Risk Prescription drug management. Decision regarding hospitalization.   Patient from home with tremors to his left lower extremity onset this evening.  He is tachycardic and admits to marijuana use.  Does have left-sided weakness from previous stroke. Leg spasms have resolved after approximately 30 to 45 minutes.  Discussed with Dr. Murvin Arthurs of neurology on arrival.  Agrees with not calling code stroke given his previous brain hemorrhage and LVO negative.  Does agree with CT and CTA at this time.  Question possible partial seizure activity.  Will give some Ativan .  Tremors have improved without medication.  Patient feels his leg has relaxed.  Concern for partial seizures.  CT and CTA recommended by neurology which are in process.  Recommended admission for EEG and MRI in the morning to rule out seizure disorder.  Dr. Murvin Arthurs does not recommend antiepileptics at this time He does agree that he is high risk for seizures and recommends EEG and MRI but no antiepileptics at this  time.  Tremoring has resolved.  CK mildly elevated at 400.  Will hydrate.  Tachycardia has resolved.  CT head shows stable changes without aneurysm or hemorrhage. UDS positive for THC, otherwise reassuring.  IV fluids initiated.  MRI and EEG pending.  Admission discussed with Dr. Michell Ahumada.        Final Clinical Impression(s) / ED Diagnoses Final diagnoses:  Seizure-like activity (HCC)  Tremors of nervous system    Rx / DC Orders ED Discharge Orders     None         Everett Ehrler, Mara [place], MD 11/20/23 [phone]

## 2023-11-20 NOTE — Discharge Instructions (Addendum)
 Follow up with neurology Return at any time if you reconsider admission and further work up.  Per [place]  DMV statutes, patients with seizures are not allowed to drive until  they have been seizure-free for six months. Use caution when using heavy equipment or power tools. Avoid working on ladders or at heights. Take showers instead of baths. Ensure the water temperature is not too high on the home water heater. Do not go swimming alone. When caring for infants or small children, sit down when holding, feeding, or changing them to minimize risk of injury to the child in the event you have a seizure.   Also, Maintain good sleep hygiene. Avoid alcohol.

## 2023-11-20 NOTE — ED Notes (Signed)
 Patient transported to CT scan .

## 2023-11-20 NOTE — ED Notes (Signed)
 Pt to CT

## 2023-11-25 ENCOUNTER — Ambulatory Visit: Admitting: Occupational Therapy

## 2023-11-25 ENCOUNTER — Ambulatory Visit

## 2023-11-25 ENCOUNTER — Telehealth: Payer: Self-pay | Admitting: *Deleted

## 2023-11-25 ENCOUNTER — Encounter: Payer: Self-pay | Admitting: Occupational Therapy

## 2023-11-25 DIAGNOSIS — R278 Other lack of coordination: Secondary | ICD-10-CM

## 2023-11-25 DIAGNOSIS — M6281 Muscle weakness (generalized): Secondary | ICD-10-CM

## 2023-11-25 DIAGNOSIS — R269 Unspecified abnormalities of gait and mobility: Secondary | ICD-10-CM

## 2023-11-25 DIAGNOSIS — R2681 Unsteadiness on feet: Secondary | ICD-10-CM

## 2023-11-25 DIAGNOSIS — R2689 Other abnormalities of gait and mobility: Secondary | ICD-10-CM

## 2023-11-25 NOTE — Progress Notes (Signed)
 Complex Care Management Note  Care Guide Note 11/25/2023 Name: Devean Skoczylas. MRN: [id number] DOB: 1990-09-14  Aydn Ferrara. is a 33 y.o. year old male who sees Senaida Dama, NP for primary care. I reached out to Lauran Pollard. by phone today to offer complex care management services.  Mr. Cheese was given information about Complex Care Management services today including:   The Complex Care Management services include support from the care team which includes your Nurse Care Manager, Clinical Social Worker, or Pharmacist.  The Complex Care Management team is here to help remove barriers to the health concerns and goals most important to you. Complex Care Management services are voluntary, and the patient may decline or stop services at any time by request to their care team member.   Complex Care Management Consent Status: Patient agreed to services and verbal consent obtained.   Follow up plan:  Telephone appointment with complex care management team member scheduled for:  11/29/23  Encounter Outcome:  Patient Scheduled  Barnie Bora  [facility]  [facility], [facility] Guide  Direct Dial: [phone]  Fax [phone]

## 2023-11-25 NOTE — Therapy (Signed)
 [facility] [address] [place], [place], [address] Phone: [phone]   Fax:  [phone]  Patient Details  Name: Erik Santana. MRN: [id number] Date of Birth: [birth year]  Arrived, No Charge OCCUPATIONAL THERAPY DISCHARGE SUMMARY   Visits from Start of Care: 2  11/25/23 Update: After PT session today, OT spoke to pt and family. Pt requested to D/C from OT today in order to save visits and focus on PT, including walking and LLE function. Pt reported good ability to use affected LUE for functional tasks and demo'd WFL LUE ROM of shoulder, elbow, and hand. Pt reported minimal difficulty with daily tasks. Pt reported sometimes LUE feels a little "sluggish, jumpy" though overall pt reports he is able to compensate well and has returned to participating in daily tasks. OT educated pt on OT role and that a new referral would be required to return to OT. Pt acknowledged understanding.   Current functional level related to goals / functional outcomes: Goals not assessed. Pt requested to D/C from OT.   Remaining deficits: Pt continuing to work with PT to address LLE. See PT notes for additional details.   Pt reported good ability to use affected LUE for functional tasks and demo'd WFL LUE ROM of shoulder, elbow, and hand. Pt reported minimal difficulty with daily tasks. Pt reported sometimes LUE feels a little "sluggish, jumpy" though overall pt reports he is able to compensate well and has returned to participating in daily tasks.  Education / Equipment: Pt has needed materials and education. See tx notes for more details. Pt reports good understanding of LUE HEP and LUE function.    Patient goals were not assessed. Patient is being discharged due to the patient's request. Pt agreeable to D/C. Pt confirmed no additional questions/concerns related to OT at this time.   If additional OT services are recommended, a new OT referral will be required.      Oakley Bellman, OT 11/25/2023, 3:41 PM  [facility] [address] [place], [place], [address] Phone: [phone]   Fax:  [phone]

## 2023-11-25 NOTE — Therapy (Signed)
 OUTPATIENT PHYSICAL THERAPY NEURO TREATMENT/ 10th VISIT PROGRESS NOTE   Patient Name: Erik Santana. MRN: [id number] DOB:01/14/1991, 33 y.o., male 47 Date: 11/25/2023   PCP: Lavona Pounds, NP REFERRING PROVIDER: Sterling Eisenmenger, PA-C  Physical Therapy Progress Note   Dates of Reporting Period:10/21/23-11/25/23  See Note below for Objective Data and Assessment of Progress/Goals.  Thank you for the referral of this patient. Rebecca Campus, PT, DPT, CBIS   END OF SESSION:  PT End of Session - 11/25/23 1448     Visit Number 10    Number of Visits 11    Date for PT Re-Evaluation 12/16/23    Authorization Type [place] Medicaid    PT Start Time 1447    PT Stop Time 1530    PT Time Calculation (min) 43 min    Equipment Utilized During Treatment Gait belt    Activity Tolerance Patient tolerated treatment well    Behavior During Therapy WFL for tasks assessed/performed;Anxious               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   Past Surgical History:  Procedure Laterality Date   ESOPHAGOGASTRODUODENOSCOPY N/A 10/18/2012   Procedure: ESOPHAGOGASTRODUODENOSCOPY (EGD);  Surgeon: Claudette Cue, MD;  Location: [facility];  Service: Endoscopy;  Laterality: N/A;   ESOPHAGOGASTRODUODENOSCOPY (EGD) WITH PROPOFOL  N/A 10/18/2012   Procedure: ESOPHAGOGASTRODUODENOSCOPY (EGD) WITH PROPOFOL ;  Surgeon: Claudette Cue, MD;  Location: [facility];  Service: Endoscopy;  Laterality: N/A;   FINGER SURGERY     IR ANGIO INTRA EXTRACRAN SEL COM CAROTID INNOMINATE BILAT MOD SED  08/23/2023   IR ANGIO VERTEBRAL SEL SUBCLAVIAN INNOMINATE UNI L MOD SED  08/23/2023   IR ANGIO VERTEBRAL SEL VERTEBRAL UNI R MOD SED  08/23/2023   OPEN REDUCTION INTERNAL FIXATION (ORIF) PROXIMAL PHALANX Right 10/12/2022   Procedure: Right ring finger middle phalanx and  proximal interphalangeal joint closed reduction internal percutaneous pinning;  Surgeon: Ltanya Rummer, MD;  Location:  SURGERY CENTER;  Service: Orthopedics;  Laterality: Right;   Patient Active Problem List   Diagnosis Date Noted   Emotional lability 10/05/2023   Left foot drop 10/05/2023   Cognitive change 08/30/2023   Intraparenchymal hematoma of brain (HCC) 08/24/2023   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   Anxiety state 10/27/2012   Hematemesis 10/18/2012   Other esophagitis 10/18/2012   Mallory-Weiss tear 10/18/2012    ONSET DATE: 09/22/2023 (referral date)  REFERRING DIAG: S06.33AA (ICD-10-CM) - Intraparenchymal hematoma of brain due to trauma [facility])  THERAPY DIAG:  Other abnormalities of gait and mobility  Other lack of coordination  Muscle weakness (generalized)  Abnormality of gait and mobility  Unsteadiness on feet  Rationale for Evaluation and Treatment: Rehabilitation  SUBJECTIVE:  SUBJECTIVE STATEMENT: Patient arrives to clinic with no AFO using R crutch.   Pt accompanied by: self and mother, Erik Santana  PERTINENT HISTORY: parenchymal hemorrhage centered at the right frontal parietal junction, intraparenchymal hematoma within the superior right frontal lobe, seizures   PAIN:  Are you having pain? 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 Yes. 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  DIAGNOSTIC FINDINGS:   MR 08/22/2023: IMPRESSION: Unchanged appearance of intraparenchymal hematoma within the superior right frontal lobe. No mass lesion.  COGNITION: Overall cognitive status: Within functional limits for tasks assessed   VITALS:  There were no vitals filed for this visit.                                                                                                TREATMENT:   Theract: -yoga, management of anxiety, review of weekend events - : 267' SBA, no AD + AFO  NMR: -elliptical x6 mins  -seated L ankle eversion attempts to target    PATIENT EDUCATION: Education details: L ankle eversion to target, f/u with PCP re: anxiety rx management, see above Person educated: Patient and Parent Education method: Explanation and Demonstration Education comprehension: verbalized understanding, returned demonstration, and needs further education  HOME EXERCISE PROGRAM: Access Code: [email or web address] URL: [email or web address] Date: 11/01/2023 Prepared by: Camella Cave  Exercises - Staggered Sit-to-Stand  - 1 x daily - 7 x weekly - 3 sets - 10 reps - Backward Walking with Counter Support  - 1 x daily - 7 x weekly - 5 sets  Access Code: [email or web address] URL: [email or web address] Date: 11/04/2023 Prepared by: Burleigh Carp Plaster  Exercises - Calf stretch  - 1 x daily - 7 x weekly - 60-90 seconds hold - Squat with Resistance at Thighs  - 1 x daily - 7 x weekly - 3 sets - 10 reps - Side Stepping with Resistance at Thighs  - 1 x daily - 7 x weekly - 3 sets - 10  reps  GOALS: Goals reviewed with patient? Yes  SHORT TERM GOALS: Target date: 11/18/23  Patient will report demonstrate independence with initial HEP in order to maintain current gains and continue to progress after physical therapy discharge.   Baseline: To be provided ; provided Goal status: MET  2.  to be assessed / LTG written  Baseline: To be assessed; 267' SBA, no AD + AFO Goal status: MET   LONG TERM GOALS: Target date: 12/16/2023  Patient will report demonstrate independence with final HEP in order to maintain current gains and continue to progress after physical therapy discharge.   Baseline: To be provided  Goal status: INITIAL  2.  Patient will improve gait speed to 0.5 m/s or greater to indicate an improvement from being a household ambulator to limited community ambulator.   Baseline: 0.34 m/s with R lofstrand crutch and L AFO (SBA) Goal status: INITIAL  3.  Patient will improve their 5x Sit to Stand score to less than 15 seconds to demonstrate a decreased risk for falls and improved LE strength.   Baseline: 18.97 seconds without UE use Goal status: INITIAL  4.  Patient will ambulate at least 314ft during with LRAD to demonstrate improved community ambulation tolerance Baseline: 267' SBA no AD + AFO Goal status: REVISED  ASSESSMENT:  CLINICAL IMPRESSION: Patient seen for skilled PT session with emphasis on patient education and STG assessment. He met 2/2 STG. 2 Min Walk Test:  Instructed patient to ambulate as quickly and as safely as possible for 2 minutes using LRAD. Patient was allowed to take standing rest breaks without stopping the test, but if the patient required a sitting rest break the clock would be stopped and the test would be over.  Results: 267 feet. Results indicate that the patient has reduced endurance with ambulation compared to age matched and population- specific norms.  Age Matched and population specific Norms: community dwelling  elderly adults: 4101ft (MDC: 54ft); MS: 567.13ft (MDC: 65ft); SCI: 358.24ft, Patient continues to report significant levels of anxiety that PT, patient and mom all observe manifesting as increased episodes of tone and clonus. This poses a significant fall risk and greatly impacts his overall progress in both therapy and return to independent mobility. He would greatly benefit from appropriate management of his anxiety. Discussed non-pharmacological techniques with patient to assist with managing his anxiety. Elliptical used to simulate exaggerated gait pattern. With fatigue, patient resuming with L LE tibial IR (?) and ankle supination. Continue POC.    OBJECTIVE IMPAIRMENTS: Abnormal gait, decreased activity tolerance, decreased balance, decreased endurance, decreased mobility, difficulty walking, decreased ROM, decreased strength, increased fascial restrictions, impaired flexibility, impaired sensation, impaired tone, and pain.   ACTIVITY LIMITATIONS: carrying, standing, transfers, and locomotion level  PARTICIPATION LIMITATIONS: driving, community activity, occupation, and yard work  PERSONAL FACTORS: Age, Time since onset of injury/illness/exacerbation, Transportation, and 1-2 comorbidities: see above are also affecting patient's functional outcome.   REHAB POTENTIAL: Good  CLINICAL DECISION MAKING: Evolving/moderate complexity  EVALUATION COMPLEXITY: Moderate  PLAN:  PT FREQUENCY/DURATION: 2x/week for 4 weeks and then 1x week for 2 weeks   PLANNED INTERVENTIONS: 97164- PT Re-evaluation, 97110-Therapeutic exercises, 97530- Therapeutic activity, [id number]- Neuromuscular re-education, 97535- Self Care, 16109- Manual therapy, [id number]- Gait training, [id number]- Orthotic/Prosthetic subsequent, and [phone]- Aquatic Therapy  PLAN FOR NEXT SESSION: provide him with info on LYB, TM and elliptical. Don rigid AFO.  stretching program with emphasis on hamstring tightness, quad strengthening as able, could consider  trial of Bioness, high intensity gait training, continue conversation about additional botox (recommended vs not pending gait), L NMR re-ed  review functional lift technique as appropriate (dead lift hip hinge)   For all possible CPT codes, reference the Planned Interventions line above.     Check all conditions that are expected to impact treatment: {Conditions expected to impact treatment:Contractures, spasticity  or fracture relevant to requested treatment and Neurological condition and/or seizures   If treatment provided at initial evaluation, no treatment charged due to lack of authorization.      Rebecca Campus, PT Rebecca Campus, PT, DPT, CBIS  11/25/2023, 4:00 PM

## 2023-11-26 LAB — ANCA PROFILE
Anti-MPO Antibodies: <0.2 U (ref 0.0–0.9)
Anti-PR3 Antibodies: <0.2 U (ref 0.0–0.9)
Atypical pANCA: <1:20 {titer}
C-ANCA: <1:20 {titer}
P-ANCA: <1:20 {titer}

## 2023-11-26 LAB — ANA,IFA RA DIAG PNL W/RFLX TIT/PATN
ANA Titer 1: NEGATIVE
Cyclic Citrullin Peptide Ab: 10 U (ref 0–19)
Rheumatoid fact SerPl-aCnc: <10 [IU]/mL (ref <14.0)

## 2023-11-26 LAB — VITAMIN B12: Vitamin B-12: 567 pg/mL (ref 232–1245)

## 2023-11-26 LAB — VITAMIN D 25 HYDROXY (VIT D DEFICIENCY, FRACTURES): Vit D, 25-Hydroxy: 27.1 ng/mL — ABNORMAL LOW (ref 30.0–100.0)

## 2023-11-29 ENCOUNTER — Other Ambulatory Visit: Payer: Self-pay

## 2023-11-29 NOTE — Patient Outreach (Signed)
 SW called [place] [employer] to ask about assistance with food stamps application. Social worker was told patient can come in-person to receive assistance with his application. Time and location of office was given to Child psychotherapist. Social worker email patient this information and encouraged him and his mother to visit the local DSS office to apply.

## 2023-11-29 NOTE — Patient Instructions (Signed)
 Visit Information  Thank you for taking time to visit with me today. Please don't hesitate to contact me if I can be of assistance to you before our next scheduled appointment.  Our next appointment is by telephone on 12/13/2023 at 11AM Please call the care guide team at [phone] if you need to cancel or reschedule your appointment.   Following is a copy of your care plan:   Goals Addressed             This Visit's Progress    BSW VBCI Social Work Care Plan       Problems:   Air traffic controller Insecurity   CSW Clinical Goal(s):   Over the next 2 weeks the Patient will work with Child psychotherapist to address concerns related to food insecurity and financial strain. .  Interventions:  SW will provide pt resources to food pantries in his community. SW will also share information with Pt regarding SSDI and the Disability Advocacy Center.   Patient Goals/Self-Care Activities:  Call Department of Social Services [phone] to apply for food stamps. Attempt to apply for SSDI.   Plan:   Telephone follow up appointment with care management team member scheduled for:  12/13/2023 at 11am.         Please call the Suicide and Crisis Lifeline: 988 call the [place]  National Suicide Prevention Lifeline: 617 063 2213 or TTY: (219)826-1528 TTY [phone]) to talk to a trained counselor call 911 if you are experiencing a Mental Health or Behavioral Health Crisis or need someone to talk to.  Patient verbalizes understanding of instructions and care plan provided today and agrees to view in MyChart. Active MyChart status and patient understanding of how to access instructions and care plan via MyChart confirmed with patient.     Burt Casco, BSW [place]/VBCI - [employer] Social Worker [phone]

## 2023-11-29 NOTE — Patient Outreach (Signed)
 Complex Care Management   Visit Note  11/29/2023  Name:  Erik Santana. MRN: [id number] DOB: [birth year]  Situation: Referral received for Complex Care Management related to SDOH Barriers:  Food insecurity Financial Resource Strain I obtained verbal consent from Patient.  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 SW addressed SDOH barriers. Pt stated he would like to apply for food stamps and in the meantime receive information on food pantries in his community. Pt stated he has never applied for food stamps. Pt confirmed he has medicaid and confirmed he received a donor wheelchair until he gets his actual chair. Pt stated he should have it by the end of this week or next. SW will send resources via email on file. Pt agreed. Pt also stated he would like to apply for disability. SW will send information via email. SW asked pt if he thinks he could benefit from getting connected to a LCSW. Pt stated yes. SW will contact LCSW and schedule accordingly.   SDOH Interventions    Flowsheet Row Patient Outreach Telephone from 11/29/2023 in [place] [facility]PARTMENT Office Visit from 11/09/2023 in [facility]  SDOH Interventions    Food Insecurity Interventions Community Resources Provided Intervention Not Indicated, AMB Referral  Housing Interventions Intervention Not Indicated Intervention Not Indicated  Transportation Interventions Intervention Not Indicated Intervention Not Indicated  Utilities Interventions Intervention Not Indicated Intervention Not Indicated  Alcohol Usage Interventions -- Intervention Not Indicated (Score <7)  Financial Strain Interventions Community Resources Provided Intervention Not  Indicated  Physical Activity Interventions -- Intervention Not Indicated  Stress Interventions -- Intervention Not Indicated  Social Connections Interventions -- Intervention Not Indicated  Health Literacy Interventions -- Intervention Not Indicated         Recommendation:   None at this time.   Follow Up Plan:   Telephone follow up appointment date/time:  12/13/2023 at 11am.  Burt Casco, BSW [place]/VBCI - [facility] Social Worker [phone]

## 2023-11-29 NOTE — Patient Instructions (Signed)
 Visit Information  Thank you for taking time to visit with me today. Please don't hesitate to contact me if I can be of assistance to you before our next scheduled appointment.  Our next appointment is by telephone on 12/13/2023 at 11AM Please call the care guide team at [phone] if you need to cancel or reschedule your appointment.   Following is a copy of your care plan:   Goals Addressed   None     Please call the Suicide and Crisis Lifeline: 988 call the [place]  National Suicide Prevention Lifeline: 2056633774 or TTY: 970 207 9220 TTY [phone]) to talk to a trained counselor call 911 if you are experiencing a Mental Health or Behavioral Health Crisis or need someone to talk to.  Patient verbalizes understanding of instructions and care plan provided today and agrees to view in MyChart. Active MyChart status and patient understanding of how to access instructions and care plan via MyChart confirmed with patient.     Burt Casco, BSW [place]/VBCI - [employer] Social Worker [phone]

## 2023-11-30 ENCOUNTER — Ambulatory Visit: Payer: Self-pay | Admitting: Diagnostic Neuroimaging

## 2023-12-02 ENCOUNTER — Ambulatory Visit: Payer: Self-pay

## 2023-12-02 ENCOUNTER — Other Ambulatory Visit: Payer: Self-pay

## 2023-12-02 ENCOUNTER — Encounter: Admitting: Occupational Therapy

## 2023-12-02 ENCOUNTER — Ambulatory Visit: Admitting: Physical Therapy

## 2023-12-02 DIAGNOSIS — F0631 Mood disorder due to known physiological condition with depressive features: Secondary | ICD-10-CM

## 2023-12-02 NOTE — Telephone Encounter (Signed)
 FYI Only or Action Required?: Action required by provider  Patient was last seen in primary care on 11/09/2023 by Senaida Dama, NP. Called Nurse Triage reporting Seizures. Symptoms began yesterday. Interventions attempted: Rest, hydration, or home remedies. Symptoms are: stable.  Triage Disposition: See HCP Within 4 Hours (Or PCP Triage)   RN advised pt to go to ED. No in house provider today per CAL. Pt stated "ill think about it." [place] with CAL notified.  Patient/caregiver understands and will follow disposition?: No, refuses dispositionCopied from CRM [id number]. Topic: Clinical - Red Word Triage >> Dec 02, 2023  9:01 AM Rennis Case wrote: Red Word that prompted transfer to Nurse Triage: pt had seizure last night Answer Assessment - Initial Assessment Questions 1. ONSET: "When did the seizure occur?"     Last night  2. DURATION: "How long did the seizure last (or how long has it been happening)?" (e.g., seconds, minutes)  Note: Most seizures last less than 5 minutes.     5-10 3. DESCRIPTION: "Describe what happened during the seizure." "Did the body become stiff?" "Was there any jerking?"  "Did they lose consciousness during the seizure?"     Middle of sleep, has tremors 4. CIRCUMSTANCE: "What was the person doing when the seizure began?"      sleeping 5. MENTAL STATUS AFTER SEIZURE: "Does the person seem more groggy or sleepy?" "Does the person know who they are, who you are, and where they are now?"      Pt is AOx3 6. PRIOR SEIZURES: "Has the person had a seizure (convulsion) before?" (e.g., epilepsy, other cause)  If Yes, ask: "When was the last time?" and "What happened last time?"      yes 7. EPILEPSY: "Does the person have epilepsy?" Note: Check for medical ID bracelet.     Not sure  8. MEDICINES: "Does the person take anticonvulsant medications?" (e.g., Yes, No; missed doses, any recent changes)     no 9. INJURY: "Was the person hurt or injured during the seizure?" (e.g., hit their  head, bit their tongue)     Denies  10. OTHER SYMPTOMS: "Are there any other symptoms?" (e.g., fever, headache)       tremors  Protocols used: Seizure-A-AH  Reason for Disposition  [1] Seizure of unknown duration AND [2] history of prior seizure(s) AND [3] back to baseline with no new concerning symptoms  Answer Assessment - Initial Assessment Questions 1. ONSET: "When did the seizure occur?"     Last night  2. DURATION: "How long did the seizure last (or how long has it been happening)?" (e.g., seconds, minutes)  Note: Most seizures last less than 5 minutes.     5-10 3. DESCRIPTION: "Describe what happened during the seizure." "Did the body become stiff?" "Was there any jerking?"  "Did they lose consciousness during the seizure?"     Middle of sleep, has tremors 4. CIRCUMSTANCE: "What was the person doing when the seizure began?"      sleeping 5. MENTAL STATUS AFTER SEIZURE: "Does the person seem more groggy or sleepy?" "Does the person know who they are, who you are, and where they are now?"      Pt is AOx3 6. PRIOR SEIZURES: "Has the person had a seizure (convulsion) before?" (e.g., epilepsy, other cause)  If Yes, ask: "When was the last time?" and "What happened last time?"      yes 7. EPILEPSY: "Does the person have epilepsy?" Note: Check for medical ID bracelet.  Not sure  8. MEDICINES: "Does the person take anticonvulsant medications?" (e.g., Yes, No; missed doses, any recent changes)     no 9. INJURY: "Was the person hurt or injured during the seizure?" (e.g., hit their head, bit their tongue)     Denies  10. OTHER SYMPTOMS: "Are there any other symptoms?" (e.g., fever, headache)       tremors  Protocols used: Seizure-A-AH  Answer Assessment - Initial Assessment Questions 1. ONSET: "When did the seizure occur?"     Last night  2. DURATION: "How long did the seizure last (or how long has it been happening)?" (e.g., seconds, minutes)  Note: Most seizures last less than 5  minutes.     5-10 3. DESCRIPTION: "Describe what happened during the seizure." "Did the body become stiff?" "Was there any jerking?"  "Did they lose consciousness during the seizure?"     Middle of sleep, has tremors 4. CIRCUMSTANCE: "What was the person doing when the seizure began?"      sleeping 5. MENTAL STATUS AFTER SEIZURE: "Does the person seem more groggy or sleepy?" "Does the person know who they are, who you are, and where they are now?"      Pt is AOx3 6. PRIOR SEIZURES: "Has the person had a seizure (convulsion) before?" (e.g., epilepsy, other cause)  If Yes, ask: "When was the last time?" and "What happened last time?"      yes 7. EPILEPSY: "Does the person have epilepsy?" Note: Check for medical ID bracelet.     Not sure  8. MEDICINES: "Does the person take anticonvulsant medications?" (e.g., Yes, No; missed doses, any recent changes)     no 9. INJURY: "Was the person hurt or injured during the seizure?" (e.g., hit their head, bit their tongue)     Denies  10. OTHER SYMPTOMS: "Are there any other symptoms?" (e.g., fever, headache)       tremors  Protocols used: Seizure-A-AH  Reason for Disposition  Not on or ran out of seizure medicine (anticonvulsant)  Answer Assessment - Initial Assessment Questions 1. ONSET: "When did the seizure occur?"     Last night  2. DURATION: "How long did the seizure last (or how long has it been happening)?" (e.g., seconds, minutes)  Note: Most seizures last less than 5 minutes.     5-10 3. DESCRIPTION: "Describe what happened during the seizure." "Did the body become stiff?" "Was there any jerking?"  "Did they lose consciousness during the seizure?"     Middle of sleep, has tremors 4. CIRCUMSTANCE: "What was the person doing when the seizure began?"      sleeping 5. MENTAL STATUS AFTER SEIZURE: "Does the person seem more groggy or sleepy?" "Does the person know who they are, who you are, and where they are now?"      Pt is AOx3 6. PRIOR  SEIZURES: "Has the person had a seizure (convulsion) before?" (e.g., epilepsy, other cause)  If Yes, ask: "When was the last time?" and "What happened last time?"      yes 7. EPILEPSY: "Does the person have epilepsy?" Note: Check for medical ID bracelet.     Not sure  8. MEDICINES: "Does the person take anticonvulsant medications?" (e.g., Yes, No; missed doses, any recent changes)     no 9. INJURY: "Was the person hurt or injured during the seizure?" (e.g., hit their head, bit their tongue)     Denies  10. OTHER SYMPTOMS: "Are there any other symptoms?" (e.g., fever, headache)  tremors  Protocols used: [employer]

## 2023-12-02 NOTE — Patient Outreach (Signed)
 SW called Pt to inform him of a Electronics engineer ([employer]) that assists individuals who are un and under-insured to apply for SSI and/or SSDI benefits. General information regarding SSDI and local community resource was shared with patient via email.   While on the call, Pt confirmed with SW that he was able to visit his local DSS office in [facility] to apply for food stamps today. Pt states he was able to apply and was told he would need a letter from his PCP stating he cannot work. Pt plans to obtain this letter next week during one of his appointments.

## 2023-12-05 NOTE — Telephone Encounter (Signed)
 I spoke with patient he did not go to the Emergency I asked if he wanted to schedule an appointment he said no he feels better.  Patient has a appointment with MD from when he was in the hospital.   He stated he will call us  if anything changed.

## 2023-12-05 NOTE — Telephone Encounter (Signed)
 Report to Emergency Department/Urgent Care/call 911 for immediate medical evaluation. Follow-up with Primary Care.

## 2023-12-07 ENCOUNTER — Encounter: Attending: Physical Medicine and Rehabilitation | Admitting: Physical Medicine and Rehabilitation

## 2023-12-07 ENCOUNTER — Encounter: Payer: Self-pay | Admitting: Physical Medicine and Rehabilitation

## 2023-12-07 VITALS — BP 133/88 | HR 69 | Ht 75.0 in | Wt 198.0 lb

## 2023-12-07 DIAGNOSIS — G8194 Hemiplegia, unspecified affecting left nondominant side: Secondary | ICD-10-CM | POA: Insufficient documentation

## 2023-12-07 DIAGNOSIS — F4310 Post-traumatic stress disorder, unspecified: Secondary | ICD-10-CM | POA: Insufficient documentation

## 2023-12-07 DIAGNOSIS — R4586 Emotional lability: Secondary | ICD-10-CM | POA: Insufficient documentation

## 2023-12-07 DIAGNOSIS — R252 Cramp and spasm: Secondary | ICD-10-CM | POA: Diagnosis present

## 2023-12-07 DIAGNOSIS — R569 Unspecified convulsions: Secondary | ICD-10-CM | POA: Diagnosis not present

## 2023-12-07 DIAGNOSIS — I629 Nontraumatic intracranial hemorrhage, unspecified: Secondary | ICD-10-CM | POA: Diagnosis present

## 2023-12-07 MED ORDER — TIZANIDINE HCL 4 MG PO TABS: 4.0000 mg | ORAL_TABLET | Freq: Three times a day (TID) | ORAL | 3 refills | Status: DC

## 2023-12-07 MED ORDER — LEVETIRACETAM 500 MG PO TABS
500.0000 mg | ORAL_TABLET | Freq: Two times a day (BID) | ORAL | 3 refills | Status: DC
Start: 2023-12-07 — End: 2023-12-15

## 2023-12-07 MED ORDER — PRAZOSIN HCL 1 MG PO CAPS: 1.0000 mg | ORAL_CAPSULE | Freq: Every day | ORAL | 3 refills | Status: DC

## 2023-12-07 MED ORDER — QUETIAPINE FUMARATE 25 MG PO TABS: 25.0000 mg | ORAL_TABLET | Freq: Every evening | ORAL | 3 refills | Status: DC | PRN

## 2023-12-07 NOTE — Progress Notes (Signed)
 Subjective:    Patient ID: Erik Pollard., male    DOB: Dec 07, 1990, 33 y.o.   MRN: [id number]  HPI  Erik Ferrall. is a 33 y.o. 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   Plan from last visit:  Non-traumatic intracranial hemorrhage (HCC) Follow up with neurology and your family doctor as scheduled. Attend your therapies.   Continue Baclofen  15 mg three times daily; reduce tizanidine  to three times daily as needed for spasms. If your spasms improve in the next month or so such that you no longer need tizanidine , we can start to reduce the baclofen  as well.    Stop tramadol ; use tylenol  up to 1000 mg three times daily and ibuprofen up to 600 mg once daily for pain. Also continue lidoderm  patches.    Follow up in 2 months; we are likely to get labwork at this time. We can also discuss repeat botox at this time if needed.    Anxiety state Emotional lability For anxiety, increase Buspar  to 15 mg three times daily; continue depakote  500 mg daily and trazodone  decrease as tolerated; can take 50 to 100 mg nightly as needed. I recommend taking trazodone  closer to an hour before bedtime, around 8 oclock.    Make sure you have a behavioral health appointment; if not, message me and we will send out the referral again.    Left foot drop I am giving a script for a new AFO to provide more ankle support; continue bracing your foot at night. You can stop bracing your arm.    Discharge Note was reviewed  He reports he had stopped taking the Depakote  2 weeks ago, but notice some  changes in his mood. He has resumed the Depakote . He will continue on Depakote  and this message will be forward to Erik Santana for her input regarding the  above. He verbalizes understanding.  He seen Erik Santana in the hospital on 08/30/2023. Will place a refrral for Erik Santana he verbalizes understanding.   Interval Hx:  - Therapies: PT has been immensely helpful; continues with PT and OT.  Feels he is functionally improving considerably, not much limited by myoclonus recently.   - Follow ups:  He has his first psychiatry appointment at the end of this month.  Has MRI friday to follow up with Neurology.   Went to ER for seizures 5/25: Patient from home with tremors to his left lower extremity onset this evening. resolved after approximately 30 to 45 minutes.  Patient left the ER prior to recommended MRI/EEG by neurology.  He endorses this was due to anxiety   Mom says LLE tremors happened twice when coming out of sleep before this and he feels it coming ahead of time, he loses his sense of smell and feels an icy cold feeling in his nasal passages. He tingling in his left hand, and then goes into clonus in his left leg. Once it is over, he feels euphoric and tired. Longest one lasted 20 minutes, other ones have lasted about 5 minutes.    - Falls: none   - DME: waitiing for an AFO, walking with an elbow walker. Got a new AGFO because the other one setoff his clonus. Waiting for a WC  for distances.    - Medications: Got tramadol  from [facility], now on tylenol  which is not helpful.  Patient says he has been smoking marijuana a lot for anxiety; which has been helpful.    - Other concerns: Wants a letter for DSS for food stamps if possible  When he gets irritated now, he gets set off in a major way; any rushing, negativity. Talks it down and blows off steam - no longer gets violent with it, no SI or HI. If his mind is occupied when he is going to sleep, he has large emotional swings.  Pain Inventory Average Pain 5 Pain Right Now 6 My pain is intermittent, burning, tingling, and numb  LOCATION OF PAIN  Left finger tips, left toes, left  ankle  BOWEL Number of stools per week: 7 Oral laxative use No    BLADDER Normal    Mobility walk with assistance use a cane ability to climb steps?  yes do you drive?  no use a wheelchair transfers alone Do you have any goals in this area?  yes  Function disabled: date disabled applied I need assistance with the following:  household duties and shopping Do you have any goals in this area?  yes  Neuro/Psych weakness numbness tremor tingling trouble walking spasms dizziness confusion depression anxiety  Prior Studies Any changes since last visit?  yes CT/MRI Visit to [facility] on 11/20/2023.  Physicians involved in your care Any changes since last visit?  no   Family History  Problem Relation Age of Onset   Healthy Mother    Healthy Father    Social History   Socioeconomic History   Marital status: Single    Spouse name: Not on file   Number of children: Not on file   Years of education: Not on file   Highest education level: Not on file  Occupational History   Not on file  Tobacco Use   Smoking status: Former    Current packs/day: 0.50    Types: Cigarettes   Smokeless tobacco: Never  Vaping Use   Vaping status: Former  Substance and Sexual Activity   Alcohol use: Not Currently    Comment: occ   Drug use: No   Sexual activity: Not on file  Other Topics Concern   Not on file  Social History Narrative   Not on file   Social Drivers of Health   Financial Resource Strain: Medium Risk (11/29/2023)   Overall Financial Resource Strain (CARDIA)    Difficulty of Paying Living Expenses: Somewhat hard  Food Insecurity: Food Insecurity Present (11/29/2023)   Hunger Vital Sign    Worried About Running Out of Food in the Last Year: Often true    Ran Out of Food in the Last Year: Often true  Transportation Needs: Unmet Transportation Needs (11/29/2023)   PRAPARE - Administrator, Civil Service (Medical): Yes    Lack of Transportation  (Non-Medical): Yes  Physical Activity: Sufficiently Active (11/09/2023)   Exercise Vital Sign    Days of Exercise per Week: 5 days    Minutes of Exercise per Session: 30 min  Stress: No Stress Concern Present (11/09/2023)   [employer] of Occupational Health - Occupational Stress Questionnaire    Feeling of Stress : Only a little  Social Connections: Socially Isolated (11/09/2023)   Social Connection and Isolation Panel [NHANES]    Frequency of Communication with Friends and Family: More than three times a week    Frequency of  Social Gatherings with Friends and Family: More than three times a week    Attends Religious Services: Never    Database administrator or Organizations: No    Attends Banker Meetings: Never    Marital Status: Never married   Past Surgical History:  Procedure Laterality Date   ESOPHAGOGASTRODUODENOSCOPY N/A 10/18/2012   Procedure: ESOPHAGOGASTRODUODENOSCOPY (EGD);  Surgeon: Claudette Cue, MD;  Location: [facility];  Service: Endoscopy;  Laterality: N/A;   ESOPHAGOGASTRODUODENOSCOPY (EGD) WITH PROPOFOL  N/A 10/18/2012   Procedure: ESOPHAGOGASTRODUODENOSCOPY (EGD) WITH PROPOFOL ;  Surgeon: Claudette Cue, MD;  Location: [facility];  Service: Endoscopy;  Laterality: N/A;   FINGER SURGERY     IR ANGIO INTRA EXTRACRAN SEL COM CAROTID INNOMINATE BILAT MOD SED  08/23/2023   IR ANGIO VERTEBRAL SEL SUBCLAVIAN INNOMINATE UNI L MOD SED  08/23/2023   IR ANGIO VERTEBRAL SEL VERTEBRAL UNI R MOD SED  08/23/2023   OPEN REDUCTION INTERNAL FIXATION (ORIF) PROXIMAL PHALANX Right 10/12/2022   Procedure: Right ring finger middle phalanx and proximal interphalangeal joint closed reduction internal percutaneous pinning;  Surgeon: Ltanya Rummer, MD;  Location: [facility];  Service: Orthopedics;  Laterality: Right;   Past Medical History:  Diagnosis Date   ADD (attention deficit disorder)    Anxiety    Asthma    GERD (gastroesophageal reflux disease)     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   BP 133/88   Pulse 69   Ht 6' 3 (1.905 m)   Wt 198 lb (89.8 kg)   SpO2 97%   BMI 24.75 kg/m   Opioid Risk Score:   Fall Risk Score:  `1  Depression screen [facility] 2/9     12/07/2023    2:18 PM 11/09/2023   10:07 AM 10/05/2023   12:09 PM  Depression screen PHQ 2/9  Decreased Interest 1 2 1   Down, Depressed, Hopeless 1 1 2   PHQ - 2 Score 2 3 3   Altered sleeping  2 2  Tired, decreased energy  1 1  Change in appetite  1 2  Feeling bad or failure about yourself   2 1  Trouble concentrating  1 1  Moving slowly or fidgety/restless  1 2  Suicidal thoughts  1 1  PHQ-9 Score  12 13  Difficult doing work/chores  Somewhat difficult Somewhat difficult    Review of Systems  Neurological:  Positive for dizziness, tremors, weakness and numbness.       Spasms, tingling  Psychiatric/Behavioral:  Positive for confusion.        Depression, Anxiety  All other systems reviewed and are negative.      Objective:   Physical Exam Constitution: Appropriate appearance for age. No apparent distress   Resp: No respiratory distress. No accessory muscle usage. on RA and CTAB Cardio: Well perfused appearance. No peripheral edema. Abdomen: Nondistended. Nontender.   Psych: Appropriate mood and affect. Neuro: AAOx4. No apparent cognitive deficits    Neurologic Exam:   DTRs: Reflexes were 2+ patella, biceps, BR and triceps. Sensory exam: revealed normal sensation in all dermatomal regions in bilateral upper extremities and bilateral lower extremities   LUE: 5-/5 , full AROM,  fine motor intact LLE: MAS 2 L hamstring, MAS 1 quads, MAS 2 PF - clonus with DF--improved  Gait: Stiff, circumduction gait with LLE - forefront catching without AFO     Assessment & Plan:   Erik Klatt. is a 33 y.o. year old male  who  has a past medical history  of ADD (attention deficit disorder), Anxiety, Asthma, GERD (gastroesophageal reflux disease), and Mallory-Weiss tear  (09/26/2012).  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  Non-traumatic intracranial hemorrhage (HCC) Seizure-like activity (HCC)  Unfortunately, has been off of his Depakote  due to concerns for skin irritation/hair loss.  Not agreeable to resuming at low-dose.  start Keppra  500 mg twice daily for seizures;  Discussed with patient's neurologist; scheduling him for follow-up and EEG due to highly concerning features of seizure-like episodes when coming out of sleep.  Repeat MRI later this week  I will send you a letter for DSS and social security through mychart for food stamps and disability application  Left hemiparesis (HCC) Spasticity Continue therapies; advised patient to continue to use AFO if he is ambulating long distances or fatigue.  Stop baclofen  and scopalamine  Stop tramadol ; continue on tylenol  for pain control  Take tizanidine  4 mg three times daily for muscle tightness/spasms  Follow up with me remotely in 1 week and for a general follow up in 2 months; we may recommend botox for your leg again at that time  PTSD (post-traumatic stress disorder) Emotional lability  As above, patient stopped Depakote  and has had increased emotional lability since.  Not agreeable to resuming mood stabilizer at this time.  Does sound like patient is having panic attacks which worsen his myoclonus; lots of anxiety about potential ongoing seizures.  Start prazosin  1 mg nightly for nightmares and mood; this may lower your blood pressure so take when you are settling down for the evening.  Take seroquel  as needed for aggitation or restlessness  Other orders -     Prazosin  HCl; Take 1 capsule (1 mg total) by mouth at bedtime.  Dispense: 30 capsule; Refill: 3

## 2023-12-07 NOTE — Patient Instructions (Signed)
 Stop baclofen  and scopalamine  Stop tramadol ; continue on tylenol  for pain control  Take tizanidine  4 mg three times daily for muscle tightness/spasms  Start Keppra 500 mg twice daily for seizures; I will let you know if neurology changes this dose  Start prazosin 1 mg nightly for nightmares and mood; this may lower your blood pressure so take when you are settling down for the evening.  Schedule an appointment and EEG with neurology  I will send you a letter for DSS and social security through mychart for food stamps and disability application  Take seroquel as needed for aggitation or restlessness  Follow up with me remotely in 1 week and for a general follow up in 2 months; we may recommend botox for your leg again at that time

## 2023-12-08 ENCOUNTER — Telehealth: Payer: Self-pay

## 2023-12-08 ENCOUNTER — Other Ambulatory Visit: Payer: Self-pay

## 2023-12-08 DIAGNOSIS — I610 Nontraumatic intracerebral hemorrhage in hemisphere, subcortical: Secondary | ICD-10-CM

## 2023-12-08 DIAGNOSIS — F4310 Post-traumatic stress disorder, unspecified: Secondary | ICD-10-CM

## 2023-12-08 NOTE — Telephone Encounter (Signed)
 Provider requested EEG and VV after EEG completed based on Pt visit with PCP Dr. Dorn Gaskins. Both EEG and VV scheduled.

## 2023-12-08 NOTE — Telephone Encounter (Signed)
 PA for Rx Quetiapine Fumarate 25 MG submitted.   Approved today by [facility] Medicaid 2017 Approved. This drug has been approved. Approved quantity: 30 tablets per 30 day(s). You may fill up to a 34 day supply at a retail pharmacy. You may fill up to a 90 day supply for maintenance drugs, please refer to the formulary for details. Please call the pharmacy to process your prescription claim. Effective Date: 12/08/2023 Authorization Expiration Date: 12/07/2024

## 2023-12-08 NOTE — Telephone Encounter (Signed)
Patient pharmacy informed.

## 2023-12-09 ENCOUNTER — Ambulatory Visit
Admission: RE | Admit: 2023-12-09 | Discharge: 2023-12-09 | Disposition: A | Discharge status: Home / Self Care | Source: Ambulatory Visit | Attending: Diagnostic Neuroimaging | Admitting: Diagnostic Neuroimaging

## 2023-12-09 ENCOUNTER — Ambulatory Visit: Payer: Self-pay | Attending: Physician Assistant | Admitting: Physical Therapy

## 2023-12-09 VITALS — BP 142/87 | HR 81

## 2023-12-09 DIAGNOSIS — R2681 Unsteadiness on feet: Secondary | ICD-10-CM | POA: Diagnosis present

## 2023-12-09 DIAGNOSIS — I610 Nontraumatic intracerebral hemorrhage in hemisphere, subcortical: Secondary | ICD-10-CM | POA: Diagnosis not present

## 2023-12-09 DIAGNOSIS — R29818 Other symptoms and signs involving the nervous system: Secondary | ICD-10-CM | POA: Diagnosis present

## 2023-12-09 DIAGNOSIS — M6281 Muscle weakness (generalized): Secondary | ICD-10-CM | POA: Diagnosis present

## 2023-12-09 DIAGNOSIS — R2689 Other abnormalities of gait and mobility: Secondary | ICD-10-CM | POA: Diagnosis present

## 2023-12-09 DIAGNOSIS — R278 Other lack of coordination: Secondary | ICD-10-CM | POA: Diagnosis present

## 2023-12-09 MED ORDER — GADOPICLENOL 0.5 MMOL/ML IV SOLN
9.0000 mL | Freq: Once | INTRAVENOUS | Status: AC | PRN
  Administered 2023-12-09: 9 mL via INTRAVENOUS

## 2023-12-09 NOTE — Therapy (Signed)
 OUTPATIENT PHYSICAL THERAPY NEURO TREATMENT- RECERTIFICATION   Patient Name: Erik Santana. MRN: [id number] DOB:10/15/1990, 33 y.o., male 10 Date: 12/09/2023   PCP: Lavona Pounds, NP REFERRING PROVIDER: Sterling Eisenmenger, PA-C     END OF SESSION:  PT End of Session - 12/09/23 1431     Visit Number 11    Number of Visits 19    Date for PT Re-Evaluation 02/03/24    Authorization Type [place] Medicaid    PT Start Time 1427    PT Stop Time 1526    PT Time Calculation (min) 59 min    Equipment Utilized During Treatment --    Activity Tolerance Other (comment)   anxiety   Behavior During Therapy Anxious            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   Past Surgical History:  Procedure Laterality Date   ESOPHAGOGASTRODUODENOSCOPY N/A 10/18/2012   Procedure: ESOPHAGOGASTRODUODENOSCOPY (EGD);  Surgeon: Claudette Cue, MD;  Location: [facility];  Service: Endoscopy;  Laterality: N/A;   ESOPHAGOGASTRODUODENOSCOPY (EGD) WITH PROPOFOL  N/A 10/18/2012   Procedure: ESOPHAGOGASTRODUODENOSCOPY (EGD) WITH PROPOFOL ;  Surgeon: Claudette Cue, MD;  Location: [facility];  Service: Endoscopy;  Laterality: N/A;   FINGER SURGERY     IR ANGIO INTRA EXTRACRAN SEL COM CAROTID INNOMINATE BILAT MOD SED  08/23/2023   IR ANGIO VERTEBRAL SEL SUBCLAVIAN INNOMINATE UNI L MOD SED  08/23/2023   IR ANGIO VERTEBRAL SEL VERTEBRAL UNI R MOD SED  08/23/2023   OPEN REDUCTION INTERNAL FIXATION (ORIF) PROXIMAL PHALANX Right 10/12/2022   Procedure: Right ring finger middle phalanx and proximal interphalangeal joint closed reduction internal percutaneous pinning;  Surgeon: Ltanya Rummer, MD;  Location:  SURGERY CENTER;  Service: Orthopedics;  Laterality: Right;   Patient Active Problem List   Diagnosis Date Noted   PTSD (post-traumatic stress disorder) 12/07/2023    Left hemiparesis (HCC) 12/07/2023   Spasticity 12/07/2023   Emotional lability 10/05/2023   Left foot drop 10/05/2023   Cognitive change 08/30/2023   Intraparenchymal hematoma of brain (HCC) 08/24/2023   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   Anxiety state 10/27/2012   Hematemesis 10/18/2012   Other esophagitis 10/18/2012   Mallory-Weiss tear 10/18/2012    ONSET DATE: 09/22/2023 (referral date)  REFERRING DIAG: S06.33AA (ICD-10-CM) - Intraparenchymal hematoma of brain due to trauma [facility])  THERAPY DIAG:  Other abnormalities of gait and mobility  Muscle weakness (generalized)  Other lack of coordination  Unsteadiness on feet  Rationale for Evaluation and Treatment: Rehabilitation  SUBJECTIVE:  SUBJECTIVE STATEMENT: Patient arrives to clinic w/no AFO and no AD.   Pt accompanied by: self and mother, [name]  PERTINENT HISTORY: parenchymal hemorrhage centered at the right frontal parietal junction, intraparenchymal hematoma within the superior right frontal lobe, seizures   PAIN:  Are you having pain? 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 Yes. 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   OBJECTIVE:  Note: Objective measures were completed at Evaluation unless otherwise noted.  DIAGNOSTIC FINDINGS:   MR 08/22/2023: IMPRESSION: Unchanged appearance of intraparenchymal hematoma within the superior right frontal lobe. No mass lesion.  COGNITION: Overall cognitive status: Within functional limits for tasks assessed   VITALS:  Vitals:   12/09/23 1444  BP: (!) 142/87  Pulse: 81                                                                                                  TREATMENT:   Self-care/home management/Ther Act  Supine lying w/feet elevated  Cold compress  Deep breathing  Assessed vitals  Massage/stretch to L foot    PATIENT EDUCATION: Education details: L ankle eversion to target, f/u with PCP re: anxiety rx management, see above Person educated: Patient and Parent Education method: Explanation and Demonstration Education comprehension: verbalized understanding, returned demonstration, and needs further education  HOME EXERCISE PROGRAM: Access Code: [email or web address] URL: [email or web address] Date: 11/01/2023 Prepared by: Camella Cave  Exercises - Staggered Sit-to-Stand  - 1 x daily - 7 x weekly - 3 sets - 10 reps - Backward Walking with Counter Support  - 1 x daily - 7 x weekly - 5 sets  Access Code: [email or web address] URL: [email or web address] Date: 11/04/2023 Prepared by: Burleigh Carp Azaya Goedde  Exercises - Calf stretch  - 1 x daily - 7 x weekly - 60-90 seconds hold - Squat with Resistance at Thighs  - 1 x daily - 7 x weekly - 3 sets - 10 reps - Side Stepping with Resistance at Thighs  - 1 x daily - 7 x weekly - 3 sets - 10 reps  GOALS: Goals reviewed with patient? Yes  SHORT TERM GOALS: Target date: 11/18/23  Patient will report demonstrate independence with initial HEP in order to maintain current gains and continue to progress after physical therapy  discharge.   Baseline: To be provided ; provided Goal status: MET  2.  to be assessed / LTG written Baseline: To  be assessed; 267' SBA, no AD + AFO Goal status: MET   LONG TERM GOALS: Target date: 12/16/2023  Patient will report demonstrate independence with final HEP in order to maintain current gains and continue to progress after physical therapy discharge.   Baseline: To be provided  Goal status: INITIAL  2.  Patient will improve gait speed to 0.5 m/s or greater to indicate an improvement from being a household ambulator to limited community ambulator.   Baseline: 0.34 m/s with R lofstrand crutch and L AFO (SBA) Goal status: INITIAL  3.  Patient will improve their 5x Sit to Stand score to less than 15 seconds to demonstrate a decreased risk for falls and improved LE strength.   Baseline: 18.97 seconds without UE use Goal status: INITIAL  4.  Patient will ambulate at least 333ft during with LRAD to demonstrate improved community ambulation tolerance Baseline: 267' SBA no AD + AFO Goal status: REVISED  ASSESSMENT:  CLINICAL IMPRESSION: Patient seen for skilled PT session with emphasis on patient education and STG assessment. He met 2/2 STG. 2 Min Walk Test:  Instructed patient to ambulate as quickly and as safely as possible for 2 minutes using LRAD. Patient was allowed to take standing rest breaks without stopping the test, but if the patient required a sitting rest break the clock would be stopped and the test would be over.  Results: 267 feet. Results indicate that the patient has reduced endurance with ambulation compared to age matched and population- specific norms.  Age Matched and population specific Norms: community dwelling elderly adults: 461ft (MDC: 56ft); MS: 567.81ft (MDC: 72ft); SCI: 358.69ft, Patient continues to report significant levels of anxiety that PT, patient and mom all observe manifesting as increased episodes of tone and clonus. This poses a  significant fall risk and greatly impacts his overall progress in both therapy and return to independent mobility. He would greatly benefit from appropriate management of his anxiety. Discussed non-pharmacological techniques with patient to assist with managing his anxiety. Elliptical used to simulate exaggerated gait pattern. With fatigue, patient resuming with L LE tibial IR (?) and ankle supination. Continue POC.    OBJECTIVE IMPAIRMENTS: Abnormal gait, decreased activity tolerance, decreased balance, decreased endurance, decreased mobility, difficulty walking, decreased ROM, decreased strength, increased fascial restrictions, impaired flexibility, impaired sensation, impaired tone, and pain.   ACTIVITY LIMITATIONS: carrying, standing, transfers, and locomotion level  PARTICIPATION LIMITATIONS: driving, community activity, occupation, and yard work  PERSONAL FACTORS: Age, Time since onset of injury/illness/exacerbation, Transportation, and 1-2 comorbidities: see above are also affecting patient's functional outcome.   REHAB POTENTIAL: Good  CLINICAL DECISION MAKING: Evolving/moderate complexity  EVALUATION COMPLEXITY: Moderate  PLAN:  PT FREQUENCY/DURATION: 2x/week for 4 weeks and then 1x week for 2 weeks   PLANNED INTERVENTIONS: 97164- PT Re-evaluation, 97110-Therapeutic exercises, 97530- Therapeutic activity, [id number]- Neuromuscular re-education, 97535- Self Care, 45409- Manual therapy, [id number]- Gait training, [id number]- Orthotic/Prosthetic subsequent, and [phone]- Aquatic Therapy  PLAN FOR NEXT SESSION: provide him with info on LYB, TM and elliptical. Don rigid AFO.  stretching program with emphasis on hamstring tightness, quad strengthening as able, could consider trial of Bioness, high intensity gait training, continue conversation about additional botox (recommended vs not pending gait), L NMR re-ed  review functional lift technique as appropriate (dead lift hip hinge)   For all possible  CPT codes, reference the Planned Interventions line above.     Check all conditions that are expected to impact treatment: {Conditions expected to impact treatment:Contractures, spasticity or fracture  relevant to requested treatment and Neurological condition and/or seizures   If treatment provided at initial evaluation, no treatment charged due to lack of authorization.      Lovinia Snare E Genaro Bekker, PT, DPT  12/09/2023, 3:33 PM

## 2023-12-13 ENCOUNTER — Other Ambulatory Visit: Payer: Self-pay

## 2023-12-13 NOTE — Patient Outreach (Signed)
 SW conducted follow-up call with Pt today. Pt stated he was doing well and was appreciative of the support being provided to him. Pt confirmed with SW he applied for food stamps and was approved on 06/09. Pt states he has received his EBT card and was instructed on how to use it. Pt states his monthly benefit amount is $260/month. SW provided patient additional food resources in [place] using the [place] [email or web address] referral method. Pt also confirmed he received his actual wheelchair and his donor chair was returned back to the hospital.   In regards to SSDI, Pt confirmed he spoke with Rheba Cedar [phone]. [phone]) with [facility] in [place]. Pt was told he needed a diagnosis letter from his doctor stating his disability. Pt plans to ask about this letter in his upcoming appointment this Thursday. Pt was asked if he needed additional resources and pt stated no. SW reminded Pt about his upcoming appointment with LCSW Alease Hunter and Pt was aware. SW and Pt agreed to follow-up on 12/22/2023 at 11:30am regarding the SSDI letter.   Burt Casco, BSW [place]/VBCI - [employer] Social Worker [phone]

## 2023-12-13 NOTE — Patient Instructions (Signed)
 Visit Information  Mr. Omary was given information about Medicaid Managed Care team care coordination services as a part of their [facility] Medicaid benefit. Lauran Pollard. verbally consented to engagement with the [facility] Managed Care team.   If you are experiencing a medical emergency, please call 911 or report to your local emergency department or urgent care.   If you have a non-emergency medical problem during routine business hours, please contact your provider's office and ask to speak with a nurse.   For questions related to your [facility] health plan, please call: [phone] or go here:[email or web address]  If you would like to schedule transportation through your [facility] plan, please call the following number at least 2 days in advance of your appointment: [phone].   You can also use the MTM portal or MTM mobile app to manage your rides. Reimbursement for transportation is available through [facility]! For the portal, please go to [email or web address]  Call the [facility] Crisis Line at [phone], at any time, 24 hours a day, 7 days a week. If you are in danger or need immediate medical attention call 911.  If you would like help to quit smoking, call 1-800-QUIT-NOW (804-549-7239) OR Espaol: 1-855-Djelo-Ya (4-132-440-1027) o para ms informacin haga clic aqu or Text READY to 253-664 to register via text  Mr. Spiller - following are the goals we discussed in your visit today:   Goals Addressed             This Visit's Progress    BSW VBCI Social Work Care Plan   On track    Problems:   Air traffic controller Insecurity   CSW Clinical Goal(s):   Over the next 2 weeks the Patient will work with Child psychotherapist to address concerns related to food insecurity and financial strain. .  Interventions:  SW will provide pt resources to food pantries in his community. SW will also share information with Pt regarding SSDI and the  Disability Advocacy Center.   Patient Goals/Self-Care Activities:  Call Department of Social Services [phone] to apply for food stamps. Attempt to apply for SSDI.   Plan:   Telephone follow up appointment with care management team member scheduled for:  12/13/2023 at 11am.          Patient verbalizes understanding of instructions and care plan provided today and agrees to view in MyChart. Active MyChart status and patient understanding of how to access instructions and care plan via MyChart confirmed with patient.     Social Worker will follow up with Patient on 12/22/2023 at 11:30am.   Burt Casco, BSW [place]/VBCI - [facility] Social Worker [phone]   Following is a copy of your plan of care:  There are no care plans that you recently modified to display for this patient.

## 2023-12-15 ENCOUNTER — Encounter: Admitting: Physical Medicine and Rehabilitation

## 2023-12-15 DIAGNOSIS — G8194 Hemiplegia, unspecified affecting left nondominant side: Secondary | ICD-10-CM | POA: Diagnosis not present

## 2023-12-15 DIAGNOSIS — R252 Cramp and spasm: Secondary | ICD-10-CM | POA: Diagnosis not present

## 2023-12-15 DIAGNOSIS — F4310 Post-traumatic stress disorder, unspecified: Secondary | ICD-10-CM

## 2023-12-15 DIAGNOSIS — I629 Nontraumatic intracranial hemorrhage, unspecified: Secondary | ICD-10-CM | POA: Diagnosis not present

## 2023-12-15 MED ORDER — QUETIAPINE FUMARATE 25 MG PO TABS: 25.0000 mg | ORAL_TABLET | Freq: Two times a day (BID) | ORAL | 0 refills | Status: DC

## 2023-12-15 MED ORDER — DIAZEPAM 2 MG PO TABS: 2.0000 mg | ORAL_TABLET | Freq: Two times a day (BID) | ORAL | 0 refills | Status: DC | PRN

## 2023-12-15 MED ORDER — TIZANIDINE HCL 4 MG PO TABS: 4.0000 mg | ORAL_TABLET | Freq: Three times a day (TID) | ORAL | 3 refills | Status: DC

## 2023-12-15 MED ORDER — LEVETIRACETAM 250 MG PO TABS: ORAL_TABLET | ORAL | 0 refills | Status: DC

## 2023-12-15 NOTE — Progress Notes (Signed)
 Virtual Visit via Video Note  I connected with Erik Santana. on 12/15/23 at  1:00 PM EDT by a video enabled telemedicine application and verified that I am speaking with the correct person using two identifiers.  Location: Patient: home Provider: office   I discussed the limitations of evaluation and management by telemedicine and the availability of in person appointments. The patient expressed understanding and agreed to proceed.    The patient was advised to call back or seek an in-person evaluation if the symptoms worsen or if the condition fails to improve as anticipated.  I provided 27 minutes of non-face-to-face time during this encounter.  Erik Santana. is a 33 y.o. 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   They are presenting to PM&R clinic for follow up related to anxiety, seizure s/p TBI .  Plan from last visit: Stop baclofen  and scopalamine   Stop tramadol ; continue on tylenol  for pain control   Take tizanidine  4 mg three times daily for muscle tightness/spasms   Start Keppra  500 mg twice daily for seizures; I will let you know if neurology changes this dose   Start prazosin  1 mg nightly for nightmares and mood; this may lower your blood pressure so take when you are settling down for the evening.   Schedule an appointment and EEG with neurology   I will send you a letter for DSS and social security through mychart for food stamps and disability application   Take seroquel  as needed for aggitation or restlessness   Follow up with me remotely in 1 week and for a general follow up in 2 months; we may recommend botox for your leg again at that time   Interval Hx:  - Therapies: He has been unable to get through the last therapy sessions due to panic attacks. He says rught after my injury, I was more relaxed    - Follow ups:  Has BH appointment at the end of this this month; has EEG and neurology follow up next week. He had MRI last week which still showed some blood on it   - Falls: none    - Medications:  Seroquel  helps him calm down a bit, but he does not like the side effects. He says he has been taking it twice a day because he fels more irritated midday. He denies any lethargy.  He has been sleeping much better recently.   Keppra  500 mg BID - he notices that this has reduced his episodes quite a bit, but he still had a seizure-like episode 3 days ago. He says since then he has been doing a lot better. He had increased clonus but it helped when he relaxed.   Tizanidine  helps him relax his muscles especially at nighttime; gets good benefit from that.   He used to smoke tobacco to manage his anxiety and has not been able to do that recently.    - Other concerns:  feel like its the blood and my brain and my anxiety. He feels he is better able to control your emotions now since his head is waking up.    He says the past few days I have been more in tune with my thoughts..myalgias emotions are not as attached to my thoughts as they have been the last couple days. He is having fewer nightmares and less jumpy than he used to be.  He says this has to some extent always been an issue - since he was a small child, he has fight of flight when he is out of his comfort zone.   He says when he is at home/in familiar environments, he is much better; going to doctor's appointments and therapy appointments brings it out.   Loss of independence with his brain injury has been really difficult - he was living independently and taking care of his grandpa before this, and now. His grandpa is currently hospitalized for atrial fibrillation and he is really stressed over that. He denies SI/HI.   He gets a numb/tingling feeling    Exam: PE: Constitution: Appropriate appearance for age. No apparent distress  +Obese Resp:  No respiratory distress. No accessory muscle usage.  Cardio: Well perfused appearance.  Abdomen: Nondistended. Psych: Appropriate mood and affect. Neuro: AAOx4. No apparent cognitive deficits   Neurologic Exam:   Moving all 4 extremities antigravity and against resistance Coordination: Fine motor coordination was normal.   Gait: normal   Assessment and Plan: Erik Santana. is a 33 y.o. 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   They are presenting to PM&R clinic for follow up related to anxiety, seizure s/p TBI .  Non-traumatic intracranial hemorrhage (HCC)  Increase keppra  to 750 mg twice daily; prescribed 250 mg tabs to take with 500 mg tabs  Letter sent in support of disability and food stamps. Will reach out to  Leonor Endo: Disability specialist [phone] ext [phone]  Follow up with neurology for EEG and behavioral health at the end of this month; BH to take of psych meds/renew valium  at their discretion, discussed this with patient  Follow up in 2 months  PTSD (post-traumatic stress disorder) Increase seroquel  to 25 mg twice daily  Patient refused mood stabilizer  Continue buspar  at current dose  Prescribed temporary PRN valium  2 mg BID for panic attacks/seizure like episodes  Discussed mindfulness exercises with panic attacks  Left hemiparesis (HCC) Spasticity  Continue therapies; patient believes his participation will improve  Current exam appears improved, minimal residual deficits when not limited by anxiety/myoclonus  Other orders -     levETIRAcetam ; Take one 500 mg tablet and one 250 mg tablet together for a total of 750 mg, twice a day  Dispense: 60 tablet; Refill: 0 -     QUEtiapine  Fumarate; Take 1 tablet (25 mg total) by mouth 2 (two) times daily.  Dispense: 60 tablet; Refill: 0 -     tiZANidine  HCl; Take 1  tablet (4 mg total) by mouth 3 (three) times daily.  Dispense: 90 tablet; Refill: 3 -     diazePAM ; Take 1 tablet (2 mg total) by mouth 2 (two) times daily as needed for anxiety or muscle spasms (use for anxiety/seizure-like episodes).  Dispense: 60 tablet; Refill: 0

## 2023-12-15 NOTE — Patient Instructions (Signed)
 Increase seroquel  to 25 mg twice daily  Increase keppra  to 750 mg twice daily; prescribed 250 mg tabs to take with 500 mg tabs  Refilled tizanidine   Patient refused mood stabilizer  Continue buspar  at current dose  Prescribed temporary PRN valium 2 mg BID for panic attacks/seizure like episodes  Discussed mindfulness exercises with panic attacks  Follow up with neurology for EEG and behavioral health at the end of this month; BH to take of psych meds/renew valium at their discretion, discussed this with patient  Follow up with us  in 2 months

## 2023-12-16 ENCOUNTER — Ambulatory Visit: Admitting: Physical Therapy

## 2023-12-16 DIAGNOSIS — R569 Unspecified convulsions: Secondary | ICD-10-CM | POA: Insufficient documentation

## 2023-12-16 DIAGNOSIS — R2689 Other abnormalities of gait and mobility: Secondary | ICD-10-CM | POA: Diagnosis not present

## 2023-12-16 DIAGNOSIS — R2681 Unsteadiness on feet: Secondary | ICD-10-CM

## 2023-12-16 DIAGNOSIS — R29818 Other symptoms and signs involving the nervous system: Secondary | ICD-10-CM

## 2023-12-16 DIAGNOSIS — R278 Other lack of coordination: Secondary | ICD-10-CM

## 2023-12-16 DIAGNOSIS — M6281 Muscle weakness (generalized): Secondary | ICD-10-CM

## 2023-12-16 NOTE — Therapy (Signed)
 OUTPATIENT PHYSICAL THERAPY NEURO TREATMENT- RECERTIFICATION   Patient Name: Erik Santana. MRN: [id number] DOB:1991-04-27, 33 y.o., male 37 Date: 12/16/2023   PCP: Lavona Pounds, NP REFERRING PROVIDER: Sterling Eisenmenger, PA-C   END OF SESSION:  PT End of Session - 12/16/23 1454     Visit Number 12    Number of Visits 23   Recert   Date for PT Re-Evaluation 03/09/24    Authorization Type [place] Medicaid    Authorization Time Period 16 PT visits 10/25/2023 - 12/24/2023    PT Start Time 1448    PT Stop Time 1532    PT Time Calculation (min) 44 min    Equipment Utilized During Treatment Gait belt    Activity Tolerance Patient tolerated treatment well    Behavior During Therapy WFL for tasks assessed/performed;Anxious          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   Past Surgical History:  Procedure Laterality Date   ESOPHAGOGASTRODUODENOSCOPY N/A 10/18/2012   Procedure: ESOPHAGOGASTRODUODENOSCOPY (EGD);  Surgeon: Claudette Cue, MD;  Location: [facility];  Service: Endoscopy;  Laterality: N/A;   ESOPHAGOGASTRODUODENOSCOPY (EGD) WITH PROPOFOL  N/A 10/18/2012   Procedure: ESOPHAGOGASTRODUODENOSCOPY (EGD) WITH PROPOFOL ;  Surgeon: Claudette Cue, MD;  Location: [facility];  Service: Endoscopy;  Laterality: N/A;   FINGER SURGERY     IR ANGIO INTRA EXTRACRAN SEL COM CAROTID INNOMINATE BILAT MOD SED  08/23/2023   IR ANGIO VERTEBRAL SEL SUBCLAVIAN INNOMINATE UNI L MOD SED  08/23/2023   IR ANGIO VERTEBRAL SEL VERTEBRAL UNI R MOD SED  08/23/2023   OPEN REDUCTION INTERNAL FIXATION (ORIF) PROXIMAL PHALANX Right 10/12/2022   Procedure: Right ring finger middle phalanx and proximal interphalangeal joint closed reduction internal percutaneous pinning;  Surgeon: Ltanya Rummer, MD;  Location: [facility];  Service: Orthopedics;  Laterality:  Right;   Patient Active Problem List   Diagnosis Date Noted   Seizure-like activity (HCC) 12/16/2023   PTSD (post-traumatic stress disorder) 12/07/2023   Left hemiparesis (HCC) 12/07/2023   Spasticity 12/07/2023   Emotional lability 10/05/2023   Left foot drop 10/05/2023   Cognitive change 08/30/2023   Intraparenchymal hematoma of brain (HCC) 08/24/2023   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   Anxiety state 10/27/2012   Hematemesis 10/18/2012   Other esophagitis 10/18/2012   Mallory-Weiss tear 10/18/2012    ONSET DATE: 09/22/2023 (referral date)  REFERRING DIAG: S06.33AA (ICD-10-CM) - Intraparenchymal hematoma of brain due to trauma [facility])  THERAPY DIAG:  Other abnormalities of gait and mobility - Plan: PT plan of care cert/re-cert  Muscle weakness (generalized) - Plan: PT plan of care cert/re-cert  Other lack of coordination - Plan: PT plan of care cert/re-cert  Unsteadiness on feet - Plan: PT plan of care cert/re-cert  Other symptoms and signs involving the nervous system - Plan: PT plan of care cert/re-cert  Rationale for Evaluation and Treatment: Rehabilitation  SUBJECTIVE:  SUBJECTIVE STATEMENT: Patient arrives to clinic w/new AFO, no AD. States he feels a lot better today. Took valium about one hour ago. Feels like the AFO is rubbing his big toe too much so has not been wearing it much.   Pt accompanied by: self and mother, [name]  PERTINENT HISTORY: parenchymal hemorrhage centered at the right frontal parietal junction, intraparenchymal hematoma within the superior right frontal lobe, seizures   PAIN:  Are you having pain? 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 Yes. 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   OBJECTIVE:  Note: Objective measures were completed at Evaluation unless otherwise noted.  DIAGNOSTIC FINDINGS:   MR 08/22/2023: IMPRESSION: Unchanged appearance of intraparenchymal hematoma within the superior right frontal lobe. No mass lesion.  COGNITION: Overall cognitive status: Within functional limits for tasks assessed   VITALS:  There were no vitals filed for this visit.                                                                                                 TREATMENT:   Self-care/home management/Ther Act   Gait pattern: step through pattern, decreased arm swing- Left, decreased stride length, decreased hip/knee flexion- Left, circumduction- Left, and lateral hip instability Distance walked: >200' indoors and >500' outside on unlevel terrain  Assistive device utilized: L thermoplastic rigid AFO Level of assistance: SBA Comments: Practiced walking on unlevel terrain w/new AFO as pt reports he must walk on inclines/declines at home and cannot stabilize walking downhill in current brace due to lack of hinges. Pt demonstrates increased knee flexion w/use of brace but do think pt will have improved gait kinematics if AFO is hinged to allow for PF. Therapist contacted [name] at [facility] and [name] able to adjust brace today after session.    Discussed potential need for pt  to obtain larger shoes to accommodate brace so his midfoot is not cramped in shoe. Pt to wait until he gets brace adjusted to determine if he needs a larger shoe.  Alt fwd step w/10# slam ball over 30' w/SBA for improved lateral weight shifting, single leg stability and high velocity movement. Pt limited by inversion of L ankle but was able to stabilize independently. Cued pt to slow down as increased velocity will increase tone.   LTG Assessment   [facility] PT Assessment - 12/16/23 1515       Transfers  Five time sit to stand comments  15.81s   No UE support     Standardized Balance Assessment   10 Meter Walk 0.7 m/s   29m over 14.37s  no AD or AFO         Gait pattern: Extensor tone of LLE, step through pattern, decreased hip/knee flexion- Left, circumduction- Left, lateral hip instability, and poor foot clearance- Left Distance walked: 115' loop completed 3x plus 68' = 413' Assistive device utilized: None Level of assistance: SBA Comments: Cued pt to slow down as his extensor tone worsens w/increased velocity. Pt more stable w/reduced inversion/PF of L ankle w/decreased speed.   Discussed goals for PT moving forward and pt would like to work on jogging, boxing, bowling and marching legs w/resistance. Pt also wanting to play sports, such as basketball and football. PT goals updated to reflect pt's personal goals    PATIENT EDUCATION: Education details: Goal results, new goals for updated POC, potential to obtain larger shoes to fit AFO  Person educated: Patient and Parent Education method: Medical illustrator Education comprehension: verbalized understanding, returned demonstration, and needs further education  HOME EXERCISE PROGRAM: Access Code: [email or web address] URL: [email or web address] Date: 11/01/2023 Prepared by: Camella Cave  Exercises - Staggered Sit-to-Stand  - 1 x daily - 7 x weekly - 3 sets - 10 reps - Backward Walking with Counter Support  - 1 x  daily - 7 x weekly - 5 sets  Access Code: [email or web address] URL: [email or web address] Date: 11/04/2023 Prepared by: Burleigh Carp Conna Terada  Exercises - Calf stretch  - 1 x daily - 7 x weekly - 60-90 seconds hold - Squat with Resistance at Thighs  - 1 x daily - 7 x weekly - 3 sets - 10 reps - Side Stepping with Resistance at Thighs  - 1 x daily - 7 x weekly - 3 sets - 10 reps  GOALS: Goals reviewed with patient? Yes   LONG TERM GOALS: Target date: 12/16/2023  Patient will report demonstrate independence with final HEP in order to maintain current gains and continue to progress after physical therapy discharge.   Baseline: To be provided  Goal status: IN PROGRESS   2.  Patient will improve gait speed to 0.5 m/s or greater to indicate an improvement from being a household ambulator to limited community ambulator.   Baseline: 0.34 m/s with R lofstrand crutch and L AFO (SBA); 0.7 m/s no AD or AFO  Goal status: MET  3.  Patient will improve their 5x Sit to Stand score to less than 15 seconds to demonstrate a decreased risk for falls and improved LE strength.   Baseline: 18.97 seconds without UE use; 15.81s no UE support  Goal status: PARTIALLY MET   4.  Patient will ambulate at least 346ft during with LRAD to demonstrate improved community ambulation tolerance Baseline: 267' SBA no AD + AFO; 413' mod I no AD or AFO  Goal status: MET  NEW SHORT TERM GOALS FOR EXTENDED POC:   Target date: 01/13/2024  Pt will improve 5 x STS to less than or equal to 12 seconds w/o UE support to demonstrate improved functional strength and transfer efficiency.   Baseline: 15.81s no UE support (6/20) Goal status: INITIAL  2.  Pt will be able to bowl w/10# ball without LOB for improved functional mobility and return to sport  Baseline:  Goal status: INITIAL  3.  Pt will improve gait velocity to at least 0.9 m/s w/LRAD and AFO for improved gait efficiency  and independence   Baseline: 0.7 m/s no AD  or AFO Goal status: INITIAL   NEW LONG TERM GOALS FOR EXTENDED POC:  Target date: 02/10/2024  Pt will improve gait velocity to at least 1.1 m/s w/LRAD and AFO for improved gait efficiency and independence   Baseline: 0.7 m/s no AD or AFO Goal status: INITIAL  2.  to be assessed and LTG updated  Baseline:  Goal status: INITIAL  3.  Pt will tolerate 10 minutes of boxing w/BUEs for improved endurance, UE coordination and return to sport  Baseline:  Goal status: INITIAL  4.  Pt will be independent with final HEP for improved strength, balance, transfers and gait.  Baseline:  Goal status: IN PROGRESS   ASSESSMENT:  CLINICAL IMPRESSION: Emphasis of skilled PT session on LTG assessment, gait training on unlevel terrain and pt education. Pt much less anxious today and able to tolerate full therapy session. Pt brought in new AFO and is limited by lack of articulation at ankle, so will go to [facility] today to have hinge added to brace to allow for PF. Pt has met 2 of 4 LTGs, improving his gait speed and increasing his distance on without use of AD or AFO. Pt did improve his time on 5x STS, however, due to increased tone in LLE, narrowly missed his goal time. Pt would like to work on returning to sport, as this is something that helps his mental health. Pt most interested in bowling and boxing, so will incorporate in future sessions. Pt in agreement to recert at a frequency of 1x/week for 12 weeks to use up remainder of visits and then will [facility] to [facility] in the [facility]. Continue POC.    OBJECTIVE IMPAIRMENTS: Abnormal gait, decreased activity tolerance, decreased balance, decreased endurance, decreased mobility, difficulty walking, decreased ROM, decreased strength, increased fascial restrictions, impaired flexibility, impaired sensation, impaired tone, and pain.   ACTIVITY LIMITATIONS: carrying, standing, transfers, and locomotion level  PARTICIPATION LIMITATIONS:  driving, community activity, occupation, and yard work  PERSONAL FACTORS: Age, Time since onset of injury/illness/exacerbation, Transportation, and 1-2 comorbidities: see above are also affecting patient's functional outcome.   REHAB POTENTIAL: Good  CLINICAL DECISION MAKING: Evolving/moderate complexity  EVALUATION COMPLEXITY: Moderate  PLAN:  PT FREQUENCY/DURATION: 1x/week for 12 weeks (recert)   PLANNED INTERVENTIONS: 86578- PT Re-evaluation, 97110-Therapeutic exercises, 97530- Therapeutic activity, [id number]- Neuromuscular re-education, 97535- Self Care, 46962- Manual therapy, [id number]- Gait training, [id number]- Orthotic/Prosthetic subsequent, and [phone]- Aquatic Therapy  PLAN FOR NEXT SESSION: provide him with info on [facility], TM and elliptical. Don rigid AFO. Goals and recert.   stretching program with emphasis on hamstring tightness, quad strengthening as able, could consider trial of Bioness, high intensity gait training, continue conversation about additional botox (recommended vs not pending gait), L NMR re-ed  review functional lift technique as appropriate (dead lift hip hinge)   For all possible CPT codes, reference the Planned Interventions line above.     Check all conditions that are expected to impact treatment: {Conditions expected to impact treatment:Contractures, spasticity or fracture relevant to requested treatment and Neurological condition and/or seizures   If treatment provided at initial evaluation, no treatment charged due to lack of authorization.      Raelynne Ludwick E Carlyon Nolasco, PT, DPT 12/16/2023, 4:04 PM

## 2023-12-20 ENCOUNTER — Ambulatory Visit (INDEPENDENT_AMBULATORY_CARE_PROVIDER_SITE_OTHER): Admitting: Diagnostic Neuroimaging

## 2023-12-20 DIAGNOSIS — I610 Nontraumatic intracerebral hemorrhage in hemisphere, subcortical: Secondary | ICD-10-CM

## 2023-12-20 DIAGNOSIS — F4310 Post-traumatic stress disorder, unspecified: Secondary | ICD-10-CM

## 2023-12-21 ENCOUNTER — Other Ambulatory Visit: Payer: Self-pay | Admitting: Licensed Clinical Social Worker

## 2023-12-21 NOTE — Patient Outreach (Signed)
 BSW Contacted Mr. Leonor Duty with [facility] [phone]. [phone]) in [place] regarding seeking assistance for patient to apply for SSDI. SW left VM requesting call back.

## 2023-12-22 ENCOUNTER — Other Ambulatory Visit: Payer: Self-pay

## 2023-12-22 NOTE — Patient Outreach (Addendum)
 Complex Care Management   Visit Note  12/22/2023  Name:  Erik Santana. MRN: [id number] DOB: 09-05-1990  Situation: Referral received for Complex Care Management related to SDOH Barriers:  SDDI referral I obtained verbal consent from Patient.  Visit completed with patient  on the phone  Background:   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    Assessment: SW met with patient over the phone for follow-up call. Pt was alert and cognitive. Pt informed SW he was provide a letter from his PCP stating his doctor supports his decision to apply for SSDI due to his brain injury he has endured. Pt requested for SW to email letter to him. SW informed Patient he attempted to contact Leonor Duty with [facility] but no response was provided. SW mentioned other option through [employer] and he verbally confirmed he preferred to move forward with that option. Patient goal is to get disability application completed soon. SW will call this afternoon to confirm who patient needs to speak to and will f/u with patient via email/call-back. Pt agreed. SW educated patient about additional benefits potentially available through his [facility]; specifically home services (home delivered meals, housing & utility allowance, and supplemental transportation). Pt was provided member services number and strongly encouraged to call and confirm if he is eligible for such benefits. Pt understood and agreed to call. No additional resources provided at this time.   SDOH Interventions    Flowsheet Row Patient Outreach from 12/22/2023 in [place] POPULATION HEALTH DEPARTMENT Patient Outreach Telephone from 11/29/2023 in [place] POPULATION HEALTH DEPARTMENT Office Visit from 11/09/2023 in [facility] at [facility]  SDOH Interventions      Food Insecurity Interventions Community Resources Provided [employer] Provided Intervention Not Indicated, AMB Referral  Housing Interventions Intervention Not Indicated Intervention Not Indicated Intervention Not Indicated  Transportation Interventions Intervention Not Indicated Intervention Not Indicated Intervention Not Indicated  Utilities Interventions Community Resources Provided  [employer] - additional benefits - house/utility allowance.] Intervention Not Indicated Intervention Not Indicated  Alcohol Usage Interventions -- -- Intervention Not Indicated (Score <7)  Financial Strain Interventions -- [employer] Provided Intervention Not Indicated  Physical Activity Interventions -- -- Intervention Not Indicated  Stress Interventions -- -- Intervention Not Indicated  Social Connections Interventions -- -- Intervention Not Indicated  Health Literacy Interventions -- -- Intervention Not Indicated      Recommendation:   SW will call disability advocacy center and attempt to refer patient there for application assistance.   Follow Up Plan:   Telephone follow up appointment date/time:  12/29/2023 2pm  [name], BSW /VBCI - [facility] Social Worker [phone]

## 2023-12-22 NOTE — Patient Outreach (Signed)
 SW received a call from [name] Minus [phone]. [phone] with [facility]. SW received confirmation that they can help patient apply for disability. SW was informed that an in-person appointment at their office is preferred, but if patient cannot go physically they can come to the patients home for the appointment. SW and [name] agreed to have patient requesting assistance give her a call to schedule appointment.   SW called patient and explained the information provide by [name]. Patient understood and agreed to give her a call to set up an appointment after he speaks about it with his mother. Patient was provided direct contact info for [name]. Patient let SW know he has a PCP visit tomorrow and will request a letter from his provider containing names, addresses, and phone numbers of all the doctors he is working with for this disability application.

## 2023-12-22 NOTE — Patient Instructions (Signed)
 Visit Information  [name] was given information about Medicaid Managed Care team care coordination services as a part of their [facility] Medicaid benefit. Erik Santana. verbally consented to engagement with the [facility] Managed Care team.   If you are experiencing a medical emergency, please call 911 or report to your local emergency department or urgent care.   If you have a non-emergency medical problem during routine business hours, please contact your provider's office and ask to speak with a nurse.   For questions related to your [facility] health plan, please call: [phone] or go here:[email or web address]  If you would like to schedule transportation through your [facility] plan, please call the following number at least 2 days in advance of your appointment: [phone].   You can also use the MTM portal or MTM mobile app to manage your rides. Reimbursement for transportation is available through [facility]! For the portal, please go to [email or web address]  Call the [facility] Crisis Line at [phone], at any time, 24 hours a day, 7 days a week. If you are in danger or need immediate medical attention call 911.  If you would like help to quit smoking, call 1-800-QUIT-NOW (636-744-7264) OR Espaol: 1-855-Djelo-Ya (8-144-664-6430) o para ms informacin haga clic aqu or Text READY to 799-599 to register via text  Erik Santana - following are the goals we discussed in your visit today:   Goals Addressed   None      Patient verbalizes understanding of instructions and care plan provided today and agrees to view in MyChart. Active MyChart status and patient understanding of how to access instructions and care plan via MyChart confirmed with patient.     Telephone follow up appointment with Managed Medicaid care management team member scheduled for: 12/29/2023 at 2pm  [name], [place]/VBCI - [facility] Social  Worker [phone]   Following is a copy of your plan of care:  There are no care plans that you recently modified to display for this patient.

## 2023-12-23 ENCOUNTER — Encounter: Payer: Self-pay | Admitting: Family

## 2023-12-23 ENCOUNTER — Ambulatory Visit: Admitting: Physical Therapy

## 2023-12-23 ENCOUNTER — Ambulatory Visit (INDEPENDENT_AMBULATORY_CARE_PROVIDER_SITE_OTHER): Admitting: Family

## 2023-12-23 VITALS — BP 139/95 | HR 84

## 2023-12-23 VITALS — BP 133/89 | HR 97 | Temp 98.3°F | Resp 16 | Ht 75.0 in | Wt 206.2 lb

## 2023-12-23 DIAGNOSIS — R2681 Unsteadiness on feet: Secondary | ICD-10-CM

## 2023-12-23 DIAGNOSIS — Z0000 Encounter for general adult medical examination without abnormal findings: Secondary | ICD-10-CM

## 2023-12-23 DIAGNOSIS — E559 Vitamin D deficiency, unspecified: Secondary | ICD-10-CM

## 2023-12-23 DIAGNOSIS — Z130 Encounter for screening for diseases of the blood and blood-forming organs and certain disorders involving the immune mechanism: Secondary | ICD-10-CM

## 2023-12-23 DIAGNOSIS — Z13228 Encounter for screening for other metabolic disorders: Secondary | ICD-10-CM

## 2023-12-23 DIAGNOSIS — R2689 Other abnormalities of gait and mobility: Secondary | ICD-10-CM

## 2023-12-23 DIAGNOSIS — Z1329 Encounter for screening for other suspected endocrine disorder: Secondary | ICD-10-CM

## 2023-12-23 DIAGNOSIS — Z131 Encounter for screening for diabetes mellitus: Secondary | ICD-10-CM | POA: Diagnosis not present

## 2023-12-23 DIAGNOSIS — R278 Other lack of coordination: Secondary | ICD-10-CM

## 2023-12-23 DIAGNOSIS — Z1159 Encounter for screening for other viral diseases: Secondary | ICD-10-CM

## 2023-12-23 DIAGNOSIS — Z13220 Encounter for screening for lipoid disorders: Secondary | ICD-10-CM

## 2023-12-23 DIAGNOSIS — J3089 Other allergic rhinitis: Secondary | ICD-10-CM

## 2023-12-23 DIAGNOSIS — M6281 Muscle weakness (generalized): Secondary | ICD-10-CM

## 2023-12-23 MED ORDER — LORATADINE 10 MG PO TABS: 10.0000 mg | ORAL_TABLET | Freq: Every day | ORAL | 0 refills | Status: DC

## 2023-12-23 MED ORDER — VITAMIN D (ERGOCALCIFEROL) 1.25 MG (50000 UNIT) PO CAPS: 50000.0000 [IU] | ORAL_CAPSULE | ORAL | 0 refills | Status: AC

## 2023-12-23 NOTE — Therapy (Signed)
 OUTPATIENT PHYSICAL THERAPY NEURO TREATMENT   Patient Name: Erik Santana. MRN: [id number] DOB:[birth year], 33 y.o., male 90 Date: 12/23/2023   PCP: Greig Drones, NP REFERRING PROVIDER: Pegge Toribio PARAS, PA-C   END OF SESSION:  PT End of Session - 12/23/23 1452     Visit Number 13    Number of Visits 23   Recert   Date for PT Re-Evaluation 03/09/24    Authorization Type [place] Medicaid    Authorization Time Period 16 PT visits 10/25/2023 - 12/24/2023    PT Start Time 1450   Pt arrived late   PT Stop Time 1531    PT Time Calculation (min) 41 min    Equipment Utilized During Treatment Gait belt;Other (comment)   L AFO   Activity Tolerance Patient tolerated treatment well    Behavior During Therapy [facility] for tasks assessed/performed          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   Past Surgical History:  Procedure Laterality Date   ESOPHAGOGASTRODUODENOSCOPY N/A 10/18/2012   Procedure: ESOPHAGOGASTRODUODENOSCOPY (EGD);  Surgeon: Lamar JONETTA Aho, MD;  Location: [facility];  Service: Endoscopy;  Laterality: N/A;   ESOPHAGOGASTRODUODENOSCOPY (EGD) WITH PROPOFOL  N/A 10/18/2012   Procedure: ESOPHAGOGASTRODUODENOSCOPY (EGD) WITH PROPOFOL ;  Surgeon: Lamar JONETTA Aho, MD;  Location: [facility];  Service: Endoscopy;  Laterality: N/A;   FINGER SURGERY     IR ANGIO INTRA EXTRACRAN SEL COM CAROTID INNOMINATE BILAT MOD SED  08/23/2023   IR ANGIO VERTEBRAL SEL SUBCLAVIAN INNOMINATE UNI L MOD SED  08/23/2023   IR ANGIO VERTEBRAL SEL VERTEBRAL UNI R MOD SED  08/23/2023   OPEN REDUCTION INTERNAL FIXATION (ORIF) PROXIMAL PHALANX Right 10/12/2022   Procedure: Right ring finger middle phalanx and proximal interphalangeal joint closed reduction internal percutaneous pinning;  Surgeon: Alyse Agent, MD;  Location: [facility];  Service:  Orthopedics;  Laterality: Right;   Patient Active Problem List   Diagnosis Date Noted   Seizure-like activity (HCC) 12/16/2023   PTSD (post-traumatic stress disorder) 12/07/2023   Left hemiparesis (HCC) 12/07/2023   Spasticity 12/07/2023   Emotional lability 10/05/2023   Left foot drop 10/05/2023   Cognitive change 08/30/2023   Intraparenchymal hematoma of brain (HCC) 08/24/2023   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   Anxiety state 10/27/2012   Hematemesis 10/18/2012   Other esophagitis 10/18/2012   Mallory-Weiss tear 10/18/2012    ONSET DATE: 09/22/2023 (referral date)  REFERRING DIAG: S06.33AA (ICD-10-CM) - Intraparenchymal hematoma of brain due to trauma [facility]  THERAPY DIAG:  Other abnormalities of gait and mobility  Muscle weakness (generalized)  Other lack of coordination  Unsteadiness on feet  Rationale for Evaluation and Treatment: Rehabilitation  SUBJECTIVE:  SUBJECTIVE STATEMENT: Patient arrives to clinic holding AFO and L[place]strand. Reports he had a physical immediately prior to this session. Denies falls or pain. States he had his EEG but is waiting for his results.   Pt accompanied by: self and mother, [name]  PERTINENT HISTORY: parenchymal hemorrhage centered at the right frontal parietal junction, intraparenchymal hematoma within the superior right frontal lobe, seizures   PAIN:  Are you having pain? 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 Yes. 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   OBJECTIVE:  Note: Objective measures were completed at Evaluation unless otherwise noted.  DIAGNOSTIC FINDINGS:   MR 08/22/2023: IMPRESSION: Unchanged appearance of intraparenchymal hematoma within the superior right frontal lobe. No mass lesion.  COGNITION: Overall cognitive status: Within functional limits for tasks assessed   VITALS:  Vitals:   12/23/23 1455  BP: (!) 139/95  Pulse: 84                                                                                                   TREATMENT:   Self-care/home management  Assessed vitals (see above) and diastolic BP elevated but pt reports he was rushing to get to appointment in the rain.   NMR  Soccer drills in fwd/retro/lateral directions for improved proprioception of LLE, single leg stability and LE coordination. Pt required CGA-min A for stability and max verbal cues to slow down and focus on light touch of ball rather than stepping on/kicking ball. Initially performed without AFO donned, but too challenging for pt so he donned AFO. Performed each direction once over 30' walkway. Pt most challenged w/lateral dribbling to R side due to poor adduction of LLE and retro dribbling.  In // bars, resisted LLE toe taps to 6 step using red resistance band, x20 reps w/BUE support. Pt initially dysmetric w/movement but quickly was able to smooth path and clear step well w/LLE. Pt reported he could fly following activity.  Staggered stance RDLs w/rotation towards stance leg, x20 reps w/10# KB for improved stability on LLE and facilitation of hip  IR. Performed w/LLE fwd only. Mod verbal cues to avoid squatting rather than lifting CGA throughout.  Pt performed floor transfer to red floor mat w/SBA and performed tall kneel mini squats on bosu (blue side) x15 reps for improved proximal stability. CGA throughout w/min tactile cues applied at pelvis to facilitate weight shift  to L side. Pt able to perform transfer from tall kneel on floor to standing w/SBA.    PATIENT EDUCATION: Education details: Continue HEP, next appointment date/time  Person educated: Patient and Parent Education method: Medical illustrator Education comprehension: verbalized understanding, returned demonstration, and needs further education  HOME EXERCISE PROGRAM: Access Code: [email or web address] URL: [email or web address] Date: 11/01/2023 Prepared by: Lauraine Grumbling  Exercises - Staggered Sit-to-Stand  - 1 x daily - 7 x weekly - 3 sets - 10 reps - Backward Walking with Counter Support  - 1 x daily - 7 x weekly - 5 sets  Access Code: [email or web address] URL: [email or web address] Date: 11/04/2023 Prepared by: Marlon Zellie Jenning  Exercises - Calf stretch  - 1 x daily - 7 x weekly - 60-90 seconds hold - Squat with Resistance at Thighs  - 1 x daily - 7 x weekly - 3 sets - 10 reps - Side Stepping with Resistance at Thighs  - 1 x daily - 7 x weekly - 3 sets - 10 reps  GOALS: Goals reviewed with patient? Yes   LONG TERM GOALS: Target date: 12/16/2023  Patient will report demonstrate independence with final HEP in order to maintain current gains and continue to progress after physical therapy discharge.   Baseline: To be provided  Goal status: IN PROGRESS   2.  Patient will improve gait speed to 0.5 m/s or greater to indicate an improvement from being a household ambulator to limited community ambulator.   Baseline: 0.34 m/s with R lofstrand crutch and L AFO (SBA); 0.7 m/s no AD or AFO  Goal status: MET  3.  Patient will improve their 5x Sit to  Stand score to less than 15 seconds to demonstrate a decreased risk for falls and improved LE strength.   Baseline: 18.97 seconds without UE use; 15.81s no UE support  Goal status: PARTIALLY MET   4.  Patient will ambulate at least 330ft during with LRAD to demonstrate improved community ambulation tolerance Baseline: 267' SBA no AD + AFO; 413' mod I no AD or AFO  Goal status: MET  NEW SHORT TERM GOALS FOR EXTENDED POC:   Target date: 01/13/2024  Pt will improve 5 x STS to less than or equal to 12 seconds w/o UE support to demonstrate improved functional strength and transfer efficiency.   Baseline: 15.81s no UE support (6/20) Goal status: INITIAL  2.  Pt will be able to bowl w/10# ball without LOB for improved functional mobility and return to sport  Baseline:  Goal status: INITIAL  3.  Pt will improve gait velocity to at least 0.9 m/s w/LRAD and AFO for improved gait efficiency and independence   Baseline: 0.7 m/s no AD or AFO Goal status: INITIAL   NEW LONG TERM GOALS FOR EXTENDED POC:  Target date: 02/10/2024  Pt will improve gait velocity to at least 1.1 m/s w/LRAD and AFO for improved gait efficiency and independence   Baseline: 0.7 m/s no AD or AFO Goal status: INITIAL  2.  to be assessed and LTG updated  Baseline:  Goal status: INITIAL  3.  Pt will tolerate 10 minutes of boxing w/BUEs for improved endurance, UE coordination and return to sport  Baseline:  Goal status: INITIAL  4.  Pt will be independent with final HEP for improved strength, balance, transfers and gait.  Baseline:  Goal status: IN PROGRESS   ASSESSMENT:  CLINICAL IMPRESSION: Emphasis of skilled PT session on BLE coordination, facilitation of hip IR on LLE,  single leg and proximal stability. Pt reports feeling much better since his medications were changed and denies any more episodes. Pt requires max multimodal cues to slow down and focus on quality of movement w/coordination tasks,  as pt tends to move too quickly and throw himself off balance. Pt enjoys sport-based rehab and states he would like to return to his bowling league. Continue POC.    OBJECTIVE IMPAIRMENTS: Abnormal gait, decreased activity tolerance, decreased balance, decreased endurance, decreased mobility, difficulty walking, decreased ROM, decreased strength, increased fascial restrictions, impaired flexibility, impaired sensation, impaired tone, and pain.   ACTIVITY LIMITATIONS: carrying, standing, transfers, and locomotion level  PARTICIPATION LIMITATIONS: driving, community activity, occupation, and yard work  PERSONAL FACTORS: Age, Time since onset of injury/illness/exacerbation, Transportation, and 1-2 comorbidities: see above are also affecting patient's functional outcome.   REHAB POTENTIAL: Good  CLINICAL DECISION MAKING: Evolving/moderate complexity  EVALUATION COMPLEXITY: Moderate  PLAN:  PT FREQUENCY/DURATION: 1x/week for 12 weeks (recert)   PLANNED INTERVENTIONS: 02835- PT Re-evaluation, 97110-Therapeutic exercises, 97530- Therapeutic activity, [id number]- Neuromuscular re-education, 97535- Self Care, 02859- Manual therapy, [id number]- Gait training, [id number]- Orthotic/Prosthetic subsequent, and [phone]- Aquatic Therapy  PLAN FOR NEXT SESSION: provide him with info on [facility]. Work on sports Advice worker - basketball dribbling, return to bowling, boxing   stretching program with emphasis on hamstring tightness, quad strengthening as able, high intensity gait training, continue conversation about additional botox (recommended vs not pending gait), L NMR re-ed   For all possible CPT codes, reference the Planned Interventions line above.     Check all conditions that are expected to impact treatment: {Conditions expected to impact treatment:Contractures, spasticity or fracture relevant to requested treatment and Neurological condition and/or seizures   If treatment provided at initial evaluation,  no treatment charged due to lack of authorization.      Jeneen Doutt E Ermalinda Joubert, PT, DPT 12/23/2023, 3:32 PM

## 2023-12-23 NOTE — Progress Notes (Signed)
 Need vitamin D  prescribe.  He has already had a lab at neurologist, needs allergy medication, patient scored a 17 on GAD-7

## 2023-12-23 NOTE — Progress Notes (Signed)
 Patient ID: Erik Santana., male    DOB: Feb 16, 1991  MRN: [id number]  CC: Annual Exam  Subjective: Erik Santana is a 33 y.o. male who presents for annual exam. He is accompanied by his mother.  His concerns today include:  - Needs refills of Claritin . - Needs refills of Vitamin D .  - Reports upcoming appointment with Psychiatry. He denies thoughts of self-harm, suicidal ideations, homicidal ideations.  Patient Active Problem List   Diagnosis Date Noted   Seizure-like activity (HCC) 12/16/2023   PTSD (post-traumatic stress disorder) 12/07/2023   Left hemiparesis (HCC) 12/07/2023   Spasticity 12/07/2023   Emotional lability 10/05/2023   Left foot drop 10/05/2023   Cognitive change 08/30/2023   Intraparenchymal hematoma of brain (HCC) 08/24/2023   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   Anxiety state 10/27/2012   Hematemesis 10/18/2012   Other esophagitis 10/18/2012   Mallory-Weiss tear 10/18/2012     Current Outpatient Medications on File Prior to Visit  Medication Sig Dispense Refill   busPIRone  (BUSPAR ) 10 MG tablet Take 1.5 tablets (15 mg total) by mouth 3 (three) times daily. 135 tablet 3   diazepam  (VALIUM ) 2 MG tablet Take 1 tablet (2 mg total) by mouth 2 (two) times daily as needed for anxiety or muscle spasms (use for anxiety/seizure-like episodes). 60 tablet 0   levETIRAcetam  (KEPPRA ) 250 MG tablet Take one 500 mg tablet and one 250 mg tablet together for a total of 750 mg, twice a day 60 tablet 0   meclizine  (ANTIVERT ) 25 MG tablet Take 1 tablet (25 mg total) by mouth 3 (three) times daily as needed for dizziness. 30 tablet 3   prazosin  (MINIPRESS ) 1 MG capsule Take 1 capsule (1 mg total) by mouth at bedtime. 30 capsule 3   QUEtiapine  (SEROQUEL ) 25 MG tablet Take 1 tablet (25 mg total) by mouth 2 (two) times daily. 60 tablet 0   tiZANidine  (ZANAFLEX ) 4 MG tablet Take 1 tablet (4 mg total) by mouth 3 (three) times daily. 90 tablet 3    acetaminophen  (TYLENOL ) 325 MG tablet Take 1-2 tablets (325-650 mg total) by mouth every 6 (six) hours as needed for mild pain (pain score 1-3), headache or moderate pain (pain score 4-6) (325 mg for pain 1-3, 6500 mg for pain 4-6). (Patient not taking: Reported on 12/21/2023)     hydrOXYzine  (ATARAX ) 25 MG tablet Take 1 tablet (25 mg total) by mouth 3 (three) times daily as needed for anxiety. (Patient not taking: Reported on 12/21/2023) 30 tablet 3   lidocaine  (LIDODERM ) 5 % Place 1 patch onto the skin every 12 (twelve) hours as needed. Remove & Discard patch within 12 hours or as directed by MD (Patient not taking: Reported on 12/21/2023) 30 patch 3   No current facility-administered medications on file prior to visit.    Allergies  Allergen Reactions   Latex Hives   Nsaids Other (See Comments)    History of a Mallory-Weiss tear (had surgery to fix this 10+ years ago)    Social History   Socioeconomic History   Marital status: Single    Spouse name: Not on file   Number of children: Not on file   Years of education: Not on file   Highest education level: Not on file  Occupational History   Not on file  Tobacco Use   Smoking status: Former    Current packs/day: 0.50    Types: Cigarettes   Smokeless tobacco: Never  Vaping Use   Vaping  status: Former  Substance and Sexual Activity   Alcohol use: Not Currently    Comment: occ   Drug use: No   Sexual activity: Not on file  Other Topics Concern   Not on file  Social History Narrative   Not on file   Social Drivers of Health   Financial Resource Strain: Medium Risk (11/29/2023)   Overall Financial Resource Strain (CARDIA)    Difficulty of Paying Living Expenses: Somewhat hard  Food Insecurity: Food Insecurity Present (12/22/2023)   Hunger Vital Sign    Worried About Running Out of Food in the Last Year: Sometimes true    Ran Out of Food in the Last Year: Sometimes true  Transportation Needs: Unmet Transportation Needs  (12/22/2023)   PRAPARE - Transportation    Lack of Transportation (Medical): Yes    Lack of Transportation (Non-Medical): Yes  Physical Activity: Sufficiently Active (11/09/2023)   Exercise Vital Sign    Days of Exercise per Week: 5 days    Minutes of Exercise per Session: 30 min  Stress: No Stress Concern Present (11/09/2023)   [employer] of Occupational Health - Occupational Stress Questionnaire    Feeling of Stress : Only a little  Social Connections: Socially Isolated (11/09/2023)   Social Connection and Isolation Panel    Frequency of Communication with Friends and Family: More than three times a week    Frequency of Social Gatherings with Friends and Family: More than three times a week    Attends Religious Services: Never    Database administrator or Organizations: No    Attends Banker Meetings: Never    Marital Status: Never married  Intimate Partner Violence: Not At Risk (12/22/2023)   Humiliation, Afraid, Rape, and Kick questionnaire    Fear of Current or Ex-Partner: No    Emotionally Abused: No    Physically Abused: No    Sexually Abused: No    Family History  Problem Relation Age of Onset   Healthy Mother    Healthy Father     Past Surgical History:  Procedure Laterality Date   ESOPHAGOGASTRODUODENOSCOPY N/A 10/18/2012   Procedure: ESOPHAGOGASTRODUODENOSCOPY (EGD);  Surgeon: Lamar JONETTA Aho, MD;  Location: [facility];  Service: Endoscopy;  Laterality: N/A;   ESOPHAGOGASTRODUODENOSCOPY (EGD) WITH PROPOFOL  N/A 10/18/2012   Procedure: ESOPHAGOGASTRODUODENOSCOPY (EGD) WITH PROPOFOL ;  Surgeon: Lamar JONETTA Aho, MD;  Location: [facility];  Service: Endoscopy;  Laterality: N/A;   FINGER SURGERY     IR ANGIO INTRA EXTRACRAN SEL COM CAROTID INNOMINATE BILAT MOD SED  08/23/2023   IR ANGIO VERTEBRAL SEL SUBCLAVIAN INNOMINATE UNI L MOD SED  08/23/2023   IR ANGIO VERTEBRAL SEL VERTEBRAL UNI R MOD SED  08/23/2023   OPEN REDUCTION INTERNAL FIXATION (ORIF)  PROXIMAL PHALANX Right 10/12/2022   Procedure: Right ring finger middle phalanx and proximal interphalangeal joint closed reduction internal percutaneous pinning;  Surgeon: Alyse Agent, MD;  Location: [facility];  Service: Orthopedics;  Laterality: Right;    ROS: Review of Systems Negative except as stated above  PHYSICAL EXAM: BP 133/89   Pulse 97   Temp 98.3 F (36.8 C) (Oral)   Resp 16   Ht 6' 3 (1.905 m)   Wt 206 lb 3.2 oz (93.5 kg)   SpO2 95%   BMI 25.77 kg/m   Physical Exam HENT:     Head: Normocephalic and atraumatic.     Right Ear: Tympanic membrane, ear canal and external ear normal.  Left Ear: Tympanic membrane, ear canal and external ear normal.     Nose: Nose normal.     Mouth/Throat:     Mouth: Mucous membranes are moist.     Pharynx: Oropharynx is clear.   Eyes:     Extraocular Movements: Extraocular movements intact.     Conjunctiva/sclera: Conjunctivae normal.     Pupils: Pupils are equal, round, and reactive to light.   Neck:     Thyroid: No thyroid mass, thyromegaly or thyroid tenderness.   Cardiovascular:     Rate and Rhythm: Normal rate and regular rhythm.     Pulses: Normal pulses.     Heart sounds: Normal heart sounds.  Pulmonary:     Effort: Pulmonary effort is normal.     Breath sounds: Normal breath sounds.  Abdominal:     General: Bowel sounds are normal.     Palpations: Abdomen is soft.  Genitourinary:    Comments: Patient declined.  Musculoskeletal:        General: Normal range of motion.     Right shoulder: Normal.     Left shoulder: Normal.     Right upper arm: Normal.     Left upper arm: Normal.     Right elbow: Normal.     Left elbow: Normal.     Right forearm: Normal.     Left forearm: Normal.     Right wrist: Normal.     Left wrist: Normal.     Right hand: Normal.     Left hand: Normal.     Cervical back: Normal, normal range of motion and neck supple.     Thoracic back: Normal.     Lumbar back:  Normal.     Right hip: Normal.     Left hip: Normal.     Right upper leg: Normal.     Left upper leg: Normal.     Right knee: Normal.     Left knee: Normal.     Right lower leg: Normal.     Left lower leg: Normal.     Right ankle: Normal.     Left ankle: Normal.     Right foot: Normal.     Left foot: Normal.   Skin:    General: Skin is warm and dry.     Capillary Refill: Capillary refill takes less than 2 seconds.   Neurological:     General: No focal deficit present.     Mental Status: He is alert and oriented to person, place, and time.   Psychiatric:        Mood and Affect: Mood normal.        Behavior: Behavior normal.    ASSESSMENT AND PLAN: 1. Annual physical exam (Primary) - Counseled on 150 minutes of exercise per week as tolerated, healthy eating (including decreased daily intake of saturated fats, cholesterol, added sugars, sodium), STI prevention, and routine healthcare maintenance.  2. Screening for metabolic disorder - Routine screening.  - CMP14+EGFR  3. Screening for deficiency anemia - Routine screening.  - CBC  4. Diabetes mellitus screening - Routine screening.  - Hemoglobin A1c  5. Screening cholesterol level - Routine screening.  - Lipid panel  6. Thyroid disorder screen - Routine screening.  - TSH  7. Need for hepatitis C screening test - Routine screening.  - Hepatitis C Antibody  8. Vitamin D  deficiency - Continue Ergocalciferol  as prescribed. Counseled on medication adherence/adverse effects.  - Follow-up with primary provider in 12 weeks or  sooner if needed.  - Vitamin D , Ergocalciferol , (DRISDOL ) 1.25 MG (50000 UNIT) CAPS capsule; Take 1 capsule (50,000 Units total) by mouth every 7 (seven) days for 12 doses.  Dispense: 12 capsule; Refill: 0  9. Perennial allergic rhinitis - Continue Loratadine  as prescribed. Counseled on medication adherence/adverse effects.  - Follow-up with primary provider as scheduled. - loratadine   (CLARITIN ) 10 MG tablet; Take 1 tablet (10 mg total) by mouth daily.  Dispense: 90 tablet; Refill: 0   Patient was given the opportunity to ask questions.  Patient verbalized understanding of the plan and was able to repeat key elements of the plan. Patient was given clear instructions to go to Emergency Department or return to medical center if symptoms don't improve, worsen, or new problems develop.The patient verbalized understanding.   Orders Placed This Encounter  Procedures   Hepatitis C Antibody   CBC   Lipid panel   CMP14+EGFR   Hemoglobin A1c   TSH     Requested Prescriptions   Signed Prescriptions Disp Refills   Vitamin D , Ergocalciferol , (DRISDOL ) 1.25 MG (50000 UNIT) CAPS capsule 12 capsule 0    Sig: Take 1 capsule (50,000 Units total) by mouth every 7 (seven) days for 12 doses.   loratadine  (CLARITIN ) 10 MG tablet 90 tablet 0    Sig: Take 1 tablet (10 mg total) by mouth daily.    Return in about 1 year (around 12/22/2024) for Physical per patient preference.  Greig JINNY Drones, NP

## 2023-12-24 LAB — CMP14+EGFR
ALT: 53 IU/L — ABNORMAL HIGH (ref 0–44)
AST: 29 IU/L (ref 0–40)
Albumin: 4.8 g/dL (ref 4.3–5.2)
Alkaline Phosphatase: 73 IU/L (ref 44–121)
BUN/Creatinine Ratio: 12 (ref 9–20)
BUN: 10 mg/dL (ref 6–20)
Bilirubin Total: 0.3 mg/dL (ref 0.0–1.2)
CO2: 22 mmol/L (ref 20–29)
Calcium: 9.9 mg/dL (ref 8.7–10.2)
Chloride: 96 mmol/L (ref 96–106)
Creatinine, Ser: 0.82 mg/dL (ref 0.76–1.27)
Globulin, Total: 2.6 g/dL (ref 1.5–4.5)
Glucose: 77 mg/dL (ref 70–99)
Potassium: 4.2 mmol/L (ref 3.5–5.2)
Sodium: 138 mmol/L (ref 134–144)
Total Protein: 7.4 g/dL (ref 6.0–8.5)
eGFR: 121 mL/min/{1.73_m2} (ref >59)

## 2023-12-24 LAB — CBC
Hematocrit: 49.4 % (ref 37.5–51.0)
Hemoglobin: 16.9 g/dL (ref 13.0–17.7)
MCH: 30 pg (ref 26.6–33.0)
MCHC: 34.2 g/dL (ref 31.5–35.7)
MCV: 88 fL (ref 79–97)
Platelets: 323 10*3/uL (ref 150–450)
RBC: 5.64 x10E6/uL (ref 4.14–5.80)
RDW: 13.2 % (ref 11.6–15.4)
WBC: 6.2 10*3/uL (ref 3.4–10.8)

## 2023-12-24 LAB — LIPID PANEL
Chol/HDL Ratio: 7.7 ratio — ABNORMAL HIGH (ref 0.0–5.0)
Cholesterol, Total: 271 mg/dL — ABNORMAL HIGH (ref 100–199)
HDL: 35 mg/dL — ABNORMAL LOW (ref >39)
LDL Chol Calc (NIH): 114 mg/dL — ABNORMAL HIGH (ref 0–99)
Triglycerides: 683 mg/dL (ref 0–149)
VLDL Cholesterol Cal: 122 mg/dL — ABNORMAL HIGH (ref 5–40)

## 2023-12-24 LAB — HEMOGLOBIN A1C
Est. average glucose Bld gHb Est-mCnc: 97 mg/dL
Hgb A1c MFr Bld: 5 % (ref 4.8–5.6)

## 2023-12-24 LAB — TSH: TSH: 1.38 u[IU]/mL (ref 0.450–4.500)

## 2023-12-24 LAB — HEPATITIS C ANTIBODY: Hep C Virus Ab: NONREACTIVE

## 2023-12-26 ENCOUNTER — Ambulatory Visit: Payer: Self-pay | Admitting: Family

## 2023-12-26 ENCOUNTER — Telehealth: Payer: Self-pay | Admitting: Diagnostic Neuroimaging

## 2023-12-26 DIAGNOSIS — E785 Hyperlipidemia, unspecified: Secondary | ICD-10-CM

## 2023-12-26 DIAGNOSIS — Z13228 Encounter for screening for other metabolic disorders: Secondary | ICD-10-CM

## 2023-12-26 MED ORDER — ATORVASTATIN CALCIUM 20 MG PO TABS: 20.0000 mg | ORAL_TABLET | Freq: Every day | ORAL | 0 refills | Status: DC

## 2023-12-26 NOTE — Telephone Encounter (Signed)
 Called and spoke with patient to reschedule 01/03/24 appt due to MD being out. Patient has been rescheduled for 02/28/24 but is asking if there is anyway he can know the results of his EEG before appointment. He stated that the valium  medication has helped him a lot and he wanted to know if it needed to be increased and if he needed to continue the seizure medication. He's also wanting to know if he can drive. Best contact # is [phone] and he said you can also send a mychart msg as well

## 2023-12-26 NOTE — Telephone Encounter (Signed)
 EEG has not been resulted

## 2023-12-26 NOTE — Telephone Encounter (Signed)
 Spoke w/Pt regarding EEG, made him aware results have not been released to provider yet but once they are and provider has reviewed and given notes our office will reach out to him. Also advised Pt to continue his medications as prescribed by Dr. Emeline and if he has further questions regarding those medications he should call Dr. Emeline. Pt asked about being able to drive. Reminded Pt, according to [place] law he is to be seizure free for 6 months before driving again. Pt voiced understanding and thanks for the call.

## 2023-12-27 NOTE — Procedures (Signed)
   [facility]  EEG (ELECTROENCEPHALOGRAM) REPORT   STUDY DATE: 12/20/23 PATIENT NAME: Erik Santana. DOB: 27-Feb-1991 MRN: [id number]  ORDERING CLINICIAN: Eduard Hanlon, MD   TECHNOLOGIST: MARLA Plummer TECHNIQUE: Electroencephalogram was recorded utilizing standard 10-20 system of lead placement and reformatted into average and bipolar montages.  RECORDING TIME: 24 minutes ACTIVATION: hyperventilation and photic stimulation  CLINICAL INFORMATION: 34 year old male with intracerebral hemorrhage and seizure activity.  FINDINGS: Posterior dominant background rhythms, which attenuate with eye opening, ranging 13-14 hertz and 20-25 microvolts. No focal, lateralizing, epileptiform activity or seizures are seen. Patient recorded in the awake and drowsy state. EKG channel shows regular rhythm of 85-90 beats per minute.   IMPRESSION:   Normal EEG in the awake and drowsy states.    INTERPRETING PHYSICIAN:  EDUARD FABIENE HANLON, MD Certified in Neurology, Neurophysiology and Neuroimaging  [facility] [address] [place], [place] [address] [phone]

## 2023-12-28 ENCOUNTER — Ambulatory Visit: Payer: Self-pay | Admitting: Neurology

## 2023-12-29 ENCOUNTER — Other Ambulatory Visit: Payer: Self-pay

## 2023-12-29 ENCOUNTER — Telehealth: Payer: Self-pay | Admitting: *Deleted

## 2023-12-29 ENCOUNTER — Telehealth: Payer: Self-pay

## 2023-12-29 NOTE — Telephone Encounter (Signed)
 Erik Santana called and reports that the valium  we prescribed is helping him a lot. He has not had a full blown episode since starting the medication. He is supposed to take 2 x day but sometimes only takes one if he fells ok but occasionally he has taken 3 if he feels like an episode may be coming on. His problem is he doesn't see a bahavioral health specialist until the end of July and may not have enough medication to get him through til then. He is prescribed #60. I have explaine Dr Emeline out of office until Monday. He says he will be ok  until then and has about #20 pills left.

## 2023-12-29 NOTE — Patient Outreach (Signed)
 Complex Care Management   Visit Note  12/21/2023  Name:  Erik Santana. MRN: [id number] DOB: 1990/11/17  Situation: Referral received for Complex Care Management related to Mental/Behavioral Health diagnosis MDD and GAD I obtained verbal consent from Patient.  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    Assessment: Patient Reported Symptoms:  Cognitive Cognitive Status: Alert and oriented to person, place, and time, Normal speech and language skills Cognitive/Intellectual Conditions Management [RPT]: Brain Injury   Health Maintenance Behaviors: Annual physical exam  Neurological Neurological Review of Symptoms: No symptoms reported    HEENT HEENT Symptoms Reported: No symptoms reported      Cardiovascular Cardiovascular Symptoms Reported: No symptoms reported    Respiratory Respiratory Symptoms Reported: No symptoms reported    Endocrine Endocrine Symptoms Reported: No symptoms reported    Gastrointestinal Gastrointestinal Symptoms Reported: No symptoms reported      Genitourinary Genitourinary Symptoms Reported: No symptoms reported    Integumentary Integumentary Symptoms Reported: No symptoms reported    Musculoskeletal Musculoskelatal Symptoms Reviewed: No symptoms reported   Falls in the past year?: No Number of falls in past year: 1 or less Was there an injury with Fall?: No Fall Risk Category Calculator: 0 Patient Fall Risk Level: Low Fall Risk    Psychosocial Psychosocial Symptoms Reported: Anxiety - if selected complete GAD, Depression - if selected complete PHQ 2-9, Report of significant loss, deaths, abandonment, traumatic incidents Additional Psychological Details: Pt has good insight on triggers and how his medical conditions have negatively impacted his mental health  symptoms. LCSW discussed strategies to assist with emotional regulation and pt practices gratitude thinking to promote positive mood. PHQ9 and GAD7 scores have decreased since last PCP visit Behavioral Management Strategies: Adequate rest, Coping strategies, Counseling, Support system, Medication therapy Major Change/Loss/Stressor/Fears (CP): Medical condition, self, Traumatic event Techniques to Cope with Loss/Stress/Change: Diversional activities, Medication Quality of Family Relationships: involved, supportive, helpful      12/23/2023    1:45 PM  Depression screen PHQ 2/9  Decreased Interest 0  Down, Depressed, Hopeless 1  PHQ - 2 Score 1  Altered sleeping 1  Tired, decreased energy 1  Change in appetite 1  Feeling bad or failure about yourself  1  Trouble concentrating 1  Moving slowly or fidgety/restless 1  Suicidal thoughts 0  PHQ-9 Score 7  Difficult doing work/chores Somewhat difficult    There were no vitals filed for this visit.  Medications Reviewed Today     Reviewed by Ezzard Rolin BIRCH, LCSW (Social Worker) on 12/21/23 at 1124  Med List Status: <None>   Medication Order Taking? Sig Documenting Provider Last Dose Status Informant  acetaminophen  (TYLENOL ) 325 MG tablet [id number]  Take 1-2 tablets (325-650 mg total) by mouth every 6 (six) hours as needed for mild pain (pain score 1-3), headache or moderate pain (pain score 4-6) (325 mg for pain 1-3, 6500 mg for pain 4-6).  Patient not taking: Reported on 12/21/2023   Pegge Toribio PARAS, PA-C  Active Self, Pharmacy Records  busPIRone  (BUSPAR ) 10 MG tablet [id number] Yes Take 1.5 tablets (15 mg total) by mouth 3 (three) times daily. Emeline Joesph BROCKS, DO  Active Self, Pharmacy Records  diazepam  (VALIUM ) 2 MG tablet [id number] Yes Take 1 tablet (2 mg total) by mouth  2 (two) times daily as needed for anxiety or muscle spasms (use for anxiety/seizure-like episodes). Emeline Joesph BROCKS, DO  Active   hydrOXYzine  (ATARAX ) 25 MG tablet  [id number]  Take 1 tablet (25 mg total) by mouth 3 (three) times daily as needed for anxiety.  Patient not taking: Reported on 12/21/2023   Emeline Joesph BROCKS, DO  Active Self, Pharmacy Records  levETIRAcetam  (KEPPRA ) 250 MG tablet [id number] Yes Take one 500 mg tablet and one 250 mg tablet together for a total of 750 mg, twice a day Emeline Joesph BROCKS, DO  Active   lidocaine  (LIDODERM ) 5 % [id number]  Place 1 patch onto the skin every 12 (twelve) hours as needed. Remove & Discard patch within 12 hours or as directed by MD  Patient not taking: Reported on 12/21/2023   Emeline Joesph BROCKS, DO  Active Self, Pharmacy Records  loratadine  (CLARITIN ) 10 MG tablet [id number]  Take 1 tablet (10 mg total) by mouth daily.  Patient not taking: Reported on 12/21/2023   Pegge Toribio PARAS, PA-C  Active Self, Pharmacy Records  meclizine  (ANTIVERT ) 25 MG tablet [id number] Yes Take 1 tablet (25 mg total) by mouth 3 (three) times daily as needed for dizziness. Emeline Joesph BROCKS, DO  Active Self, Pharmacy Records  prazosin  (MINIPRESS ) 1 MG capsule [id number] Yes Take 1 capsule (1 mg total) by mouth at bedtime. Emeline Joesph BROCKS, DO  Active   QUEtiapine  (SEROQUEL ) 25 MG tablet [id number] Yes Take 1 tablet (25 mg total) by mouth 2 (two) times daily. Emeline Joesph BROCKS, DO  Active   tiZANidine  (ZANAFLEX ) 4 MG tablet [id number] Yes Take 1 tablet (4 mg total) by mouth 3 (three) times daily. Emeline Joesph BROCKS, DO  Active             Recommendation:   Continue Current Plan of Care  Follow Up Plan:   Telephone follow-up 2-4 weeks  [name], LCSW [facility]  [facility] Clinical Social Worker Direct Dial: [phone]  Fax: [phone] Website: [email or web address] 8:05 AM

## 2023-12-29 NOTE — Patient Outreach (Signed)
 Complex Care Management   Visit Note  12/29/2023  Name:  Erik Santana. MRN: [id number] DOB: 1990/09/14  Situation: Referral received for Complex Care Management related to SDOH Barriers:  Food insecurity Financial Resource Strain I obtained verbal consent from Patient.  Visit completed with patient  on the phone  Background:   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    Assessment: BSW conducted follow-up call with patient. Patient was alert and cognitive. Patient states he was able to get in contact with [name] Minus [phone] [phone]) with [facility]. Patient reports that [name] will be calling him sometime next week to begin gathering information to apply for SSDI since she is on vacation. Patient states he is experience financial resource strain from not being able to work and sometimes becomes frustrated about not having money. Patient reported he has some credit card debt as well as some hospital bills through [facility] that might be sent to collection soon. BSW and patient agreed to go over financial assistance through [facility] at our next follow-up appointment. Patient states he called his insurance provider regarding home delivered meals benefit. Patient reports he was given two different answers and not sure if he qualifies for that. BSW will call provider and attempt to get more information on this benefit and report back to patient. Patient agreed. Patient states he is low on food at the moment, but will be receiving his EBT benefit on the 9th.   SDOH Interventions    Flowsheet Row Patient Outreach from 12/22/2023 in [place] [facility] DEPARTMENT Patient Outreach Telephone from 11/29/2023 in [place] POPULATION HEALTH DEPARTMENT Office Visit from 11/09/2023 in [facility] at [facility]  SDOH Interventions      Food Insecurity Interventions Community Resources Provided [employer] Provided Intervention Not Indicated, AMB Referral  Housing Interventions Intervention Not Indicated Intervention Not Indicated Intervention Not Indicated  Transportation Interventions Intervention Not Indicated Intervention Not Indicated Intervention Not Indicated  Utilities Interventions Community Resources Provided  [employer] - additional benefits - house/utility allowance.] Intervention Not Indicated Intervention Not Indicated  Alcohol Usage Interventions -- -- Intervention Not Indicated (Score <7)  Financial Strain Interventions -- [employer] Provided Intervention Not Indicated  Physical Activity Interventions -- -- Intervention Not Indicated  Stress Interventions -- -- Intervention Not Indicated  Social Connections Interventions -- -- Intervention Not Indicated  Health Literacy Interventions -- -- Intervention Not Indicated      Recommendation:   None  Follow Up Plan:   Telephone follow up appointment date/time:  01/12/2024 at 2:15pm   [name], BSW [place]/VBCI - [employer] Social Worker [phone]

## 2023-12-29 NOTE — Patient Instructions (Signed)
 Visit Information  Mr. Michalik was given information about Medicaid Managed Care team care coordination services as a part of their [facility] Medicaid benefit. Erik Santana. verbally consented to engagement with the [facility] Managed Care team.   If you are experiencing a medical emergency, please call 911 or report to your local emergency department or urgent care.   If you have a non-emergency medical problem during routine business hours, please contact your provider's office and ask to speak with a nurse.   For questions related to your [facility] health plan, please call: [phone] or go here:[email or web address]  If you would like to schedule transportation through your [facility] plan, please call the following number at least 2 days in advance of your appointment: [phone].   You can also use the MTM portal or MTM mobile app to manage your rides. Reimbursement for transportation is available through [facility]! For the portal, please go to [email or web address]  Call the [facility] Crisis Line at [phone], at any time, 24 hours a day, 7 days a week. If you are in danger or need immediate medical attention call 911.  If you would like help to quit smoking, call 1-800-QUIT-NOW ((956)586-6802) OR Espaol: 1-855-Djelo-Ya (8-144-664-6430) o para ms informacin haga clic aqu or Text READY to 799-599 to register via text  [name] - following are the goals we discussed in your visit today:   Goals Addressed             This Visit's Progress    LCSW VBCI Social Work Care Plan   On track    Problems:   Disease Management support and education needs related to Anxiety with Panic Symptoms, and PTSD  CSW Clinical Goal(s):   Over the next 90 days the Patient will attend all scheduled medical appointments as evidenced by patient report and care team review of appointment completion in electronic MEDICAL RECORD NUMBER  demonstrate a reduction in symptoms  related to Anxiety with Panic Symptoms, PTSD .  Interventions:  Mental Health:  Evaluation of current treatment plan related to Anxiety with Panic Symptoms, and PTSD Active listening / Reflection utilized Financial risk analyst / information provided Emotional Support Provided Mindfulness or Relaxation training provided Provided general psycho-education for mental health needs Quality of sleep assessed & Sleep Hygiene techniques promoted Reviewed mental health medications and discussed importance of compliance: Pt agreed to compliance Solution-Focued Strategies employed: Suicidal Ideation/Homicidal Ideation assessed: Pt denies  Patient Goals/Self-Care Activities:  Continue taking your medication as prescribed.   Increase coping skills and healthy habits  Plan:   Telephone follow up appointment with care management team member scheduled for:  2-4 weeks        Please see education materials related to topics discussed provided by MyChart link.  Patient verbalizes understanding of instructions and care plan provided today and agrees to view in MyChart. Active MyChart status and patient understanding of how to access instructions and care plan via MyChart confirmed with patient.     Licensed Clinical Social Worker will f/up with you on 7/16 at 1 PM  Erik Mcgregor, LCSW [place]  [facility], [facility] Clinical Social Worker Direct Dial: [phone]  Fax: [phone] Website: [email or web address] 8:06 AM   Following is a copy of your plan of care:  There are no care plans that you recently modified to display for this patient.

## 2023-12-29 NOTE — Patient Instructions (Signed)
 Visit Information  Mr. Maciolek was given information about Medicaid Managed Care team care coordination services as a part of their [facility] Medicaid benefit. [name]. verbally consented to engagement with the [facility] Managed Care team.   If you are experiencing a medical emergency, please call 911 or report to your local emergency department or urgent care.   If you have a non-emergency medical problem during routine business hours, please contact your provider's office and ask to speak with a nurse.   For questions related to your [facility] health plan, please call: [phone] or go here:[email or web address]  If you would like to schedule transportation through your [facility] plan, please call the following number at least 2 days in advance of your appointment: [phone].   You can also use the MTM portal or MTM mobile app to manage your rides. Reimbursement for transportation is available through [facility]! For the portal, please go to [email or web address]  Call the [facility] Crisis Line at [phone], at any time, 24 hours a day, 7 days a week. If you are in danger or need immediate medical attention call 911.  If you would like help to quit smoking, call 1-800-QUIT-NOW (289-612-6115) OR Espaol: 1-855-Djelo-Ya (8-144-664-6430) o para ms informacin haga clic aqu or Text READY to 799-599 to register via text  Mr. Blancett - following are the goals we discussed in your visit today:   Goals Addressed   None      Patient verbalizes understanding of instructions and care plan provided today and agrees to view in MyChart. Active MyChart status and patient understanding of how to access instructions and care plan via MyChart confirmed with patient.     Telephone follow up appointment with Managed Medicaid care management team member scheduled for: 01/12/2024 at 2:15pm  [name], [place]/VBCI - [facility] Social  Worker [phone]   Following is a copy of your plan of care:  There are no care plans that you recently modified to display for this patient.

## 2023-12-30 ENCOUNTER — Other Ambulatory Visit: Payer: Self-pay | Admitting: Physical Medicine and Rehabilitation

## 2024-01-03 ENCOUNTER — Telehealth: Admitting: Diagnostic Neuroimaging

## 2024-01-04 ENCOUNTER — Ambulatory Visit (INDEPENDENT_AMBULATORY_CARE_PROVIDER_SITE_OTHER): Admitting: Physician Assistant

## 2024-01-04 VITALS — BP 125/82 | HR 71 | Temp 97.9°F | Ht 75.0 in | Wt 209.8 lb

## 2024-01-04 DIAGNOSIS — F4323 Adjustment disorder with mixed anxiety and depressed mood: Secondary | ICD-10-CM | POA: Diagnosis not present

## 2024-01-04 DIAGNOSIS — F411 Generalized anxiety disorder: Secondary | ICD-10-CM | POA: Diagnosis not present

## 2024-01-04 DIAGNOSIS — F4310 Post-traumatic stress disorder, unspecified: Secondary | ICD-10-CM | POA: Diagnosis not present
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
## 2024-01-05 ENCOUNTER — Other Ambulatory Visit: Payer: Self-pay

## 2024-01-05 ENCOUNTER — Telehealth: Admitting: Diagnostic Neuroimaging

## 2024-01-05 ENCOUNTER — Encounter: Payer: Self-pay | Admitting: Diagnostic Neuroimaging

## 2024-01-05 DIAGNOSIS — G40109 Localization-related (focal) (partial) symptomatic epilepsy and epileptic syndromes with simple partial seizures, not intractable, without status epilepticus: Secondary | ICD-10-CM | POA: Diagnosis not present

## 2024-01-05 DIAGNOSIS — I610 Nontraumatic intracerebral hemorrhage in hemisphere, subcortical: Secondary | ICD-10-CM

## 2024-01-05 MED ORDER — LEVETIRACETAM 750 MG PO TABS: 750.0000 mg | ORAL_TABLET | Freq: Two times a day (BID) | ORAL | 4 refills | Status: DC

## 2024-01-05 NOTE — Patient Instructions (Signed)
 Visit Information  Mr. Guastella was given information about Medicaid Managed Care team care coordination services as a part of their [facility] Medicaid benefit. [name]. verbally consented to engagement with the [facility] Managed Care team.   If you are experiencing a medical emergency, please call 911 or report to your local emergency department or urgent care.   If you have a non-emergency medical problem during routine business hours, please contact your provider's office and ask to speak with a nurse.   For questions related to your [facility] health plan, please call: [phone] or go here:[email or web address]  If you would like to schedule transportation through your [facility] plan, please call the following number at least 2 days in advance of your appointment: [phone].   You can also use the MTM portal or MTM mobile app to manage your rides. Reimbursement for transportation is available through [facility]! For the portal, please go to [email or web address]  Call the [facility] Crisis Line at [phone], at any time, 24 hours a day, 7 days a week. If you are in danger or need immediate medical attention call 911.  If you would like help to quit smoking, call 1-800-QUIT-NOW (340-111-0607) OR Espaol: 1-855-Djelo-Ya (8-144-664-6430) o para ms informacin haga clic aqu or Text READY to 799-599 to register via text  Mr. Moxley - following are the goals we discussed in your visit today:   Goals Addressed   None     Patient verbalizes understanding of instructions and care plan provided today and agrees to view in MyChart. Active MyChart status and patient understanding of how to access instructions and care plan via MyChart confirmed with patient.     Telephone follow up appointment with Managed Medicaid care management team member scheduled for: 01/13/2024 at 11:15am  [name], BSW [place]/VBCI - [facility] Social  Worker [phone]   Following is a copy of your plan of care:  There are no care plans that you recently modified to display for this patient.

## 2024-01-05 NOTE — Telephone Encounter (Signed)
 Noted. Also, please note Levetiracetam  was not initially prescribed by [facility] provider.

## 2024-01-05 NOTE — Telephone Encounter (Signed)
 noted

## 2024-01-05 NOTE — Progress Notes (Signed)
 [facility]  PATIENT: Erik Santana. DOB: [birth year]  REFERRING CLINICIAN: Lorren Greig PARAS, NP HISTORY FROM: patient  REASON FOR VISIT: follow up   HISTORICAL  CHIEF COMPLAINT:  Chief Complaint  Patient presents with   leg spasm / seizure    HISTORY OF PRESENT ILLNESS:   UPDATE (01/05/24, VRP): Since last visit, had ER visit on 11/20/23 for spasms of left leg (tonic, clonic). First event lasted 15 minutes. Had eval and ten went home. Had 5-6 more events. Some associated breathing diff, panic feeling, anxiety. Then saw Dr. Emeline, who started levetiracetam  500mg  twice a day, and now 750mg  twice a day. Now on diazepam  for anxiety / spasms as well.   PRIOR HPI (11/16/23, VRP): 33 year old male here for evaluation of intracerebral hemorrhage.  08/18/2023 patient was at home woke up feeling okay.  He went to use the bathroom when all of a sudden he felt headache, generalized weakness.  He tried to get up and fell down because of left-sided weakness.  Called for help and EMS was called to scene.  Patient was taken to the hospital and diagnosed with right frontoparietal subcortical intracerebral hemorrhage.  CT angiogram, CT venogram, MRI of the brain were obtained but no specific cause was found.  He followed up with diagnostic cerebral angiogram with no underlying vascular malformation was found.  He was transferred to inpatient rehab and now is back at home.  Symptoms are gradually improving over time.  About 1 week prior to symptoms patient was having a severe upper respiratory infection with flulike symptoms.  He did take some over-the-counter cold medications.  Patient had been intermittently using opiates for back pain and cannabis recreationally.   REVIEW OF SYSTEMS: Full 14 system review of systems performed and negative with exception of: as per HPI.  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 (Patient not taking: Reported on 12/21/2023)     atorvastatin  (LIPITOR) 20 MG tablet Take 1 tablet (20 mg total) by mouth daily. 90 tablet 0   busPIRone  (BUSPAR ) 10 MG tablet Take 1.5 tablets (15 mg total) by mouth 3 (three) times daily. 135 tablet 3   diazepam  (VALIUM ) 2 MG tablet Take 1 tablet (2 mg total) by mouth 2 (two) times daily as needed for anxiety or muscle spasms (use for anxiety/seizure-like episodes). 60 tablet 0   hydrOXYzine  (ATARAX ) 25 MG tablet Take 1 tablet (25 mg total) by mouth 3 (three) times daily as needed for anxiety. (Patient not taking: Reported on 12/21/2023) 30 tablet 3   lidocaine  (LIDODERM ) 5 % Place 1 patch onto the skin every 12 (twelve) hours as needed. Remove & Discard patch within 12 hours or as directed by MD (Patient not taking: Reported on 12/21/2023) 30 patch 3   loratadine  (CLARITIN ) 10 MG tablet Take 1 tablet (10 mg total) by mouth daily. 90 tablet 0   meclizine  (ANTIVERT ) 25 MG tablet Take 1 tablet (25 mg total) by mouth 3 (three) times daily as needed for dizziness. 30 tablet  3   prazosin  (MINIPRESS ) 1 MG capsule Take 1 capsule (1 mg total) by mouth at bedtime. 30 capsule 3   QUEtiapine  (SEROQUEL ) 25 MG tablet Take 1 tablet (25 mg total) by mouth 2 (two) times daily. 60 tablet 0   tiZANidine  (ZANAFLEX ) 4 MG tablet Take 1 tablet (4 mg total) by mouth 3 (three) times daily. 90 tablet 3   Vitamin D , Ergocalciferol , (DRISDOL ) 1.25 MG (50000 UNIT) CAPS capsule Take 1 capsule (50,000 Units total) by mouth every 7 (seven) days for 12 doses. 12 capsule 0   levETIRAcetam  (KEPPRA ) 250 MG  tablet Take one 500 mg tablet and one 250 mg tablet together for a total of 750 mg, twice a day 60 tablet 0   No facility-administered medications prior to visit.    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    PAST SURGICAL HISTORY: Past Surgical History:  Procedure Laterality Date   ESOPHAGOGASTRODUODENOSCOPY N/A 10/18/2012   Procedure: ESOPHAGOGASTRODUODENOSCOPY (EGD);  Surgeon: Lamar JONETTA Aho, MD;  Location: [facility];  Service: Endoscopy;  Laterality: N/A;   ESOPHAGOGASTRODUODENOSCOPY (EGD) WITH PROPOFOL  N/A 10/18/2012   Procedure: ESOPHAGOGASTRODUODENOSCOPY (EGD) WITH PROPOFOL ;  Surgeon: Lamar JONETTA Aho, MD;  Location: [facility];  Service: Endoscopy;  Laterality: N/A;   FINGER SURGERY     IR ANGIO INTRA EXTRACRAN SEL COM CAROTID INNOMINATE BILAT MOD SED  08/23/2023   IR ANGIO VERTEBRAL SEL SUBCLAVIAN INNOMINATE UNI L MOD SED  08/23/2023   IR ANGIO VERTEBRAL SEL VERTEBRAL UNI R MOD SED  08/23/2023   OPEN REDUCTION INTERNAL FIXATION (ORIF) PROXIMAL PHALANX Right 10/12/2022   Procedure: Right ring finger middle phalanx and proximal interphalangeal joint closed reduction internal percutaneous pinning;  Surgeon: Alyse Agent, MD;  Location: [facility];  Service: Orthopedics;  Laterality: Right;    FAMILY HISTORY: Family History  Problem Relation Age of Onset   Healthy Mother    Healthy Father     SOCIAL HISTORY: Social History   Socioeconomic History   Marital status: Single    Spouse name: Not on file   Number of children: Not on file   Years of education: Not on file   Highest education level: Not on file  Occupational History   Not on file  Tobacco Use   Smoking status: Former    Current packs/day: 0.50    Types: Cigarettes   Smokeless tobacco: Never  Vaping Use   Vaping status: Former   Substance and Sexual Activity   Alcohol use: Not Currently    Comment: occ   Drug use: No   Sexual activity: Not on file  Other Topics Concern   Not on file  Social History Narrative   Not on file   Social Drivers of Health   Financial Resource Strain: Medium Risk (11/29/2023)   Overall Financial Resource Strain (CARDIA)    Difficulty of Paying Living Expenses: Somewhat hard  Food Insecurity: Food Insecurity Present (12/22/2023)   Hunger Vital Sign    Worried About Running Out of Food in the Last Year: Sometimes true    Ran Out of Food in the Last Year: Sometimes true  Transportation Needs: Unmet Transportation Needs (12/22/2023)   PRAPARE - Transportation    Lack of Transportation (Medical): Yes    Lack of Transportation (Non-Medical): Yes  Physical Activity: Sufficiently Active (11/09/2023)  Exercise Vital Sign    Days of Exercise per Week: 5 days    Minutes of Exercise per Session: 30 min  Stress: No Stress Concern Present (11/09/2023)   [employer] of Occupational Health - Occupational Stress Questionnaire    Feeling of Stress : Only a little  Social Connections: Socially Isolated (11/09/2023)   Social Connection and Isolation Panel    Frequency of Communication with Friends and Family: More than three times a week    Frequency of Social Gatherings with Friends and Family: More than three times a week    Attends Religious Services: Never    Database administrator or Organizations: No    Attends Banker Meetings: Never    Marital Status: Never married  Intimate Partner Violence: Not At Risk (12/22/2023)   Humiliation, Afraid, Rape, and Kick questionnaire    Fear of Current or Ex-Partner: No    Emotionally Abused: No    Physically Abused: No    Sexually Abused: No     PHYSICAL EXAM  GENERAL EXAM/CONSTITUTIONAL: Vitals:  There were no vitals filed for this visit.  There is no height or weight on file to calculate BMI. Wt Readings from Last 3  Encounters:  12/23/23 206 lb 3.2 oz (93.5 kg)  12/07/23 198 lb (89.8 kg)  11/20/23 196 lb (88.9 kg)   Patient is in no distress; well developed, nourished and groomed; neck is supple  CARDIOVASCULAR: Examination of carotid arteries is normal; no carotid bruits Regular rate and rhythm, no murmurs Examination of peripheral vascular system by observation and palpation is normal  EYES: Ophthalmoscopic exam of optic discs and posterior segments is normal; no papilledema or hemorrhages No results found.  MUSCULOSKELETAL: Gait, strength, tone, movements noted in Neurologic exam below  NEUROLOGIC: MENTAL STATUS:      No data to display         awake, alert, oriented to person, place and time recent and remote memory intact normal attention and concentration language fluent, comprehension intact, naming intact fund of knowledge appropriate  CRANIAL NERVE:  2nd - no papilledema on fundoscopic exam 2nd, 3rd, 4th, 6th - pupils equal and reactive to light, visual fields full to confrontation, extraocular muscles intact, no nystagmus 5th - facial sensation symmetric 7th - facial strength symmetric 8th - hearing intact 9th - palate elevates symmetrically, uvula midline 11th - shoulder shrug symmetric 12th - tongue protrusion midline  MOTOR:  normal bulk and tone, full strength in the BUE, BLE  SENSORY:  normal and symmetric to light touch, pinprick, temperature, vibration  COORDINATION:  finger-nose-finger, fine finger movements normal  REFLEXES:  deep tendon reflexes present and symmetric  GAIT/STATION:  narrow based gait    DIAGNOSTIC DATA (LABS, IMAGING, TESTING) - I reviewed patient records, labs, notes, testing and imaging myself where available.  Lab Results  Component Value Date   WBC 6.2 12/23/2023   HGB 16.9 12/23/2023   HCT 49.4 12/23/2023   MCV 88 12/23/2023   PLT 323 12/23/2023      Component Value Date/Time   NA 138 12/23/2023 1428   K 4.2  12/23/2023 1428   CL 96 12/23/2023 1428   CO2 22 12/23/2023 1428   GLUCOSE 77 12/23/2023 1428   GLUCOSE 124 (H) 11/20/2023 0147   BUN 10 12/23/2023 1428   CREATININE 0.82 12/23/2023 1428   CALCIUM  9.9 12/23/2023 1428   PROT 7.4 12/23/2023 1428   ALBUMIN 4.8 12/23/2023 1428   AST 29 12/23/2023 1428  ALT 53 (H) 12/23/2023 1428   ALKPHOS 73 12/23/2023 1428   BILITOT 0.3 12/23/2023 1428   GFRNONAA >60 11/20/2023 0133   GFRAA >90 10/18/2012 1120   Lab Results  Component Value Date   CHOL 271 (H) 12/23/2023   HDL 35 (L) 12/23/2023   LDLCALC 114 (H) 12/23/2023   TRIG 683 (HH) 12/23/2023   CHOLHDL 7.7 (H) 12/23/2023   Lab Results  Component Value Date   HGBA1C 5.0 12/23/2023   Lab Results  Component Value Date   VITAMINB12 567 11/16/2023   Lab Results  Component Value Date   TSH 1.380 12/23/2023     08/21/2023 CT head [I reviewed images myself and agree with interpretation. -VRP]  - 3.9 x 2.4 x 4.1 cm parenchymal hemorrhage centered at the right frontoparietal junction. No evidence of intraventricular extension. No mass effect.  08/21/23 CTA head  1. No intracranial large vessel occlusion or significant stenosis. 2. No hemodynamically significant stenosis in the neck. 3. Poor visualization of the vein of [place] on the right. see separately dictated CT venogram for additional findings  08/21/2023 CT venogram 1. No evidence of dural venous sinus thrombosis. 2. Increased vascularity along the upper margin of the hemorrhage in the right frontoparietal region, which may be reactive in the increased venous drainage. An additional differential consideration is a subtle dural AV fistula. Recommend further evaluation with a brain MRI with and without contrast.  08/22/2023 MRI brain with and without [I reviewed images myself and agree with interpretation. -VRP]  - Unchanged appearance of intraparenchymal hematoma within the superior right frontal lobe. No mass  lesion.  08/23/23 cerebral angiogram - Angiographically no gross evidence of an arteriovenous malformation, aneurysm, dissection, or dural AV fistula noted. - Mass effect on the right mid parasagittal frontal cortical vein probably hematoma probably secondary to the underlying hematoma.   12/09/23 MRI brain with and without contrast demonstrating: - Improving right frontal intracerebral hemorrhage, now chronic with some late subacute blood products noted.  12/20/23  - Normal EEG in the awake and drowsy states.      ASSESSMENT AND PLAN  34 y.o. year old male here with:   Dx:  1. Partial symptomatic epilepsy with simple partial seizures, not intractable, without status epilepticus (HCC)   2. Nontraumatic subcortical hemorrhage of right cerebral hemisphere [facility])      PLAN:  LEFT LEG SPASMS (possible partial seizures from ICH; first event 11/20/23, last event 12/09/23) - continue levetiracetam  750mg  twice a day long term; unclear if events from 11/20/23 were seizures or spasticity from ICH (left leg spasms, panic sensation), but he is at increased for seizures based on the intracerebral hemorrhage.    - According to [place] law, you can not drive unless you are seizure / syncope free for at least 6 months and under physician's care (may shorten to 3 months as patient does not lose consciousness and has ~15-30 minute warning before event occurs)   - Please maintain precautions. Do not participate in activities where a loss of awareness could harm you or someone else. No swimming alone, no tub bathing, no hot tubs, no driving, no operating motorized vehicles (cars, ATVs, motocycles, etc), lawnmowers, power tools or firearms. No standing at heights, such as rooftops, ladders or stairs. Avoid hot objects such as stoves, heaters, open fires. Wear a helmet when riding a bicycle, scooter, skateboard, etc. and avoid areas of traffic. Set your water heater to 120 degrees or less.   ANXIETY / PTSD -  follow  up with behavioral health (on buspar , seroquel , diazepam )   SPONTANEOUS RIGHT FRONTAL INTRACEREBRAL HEMORRHAGE (Feb 2025; prodromal upper respiratory infection; possible vasoconstriction / hemorrrhage event in setting of cold medication and mild opiate use; otherwise unknown cause) - improving on MRI; repeat scan in 6 months - continue PT/OT  Meds ordered this encounter  Medications   levETIRAcetam  (KEPPRA ) 750 MG tablet    Sig: Take 1 tablet (750 mg total) by mouth 2 (two) times daily. Take one 500 mg tablet and one 250 mg tablet together for a total of 750 mg, twice a day    Dispense:  180 tablet    Refill:  4   Return in about 6 months (around 07/07/2024) for MyChart visit (15 min).  Virtual Visit via Video Note  I connected with Erik Santana. on 01/05/2024 at  3:15 PM EDT by a video enabled telemedicine application and verified that I am speaking with the correct person using two identifiers.   I discussed the limitations of evaluation and management by telemedicine and the availability of in person appointments. The patient expressed understanding and agreed to proceed.  Patient is at home and I am at the office.   I spent 25 minutes of face-to-face and non-face-to-face time with patient.  This included previsit chart review, lab review, study review, order entry, electronic health record documentation, patient education.      EDUARD FABIENE HANLON, MD 01/05/2024, 3:57 PM Certified in Neurology, Neurophysiology and Neuroimaging  [facility] [address] [place], [place] [address] [phone]

## 2024-01-05 NOTE — Patient Outreach (Signed)
 Complex Care Management   Visit Note  01/05/2024  Name:  Erik Santana. MRN: [id number] DOB: [birth year]  Situation: Referral received for Complex Care Management related to SDOH Barriers:  Food insecurity Financial Resource Strain I obtained verbal consent from Patient.  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 Patient was alert and cognitive. Patient made BSW aware that he was suppose to receive his monthly FNS benefit on the 9th (yesterday) but so far has not received anything. Patient states he was suppose to send a letter from his PCP stating he cannot work due to his brain injuries. Patient states he submitted that letter, but is not sure why he has not received his benefit. Patient reports he is low on food. Patient and BSW attempted to call DSS [place] while on the phone but did not have success in establishing contact. Patient shared with BSW email contact information to [name] ([email or web address]) who is mentioned in one of his DSS letters regarding his case (case ID: [id number]). BSW will email her and FNS supervisor [place] Roman [email or web address]) regarding patients food stamps benefit and copy patient in email. Patient undestood and agreed.   Patient also stated that he heard back from [name] with the disability advocacy center this morning. Patient reports he was told that [name] would reach out to him to begin appointment but was not given a timeline as to when/where or what time. Patient agreed to try to call her again and get more information and keep BSW updated.   SDOH Interventions    Flowsheet Row Patient Outreach from 12/22/2023 in [place] POPULATION HEALTH DEPARTMENT Patient Outreach Telephone  from 11/29/2023 in [place] POPULATION HEALTH DEPARTMENT Office Visit from 11/09/2023 in [facility]  SDOH Interventions     Food Insecurity Interventions Community Resources Provided [employer] Provided Intervention Not Indicated, AMB Referral  Housing Interventions Intervention Not Indicated Intervention Not Indicated Intervention Not Indicated  Transportation Interventions Intervention Not Indicated Intervention Not Indicated Intervention Not Indicated  Utilities Interventions Community Resources Provided  [employer] - additional benefits - house/utility allowance.] Intervention Not Indicated Intervention Not Indicated  Alcohol Usage Interventions -- -- Intervention Not Indicated (Score <7)  Financial Strain Interventions -- [employer] Provided Intervention Not Indicated  Physical Activity Interventions -- -- Intervention Not Indicated  Stress Interventions -- -- Intervention Not Indicated  Social Connections Interventions -- -- Intervention Not Indicated  Health Literacy Interventions -- -- Intervention Not Indicated      Recommendation:   Visit DSS to inquire about food stamps benefit pause.   Follow Up Plan:   Telephone follow up appointment date/time:  01/13/2024 at 11:15am  [name], BSW [place]/[facility] - [facility] Social Worker [phone]

## 2024-01-05 NOTE — Patient Instructions (Addendum)
  LEFT LEG SPASMS (possible partial seizures from ICH; first event  episode 12/09/23) - Recommend to continue levetiracetam  750mg  twice a day long term; unclear if events from 11/20/23 were seizures or spasticity from ICH (left leg spasms, panic sensation), but he is at increased for seizures based on the intracerebral hemorrhage.    - According to [place] law, you can not drive unless you are seizure / syncope free for at least 6 months and under physician's care (may shorten to 3 months as patient does not lose consciousness and has 15-30 minute warning before event occurs)   - Please maintain precautions. Do not participate in activities where a loss of awareness could harm you or someone else. No swimming alone, no tub bathing, no hot tubs, no driving, no operating motorized vehicles (cars, ATVs, motocycles, etc), lawnmowers, power tools or firearms. No standing at heights, such as rooftops, ladders or stairs. Avoid hot objects such as stoves, heaters, open fires. Wear a helmet when riding a bicycle, scooter, skateboard, etc. and avoid areas of traffic. Set your water heater to 120 degrees or less.  ANXIETY / PTSD - follow up with behavioral health (on buspar , seroquel , diazepam )  SPONTANEOUS RIGHT FRONTAL INTRACEREBRAL HEMORRHAGE (Feb 2025; prodromal upper respiratory infection; possible vasoconstriction / hemorrrhage event in setting of cold medication and mild opiate use; otherwise unknown cause) - continue PT/OT

## 2024-01-05 NOTE — Telephone Encounter (Signed)
 Recommend to continue levetiracetam  750mg  twice a day long term; unclear if events from 11/20/23 were seizures or spasticity from ICH (left leg spasms, panic sensation), but he is at increased for seizures based on the intracerebral hemorrhage.   Would recommend to follow seizure safety precautions:  - According to [place] law, you can not drive unless you are seizure / syncope free for at least 6 months and under physician's care.   - Please maintain precautions. Do not participate in activities where a loss of awareness could harm you or someone else. No swimming alone, no tub bathing, no hot tubs, no driving, no operating motorized vehicles (cars, ATVs, motocycles, etc), lawnmowers, power tools or firearms. No standing at heights, such as rooftops, ladders or stairs. Avoid hot objects such as stoves, heaters, open fires. Wear a helmet when riding a bicycle, scooter, skateboard, etc. and avoid areas of traffic. Set your water heater to 120 degrees or less.   EDUARD FABIENE HANLON, MD 01/05/2024, 12:03 PM Certified in Neurology, Neurophysiology and Neuroimaging  [facility] [address], [address] [place], [place] [address] [phone]

## 2024-01-06 ENCOUNTER — Telehealth: Payer: Self-pay

## 2024-01-06 ENCOUNTER — Ambulatory Visit: Attending: Physician Assistant

## 2024-01-06 VITALS — BP 145/91

## 2024-01-06 DIAGNOSIS — R2689 Other abnormalities of gait and mobility: Secondary | ICD-10-CM | POA: Diagnosis present

## 2024-01-06 DIAGNOSIS — R278 Other lack of coordination: Secondary | ICD-10-CM | POA: Diagnosis present

## 2024-01-06 DIAGNOSIS — M6281 Muscle weakness (generalized): Secondary | ICD-10-CM | POA: Diagnosis present

## 2024-01-06 DIAGNOSIS — R2681 Unsteadiness on feet: Secondary | ICD-10-CM | POA: Diagnosis present

## 2024-01-06 NOTE — Therapy (Signed)
 OUTPATIENT PHYSICAL THERAPY NEURO TREATMENT   Patient Name: Erik Santana. MRN: [id number] DOB:04-Mar-1991, 33 y.o., male 59 Date: 01/06/2024   PCP: Greig Drones, NP REFERRING PROVIDER: Pegge Toribio PARAS, PA-C   END OF SESSION:  PT End of Session - 01/06/24 1438     Visit Number 14    Number of Visits 23    Date for PT Re-Evaluation 03/09/24    Authorization Type [place] Medicaid    PT Start Time 1445    PT Stop Time 1528    PT Time Calculation (min) 43 min    Equipment Utilized During Treatment Gait belt    Activity Tolerance Patient tolerated treatment well    Behavior During Therapy WFL for tasks assessed/performed          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   Past Surgical History:  Procedure Laterality Date   ESOPHAGOGASTRODUODENOSCOPY N/A 10/18/2012   Procedure: ESOPHAGOGASTRODUODENOSCOPY (EGD);  Surgeon: Lamar JONETTA Aho, MD;  Location: [facility];  Service: Endoscopy;  Laterality: N/A;   ESOPHAGOGASTRODUODENOSCOPY (EGD) WITH PROPOFOL  N/A 10/18/2012   Procedure: ESOPHAGOGASTRODUODENOSCOPY (EGD) WITH PROPOFOL ;  Surgeon: Lamar JONETTA Aho, MD;  Location: [facility];  Service: Endoscopy;  Laterality: N/A;   FINGER SURGERY     IR ANGIO INTRA EXTRACRAN SEL COM CAROTID INNOMINATE BILAT MOD SED  08/23/2023   IR ANGIO VERTEBRAL SEL SUBCLAVIAN INNOMINATE UNI L MOD SED  08/23/2023   IR ANGIO VERTEBRAL SEL VERTEBRAL UNI R MOD SED  08/23/2023   OPEN REDUCTION INTERNAL FIXATION (ORIF) PROXIMAL PHALANX Right 10/12/2022   Procedure: Right ring finger middle phalanx and proximal interphalangeal joint closed reduction internal percutaneous pinning;  Surgeon: Alyse Agent, MD;  Location: [facility];  Service: Orthopedics;  Laterality: Right;   Patient Active Problem List   Diagnosis Date Noted   Seizure-like activity (HCC)  12/16/2023   PTSD (post-traumatic stress disorder) 12/07/2023   Left hemiparesis (HCC) 12/07/2023   Spasticity 12/07/2023   Emotional lability 10/05/2023   Left foot drop 10/05/2023   Cognitive change 08/30/2023   Intraparenchymal hematoma of brain (HCC) 08/24/2023   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   Anxiety state 10/27/2012   Hematemesis 10/18/2012   Other esophagitis 10/18/2012   Mallory-Weiss tear 10/18/2012    ONSET DATE: 09/22/2023 (referral date)  REFERRING DIAG: S06.33AA (ICD-10-CM) - Intraparenchymal hematoma of brain due to trauma [facility])  THERAPY DIAG:  Other abnormalities of gait and mobility  Muscle weakness (generalized)  Other lack of coordination  Unsteadiness on feet  Rationale for Evaluation and Treatment: Rehabilitation  SUBJECTIVE:  SUBJECTIVE STATEMENT: Patient arrives to clinic with mom. Has tried running, but his soul doesn't come with him and he can't continue. Valium  has been working well. Plans to stay on Keppra  for now. Denies falls. Saw behavioral health earlier in the week and they will f/u with patient re: potentially a new rx to manage episodes.  Pt accompanied by: self and mother, [name]  PERTINENT HISTORY: parenchymal hemorrhage centered at the right frontal parietal junction, intraparenchymal hematoma within the superior right frontal lobe, seizures   PAIN:  Are you having pain? 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 Yes. 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   OBJECTIVE:  Note: Objective measures were completed at Evaluation unless otherwise noted.  DIAGNOSTIC FINDINGS:   MR 08/22/2023: IMPRESSION: Unchanged appearance of intraparenchymal hematoma within the superior right frontal lobe. No mass lesion.  COGNITION: Overall cognitive status: Within functional limits for tasks assessed   VITALS:  Vitals:   01/06/24 1455  BP: (!) 145/91                                                                                                    TREATMENT:   Self-care/home management  Assessed vitals (see above) and diastolic BP elevated  Provided patient with info on [facility]- advising him to call and get on waitlist   NMR  -Treadmill warmup  -up to 2. B UE on, 2 mins off for 10 mins  -curtsey squats with 8lb 2x8   -noted greater instability with L LE posterior  -115' dribbling cross body   -noted decrease in L knee flexion and greater tone synergy with dual motor task -standing on airex cross body dribbling   -progressed to A/P rockerboard cross body dribbling  -progressed to + dual cog task with noted preference for motor task > cog and greater instances of LOB      PATIENT EDUCATION: Education details: Continue HEP, [facility] info Person educated: Patient and Parent Education method: Explanation and Handouts Education comprehension: verbalized understanding, returned demonstration, and needs further education  HOME EXERCISE PROGRAM: Access Code: [email or web address] URL: [email or web address] Date: 11/01/2023 Prepared by:  Lauraine Grumbling  Exercises - Staggered Sit-to-Stand  - 1 x daily - 7 x weekly - 3 sets - 10 reps - Backward Walking with Counter Support  - 1 x daily - 7 x weekly - 5 sets  Access Code: [email or web address] URL: [email or web address] Date: 11/04/2023 Prepared by: Marlon Plaster  Exercises - Calf stretch  - 1 x daily - 7 x weekly - 60-90 seconds hold - Squat with Resistance at Thighs  - 1 x daily - 7 x weekly - 3 sets - 10 reps - Side Stepping with Resistance at Thighs  - 1 x daily - 7 x weekly - 3 sets - 10 reps  GOALS: Goals reviewed with patient? Yes   LONG TERM GOALS: Target date: 12/16/2023  Patient will report demonstrate independence with final HEP in order to maintain current gains and continue to progress after physical therapy discharge.   Baseline: To be provided  Goal status: IN PROGRESS   2.  Patient will improve gait speed to 0.5 m/s or greater to indicate an improvement from being a household ambulator to limited community ambulator.   Baseline: 0.34 m/s with R l[place]strand crutch and L AFO (SBA); 0.7 m/s no AD or AFO  Goal status: MET  3.  Patient will improve their 5x Sit to Stand score to less than 15 seconds to demonstrate a decreased risk for falls and improved LE strength.   Baseline: 18.97 seconds without UE use; 15.81s no UE support  Goal status: PARTIALLY MET   4.  Patient will ambulate at least 31ft during with LRAD to demonstrate improved community ambulation tolerance Baseline: 267' SBA no AD + AFO; 413' mod I no AD or AFO  Goal status: MET  NEW SHORT TERM GOALS FOR EXTENDED POC:   Target date: 01/13/2024  Pt will improve 5 x STS to less than or equal to 12 seconds w/o UE support to demonstrate improved functional strength and transfer efficiency.   Baseline: 15.81s no UE support (6/20) Goal status: INITIAL  2.  Pt will be able to bowl w/10# ball without LOB for improved functional mobility and return to sport  Baseline:  Goal status:  INITIAL  3.  Pt will improve gait velocity to at least 0.9 m/s w/LRAD and AFO for improved gait efficiency and independence   Baseline: 0.7 m/s no AD or AFO Goal status: INITIAL   NEW LONG TERM GOALS FOR EXTENDED POC:  Target date: 02/10/2024  Pt will improve gait velocity to at least 1.1 m/s w/LRAD and AFO for improved gait efficiency and independence   Baseline: 0.7 m/s no AD or AFO Goal status: INITIAL  2.  to be assessed and LTG updated  Baseline:  Goal status: INITIAL  3.  Pt will tolerate 10 minutes of boxing w/BUEs for improved endurance, UE coordination and return to sport  Baseline:  Goal status: INITIAL  4.  Pt will be independent with final HEP for improved strength, balance, transfers and gait.  Baseline:  Goal status: IN PROGRESS   ASSESSMENT:  CLINICAL IMPRESSION: Patient seen for skilled PT session with emphasis on progressing gross NMR with dual task overlay. L AFO donned through entirety of session. He was able to progress to 2. on treadmill with verbal cues for increased L knee drive. With increasing speed- noted increased instances of L LE extensor tone. Curtsy squats to simulate landing pose of bowling. Improved L LE dynamic balance, increased challenge when completing task balancing on R LE. This is likely due to patients need to break mild tone in L LE displacing weight. Patient with noted preference for motor tasks when presented with dual cog+ motor. Continue POC.   OBJECTIVE IMPAIRMENTS: Abnormal gait, decreased activity tolerance, decreased balance, decreased endurance,  decreased mobility, difficulty walking, decreased ROM, decreased strength, increased fascial restrictions, impaired flexibility, impaired sensation, impaired tone, and pain.   ACTIVITY LIMITATIONS: carrying, standing, transfers, and locomotion level  PARTICIPATION LIMITATIONS: driving, community activity, occupation, and yard work  PERSONAL FACTORS: Age, Time since onset of  injury/illness/exacerbation, Transportation, and 1-2 comorbidities: see above are also affecting patient's functional outcome.   REHAB POTENTIAL: Good  CLINICAL DECISION MAKING: Evolving/moderate complexity  EVALUATION COMPLEXITY: Moderate  PLAN:  PT FREQUENCY/DURATION: 1x/week for 12 weeks (recert)   PLANNED INTERVENTIONS: 02835- PT Re-evaluation, 97110-Therapeutic exercises, 97530- Therapeutic activity, 97112- Neuromuscular re-education, 97535- Self Care, 02859- Manual therapy, [phone]- Gait training, [phone]- Orthotic/Prosthetic subsequent, and [phone]- Aquatic Therapy  PLAN FOR NEXT SESSION: Work on sports Advice worker - basketball dribbling, return to bowling, boxing, wants to return to running as able, STG assessment  stretching program with emphasis on hamstring tightness, quad strengthening as able, high intensity gait training, continue conversation about additional botox (recommended vs not pending gait), L NMR re-ed   For all possible CPT codes, reference the Planned Interventions line above.     Check all conditions that are expected to impact treatment: {Conditions expected to impact treatment:Contractures, spasticity or fracture relevant to requested treatment and Neurological condition and/or seizures   If treatment provided at initial evaluation, no treatment charged due to lack of authorization.      Delon DELENA Pop, PT Delon DELENA Pop, PT, DPT, CBIS  01/06/2024, 3:56 PM

## 2024-01-07 ENCOUNTER — Encounter (HOSPITAL_COMMUNITY): Payer: Self-pay | Admitting: Physician Assistant

## 2024-01-07 NOTE — Telephone Encounter (Signed)
 Message acknowledged and reviewed.  Patient was recently seen at [facility] this past week.  Patient informed provider that he is currently on Valium  2 mg 2 times daily for the management of his anxiety and due to seizure-like activity.  Provider discussed patient with attending psychiatric provider and had concerns regarding patient's use of Valium  in the setting of recent intracerebral hemorrhage.  I see that patient was placed on buspirone  15 mg 3 times daily for the management of his anxiety. Due to risk associated with benzodiazapine use (Valium ) are there any other anxiolytic options besides benzodiazepine that can be considered to help manage patient's anxiety?

## 2024-01-08 MED ORDER — BUSPIRONE HCL 15 MG PO TABS: 15.0000 mg | ORAL_TABLET | Freq: Three times a day (TID) | ORAL | 1 refills | Status: DC

## 2024-01-08 NOTE — Telephone Encounter (Signed)
 Noted. I was thinking of using an SSRI such as Lexapro to manage his anxiety so the patient doesn't have to rely solely on Valium  2 mg 2 times daily. How long do you suspect that the patient will need to utilize Valium  for his seizure like activity?

## 2024-01-08 NOTE — Telephone Encounter (Signed)
 Thank you for the update; I was clear with him that I would not continue to prescribe valium  once established with behavioral medicine.

## 2024-01-08 NOTE — Telephone Encounter (Signed)
 Noted! Thank you

## 2024-01-09 NOTE — Patient Outreach (Signed)
 BSW received an incoming call from Patient. Patient reports he was suppose to hear back from Dr. Collene by the end of this past week. Patient states he only had enough medication (Valium ) for the weekend. Patient reports not taking medication as instructed for this weekend in order to have at least one pill today. Patient would like for BSW to message Dr. Collene requesting him to call patient back. BSW messaged provider.   Patient also stated he has not heard back from DSS regarding his benefit pause on his EBT card. Patient intends to visit local office soon if he does not have a response by tomorrow. BSW confirmed he will try to get in contact as well with DSS and update patient as able to.

## 2024-01-11 ENCOUNTER — Other Ambulatory Visit: Payer: Self-pay | Admitting: Licensed Clinical Social Worker

## 2024-01-12 ENCOUNTER — Encounter: Admitting: Diagnostic Neuroimaging

## 2024-01-12 NOTE — Progress Notes (Signed)
 Patient had some questions, a virtual visit was setup. This encounter was created in error. Instead, I called patient. Continues with anxiety issues. Planning to transition to lexapro and away from valium . Advised patient to follow up with behavioral health clinic to manage anxiety issues.   EDUARD FABIENE HANLON, MD 01/12/2024, 3:53 PM Certified in Neurology, Neurophysiology and Neuroimaging  [facility] [address], [address] [place], [place] [address] [phone]

## 2024-01-13 ENCOUNTER — Other Ambulatory Visit: Payer: Self-pay

## 2024-01-13 ENCOUNTER — Ambulatory Visit: Admitting: Physical Therapy

## 2024-01-13 ENCOUNTER — Telehealth (HOSPITAL_COMMUNITY): Payer: Self-pay | Admitting: Physician Assistant

## 2024-01-13 DIAGNOSIS — R2681 Unsteadiness on feet: Secondary | ICD-10-CM

## 2024-01-13 DIAGNOSIS — R2689 Other abnormalities of gait and mobility: Secondary | ICD-10-CM

## 2024-01-13 DIAGNOSIS — M6281 Muscle weakness (generalized): Secondary | ICD-10-CM

## 2024-01-13 NOTE — Patient Instructions (Signed)
 Visit Information  Erik Santana was given information about Medicaid Managed Care team care coordination services as a part of their [facility] Medicaid benefit. [name]. verbally consented to engagement with the [facility] Managed Care team.   If you are experiencing a medical emergency, please call 911 or report to your local emergency department or urgent care.   If you have a non-emergency medical problem during routine business hours, please contact your provider's office and ask to speak with a nurse.   For questions related to your [facility] health plan, please call: [phone] or go here:[email or web address]  If you would like to schedule transportation through your [facility] plan, please call the following number at least 2 days in advance of your appointment: [phone].   You can also use the MTM portal or MTM mobile app to manage your rides. Reimbursement for transportation is available through [facility]! For the portal, please go to [email or web address]  Call the [facility] Crisis Line at [phone], at any time, 24 hours a day, 7 days a week. If you are in danger or need immediate medical attention call 911.  If you would like help to quit smoking, call 1-800-QUIT-NOW ((640) 726-9332) OR Espaol: 1-855-Djelo-Ya (8-144-664-6430) o para ms informacin haga clic aqu or Text READY to 799-599 to register via text  Mr. Aker - following are the goals we discussed in your visit today:   Goals Addressed   None     Patient verbalizes understanding of instructions and care plan provided today and agrees to view in MyChart. Active MyChart status and patient understanding of how to access instructions and care plan via MyChart confirmed with patient.     Telephone follow up appointment with Managed Medicaid care management team member scheduled for: 02/08/2024 st 1:15pm.   [name], BSW [place]/[facility] - [facility] Social  Worker [phone]   Following is a copy of your plan of care:  There are no care plans that you recently modified to display for this patient.

## 2024-01-13 NOTE — Therapy (Signed)
 OUTPATIENT PHYSICAL THERAPY NEURO TREATMENT   Patient Name: Erik Santana. MRN: [id number] DOB:1990-11-01, 33 y.o., male 60 Date: 01/13/2024   PCP: Greig Drones, NP REFERRING PROVIDER: Pegge Toribio PARAS, PA-C   END OF SESSION:  PT End of Session - 01/13/24 1452     Visit Number 15    Number of Visits 23    Date for PT Re-Evaluation 03/09/24    Authorization Type [place] [employer]    PT Start Time 1451    PT Stop Time 1532    PT Time Calculation (min) 41 min    Equipment Utilized During Treatment --    Activity Tolerance Patient tolerated treatment well    Behavior During Therapy [facility] for tasks assessed/performed;Anxious          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   Past Surgical History:  Procedure Laterality Date   ESOPHAGOGASTRODUODENOSCOPY N/A 10/18/2012   Procedure: ESOPHAGOGASTRODUODENOSCOPY (EGD);  Surgeon: Lamar JONETTA Aho, MD;  Location: [facility];  Service: Endoscopy;  Laterality: N/A;   ESOPHAGOGASTRODUODENOSCOPY (EGD) WITH PROPOFOL  N/A 10/18/2012   Procedure: ESOPHAGOGASTRODUODENOSCOPY (EGD) WITH PROPOFOL ;  Surgeon: Lamar JONETTA Aho, MD;  Location: [facility];  Service: Endoscopy;  Laterality: N/A;   FINGER SURGERY     IR ANGIO INTRA EXTRACRAN SEL COM CAROTID INNOMINATE BILAT MOD SED  08/23/2023   IR ANGIO VERTEBRAL SEL SUBCLAVIAN INNOMINATE UNI L MOD SED  08/23/2023   IR ANGIO VERTEBRAL SEL VERTEBRAL UNI R MOD SED  08/23/2023   OPEN REDUCTION INTERNAL FIXATION (ORIF) PROXIMAL PHALANX Right 10/12/2022   Procedure: Right ring finger middle phalanx and proximal interphalangeal joint closed reduction internal percutaneous pinning;  Surgeon: Alyse Agent, MD;  Location: [facility];  Service: Orthopedics;  Laterality: Right;   Patient Active Problem List   Diagnosis Date Noted   Seizure-like activity (HCC)  12/16/2023   PTSD (post-traumatic stress disorder) 12/07/2023   Left hemiparesis (HCC) 12/07/2023   Spasticity 12/07/2023   Emotional lability 10/05/2023   Left foot drop 10/05/2023   Cognitive change 08/30/2023   Intraparenchymal hematoma of brain (HCC) 08/24/2023   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   Anxiety state 10/27/2012   Hematemesis 10/18/2012   Other esophagitis 10/18/2012   Mallory-Weiss tear 10/18/2012    ONSET DATE: 09/22/2023 (referral date)  REFERRING DIAG: S06.33AA (ICD-10-CM) - Intraparenchymal hematoma of brain due to trauma [facility])  THERAPY DIAG:  Other abnormalities of gait and mobility  Muscle weakness (generalized)  Unsteadiness on feet  Rationale for Evaluation and Treatment: Rehabilitation  SUBJECTIVE:  SUBJECTIVE STATEMENT: Patient arrives to clinic with mom, wearing AFO and holding Lofstrand crutch. Reports he is doing okay. Is out of Valium  and has not heard back from [facility] regarding medication management despite calling multiple times. No falls. Feels like he can only move his L foot in his sleep.   Pt accompanied by: self and mother, Erik Santana  PERTINENT HISTORY: parenchymal hemorrhage centered at the right frontal parietal junction, intraparenchymal hematoma within the superior right frontal lobe, seizures   PAIN:  Are you having pain? 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 Yes. 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   OBJECTIVE:  Note: Objective measures were completed at Evaluation unless otherwise noted.  DIAGNOSTIC FINDINGS:   MR 08/22/2023: IMPRESSION: Unchanged appearance of intraparenchymal hematoma within the superior right frontal lobe. No mass lesion.  COGNITION: Overall cognitive status: Within functional limits for tasks assessed   VITALS:  There were no vitals filed for this visit.                                                                                                   TREATMENT:   Self-care/home management   Entirety of session spent providing therapeutic listening as pt and mother discussed recent appointments at [facility] and not knowing what to do regarding medication management. Pt's mother reports pt's anxiety is extremely limiting for pt and he is unable to calm himself down once a panic attack occurs. Pt has a history of anxiety since childhood and pt hesitant to try a new medication as he tried several when he was younger and they were not helpful. Pt reports [facility] PA mentioned starting Lexapro , but pt is hesitant to try this as his sister had an adverse reaction to it. Informed pt that these medications effect every one differently and what may work for one person may not work for him, so encouraged him to keep an open mind as finding a medication that works for him will be trial and error. Strongly encouraged pt to either call [facility] or go to [facility] following today's session to inquire about medication, as he reports PA told him he would call pt last Friday and he has still not heard back. Pt's anxiety is a major limiting factor in PT progress and pt  acknowledges this. Pt reports he can move his foot in his sleep but not when he  is awake. Inquired how pt is able to know he is moving his foot when he is asleep and he further states he can move it first thing in the morning when he is sleepy or when he is more relaxed. Suspect pt is triggering his extensor tone when this happens, which pt later agrees with. Encouraged pt to try to talk to his foot when he wants to PF/DF and keep a calm mind, as he becomes very rigid when he is anxious. Pt reports he will try.    PATIENT EDUCATION: Education details: See above  Person educated: Patient and Parent Education method: Medical illustrator Education comprehension: verbalized understanding and needs further education  HOME EXERCISE PROGRAM: Access Code: [email or web address] URL: [email or web address] Date: 11/01/2023 Prepared by: Lauraine Grumbling  Exercises - Staggered Sit-to-Stand  - 1 x daily - 7 x weekly - 3 sets - 10 reps - Backward Walking with Counter Support  - 1 x daily - 7 x weekly - 5 sets  Access Code: [email or web address] URL: [email or web address] Date: 11/04/2023 Prepared by: Marlon Zaeden Lastinger  Exercises - Calf stretch  - 1 x daily - 7 x weekly - 60-90 seconds hold - Squat with Resistance at Thighs  - 1 x daily - 7 x weekly - 3 sets - 10 reps - Side Stepping with Resistance at Thighs  - 1 x daily - 7 x weekly - 3 sets - 10 reps  GOALS: Goals reviewed with patient? Yes   LONG TERM GOALS: Target date: 12/16/2023  Patient will report demonstrate independence with final HEP in order to maintain current gains and continue to progress after physical therapy discharge.   Baseline: To be provided  Goal status: IN PROGRESS   2.  Patient will improve gait speed to 0.5 m/s or greater to indicate an improvement from being a household ambulator to limited community ambulator.   Baseline: 0.34 m/s with R lofstrand crutch and L AFO (SBA); 0.7 m/s no AD or AFO  Goal status:  MET  3.  Patient will improve their 5x Sit to Stand score to less than 15 seconds to demonstrate a decreased risk for falls and improved LE strength.   Baseline: 18.97 seconds without UE use; 15.81s no UE support  Goal status: PARTIALLY MET   4.  Patient will ambulate at least 365ft during with LRAD to demonstrate improved community ambulation tolerance Baseline: 267' SBA no AD + AFO; 413' mod I no AD or AFO  Goal status: MET  NEW SHORT TERM GOALS FOR EXTENDED POC:   Target date: 01/13/2024  Pt will improve 5 x STS to less than or equal to 12 seconds w/o UE support to demonstrate improved functional strength and transfer efficiency.   Baseline: 15.81s no UE support (6/20) Goal status: INITIAL  2.  Pt will be able to bowl w/10# ball without LOB for improved functional mobility and return to sport  Baseline:  Goal status: INITIAL  3.  Pt will improve gait velocity to at least 0.9 m/s w/LRAD and AFO for improved gait efficiency and independence   Baseline: 0.7 m/s no AD or AFO Goal status: INITIAL   NEW LONG TERM GOALS FOR EXTENDED POC:  Target date: 02/10/2024  Pt will improve gait velocity to at least 1.1 m/s w/LRAD and AFO for improved gait efficiency and independence   Baseline: 0.7 m/s no AD or AFO Goal status: INITIAL  2.  to be assessed and LTG updated  Baseline:  Goal status: INITIAL  3.  Pt will tolerate 10 minutes of boxing w/BUEs for improved endurance, UE coordination and return to sport  Baseline:  Goal status: INITIAL  4.  Pt will be independent with final HEP for improved strength, balance, transfers and gait.  Baseline:  Goal status: IN PROGRESS   ASSESSMENT:  CLINICAL IMPRESSION: Session limited as pt's anxiety is not medically managed and majority of session spent providing therapeutic listening as pt and mother recall medical appointments this week and lack of communication from [facility] Pt reports the MD at the hospital wrote that he had taken  Ativan  the day he had a stroke, but pt reports he did not take Ativan  and this is incorrect in his chart. Pt encouraged to reach out to [facility] to inquire about medication management as he has not heard back from PA and is out of valium . Pt's anxiety is a major limiting factor in pt's physical therapy as pt has increased spasms and poor emotional regulation when this occurs. Continue POC.   OBJECTIVE IMPAIRMENTS: Abnormal gait, decreased activity tolerance, decreased balance, decreased endurance, decreased mobility, difficulty walking, decreased ROM, decreased strength, increased fascial restrictions, impaired flexibility, impaired sensation, impaired tone, and pain.   ACTIVITY LIMITATIONS: carrying, standing, transfers, and locomotion level  PARTICIPATION LIMITATIONS: driving, community activity, occupation, and yard work  PERSONAL FACTORS: Age, Time since onset of injury/illness/exacerbation, Transportation, and 1-2 comorbidities: see above are also affecting patient's functional outcome.   REHAB POTENTIAL: Good  CLINICAL DECISION MAKING: Evolving/moderate complexity  EVALUATION COMPLEXITY: Moderate  PLAN:  PT FREQUENCY/DURATION: 1x/week for 12 weeks (recert)   PLANNED INTERVENTIONS: 02835- PT Re-evaluation, 97110-Therapeutic exercises, 97530- Therapeutic activity, 97112- Neuromuscular re-education, 97535- Self Care, 02859- Manual therapy, [phone]- Gait training, [phone]- Orthotic/Prosthetic subsequent, and [phone]- Aquatic Therapy  PLAN FOR NEXT SESSION: Work on sports Advice worker - basketball dribbling, return to bowling, boxing, wants to return to running as able, STG assessment  stretching program with emphasis on hamstring tightness, quad strengthening as able, high intensity gait training, continue conversation about additional botox (recommended vs not pending gait), L NMR re-ed   For all possible CPT codes, reference the Planned Interventions line above.     Check all conditions that  are expected to impact treatment: {Conditions expected to impact treatment:Contractures, spasticity or fracture relevant to requested treatment and Neurological condition and/or seizures   If treatment provided at initial evaluation, no treatment charged due to lack of authorization.      Cru Kritikos E Arliss Hepburn, PT Delon DELENA Pop, PT, DPT, CBIS  01/13/2024, 3:46 PM

## 2024-01-13 NOTE — Patient Outreach (Signed)
 Complex Care Management   Visit Note  01/13/2024  Name:  Erik Santana. MRN: [id number] DOB: [birth year]  Situation: Referral received for Complex Care Management related to SDOH Barriers:  Food insecurity Needing to apply for disability I obtained verbal consent from Patient.  Visit completed with patient  on the phone  Background:   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    Assessment: Bsw held f/u call with patient. Patient was alert and cognitive. Patient reports he was able to provide the required letter to DSS to have his food stamps reactived. Patient confirmed EBT card has a balance and was able to purchase food recently. Patient also reports that a case worker with [facility]ability [facility]acy center recently visited him in person and signed paperwork. Patient reports he was informed of all the required documentation he needs to apply for SSDI and has a tentative appointment scheduled for February 07, 2024 at 2:30pm at [facility] in [place] with his case worker Pam. Patient confirmed he has contact information for case worker and will follow up as needed. BSW reminded patient of the housing/utility resource available to him through his insurance, but patient stated he preferred to wait on that since he is currently staying with is mother and save it for the future since it is a yearly assistance. No other resources were provided and patient agreed to move calls to 2 weeks. Patient was appreciative of support so far.   SDOH Interventions    Flowsheet Row Patient Outreach from 12/22/2023 in [place] POPULATION HEALTH DEPARTMENT Patient Outreach Telephone from 11/29/2023 in [place] POPULATION HEALTH DEPARTMENT Office Visit from 11/09/2023 in [facility] Primary [facility] at [facility]  SDOH Interventions     Food Insecurity Interventions Community Resources  Provided [employer] Provided Intervention Not Indicated, AMB Referral  Housing Interventions Intervention Not Indicated Intervention Not Indicated Intervention Not Indicated  Transportation Interventions Intervention Not Indicated Intervention Not Indicated Intervention Not Indicated  Utilities Interventions Community Resources Provided  [employer] - additional benefits - house/utility allowance.] Intervention Not Indicated Intervention Not Indicated  Alcohol Usage Interventions -- -- Intervention Not Indicated (Score <7)  Financial Strain Interventions -- [employer] Provided Intervention Not Indicated  Physical Activity Interventions -- -- Intervention Not Indicated  Stress Interventions -- -- Intervention Not Indicated  Social Connections Interventions -- -- Intervention Not Indicated  Health Literacy Interventions -- -- Intervention Not Indicated      Recommendation:   Continue working with [facility] caseworker Pam to apply for SSDI.   Follow Up Plan:   Telephone follow up appointment date/time:  02/08/2024 at 1:15pm  [name], BSW [place]/VBCI - [facility] Social Worker [phone]

## 2024-01-14 ENCOUNTER — Other Ambulatory Visit: Payer: Self-pay | Admitting: Physical Medicine and Rehabilitation

## 2024-01-16 ENCOUNTER — Other Ambulatory Visit (HOSPITAL_COMMUNITY): Payer: Self-pay | Admitting: Physician Assistant

## 2024-01-16 DIAGNOSIS — F4310 Post-traumatic stress disorder, unspecified: Secondary | ICD-10-CM

## 2024-01-16 DIAGNOSIS — F411 Generalized anxiety disorder: Secondary | ICD-10-CM

## 2024-01-16 DIAGNOSIS — F4323 Adjustment disorder with mixed anxiety and depressed mood: Secondary | ICD-10-CM

## 2024-01-16 MED ORDER — ESCITALOPRAM OXALATE 5 MG PO TABS: 5.0000 mg | ORAL_TABLET | Freq: Every day | ORAL | 1 refills | Status: DC

## 2024-01-16 NOTE — Telephone Encounter (Signed)
 Message acknowledged and reviewed.

## 2024-01-16 NOTE — Telephone Encounter (Signed)
 Noted

## 2024-01-16 NOTE — Telephone Encounter (Signed)
 Thank you for the messages. I was thinking about placing the patient on Lexapro  for the management of his anxiety. With the use of SSRIs such Lexapro , there is a risk, albeit rare, of bleeding. Should I hold off on SSRIs due to patient's past history of intracerebral hemorrhage?

## 2024-01-16 NOTE — Progress Notes (Signed)
 Provider was able to reach out to patient to discuss medication management options.  Patient is currently struggling with anxiety and panic attacks.  Provider recommended patient be placed on escitalopram  5 mg daily for the management of his anxiety and panic attacks.  Provider reached out to patient's neurologist and rehabilitation medicine provider to discuss plan for patient to be on escitalopram .  Provider was able to receive confirmation from his neurologist and rehabilitation medicine provider that Lexapro  was an acceptable medication for the management of his anxiety and panic attacks.  Patient's medication to be e-prescribed to pharmacy of choice.

## 2024-01-16 NOTE — Telephone Encounter (Signed)
 I would say he is OK for the SSRI; bleeding has been resolving on follow up imaging. Will tag his neurologist Dr. Margaret here just in case you have another opinion.

## 2024-01-17 ENCOUNTER — Other Ambulatory Visit: Payer: Self-pay | Admitting: Family

## 2024-01-17 ENCOUNTER — Ambulatory Visit (HOSPITAL_COMMUNITY): Admitting: Mental Health

## 2024-01-17 DIAGNOSIS — J3089 Other allergic rhinitis: Secondary | ICD-10-CM

## 2024-01-17 DIAGNOSIS — F411 Generalized anxiety disorder: Secondary | ICD-10-CM

## 2024-01-17 NOTE — Progress Notes (Signed)
 Therapist connected to tele-assessment with pt. In reviewing current location for ability to hold virtual appointment and confidentiality of session; pt reported to be currently living in [place] since March of 2025. Notes plans to return back to [facility] at some point. Notes concerns for medications as well and was switched to new medications. Therapist educated with chart indicating [employer] address and [facility] only seeing [employer]. Provided x 2 referrals for OPT and medication management services. Therapist apologized for the inconvenience and assessed for safety concerns.

## 2024-01-19 ENCOUNTER — Other Ambulatory Visit: Payer: Self-pay | Admitting: Physical Medicine and Rehabilitation

## 2024-01-19 ENCOUNTER — Telehealth: Payer: Self-pay | Admitting: Physical Medicine and Rehabilitation

## 2024-01-19 ENCOUNTER — Other Ambulatory Visit: Payer: Self-pay | Admitting: Licensed Clinical Social Worker

## 2024-01-19 DIAGNOSIS — G8194 Hemiplegia, unspecified affecting left nondominant side: Secondary | ICD-10-CM

## 2024-01-19 DIAGNOSIS — F411 Generalized anxiety disorder: Secondary | ICD-10-CM

## 2024-01-19 DIAGNOSIS — M21372 Foot drop, left foot: Secondary | ICD-10-CM

## 2024-01-19 DIAGNOSIS — I629 Nontraumatic intracranial hemorrhage, unspecified: Secondary | ICD-10-CM

## 2024-01-19 NOTE — Telephone Encounter (Signed)
 Patient needs to speak to Dr. Emeline about medication. He has seen the psychologist and they [place] now [place].  He needs medication but also has a lot of questions.

## 2024-01-20 ENCOUNTER — Ambulatory Visit: Admitting: Physical Therapy

## 2024-01-20 DIAGNOSIS — R2689 Other abnormalities of gait and mobility: Secondary | ICD-10-CM

## 2024-01-20 DIAGNOSIS — M6281 Muscle weakness (generalized): Secondary | ICD-10-CM

## 2024-01-20 DIAGNOSIS — R278 Other lack of coordination: Secondary | ICD-10-CM

## 2024-01-20 DIAGNOSIS — R2681 Unsteadiness on feet: Secondary | ICD-10-CM

## 2024-01-20 NOTE — Patient Instructions (Signed)
 Visit Information  Mr. Witherspoon was given information about Medicaid Managed Care team care coordination services as a part of their [facility] benefit. [name]. verbally consented to engagement with the [facility] Managed Care team.   If you are experiencing a medical emergency, please call 911 or report to your local emergency department or urgent care.   If you have a non-emergency medical problem during routine business hours, please contact your provider's office and ask to speak with a nurse.   For questions related to your [facility] health plan, please call: [phone] or go here:[email or web address]  If you would like to schedule transportation through your [facility] plan, please call the following number at least 2 days in advance of your appointment: [phone].   You can also use the MTM portal or MTM mobile app to manage your rides. Reimbursement for transportation is available through [facility]! For the portal, please go to [email or web address]  Call the [facility] Crisis Line at [phone], at any time, 24 hours a day, 7 days a week. If you are in danger or need immediate medical attention call 911.  If you would like help to quit smoking, call 1-800-QUIT-NOW ((563) 024-2688) OR Espaol: 1-855-Djelo-Ya (8-144-664-6430) o para ms informacin haga clic aqu or Text READY to 799-599 to register via text  Mr. Mclennan - following are the goals we discussed in your visit today:   Goals Addressed             This Visit's Progress    LCSW VBCI Social Work Care Plan   On track    Problems:   Disease Management support and education needs related to Anxiety with Panic Symptoms, and PTSD  CSW Clinical Goal(s):   Over the next 90 days the Patient will attend all scheduled medical appointments as evidenced by patient report and care team review of appointment completion in electronic MEDICAL RECORD NUMBER  demonstrate a reduction in symptoms  related to Anxiety with Panic Symptoms, PTSD .  Interventions:  Mental Health:  Evaluation of current treatment plan related to Anxiety with Panic Symptoms, and PTSD Active listening / Reflection utilized Financial risk analyst / information provided Emotional Support Provided Mindfulness or Relaxation training provided Provided general psycho-education for mental health needs Quality of sleep assessed & Sleep Hygiene techniques promoted Reviewed mental health medications and discussed importance of compliance: Pt reports he will address med concerns with [facility] Counselor at upcoming appt Solution-Focued Strategies employed: Suicidal Ideation/Homicidal Ideation assessed: Pt denies  Patient Goals/Self-Care Activities:  Continue taking your medication as prescribed.   Increase coping skills and healthy habits  Plan:   Telephone follow up appointment with care management team member scheduled for:  2-4 weeks        Please see education materials related to topics discussed provided by MyChart link.  Patient verbalizes understanding of instructions and care plan provided today and agrees to view in MyChart. Active MyChart status and patient understanding of how to access instructions and care plan via MyChart confirmed with patient.     Licensed Clinical Social Worker will 07/24  [name], LCSW [place]  [facility], [facility] Clinical Social Worker Direct Dial: [phone]  Fax: [phone] Website: [email or web address] 11:45 AM   Following is a copy of your plan of care:  There are no care plans that you recently modified to display for this patient.

## 2024-01-20 NOTE — Therapy (Signed)
 OUTPATIENT PHYSICAL THERAPY NEURO TREATMENT   Patient Name: Erik Santana. MRN: [id number] DOB:06-06-1991, 33 y.o., male 71 Date: 01/20/2024   PCP: Greig Drones, NP REFERRING PROVIDER: Pegge Toribio PARAS, PA-C   END OF SESSION:  PT End of Session - 01/20/24 1447     Visit Number 16    Number of Visits 23    Date for PT Re-Evaluation 03/09/24    Authorization Type [place] Medicaid    PT Start Time 1446    PT Stop Time 1527    PT Time Calculation (min) 41 min    Equipment Utilized During Treatment Other (comment);Gait belt   L AFO   Activity Tolerance Patient tolerated treatment well    Behavior During Therapy Anxious;Flat affect          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   Past Surgical History:  Procedure Laterality Date   ESOPHAGOGASTRODUODENOSCOPY N/A 10/18/2012   Procedure: ESOPHAGOGASTRODUODENOSCOPY (EGD);  Surgeon: Lamar JONETTA Aho, MD;  Location: [facility];  Service: Endoscopy;  Laterality: N/A;   ESOPHAGOGASTRODUODENOSCOPY (EGD) WITH PROPOFOL  N/A 10/18/2012   Procedure: ESOPHAGOGASTRODUODENOSCOPY (EGD) WITH PROPOFOL ;  Surgeon: Lamar JONETTA Aho, MD;  Location: [facility];  Service: Endoscopy;  Laterality: N/A;   FINGER SURGERY     IR ANGIO INTRA EXTRACRAN SEL COM CAROTID INNOMINATE BILAT MOD SED  08/23/2023   IR ANGIO VERTEBRAL SEL SUBCLAVIAN INNOMINATE UNI L MOD SED  08/23/2023   IR ANGIO VERTEBRAL SEL VERTEBRAL UNI R MOD SED  08/23/2023   OPEN REDUCTION INTERNAL FIXATION (ORIF) PROXIMAL PHALANX Right 10/12/2022   Procedure: Right ring finger middle phalanx and proximal interphalangeal joint closed reduction internal percutaneous pinning;  Surgeon: Alyse Agent, MD;  Location: [facility];  Service: Orthopedics;  Laterality: Right;   Patient Active Problem List   Diagnosis Date Noted   Seizure-like activity  (HCC) 12/16/2023   PTSD (post-traumatic stress disorder) 12/07/2023   Left hemiparesis (HCC) 12/07/2023   Spasticity 12/07/2023   Emotional lability 10/05/2023   Left foot drop 10/05/2023   Cognitive change 08/30/2023   Intraparenchymal hematoma of brain (HCC) 08/24/2023   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   Anxiety state 10/27/2012   Hematemesis 10/18/2012   Other esophagitis 10/18/2012   Mallory-Weiss tear 10/18/2012    ONSET DATE: 09/22/2023 (referral date)  REFERRING DIAG: S06.33AA (ICD-10-CM) - Intraparenchymal hematoma of brain due to trauma [facility])  THERAPY DIAG:  Other abnormalities of gait and mobility  Muscle weakness (generalized)  Unsteadiness on feet  Other lack of coordination  Rationale for Evaluation and Treatment: Rehabilitation  SUBJECTIVE:  SUBJECTIVE STATEMENT: Patient arrives to clinic with mom, wearing AFO and holding Lofstrand crutch. Reports he is fine, tried to take the Lexapro  for 3 days and stated it made him feel like he was bleeding internally and going crazy so he stopped taking it. No falls. Feels like he needs to control his toes in order to balance.   Pt accompanied by: self and mother, [name]  PERTINENT HISTORY: parenchymal hemorrhage centered at the right frontal parietal junction, intraparenchymal hematoma within the superior right frontal lobe, seizures   PAIN:  Are you having pain? 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 Yes. 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   OBJECTIVE:  Note: Objective measures were completed at Evaluation unless otherwise noted.  DIAGNOSTIC FINDINGS:   MR 08/22/2023: IMPRESSION: Unchanged appearance of intraparenchymal hematoma within the superior right frontal lobe. No mass lesion.  COGNITION: Overall cognitive status: Within functional limits for tasks assessed   VITALS:  There were no vitals filed for this visit.                                                                                                   TREATMENT:   Self-care/home management  Provided therapeutic listening as pt and mother discuss frustration w/pt's anxiety management and experience w/pt taking Lexapro . Pt reports he took the Lexapro  for 3 days and it made him feel like I was bleeding internally, like from my esophagus and everything in my body stiffened up. Informed pt that he likely had anxiety taking the medication as his sister had a bad reaction to it in the past and pt was fearful of taking it due to a fear of bleeding. Pt in agreement that this likely occurred, but has no medical alternative and is not scheduled to see neuropsych until 8/28.  Pt reports frustration w/inability to control toes as this controls his balance, especially on inclines/declines. Pt reports he has not been working on moving his foot and talking to it as encouraged by therapist. Pt states he can only move his foot in his sleep. Informed pt that he is feeling spasticity move his foot and that his AFO is designed to help him be stable. Pt has been fighting against the  brace, seemingly making his spasticity worse. Pt reports he will work on this.  Recommended pt reach out to [facility] next week and get  on waitlist to ensure continuation of PT once we use all Medicaid visits. Pt and mother verbalized understanding.   Physical Performance  SCORE  ITEM PERFORMANCE   0     1        2       3      4     5   WALK 13.53 sec   X >6.6 5.4-6.6 4.3-5.3 <4.3     X  WALK BACKWARD 38.6 sec  >13.3 8.1-13.3 5.8-8.0 <5.8     X  WALK ON TOES 25.84 sec  >8.9 7.0-8.9 5.4-6.9 <5.4     X  WALK OVER OBSTACLE 16.91 sec  >7.1 5.4-7.1 4.5-5.3 <4.5     X  RUN 10 sec  >2.7 2.0-2.7 1.7-1.9 <1.7     X  SKIP 10.31 sec  >4.0 3.5-4.0 3.0-3.4 <3.0     X  HOP FORWARD (AFFECTED) 0 sec X >7.0 5.3-7.0 4.1-5.2 <4.1     X  BOUND (AFFECTED) 1)0 cm 2)0 cm 3)0 cm X <80 80-103 104-132 >132     X  BOUND (LESS-AFFECTED) 1)24 cm 2)32 cm 3)70 cm  <82 82-105 106-129 >129     X  UP STAIRS DEPENDENT 24.61 sec  >22.8 14.6-22.8 12.3-14.5 <12.3   UP STAIRS INDEPENDENT   X >9.1 7.6-9.1 6.8-7.5 <6.8     X  DOWN STAIRS DEPENDENT 26.705 sec  >24.3 17.6-24.3 12.8-17.5 <12.8   DOWN STAIRS INDEPENDENT  X >8.4 6.6-8.4 5.8-6.5 <5.8     X   SUBTOTAL  9 0 0 0 0  TOTAL SCORE 9/54            PATIENT EDUCATION: Education details: See self-care   Person educated: Patient and Parent Education method: Explanation and Demonstration Education comprehension: verbalized understanding and needs further education  HOME EXERCISE PROGRAM: Access Code: [email or web address] URL: [email or web address] Date: 11/01/2023 Prepared by: Lauraine Grumbling  Exercises - Staggered Sit-to-Stand  - 1 x daily - 7 x weekly - 3 sets - 10 reps - Backward Walking with Counter Support  - 1 x daily - 7 x weekly - 5 sets  Access Code: [email or web address] URL: [email or web address] Date: 11/04/2023 Prepared by: Marlon Kazuma Elena  Exercises - Calf stretch  - 1 x daily - 7 x weekly - 60-90 seconds hold - Squat with Resistance at Thighs   - 1 x daily - 7 x weekly - 3 sets - 10 reps - Side Stepping with Resistance at Thighs  - 1 x daily - 7 x weekly - 3 sets - 10 reps  GOALS: Goals reviewed with patient? Yes   LONG TERM GOALS: Target date: 12/16/2023  Patient will report demonstrate independence with final HEP in order to maintain current gains and continue to progress after physical therapy discharge.   Baseline: To be provided  Goal status: IN PROGRESS   2.  Patient will improve gait speed to 0.5 m/s or greater to indicate an improvement from being a household ambulator to limited community ambulator.   Baseline: 0.34 m/s with R lofstrand crutch and L AFO (SBA); 0.7 m/s no AD or AFO  Goal status: MET  3.  Patient will improve their 5x Sit to Stand score to less than 15 seconds to demonstrate a decreased risk for falls and improved LE strength.   Baseline: 18.97 seconds without UE use; 15.81s no UE support  Goal status: PARTIALLY MET   4.  Patient will ambulate at least 383ft during with LRAD to demonstrate improved community  ambulation tolerance Baseline: 267' SBA no AD + AFO; 413' mod I no AD or AFO  Goal status: MET  NEW SHORT TERM GOALS FOR EXTENDED POC:   Target date: 01/13/2024  Pt will improve 5 x STS to less than or equal to 12 seconds w/o UE support to demonstrate improved functional strength and transfer efficiency.   Baseline: 15.81s no UE support (6/20) Goal status: INITIAL  2.  Pt will be able to bowl w/10# ball without LOB for improved functional mobility and return to sport  Baseline:  Goal status: INITIAL  3.  Pt will improve gait velocity to at least 0.9 m/s w/LRAD and AFO for improved gait efficiency and independence   Baseline: 0.7 m/s no AD or AFO Goal status: INITIAL   NEW LONG TERM GOALS FOR EXTENDED POC:  Target date: 02/10/2024  Pt will improve gait velocity to at least 1.1 m/s w/LRAD and AFO for improved gait efficiency and independence   Baseline: 0.7 m/s no AD or AFO Goal  status: INITIAL  2.  to be assessed and LTG updated  Baseline:  Goal status: INITIAL  3.  Pt will tolerate 10 minutes of boxing w/BUEs for improved endurance, UE coordination and return to sport  Baseline:  Goal status: INITIAL  4.  Pt will be independent with final HEP for improved strength, balance, transfers and gait.  Baseline:  Goal status: IN PROGRESS   ASSESSMENT:  CLINICAL IMPRESSION: Emphasis of skilled PT session on assessing pt's gait and balance via HiMat and providing therapeutic listening as pt discusses ongoing challenges w/anxiety. Pt reports he could only tolerate Lexapro  for 3 days as it made him feel as though he was internally bleeding. Pt has not been prescribed a new medication and does not see neuropsychology until 8/28.  Pt continues to be severely limited by extensor tone in LLE that is exacerbated by uncontrolled anxiety. 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 Continue POC.   OBJECTIVE IMPAIRMENTS: Abnormal gait, decreased activity tolerance, decreased balance, decreased endurance, decreased mobility, difficulty walking, decreased ROM, decreased strength, increased fascial restrictions, impaired flexibility, impaired sensation, impaired tone, and pain.   ACTIVITY LIMITATIONS: carrying, standing, transfers, and locomotion level  PARTICIPATION LIMITATIONS: driving, community activity, occupation, and yard work  PERSONAL FACTORS: Age, Time since onset of injury/illness/exacerbation, Transportation, and 1-2 comorbidities: see above are also affecting patient's functional outcome.   REHAB POTENTIAL: Good  CLINICAL DECISION MAKING: Evolving/moderate complexity  EVALUATION COMPLEXITY: Moderate  PLAN:  PT FREQUENCY/DURATION: 1x/week for 12 weeks (recert)   PLANNED INTERVENTIONS: 02835- PT Re-evaluation, 97110-Therapeutic exercises, 97530- Therapeutic activity, [id number]- Neuromuscular re-education, 97535-  Self Care, 02859- Manual therapy, [id number]- Gait training, [id number]- Orthotic/Prosthetic subsequent, and [id number]- Aquatic Therapy  PLAN FOR NEXT SESSION: STG assessment and send auth. I have my schedule blocked on 8/8 and 8/15 if we get auth approved. Pt wants to do [facility] again. Did they reach out to [facility]?   Work on sports Advice worker - basketball dribbling, return to bowling, boxing, wants to return to running as able   stretching program with emphasis on hamstring tightness, quad strengthening as able, high intensity gait training, continue conversation about additional botox (recommended vs not pending gait), L NMR re-ed   For all possible CPT codes, reference the Planned Interventions line above.     Check all conditions that are expected to impact treatment: {Conditions expected to impact treatment:Contractures, spasticity or fracture relevant to requested treatment  and Neurological condition and/or seizures   If treatment provided at initial evaluation, no treatment charged due to lack of authorization.      Laurna Shetley E Jeff Frieden, PT, DPT  01/20/2024, 4:32 PM

## 2024-01-20 NOTE — Patient Outreach (Signed)
 Complex Care Management   Visit Note  01/11/2024  Name:  Erik Santana. MRN: [id number] DOB: 08/19/1990  Situation: Referral received for Complex Care Management related to Mental/Behavioral Health diagnosis Anxiety and PTSD I obtained verbal consent from Patient.  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    Assessment: Patient Reported Symptoms:  Cognitive Cognitive Status: Alert and oriented to person, place, and time      Neurological Neurological Review of Symptoms: Not assessed    HEENT HEENT Symptoms Reported: Not assessed      Cardiovascular Cardiovascular Symptoms Reported: Not assessed    Respiratory Respiratory Symptoms Reported: Not assesed    Endocrine Endocrine Symptoms Reported: Not assessed    Gastrointestinal Gastrointestinal Symptoms Reported: Not assessed      Genitourinary Genitourinary Symptoms Reported: Not assessed    Integumentary Integumentary Symptoms Reported: Not assessed    Musculoskeletal Musculoskelatal Symptoms Reviewed: Not assessed        Psychosocial Psychosocial Symptoms Reported: Anxiety - if selected complete GAD Behavioral Management Strategies: Counseling, Coping strategies, Medication therapy Major Change/Loss/Stressor/Fears (CP): Medical condition, self        01/04/2024   11:39 AM  Depression screen PHQ 2/9  Decreased Interest 1  Down, Depressed, Hopeless 1  PHQ - 2 Score 2  Altered sleeping 2  Tired, decreased energy 0  Change in appetite 0  Feeling bad or failure about yourself  0  Trouble concentrating 0  Moving slowly or fidgety/restless 2  Suicidal thoughts 0  PHQ-9 Score 6  Difficult doing work/chores Very difficult    There were no vitals filed for this visit.  Medications Reviewed Today     Reviewed by Ezzard Rolin BIRCH, LCSW (Social Worker) on 01/20/24 at 1139  Med List Status: <None>   Medication Order Taking? Sig Documenting Provider Last Dose Status Informant  acetaminophen  (TYLENOL ) 325 MG tablet [id number]  Take 1-2 tablets (325-650 mg total) by mouth every 6 (six) hours as needed for mild pain (pain score 1-3), headache or moderate pain (pain score 4-6) (325 mg for pain 1-3, 6500 mg for pain 4-6).  Patient not taking: Reported on 12/21/2023   Pegge Toribio PARAS, PA-C  Active Self, Pharmacy Records  atorvastatin  (LIPITOR) 20 MG tablet [id number]  Take 1 tablet (20 mg total) by mouth daily. Lorren Greig PARAS, NP  Active   busPIRone  (BUSPAR ) 15 MG tablet [id number]  Take 1 tablet (15 mg total) by mouth 3 (three) times daily. Nwoko, Uchenna E, PA  Active   diazepam  (VALIUM ) 2 MG tablet [id number]  Take 1 tablet (2 mg total) by mouth 2 (two) times daily as needed for anxiety or muscle spasms (use for anxiety/seizure-like episodes). Emeline Joesph BROCKS, DO  Active   escitalopram  (LEXAPRO ) 5 MG tablet [id number]  Take 1 tablet (5 mg total) by mouth daily. Nwoko, Uchenna E, PA  Active   hydrOXYzine  (ATARAX ) 25 MG tablet [id number]  Take 1 tablet (25 mg total) by mouth 3 (three) times daily as needed for anxiety.  Patient not taking: Reported on 12/21/2023   Emeline Joesph BROCKS, DO  Active Self, Pharmacy Records  levETIRAcetam  (KEPPRA ) 750 MG tablet [id number]  Take 1 tablet (750 mg total) by mouth 2 (two) times daily. Take one 500 mg tablet and one 250 mg  tablet together for a total of 750 mg, twice a day Penumalli, Vikram R, MD  Active   lidocaine  (LIDODERM ) 5 % [id number]  Place 1 patch onto the skin every 12 (twelve) hours as needed. Remove & Discard patch within 12 hours or as directed by MD  Patient not taking: Reported on 12/21/2023   Emeline Joesph BROCKS, DO  Active Self, Pharmacy Records  loratadine  (CLARITIN ) 10 MG tablet [id number]  Take 1 tablet (10 mg total) by mouth daily. Lorren Greig PARAS, NP  Active   meclizine   (ANTIVERT ) 25 MG tablet [id number]  Take 1 tablet (25 mg total) by mouth 3 (three) times daily as needed for dizziness. Emeline Joesph BROCKS, DO  Active Self, Pharmacy Records  prazosin  (MINIPRESS ) 1 MG capsule [id number]  Take 1 capsule (1 mg total) by mouth at bedtime. Emeline Joesph BROCKS, DO  Active   QUEtiapine  (SEROQUEL ) 25 MG tablet [id number]  Take 1 tablet (25 mg total) by mouth 2 (two) times daily. Emeline Joesph BROCKS, DO  Active   tiZANidine  (ZANAFLEX ) 4 MG tablet [id number]  Take 1 tablet (4 mg total) by mouth 3 (three) times daily. Emeline Joesph C, DO  Active   Vitamin D , Ergocalciferol , (DRISDOL ) 1.25 MG (50000 UNIT) CAPS capsule [id number]  Take 1 capsule (50,000 Units total) by mouth every 7 (seven) days for 12 doses. Lorren Greig PARAS, NP  Active             Recommendation:   Continue Current Plan of Care  Follow Up Plan:   Telephone follow-up 1-2 weeks  [name], LCSW [facility]  [facility], [facility] Clinical Social Worker Direct Dial: [phone]  Fax: [phone] Website: [email or web address] 11:44 AM

## 2024-01-23 MED ORDER — QUETIAPINE FUMARATE 25 MG PO TABS: 25.0000 mg | ORAL_TABLET | Freq: Two times a day (BID) | ORAL | 0 refills | Status: DC

## 2024-01-23 MED ORDER — BUSPIRONE HCL 10 MG PO TABS
20.0000 mg | ORAL_TABLET | Freq: Three times a day (TID) | ORAL | 2 refills | Status: DC
Start: 2024-01-23 — End: 2024-02-08

## 2024-01-23 MED ORDER — TIZANIDINE HCL 4 MG PO TABS: ORAL_TABLET | ORAL | 3 refills | Status: DC

## 2024-01-23 NOTE — Addendum Note (Signed)
 Addended by: EMELINE SEARCH on: 01/23/2024 05:21 PM   Modules accepted: Orders

## 2024-01-23 NOTE — Telephone Encounter (Signed)
 Spoke with patient after reviewing psych notes; he feels he is doing OK working on strategies to handle anxiety better but still frequently overwhelmed resulting in spasms.   Stopped Lexapro  after 4 doses because it caused unwanted erections, bloating, diarrhea, and generalized off feeling.  Had excellent control of symptoms with PRN valium  but understands why this is not an ideal medication.  Plan:  Until he re-established with new psych, will manage behavioral medicaitons  Refilled seroquel  25 mg BID  Increase Buspar  to 20 mg TID  Given similar class/profile of tizanidine  and atarax , and given he is taking tizanidine  TID with good effect on clonus and no side effects, added one extra dose PRN per day for clonus associated with anxiety.  Follow up with myself and Dr. Sheria in August.

## 2024-01-23 NOTE — Patient Outreach (Signed)
 Complex Care Management   Visit Note  01/19/2024  Name:  Erik Santana. MRN: [id number] DOB: 09/05/1990  Situation: Referral received for Complex Care Management related to Mental/Behavioral Health diagnosis Anxiety and PTSD I obtained verbal consent from Patient.  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    Assessment: Patient Reported Symptoms:  Cognitive Cognitive Status: Alert and oriented to person, place, and time   Health Maintenance Behaviors: Stress management, Annual physical exam  Neurological Neurological Review of Symptoms: Not assessed    HEENT HEENT Symptoms Reported: Not assessed      Cardiovascular Cardiovascular Symptoms Reported: Not assessed    Respiratory Respiratory Symptoms Reported: Not assesed    Endocrine Endocrine Symptoms Reported: Not assessed    Gastrointestinal Gastrointestinal Symptoms Reported: Not assessed      Genitourinary Genitourinary Symptoms Reported: Not assessed    Integumentary Integumentary Symptoms Reported: Not assessed    Musculoskeletal          Psychosocial Psychosocial Symptoms Reported: Anxiety - if selected complete GAD Additional Psychological Details: Pt met with Counselor at [facility] and was dismissed due to residing outside of [facility] temporarily with mother. Pt endorsed anxiety with not having access to a therapist or psychiatrist to assist with symptom management. Strategies discussed to assist with strenghtening support and pt will f/up with specialists. LCSW will collaborate with PCP regarding new referral for med management and therapy Behavioral Management Strategies: Coping strategies, Support system, Medication therapy, Exercise Major Change/Loss/Stressor/Fears (CP): Medical condition, self Techniques to Cope with  Loss/Stress/Change: Diversional activities, Medication        01/04/2024   11:39 AM  Depression screen PHQ 2/9  Decreased Interest   Down, Depressed, Hopeless   PHQ - 2 Score   Altered sleeping   Tired, decreased energy   Change in appetite   Feeling bad or failure about yourself    Trouble concentrating   Moving slowly or fidgety/restless   Suicidal thoughts   PHQ-9 Score   Difficult doing work/chores      Information is confidential and restricted. Go to Review Flowsheets to unlock data.    There were no vitals filed for this visit.  Medications Reviewed Today     Reviewed by D'Arcy Abraha D, LCSW (Social Worker) on 01/23/24 at 1228  Med List Status: <None>   Medication Order Taking? Sig Documenting Provider Last Dose Status Informant  acetaminophen  (TYLENOL ) 325 MG tablet [id number]  Take 1-2 tablets (325-650 mg total) by mouth every 6 (six) hours as needed for mild pain (pain score 1-3), headache or moderate pain (pain score 4-6) (325 mg for pain 1-3, 6500 mg for pain 4-6).  Patient not taking: Reported on 12/21/2023   Pegge Toribio PARAS, PA-C  Active Self, Pharmacy Records  atorvastatin  (LIPITOR) 20 MG tablet [id number]  Take 1 tablet (20 mg total) by mouth daily. Lorren Greig PARAS, NP  Active   busPIRone  (BUSPAR ) 15 MG tablet [id number]  Take 1 tablet (15 mg total) by mouth 3 (three) times daily. Nwoko, Uchenna E, PA  Active   diazepam  (VALIUM ) 2 MG tablet [id number]  Take 1 tablet (2 mg total) by mouth 2 (two) times daily as needed for anxiety or muscle spasms (use for anxiety/seizure-like episodes). Emeline Joesph BROCKS, DO  Active   escitalopram  (  LEXAPRO ) 5 MG tablet [id number]  Take 1 tablet (5 mg total) by mouth daily. Nwoko, Uchenna E, PA  Active   hydrOXYzine  (ATARAX ) 25 MG tablet [id number]  Take 1 tablet (25 mg total) by mouth 3 (three) times daily as needed for anxiety.  Patient not taking: Reported on 12/21/2023   Emeline Joesph BROCKS, DO  Active Self, Pharmacy Records   levETIRAcetam  (KEPPRA ) 750 MG tablet [id number]  Take 1 tablet (750 mg total) by mouth 2 (two) times daily. Take one 500 mg tablet and one 250 mg tablet together for a total of 750 mg, twice a day Penumalli, Vikram R, MD  Active   lidocaine  (LIDODERM ) 5 % [id number]  Place 1 patch onto the skin every 12 (twelve) hours as needed. Remove & Discard patch within 12 hours or as directed by MD  Patient not taking: Reported on 12/21/2023   Emeline Joesph BROCKS, DO  Active Self, Pharmacy Records  loratadine  (CLARITIN ) 10 MG tablet [id number]  Take 1 tablet (10 mg total) by mouth daily. Lorren Greig PARAS, NP  Active   meclizine  (ANTIVERT ) 25 MG tablet [id number]  Take 1 tablet (25 mg total) by mouth 3 (three) times daily as needed for dizziness. Emeline Joesph BROCKS, DO  Active Self, Pharmacy Records  prazosin  (MINIPRESS ) 1 MG capsule [id number]  Take 1 capsule (1 mg total) by mouth at bedtime. Emeline Joesph BROCKS, DO  Active   QUEtiapine  (SEROQUEL ) 25 MG tablet [id number]  Take 1 tablet (25 mg total) by mouth 2 (two) times daily. Emeline Joesph BROCKS, DO  Active   tiZANidine  (ZANAFLEX ) 4 MG tablet [id number]  Take 1 tablet (4 mg total) by mouth 3 (three) times daily. Emeline Joesph C, DO  Active   Vitamin D , Ergocalciferol , (DRISDOL ) 1.25 MG (50000 UNIT) CAPS capsule [id number]  Take 1 capsule (50,000 Units total) by mouth every 7 (seven) days for 12 doses. Lorren Greig PARAS, NP  Active             Recommendation:   Continue Current Plan of Care  Follow Up Plan:   Telephone follow-up 2-4 weeks  [name], LCSW [facility]  [facility] Clinical Social Worker Direct Dial: [phone]  Fax: [phone] Website: [email or web address] 12:42 PM

## 2024-01-23 NOTE — Patient Instructions (Signed)
 Visit Information  Mr. Breit was given information about Medicaid Managed Care team care coordination services as a part of their [facility] Medicaid benefit. Erik Santana. verbally consented to engagement with the [facility] Managed Care team.   If you are experiencing a medical emergency, please call 911 or report to your local emergency department or urgent care.   If you have a non-emergency medical problem during routine business hours, please contact your provider's office and ask to speak with a nurse.   For questions related to your [facility] health plan, please call: [phone] or go here:[email or web address]  If you would like to schedule transportation through your [facility] plan, please call the following number at least 2 days in advance of your appointment: [phone].   You can also use the MTM portal or MTM mobile app to manage your rides. Reimbursement for transportation is available through [facility]! For the portal, please go to [email or web address]  Call the [facility] Crisis Line at [phone], at any time, 24 hours a day, 7 days a week. If you are in danger or need immediate medical attention call 911.  If you would like help to quit smoking, call 1-800-QUIT-NOW (312-455-9271) OR Espaol: 1-855-Djelo-Ya (8-144-664-6430) o para ms informacin haga clic aqu or Text READY to 799-599 to register via text  [name] - following are the goals we discussed in your visit today:   Goals Addressed             This Visit's Progress    LCSW VBCI Social Work Care Plan   On track    Problems:   Disease Management support and education needs related to Anxiety with Panic Symptoms, and PTSD  CSW Clinical Goal(s):   Over the next 90 days the Patient will attend all scheduled medical appointments as evidenced by patient report and care team review of appointment completion in electronic MEDICAL RECORD NUMBER  demonstrate a reduction in symptoms  related to Anxiety with Panic Symptoms, PTSD .  Interventions:  Mental Health:  Evaluation of current treatment plan related to Anxiety with Panic Symptoms, and PTSD Active listening / Reflection utilized Financial risk analyst / information provided Emotional Support Provided Mindfulness or Relaxation training provided Provided general psycho-education for mental health needs Quality of sleep assessed & Sleep Hygiene techniques promoted Reviewed mental health medications and discussed importance of compliance: Pt reports he will address med concerns with [facility] Counselor at upcoming appt Solution-Focued Strategies employed: Suicidal Ideation/Homicidal Ideation assessed: Pt denies  Patient Goals/Self-Care Activities:  Continue taking your medication as prescribed.   Increase coping skills and healthy habits  Follow up with PCP regarding referral to [facility]  Attend scheduled medical appts   Plan:   Telephone follow up appointment with care management team member scheduled for:  2-4 weeks        Please see education materials related to topics discussed provided by MyChart link.  Patient verbalizes understanding of instructions and care plan provided today and agrees to view in MyChart. Active MyChart status and patient understanding of how to access instructions and care plan via MyChart confirmed with patient.     Licensed Clinical Social Worker will 08/4 at 12:30 PM  [name], LCSW [place]  [facility] Clinical Social Worker Direct Dial: [phone]  Fax: [phone] Website: [email or web address] 12:44 PM   Following is a copy of your plan of care:  There are no care plans that you recently modified to display for this  patient.

## 2024-01-27 ENCOUNTER — Ambulatory Visit: Attending: Physician Assistant

## 2024-01-27 DIAGNOSIS — M6281 Muscle weakness (generalized): Secondary | ICD-10-CM | POA: Diagnosis present

## 2024-01-27 DIAGNOSIS — R2681 Unsteadiness on feet: Secondary | ICD-10-CM | POA: Insufficient documentation

## 2024-01-27 DIAGNOSIS — R278 Other lack of coordination: Secondary | ICD-10-CM | POA: Diagnosis present

## 2024-01-27 DIAGNOSIS — R2689 Other abnormalities of gait and mobility: Secondary | ICD-10-CM | POA: Insufficient documentation

## 2024-01-27 NOTE — Therapy (Signed)
 OUTPATIENT PHYSICAL THERAPY NEURO TREATMENT   Patient Name: Erik Santana. MRN: [id number] DOB:March 05, 1991, 33 y.o., male 2 Date: 01/27/2024   PCP: Greig Drones, NP REFERRING PROVIDER: Pegge Toribio PARAS, PA-C   END OF SESSION:  PT End of Session - 01/27/24 1443     Visit Number 17    Number of Visits 23    Date for PT Re-Evaluation 03/09/24    Authorization Type  Medicaid    PT Start Time 1443    PT Stop Time 1534    PT Time Calculation (min) 51 min    Equipment Utilized During Treatment Gait belt    Activity Tolerance Patient tolerated treatment well    Behavior During Therapy WFL for tasks assessed/performed          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   Past Surgical History:  Procedure Laterality Date   ESOPHAGOGASTRODUODENOSCOPY N/A 10/18/2012   Procedure: ESOPHAGOGASTRODUODENOSCOPY (EGD);  Surgeon: Lamar JONETTA Aho, MD;  Location: [facility];  Service: Endoscopy;  Laterality: N/A;   ESOPHAGOGASTRODUODENOSCOPY (EGD) WITH PROPOFOL  N/A 10/18/2012   Procedure: ESOPHAGOGASTRODUODENOSCOPY (EGD) WITH PROPOFOL ;  Surgeon: Lamar JONETTA Aho, MD;  Location: [facility];  Service: Endoscopy;  Laterality: N/A;   FINGER SURGERY     IR ANGIO INTRA EXTRACRAN SEL COM CAROTID INNOMINATE BILAT MOD SED  08/23/2023   IR ANGIO VERTEBRAL SEL SUBCLAVIAN INNOMINATE UNI L MOD SED  08/23/2023   IR ANGIO VERTEBRAL SEL VERTEBRAL UNI R MOD SED  08/23/2023   OPEN REDUCTION INTERNAL FIXATION (ORIF) PROXIMAL PHALANX Right 10/12/2022   Procedure: Right ring finger middle phalanx and proximal interphalangeal joint closed reduction internal percutaneous pinning;  Surgeon: Alyse Agent, MD;  Location:  SURGERY CENTER;  Service: Orthopedics;  Laterality: Right;   Patient Active Problem List   Diagnosis Date Noted   Seizure-like activity (HCC)  12/16/2023   PTSD (post-traumatic stress disorder) 12/07/2023   Left hemiparesis (HCC) 12/07/2023   Spasticity 12/07/2023   Emotional lability 10/05/2023   Left foot drop 10/05/2023   Cognitive change 08/30/2023   Intraparenchymal hematoma of brain (HCC) 08/24/2023   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   Anxiety state 10/27/2012   Hematemesis 10/18/2012   Other esophagitis 10/18/2012   Mallory-Weiss tear 10/18/2012    ONSET DATE: 09/22/2023 (referral date)  REFERRING DIAG: S06.33AA (ICD-10-CM) - Intraparenchymal hematoma of brain due to trauma [facility])  THERAPY DIAG:  Other abnormalities of gait and mobility  Muscle weakness (generalized)  Unsteadiness on feet  Other lack of coordination  Rationale for Evaluation and Treatment: Rehabilitation  SUBJECTIVE:  SUBJECTIVE STATEMENT: Patient arrives to clinic carrying Loftstrand and not wearing AFO. He has not worn his brace for ~3 days because he wants to learn how to walk to overpower his clonus. Wants to work on making his toes move. Denies falls. Did have Dr. Emeline place referral to the [facility] clinic.   Pt accompanied by: self and mother, [name]  PERTINENT HISTORY: parenchymal hemorrhage centered at the right frontal parietal junction, intraparenchymal hematoma within the superior right frontal lobe, seizures   PAIN:  Are you having pain? 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 Yes. 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   OBJECTIVE:  Note: Objective measures were completed at Evaluation unless otherwise noted.  DIAGNOSTIC FINDINGS:   MR 08/22/2023: IMPRESSION: Unchanged appearance of intraparenchymal hematoma within the superior right frontal lobe. No mass lesion.  COGNITION: Overall cognitive status: Within functional limits for tasks assessed   VITALS:  There were no vitals filed for this visit.                                                                                                   TREATMENT:   Theract:  [facility] PT Assessment - 01/27/24 0001       Standardized Balance Assessment   Five times sit to stand comments  13.13s no UE    10 Meter Walk .18m/s   no AD and no AFO       -demonstrated bowling toward 6 pins (cones) set on floor ~69ft away with 10lb med ball   -no loss of balance and patient self-reports near baseline follow thru  Estim attended: -NMES to dorsum of foot   -extensor hallucis longus and brevis with good results   -intermittent extensor tone turned on when patient would force movement -NMES to plantar surface of foot   -flexor hallucis longus and brevis with good results -mirror therapy with gaze toward unaffected LE while e-stim was applied to obscured effected side for greater visual feedback and neuro re-learning     PATIENT EDUCATION: Education details: continue HEP, goal assessment, where/how to purchase e-stim unit for home limiting to a total of 15 mins per day and being mindful of possible latex in pads Person educated: Patient and Parent Education method: Explanation and  Demonstration Education comprehension: verbalized understanding and needs further education  HOME EXERCISE PROGRAM: Access Code: [email or web address] URL: [email or web address] Date: 11/01/2023  Prepared by: Lauraine Grumbling  Exercises - Staggered Sit-to-Stand  - 1 x daily - 7 x weekly - 3 sets - 10 reps - Backward Walking with Counter Support  - 1 x daily - 7 x weekly - 5 sets  Access Code: [email or web address] URL: [email or web address] Date: 11/04/2023 Prepared by: Marlon Plaster  Exercises - Calf stretch  - 1 x daily - 7 x weekly - 60-90 seconds hold - Squat with Resistance at Thighs  - 1 x daily - 7 x weekly - 3 sets - 10 reps - Side Stepping with Resistance at Thighs  - 1 x daily - 7 x weekly - 3 sets - 10 reps  GOALS: Goals reviewed with patient? Yes   LONG TERM GOALS: Target date: 12/16/2023  Patient will report demonstrate independence with final HEP in order to maintain current gains and continue to progress after physical therapy discharge.   Baseline: To be provided  Goal status: IN PROGRESS   2.  Patient will improve gait speed to 0.5 m/s or greater to indicate an improvement from being a household ambulator to limited community ambulator.   Baseline: 0.34 m/s with R lofstrand crutch and L AFO (SBA); 0.7 m/s no AD or AFO  Goal status: MET  3.  Patient will improve their 5x Sit to Stand score to less than 15 seconds to demonstrate a decreased risk for falls and improved LE strength.   Baseline: 18.97 seconds without UE use; 15.81s no UE support  Goal status: PARTIALLY MET   4.  Patient will ambulate at least 351ft during with LRAD to demonstrate improved community ambulation tolerance Baseline: 267' SBA no AD + AFO; 413' mod I no AD or AFO  Goal status: MET  NEW SHORT TERM GOALS FOR EXTENDED POC:   Target date: 01/13/2024  Pt will improve 5 x STS to less than or equal to 12 seconds w/o UE support to demonstrate improved functional strength and transfer  efficiency.   Baseline: 15.81s no UE support (6/20); 13.13s no UE Goal status: NOT MET  2.  Pt will be able to bowl w/10# ball without LOB for improved functional mobility and return to sport  Baseline: Demonstrated such with no LOB Goal status: MET  3.  Pt will improve gait velocity to at least 0.9 m/s w/LRAD and AFO for improved gait efficiency and independence   Baseline: 0.7 m/s no AD or AFO; .45m/s Goal status: NOT MET   NEW LONG TERM GOALS FOR EXTENDED POC:  Target date: 02/10/2024  Pt will improve gait velocity to at least 1.1 m/s w/LRAD and AFO for improved gait efficiency and independence   Baseline: 0.7 m/s no AD or AFO Goal status: INITIAL  2.  to be assessed and LTG updated  Baseline:  Goal status: INITIAL  3.  Pt will tolerate 10 minutes of boxing w/BUEs for improved endurance, UE coordination and return to sport  Baseline:  Goal status: INITIAL  4.  Pt will be independent with final HEP for improved strength, balance, transfers and gait.  Baseline:  Goal status: IN PROGRESS   ASSESSMENT:  CLINICAL IMPRESSION: Patient seen for skilled PT session with emphasis on goal assessment and NMES to flexor/extensor hallucis longus/brevis to continue to progress toward improved balance. He met 1/3 STG and is improving exceptionally well toward remaining goals. Five times Sit to Stand Test (FTSS) Method: Use a straight back chair with a solid seat that is 17-18" high. Ask participant to sit on the chair  with arms folded across their chest.   Instructions: "Stand up and sit down as quickly as possible 5 times, keeping your arms folded across your chest."   Measurement: Stop timing when the participant touches the chair in sitting the 5th time.  TIME: 13.13 sec  Cut off scores indicative of increased fall risk: >12 sec CVA, >16 sec PD, >13 sec vestibular (ANPTA Core Set of Outcome Measures for Adults with Neurologic Conditions, 2018). 10 Meter Walk Test: Patient  instructed to walk 10 meters (32.8 ft) as quickly and as safely as possible at their normal speed x2 and at a fast speed x2. Time measured from 2 meter mark to 8 meter mark to accommodate ramp-up and ramp-down.  Normal speed: .18m/s Cut off scores: <0.4 m/s = household Ambulator, 0.4-0.8 m/s = limited community Ambulator, >0.8 m/s = community Ambulator, >1.2 m/s = crossing a street, <1.0 = increased fall risk MCID 0.05 m/s (small), 0.13 m/s (moderate), 0.06 m/s (significant)  (ANPTA Core Set of Outcome Measures for Adults with Neurologic Conditions, 2018). His PLOF was reflective of someone very active in the community where his current level of function is not. He demonstrated a very good response to NMES targeting L great toe. There is substantial research supporting great toe strength and improved balance/reduced risk for falls. He continues to benefit from skilled PT services to address the above mentioned deficits and allow him to progress toward his PLOF.     OBJECTIVE IMPAIRMENTS: Abnormal gait, decreased activity tolerance, decreased balance, decreased endurance, decreased mobility, difficulty walking, decreased ROM, decreased strength, increased fascial restrictions, impaired flexibility, impaired sensation, impaired tone, and pain.   ACTIVITY LIMITATIONS: carrying, standing, transfers, and locomotion level  PARTICIPATION LIMITATIONS: driving, community activity, occupation, and yard work  PERSONAL FACTORS: Age, Time since onset of injury/illness/exacerbation, Transportation, and 1-2 comorbidities: see above are also affecting patient's functional outcome.   REHAB POTENTIAL: Good  CLINICAL DECISION MAKING: Evolving/moderate complexity  EVALUATION COMPLEXITY: Moderate  PLAN:  PT FREQUENCY/DURATION: 1x/week for 12 weeks (recert)   PLANNED INTERVENTIONS: 02835- PT Re-evaluation, 97110-Therapeutic exercises, 97530- Therapeutic activity, [id number]- Neuromuscular re-education, 97535- Self  Care, 02859- Manual therapy, [id number]- Gait training, [id number]- Orthotic/Prosthetic subsequent, and [phone]- Aquatic Therapy  PLAN FOR NEXT SESSION: submitted auth for 1x a week for 6 weeks to fill gap of remaining visits (17/23 completed). Did we get auth approved to schedule the visits on 8/8 and 8/15? + LTG, provide him with LYB info (interested in doing yoga)  Work on sports Advice worker - basketball dribbling, return to bowling, boxing, wants to return to running as able   stretching program with emphasis on hamstring tightness, quad strengthening as able, high intensity gait training, continue conversation about additional botox (recommended vs not pending gait), L NMR re-ed   For all possible CPT codes, reference the Planned Interventions line above.     Check all conditions that are expected to impact treatment: {Conditions expected to impact treatment:Contractures, spasticity or fracture relevant to requested treatment and Neurological condition and/or seizures   If treatment provided at initial evaluation, no treatment charged due to lack of authorization.      Delon DELENA Pop, PT Delon DELENA Pop, PT, DPT, CBIS   01/27/2024, 3:56 PM

## 2024-01-30 ENCOUNTER — Other Ambulatory Visit: Payer: Self-pay | Admitting: Licensed Clinical Social Worker

## 2024-01-31 NOTE — Patient Outreach (Signed)
 Complex Care Management   Visit Note  01/30/2024  Name:  Erik Santana. MRN: [id number] DOB: [birth year]  Situation: Referral received for Complex Care Management related to Mental/Behavioral Health diagnosis Anxiety I obtained verbal consent from Patient.  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    Assessment: Patient Reported Symptoms:  Cognitive Cognitive Status: Alert and oriented to person, place, and time, Normal speech and language skills Cognitive/Intellectual Conditions Management [RPT]: Brain Injury      Neurological Neurological Review of Symptoms: No symptoms reported    HEENT HEENT Symptoms Reported: Not assessed      Cardiovascular Cardiovascular Symptoms Reported: Not assessed    Respiratory Respiratory Symptoms Reported: Not assesed    Endocrine Endocrine Symptoms Reported: Not assessed    Gastrointestinal Gastrointestinal Symptoms Reported: Not assessed      Genitourinary Genitourinary Symptoms Reported: Not assessed    Integumentary Integumentary Symptoms Reported: Not assessed    Musculoskeletal Musculoskelatal Symptoms Reviewed: No symptoms reported        Psychosocial Psychosocial Symptoms Reported: Anxiety - if selected complete GAD Additional Psychological Details: Pt reports slight decrease in symptoms. Has good insight of grounding strategies to assist with management of symptoms Behavioral Management Strategies: Support system, Medication therapy, Coping strategies Major Change/Loss/Stressor/Fears (CP): Medical condition, self        01/04/2024   11:39 AM  Depression screen PHQ 2/9  Decreased Interest   Down, Depressed, Hopeless   PHQ - 2 Score   Altered sleeping   Tired, decreased energy   Change in appetite   Feeling bad or failure about yourself     Trouble concentrating   Moving slowly or fidgety/restless   Suicidal thoughts   PHQ-9 Score   Difficult doing work/chores      Information is confidential and restricted. Go to Review Flowsheets to unlock data.    There were no vitals filed for this visit.  Medications Reviewed Today     Reviewed by Chisom Muntean D, LCSW (Social Worker) on 01/31/24 at [phone]  Med List Status: <None>   Medication Order Taking? Sig Documenting Provider Last Dose Status Informant  acetaminophen  (TYLENOL ) 325 MG tablet [id number]  Take 1-2 tablets (325-650 mg total) by mouth every 6 (six) hours as needed for mild pain (pain score 1-3), headache or moderate pain (pain score 4-6) (325 mg for pain 1-3, 6500 mg for pain 4-6).  Patient not taking: Reported on 12/21/2023   Pegge Toribio PARAS, PA-C  Active Self, Pharmacy Records  atorvastatin  (LIPITOR) 20 MG tablet [id number]  Take 1 tablet (20 mg total) by mouth daily. Lorren Greig PARAS, NP  Active   busPIRone  (BUSPAR ) 10 MG tablet [id number]  Take 2 tablets (20 mg total) by mouth 3 (three) times daily. Emeline Joesph BROCKS, DO  Active   levETIRAcetam  (KEPPRA ) 750 MG tablet [id number]  Take 1 tablet (750 mg total) by mouth 2 (two) times daily. Take one 500 mg tablet and one 250 mg tablet together for a total of 750 mg, twice a day Penumalli, Vikram R, MD  Active   lidocaine  (LIDODERM ) 5 % [id number]  Place 1 patch onto the skin every 12 (twelve) hours as needed. Remove & Discard patch within 12 hours or as directed by MD  Patient not taking: Reported on 12/21/2023  Emeline Joesph BROCKS, DO  Active Self, Pharmacy Records  loratadine  (CLARITIN ) 10 MG tablet [id number]  Take 1 tablet (10 mg total) by mouth daily. Lorren Greig PARAS, NP  Active   meclizine  (ANTIVERT ) 25 MG tablet [id number]  Take 1 tablet (25 mg total) by mouth 3 (three) times daily as needed for dizziness. Emeline Joesph BROCKS, DO  Active Self, Pharmacy Records  prazosin  (MINIPRESS ) 1 MG capsule [id number]  Take 1 capsule (1  mg total) by mouth at bedtime. Emeline Joesph BROCKS, DO  Active   QUEtiapine  (SEROQUEL ) 25 MG tablet [id number]  Take 1 tablet (25 mg total) by mouth 2 (two) times daily. Emeline Joesph BROCKS, DO  Active   tiZANidine  (ZANAFLEX ) 4 MG tablet [id number]  Take 1 tablet (4 mg total) by mouth 3 (three) times daily. May also take 1 tablet (4 mg total) daily as needed (Anxiety, clonus, spasms). Emeline Joesph C, DO  Active   Vitamin D , Ergocalciferol , (DRISDOL ) 1.25 MG (50000 UNIT) CAPS capsule [id number]  Take 1 capsule (50,000 Units total) by mouth every 7 (seven) days for 12 doses. Lorren Greig PARAS, NP  Active             Recommendation:   Continue Current Plan of Care  Follow Up Plan:   Telephone follow-up 2-4 weeks  [name], LCSW [facility]  [facility], [facility] Clinical Social Worker Direct Dial: [phone]  Fax: [phone] Website: [email or web address] 8:29 AM

## 2024-01-31 NOTE — Patient Instructions (Signed)
 Visit Information  Mr. Courtwright was given information about Medicaid Managed Care team care coordination services as a part of their [facility] Medicaid benefit.   If you would like to schedule transportation through your [facility] plan, please call the following number at least 2 days in advance of your appointment: [phone].   You can also use the MTM portal or MTM mobile app to manage your rides. Reimbursement for transportation is available through [facility]! For the portal, please go to [email or web address]  Call the [facility] Crisis Line at [phone], at any time, 24 hours a day, 7 days a week. If you are in danger or need immediate medical attention call 911.   Mr. Largo - following are the goals we discussed in your visit today:   Goals Addressed             This Visit's Progress    LCSW VBCI Social Work Care Plan   On track    Problems:   Disease Management support and education needs related to Anxiety with Panic Symptoms, and PTSD  CSW Clinical Goal(s):   Over the next 90 days the Patient will attend all scheduled medical appointments as evidenced by patient report and care team review of appointment completion in electronic MEDICAL RECORD NUMBER  demonstrate a reduction in symptoms related to Anxiety with Panic Symptoms, PTSD .  Interventions:  Mental Health:  Evaluation of current treatment plan related to Anxiety with Panic Symptoms, and PTSD Active listening / Reflection utilized Financial risk analyst / information provided Emotional Support Provided Mindfulness or Relaxation training provided Provided general psycho-education for mental health needs Quality of sleep assessed & Sleep Hygiene techniques promoted Reviewed mental health medications and discussed importance of compliance: Pt reports he will address med concerns with [facility] Counselor at upcoming appt Solution-Focued Strategies employed: Suicidal Ideation/Homicidal Ideation assessed: Pt  denies  Patient Goals/Self-Care Activities:  Continue taking your medication as prescribed.   Increase coping skills and healthy habits  Follow up with PCP regarding referral to [facility]  Attend scheduled medical appts   Plan:   Telephone follow up appointment with care management team member scheduled for:  2-4 weeks        Please see education materials related to topics discussed provided by MyChart link.  Patient verbalizes understanding of instructions and care plan provided today and agrees to view in MyChart. Active MyChart status and patient understanding of how to access instructions and care plan via MyChart confirmed with patient.     Licensed Clinical Social Worker will call you on 09/02 at 2 PM  [name], LCSW [place]  [facility], [facility] Clinical Social Worker Direct Dial: [phone]  Fax: [phone] Website: [email or web address] 8:31 AM   Following is a copy of your plan of care:  There are no care plans that you recently modified to display for this patient.

## 2024-01-31 NOTE — Addendum Note (Signed)
 Addended by: EMELINE SEARCH on: 01/31/2024 03:15 PM   Modules accepted: Orders

## 2024-01-31 NOTE — Telephone Encounter (Signed)
 Patient called back requesting PT referral to [facility] in [place]; order placed.

## 2024-02-08 ENCOUNTER — Encounter: Attending: Physical Medicine and Rehabilitation | Admitting: Physical Medicine and Rehabilitation

## 2024-02-08 ENCOUNTER — Other Ambulatory Visit: Payer: Self-pay

## 2024-02-08 ENCOUNTER — Encounter: Payer: Self-pay | Admitting: Physical Medicine and Rehabilitation

## 2024-02-08 VITALS — BP 132/80 | HR 96 | Ht 75.0 in | Wt 215.0 lb

## 2024-02-08 DIAGNOSIS — I629 Nontraumatic intracranial hemorrhage, unspecified: Secondary | ICD-10-CM | POA: Insufficient documentation

## 2024-02-08 DIAGNOSIS — R252 Cramp and spasm: Secondary | ICD-10-CM | POA: Insufficient documentation

## 2024-02-08 DIAGNOSIS — R4586 Emotional lability: Secondary | ICD-10-CM | POA: Insufficient documentation

## 2024-02-08 DIAGNOSIS — K219 Gastro-esophageal reflux disease without esophagitis: Secondary | ICD-10-CM | POA: Diagnosis not present

## 2024-02-08 DIAGNOSIS — G8194 Hemiplegia, unspecified affecting left nondominant side: Secondary | ICD-10-CM | POA: Insufficient documentation

## 2024-02-08 DIAGNOSIS — F411 Generalized anxiety disorder: Secondary | ICD-10-CM | POA: Diagnosis present

## 2024-02-08 MED ORDER — DIAZEPAM 2 MG PO TABS: 1.0000 mg | ORAL_TABLET | Freq: Two times a day (BID) | ORAL | 2 refills | Status: DC | PRN

## 2024-02-08 MED ORDER — QUETIAPINE FUMARATE 25 MG PO TABS: 25.0000 mg | ORAL_TABLET | Freq: Every evening | ORAL | 0 refills | Status: DC | PRN

## 2024-02-08 MED ORDER — BUSPIRONE HCL 15 MG PO TABS: 15.0000 mg | ORAL_TABLET | Freq: Three times a day (TID) | ORAL | 5 refills | Status: DC

## 2024-02-08 MED ORDER — TRAZODONE HCL 50 MG PO TABS: 50.0000 mg | ORAL_TABLET | Freq: Every day | ORAL | 1 refills | Status: AC

## 2024-02-08 MED ORDER — PANTOPRAZOLE SODIUM 20 MG PO TBEC: 20.0000 mg | DELAYED_RELEASE_TABLET | Freq: Every day | ORAL | 1 refills | Status: DC

## 2024-02-08 NOTE — Progress Notes (Unsigned)
 Subjective:    Patient ID: Erik Buren Raddle., male    DOB: May 23, 1991, 33 y.o.   MRN: [id number]  HPI   Erik Trudo. is a 33 y.o. 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   They are presenting to PM&R clinic for follow up related to *** .  Plan from last visit: ***   Interval Hx:  - Therapies: Continuing PT; doing much better. 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    - Follow ups: Lost with psych; would no longer    - Falls: none   - DME: Anxiety remains debilitating; when I get around different people or outside of my home, my anxiety kicks in and it makes my ankle curve in; he can walk around at home without the brace, but in public he needs it because of this.    - Medications:  Buspar   Patient states the diazepam  controlled his clonus much better; he realizes his seizures were more anxiety related and is trying hard to manage this better.    - Other concerns: Mom notices patient has trouble with contructive criticism;    Pain Inventory Average Pain 3 Pain Right Now 4 My pain is intermittent and tension, tight  In the last 24 hours, has pain interfered with the following? General activity 5 Relation with others 7 Enjoyment of life 6 What TIME of day is your pain at its worst? evening Sleep (in general) Fair  Pain is worse with: walking and some activites Pain improves with: rest Relief from Meds: 4  Family History  Problem Relation Age of Onset   Healthy Mother    Healthy Father    Social History   Socioeconomic History   Marital status: Single    Spouse name: Not on file   Number of children: Not on file   Years of education: Not on file   Highest education level: Not on file  Occupational History   Not on file   Tobacco Use   Smoking status: Former    Current packs/day: 0.50    Types: Cigarettes   Smokeless tobacco: Never  Vaping Use   Vaping status: Former  Substance and Sexual Activity   Alcohol use: Not Currently    Comment: occ   Drug use: No   Sexual activity: Not on file  Other Topics Concern   Not on file  Social History Narrative   Not on file   Social Drivers of Health   Financial Resource Strain: Medium Risk (11/29/2023)   Overall Financial Resource Strain (CARDIA)    Difficulty of Paying Living Expenses: Somewhat hard  Food Insecurity: No Food Insecurity (02/08/2024)   Hunger Vital Sign    Worried About Running Out of Food in the Last Year: Never true    Ran Out of Food in the Last Year: Never true  Recent Concern: Food Insecurity - Food Insecurity Present (12/22/2023)   Hunger Vital Sign    Worried About Running Out of Food in the Last Year: Sometimes true    Ran Out of Food in the Last Year: Sometimes true  Transportation Needs: Unmet Transportation Needs (02/08/2024)   PRAPARE - Administrator, Civil Service (Medical): Yes    Lack of Transportation (Non-Medical): Yes  Physical Activity:  Sufficiently Active (11/09/2023)   Exercise Vital Sign    Days of Exercise per Week: 5 days    Minutes of Exercise per Session: 30 min  Stress: No Stress Concern Present (11/09/2023)   [employer] of Occupational Health - Occupational Stress Questionnaire    Feeling of Stress : Only a little  Social Connections: Socially Isolated (11/09/2023)   Social Connection and Isolation Panel    Frequency of Communication with Friends and Family: More than three times a week    Frequency of Social Gatherings with Friends and Family: More than three times a week    Attends Religious Services: Never    Database administrator or Organizations: No    Attends Banker Meetings: Never    Marital Status: Never married   Past Surgical History:  Procedure Laterality Date    ESOPHAGOGASTRODUODENOSCOPY N/A 10/18/2012   Procedure: ESOPHAGOGASTRODUODENOSCOPY (EGD);  Surgeon: Lamar JONETTA Aho, MD;  Location: [facility];  Service: Endoscopy;  Laterality: N/A;   ESOPHAGOGASTRODUODENOSCOPY (EGD) WITH PROPOFOL  N/A 10/18/2012   Procedure: ESOPHAGOGASTRODUODENOSCOPY (EGD) WITH PROPOFOL ;  Surgeon: Lamar JONETTA Aho, MD;  Location: [facility];  Service: Endoscopy;  Laterality: N/A;   FINGER SURGERY     IR ANGIO INTRA EXTRACRAN SEL COM CAROTID INNOMINATE BILAT MOD SED  08/23/2023   IR ANGIO VERTEBRAL SEL SUBCLAVIAN INNOMINATE UNI L MOD SED  08/23/2023   IR ANGIO VERTEBRAL SEL VERTEBRAL UNI R MOD SED  08/23/2023   OPEN REDUCTION INTERNAL FIXATION (ORIF) PROXIMAL PHALANX Right 10/12/2022   Procedure: Right ring finger middle phalanx and proximal interphalangeal joint closed reduction internal percutaneous pinning;  Surgeon: Alyse Agent, MD;  Location: [facility];  Service: Orthopedics;  Laterality: Right;   Past Surgical History:  Procedure Laterality Date   ESOPHAGOGASTRODUODENOSCOPY N/A 10/18/2012   Procedure: ESOPHAGOGASTRODUODENOSCOPY (EGD);  Surgeon: Lamar JONETTA Aho, MD;  Location: [facility];  Service: Endoscopy;  Laterality: N/A;   ESOPHAGOGASTRODUODENOSCOPY (EGD) WITH PROPOFOL  N/A 10/18/2012   Procedure: ESOPHAGOGASTRODUODENOSCOPY (EGD) WITH PROPOFOL ;  Surgeon: Lamar JONETTA Aho, MD;  Location: [facility];  Service: Endoscopy;  Laterality: N/A;   FINGER SURGERY     IR ANGIO INTRA EXTRACRAN SEL COM CAROTID INNOMINATE BILAT MOD SED  08/23/2023   IR ANGIO VERTEBRAL SEL SUBCLAVIAN INNOMINATE UNI L MOD SED  08/23/2023   IR ANGIO VERTEBRAL SEL VERTEBRAL UNI R MOD SED  08/23/2023   OPEN REDUCTION INTERNAL FIXATION (ORIF) PROXIMAL PHALANX Right 10/12/2022   Procedure: Right ring finger middle phalanx and proximal interphalangeal joint closed reduction internal percutaneous pinning;  Surgeon: Alyse Agent, MD;  Location: [facility];  Service: Orthopedics;   Laterality: Right;   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   BP (!) 139/92   Pulse 96   Ht 6' 3 (1.905 m)   Wt 215 lb (97.5 kg)   SpO2 97%   BMI 26.87 kg/m   Opioid Risk Score:   Fall Risk Score:  `1  Depression screen [facility] 2/9     02/08/2024    3:47 PM 01/04/2024   11:39 AM 12/23/2023    1:45 PM 12/07/2023    2:18 PM 11/09/2023   10:07 AM 10/05/2023   12:09 PM  Depression screen PHQ 2/9  Decreased Interest 0  0 1 2 1   Down, Depressed, Hopeless 0  1 1 1  2  PHQ - 2 Score 0  1 2 3 3   Altered sleeping   1  2 2   Tired, decreased energy   1  1 1   Change in appetite   1  1 2   Feeling bad or failure about yourself    1  2 1   Trouble concentrating   1  1 1   Moving slowly or fidgety/restless   1  1 2   Suicidal thoughts   0  1 1  PHQ-9 Score   7  12 13   Difficult doing work/chores   Somewhat difficult  Somewhat difficult Somewhat difficult     Information is confidential and restricted. Go to Review Flowsheets to unlock data.    Review of Systems  Musculoskeletal:        Left foot & ankle pain  All other systems reviewed and are negative.      Objective:   Physical Exam   Inversion, plantarflexion tone MAS 2     Assessment & Plan:

## 2024-02-08 NOTE — Patient Outreach (Signed)
 Complex Care Management   Visit Note  02/08/2024  Name:  Erik Santana. MRN: [id number] DOB: 06-24-1991  Situation: Referral received for Complex Care Management related to SDOH Barriers:  Food insecurity Need to apply for SSDI I obtained verbal consent from Patient.  Visit completed with patient  on the phone  Background:   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    Assessment: BSW held f/u appointment with pt. Pt was alert and cognitive. Pt reports that he was suppose to meet up in-person with CM Pam from [facility] at [employer]. However, in-person appt was cancelled by [facility] last minute and he provided information over the phone. Pt was told all they needed from him was his signature to submit SSDI application. Patient plans to reach out to CM [facility] today since he has an appt in [place] and try to coordinate an in-person meeting to provide signature. Pt states he is doing better with food insecurity and budgeting his FNS benefit.   Pt states he has been 2 weeks w/o PT due to an insurance issue and is waiting for that to resolve itself. BSW offered pt medicaid transportation resource through [facility] but patient states he prefers to work with his mother for transportation since that works and stepping out of his routine can cause him stress and anxiety. BSW encouraged patient to continue LCSW meetings with Jasmine. No other resources were provided/requested at this time.    SDOH Interventions    Flowsheet Row Patient Outreach Telephone from 02/08/2024 in [place] POPULATION HEALTH DEPARTMENT Patient Outreach from 12/22/2023 in [place] POPULATION HEALTH DEPARTMENT Patient Outreach Telephone from 11/29/2023 in [place] POPULATION HEALTH DEPARTMENT Office Visit from 11/09/2023 in [facility] at [facility]  SDOH Interventions      Food Insecurity  Interventions Intervention Not Indicated Community Resources Provided [employer] Provided Intervention Not Indicated, AMB Referral  Housing Interventions Intervention Not Indicated Intervention Not Indicated Intervention Not Indicated Intervention Not Indicated  Transportation Interventions Payor Benefit Intervention Not Indicated Intervention Not Indicated Intervention Not Indicated  Utilities Interventions Intervention Not Indicated Community Resources Provided  [employer] - additional benefits - house/utility allowance.] Intervention Not Indicated Intervention Not Indicated  Alcohol Usage Interventions -- -- -- Intervention Not Indicated (Score <7)  Financial Strain Interventions -- -- Programmer, applications Provided Intervention Not Indicated  Physical Activity Interventions -- -- -- Intervention Not Indicated  Stress Interventions -- -- -- Intervention Not Indicated  Social Connections Interventions -- -- -- Intervention Not Indicated  Health Literacy Interventions -- -- -- Intervention Not Indicated      Recommendation:   none  Follow Up Plan:   Telephone follow up appointment date/time:  02/22/2024 at 1:30pm  [name], BSW [place]/VBCI - [employer] Social Worker [phone]

## 2024-02-08 NOTE — Patient Instructions (Addendum)
 Reduce buspar  to 15 mg due to better symptom control with the lower dose   For sleep and depression/anxiety, start trazodone  50 mg at nighttime Use seroquel  25 mg as needed in addition for sleep only.  Stop prazosin  due to worsening dreams, restlessness.  Resume valium  for spasms, anxiety, up to twice daily; use tizanidine  for back-up only. I will not refill tizanidine .  I will manage your psych medications and work on getting you a new therapist.   I will reach back out to your disability specialist about paperwork needs for application.   Follow up with me in September for botox to the left leg

## 2024-02-08 NOTE — Patient Instructions (Signed)
 Visit Information  Erik Santana was given information about Medicaid Managed Care team care coordination services as a part of their [facility] Medicaid benefit.   If you would like to schedule transportation through your [facility] plan, please call the following number at least 2 days in advance of your appointment: [phone].   You can also use the MTM portal or MTM mobile app to manage your rides. Reimbursement for transportation is available through [facility]! For the portal, please go to [email or web address]  Call the [facility] Crisis Line at [phone], at any time, 24 hours a day, 7 days a week. If you are in danger or need immediate medical attention call 911.   Erik Santana - following are the goals we discussed in your visit today:   Goals Addressed   None     Patient verbalizes understanding of instructions and care plan provided today and agrees to view in MyChart. Active MyChart status and patient understanding of how to access instructions and care plan via MyChart confirmed with patient.     Telephone follow up appointment with Managed Medicaid care management team member scheduled for: 02/22/2024 at 1:30pm  [name], BSW [place]/VBCI - [facility] Social Worker [phone]   Following is a copy of your plan of care:  There are no care plans that you recently modified to display for this patient.

## 2024-02-17 ENCOUNTER — Encounter (HOSPITAL_COMMUNITY): Payer: Self-pay | Admitting: Physician Assistant

## 2024-02-17 ENCOUNTER — Telehealth (HOSPITAL_COMMUNITY): Admitting: Physician Assistant

## 2024-02-17 DIAGNOSIS — F411 Generalized anxiety disorder: Secondary | ICD-10-CM | POA: Diagnosis not present

## 2024-02-17 DIAGNOSIS — F4310 Post-traumatic stress disorder, unspecified: Secondary | ICD-10-CM

## 2024-02-17 DIAGNOSIS — F4323 Adjustment disorder with mixed anxiety and depressed mood: Secondary | ICD-10-CM

## 2024-02-17 NOTE — Progress Notes (Signed)
 BH MD/PA/NP OP Progress Note  Virtual Visit via Video Note  I connected with Erik Buren Raddle. on 02/17/24 at  1:30 PM EDT by a video enabled telemedicine application and verified that I am speaking with the correct person using two identifiers.  Location: Patient: Home Provider: Clinic   I discussed the limitations of evaluation and management by telemedicine and the availability of in person appointments. The patient expressed understanding and agreed to proceed.  Follow Up Instructions:  I discussed the assessment and treatment plan with the patient. The patient was provided an opportunity to ask questions and all were answered. The patient agreed with the plan and demonstrated an understanding of the instructions.   The patient was advised to call back or seek an in-person evaluation if the symptoms worsen or if the condition fails to improve as anticipated.  I provided 28 minutes of non-face-to-face time during this encounter.  Reginia FORBES Bolster, PA    02/17/2024 6:31 PM Erik Buren Raddle.  MRN:  [id number]  Chief Complaint:  Chief Complaint  Patient presents with   Follow-up   HPI:   Erik Santana is a 33 year old male with a past psychiatric history significant for PTSD, generalized anxiety disorder/panic attacks, and adjustment disorder with mixed anxiety and depressed mood who presents to [facility] for follow-up.  During his last encounter with this provider, patient was being managed on the following psychiatric medications:  Valium  2 mg 2 times daily as needed (temporary prescription) Lexapro  5 mg daily Buspirone  15 mg 3 times daily  Patient reports that he discontinued taking his Lexapro  stating that he experienced a variety of side effects from the medication.  Patient endorses the following side effects experienced from his Lexapro  use: constipation, night terrors, esophageal tightening, and random erections.   Patient states that he took the medication for 4 days before discontinuing it due to the side effects.  When he discontinued the medication, patient reports that he also experienced bouts of diarrhea.  Patient reports that he went a month without any medications except for his muscle relaxer and buspirone .  During that time, patient reports that he would experience panic attacks especially when going out in public.  Patient states that he ended up reaching out to his physical medicine and rehabilitation provider about his issues.  Patient reports that he was eventually placed on Valium  as needed for the management of his panic attacks/seizure like activities.  When taking Valium , patient reports that the medication goes into effect 30 minutes to an hour.  Patient reports that Valium  typically last 2 to 4 hours at a time.  He reports that he does not take the medication every day and only takes it whenever his anxiety is elevated or when he has panic attacks.  Patient denies experiencing adverse side effects through the use of his Valium .  Patient denies overt depression; however, he does endorse intrusive thoughts related to his past.  In addition to taking Valium  and his other medications, patient reports that he has been walking daily to help improve his mood.  Patient states that he still continues to experience episodes of brain fog and seizure-like episodes.  Patient reports that he is being closely monitored by his physical medicine and rehabilitation provider while he is on Valium .  A PHQ-9 screen was performed with the patient scoring an 8.  A GAD-7 screen was also performed with the patient scoring a 14.  Patient is alert and oriented x  4, calm, cooperative, and fully engaged in conversation during the encounter.  Patient describes his mood as even keeled.  Patient exhibits euthymic mood with appropriate affect.  Patient denies suicidal or homicidal ideations.  He further denies auditory or visual  hallucinations and does not appear to be responding to internal/external stimuli.  Patient endorses good sleep and receives on average 8 to 10 hours of sleep per night.  Patient endorses good appetite and eats on average 2-3 meals per day.  Patient denies alcohol consumption, tobacco use, or illicit drug use.  Visit Diagnosis:    ICD-10-CM   1. PTSD (post-traumatic stress disorder)  F43.10     2. Generalized anxiety disorder  F41.1     3. Adjustment disorder with mixed anxiety and depressed mood  F43.23       Past Psychiatric History:  Patient has a past psychiatric history significant for anxiety and ADD. - Patient is currently being treated for PTSD, Generalized anxiety disorder, and adjustment disorder (with mixed anxiety and depressed mood).   Patient denies a past history of hospitalization due to mental health.   Patient denies a past history of suicide attempt.   Patient denies a past history of homicide attempt.  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    Past Surgical History:  Procedure Laterality Date   ESOPHAGOGASTRODUODENOSCOPY N/A 10/18/2012   Procedure: ESOPHAGOGASTRODUODENOSCOPY (EGD);  Surgeon: Lamar JONETTA Aho, MD;  Location: [facility];  Service: Endoscopy;  Laterality: N/A;   ESOPHAGOGASTRODUODENOSCOPY (EGD) WITH PROPOFOL  N/A 10/18/2012   Procedure: ESOPHAGOGASTRODUODENOSCOPY (EGD) WITH PROPOFOL ;  Surgeon: Lamar JONETTA Aho, MD;  Location: [facility];  Service: Endoscopy;  Laterality: N/A;   FINGER SURGERY     IR ANGIO INTRA EXTRACRAN SEL COM CAROTID INNOMINATE BILAT MOD SED  08/23/2023   IR ANGIO VERTEBRAL SEL SUBCLAVIAN INNOMINATE UNI L MOD SED  08/23/2023   IR ANGIO VERTEBRAL SEL VERTEBRAL UNI R MOD SED  08/23/2023   OPEN REDUCTION INTERNAL FIXATION (ORIF) PROXIMAL PHALANX Right 10/12/2022   Procedure: Right  ring finger middle phalanx and proximal interphalangeal joint closed reduction internal percutaneous pinning;  Surgeon: Alyse Agent, MD;  Location: [facility];  Service: Orthopedics;  Laterality: Right;    Family Psychiatric History:  Patient reports that anxiety and depression runs in the family. Mother - anxiety and depression Grandmother (maternal) - anxiety and depression Sister - anxiety   Family history of suicide attempt: Patient denies Family history of homicide attempt: Patient denies Family history of substance abuse: Patient reports that his uncle (maternal) abused substances.  Family History:  Family History  Problem Relation Age of Onset   Healthy Mother    Healthy Father     Social History:  Social History   Socioeconomic History   Marital status: Single    Spouse name: Not on file   Number of children: Not on file   Years of education: Not on file   Highest education level: Not on file  Occupational History   Not on file  Tobacco Use   Smoking status: Former    Current packs/day: 0.50    Types: Cigarettes   Smokeless tobacco: Never  Vaping Use   Vaping status: Former  Substance and Sexual Activity   Alcohol use: Not Currently    Comment: occ   Drug use: No  Sexual activity: Not on file  Other Topics Concern   Not on file  Social History Narrative   Not on file   Social Drivers of Health   Financial Resource Strain: Medium Risk (11/29/2023)   Overall Financial Resource Strain (CARDIA)    Difficulty of Paying Living Expenses: Somewhat hard  Food Insecurity: No Food Insecurity (02/08/2024)   Hunger Vital Sign    Worried About Running Out of Food in the Last Year: Never true    Ran Out of Food in the Last Year: Never true  Recent Concern: Food Insecurity - Food Insecurity Present (12/22/2023)   Hunger Vital Sign    Worried About Running Out of Food in the Last Year: Sometimes true    Ran Out of Food in the Last Year: Sometimes true   Transportation Needs: Unmet Transportation Needs (02/08/2024)   PRAPARE - Administrator, Civil Service (Medical): Yes    Lack of Transportation (Non-Medical): Yes  Physical Activity: Sufficiently Active (11/09/2023)   Exercise Vital Sign    Days of Exercise per Week: 5 days    Minutes of Exercise per Session: 30 min  Stress: No Stress Concern Present (11/09/2023)   [employer] of Occupational Health - Occupational Stress Questionnaire    Feeling of Stress : Only a little  Social Connections: Socially Isolated (11/09/2023)   Social Connection and Isolation Panel    Frequency of Communication with Friends and Family: More than three times a week    Frequency of Social Gatherings with Friends and Family: More than three times a week    Attends Religious Services: Never    Database administrator or Organizations: No    Attends Banker Meetings: Never    Marital Status: Never married    Allergies:  Allergies  Allergen Reactions   Latex Hives   Nsaids Other (See Comments)    History of a Mallory-Weiss tear (had surgery to fix this 10+ years ago)    Metabolic Disorder Labs: Lab Results  Component Value Date   HGBA1C 5.0 12/23/2023   MPG 105.41 08/22/2023   No results found for: PROLACTIN Lab Results  Component Value Date   CHOL 271 (H) 12/23/2023   TRIG 683 (HH) 12/23/2023   HDL 35 (L) 12/23/2023   CHOLHDL 7.7 (H) 12/23/2023   VLDL 18 08/22/2023   LDLCALC 114 (H) 12/23/2023   LDLCALC 119 (H) 08/22/2023   Lab Results  Component Value Date   TSH 1.380 12/23/2023    Therapeutic Level Labs: No results found for: LITHIUM No results found for: VALPROATE No results found for: CBMZ  Current Medications: Current Outpatient Medications  Medication Sig Dispense Refill   acetaminophen  (TYLENOL ) 325 MG tablet Take 1-2 tablets (325-650 mg total) by mouth every 6 (six) hours as needed for mild pain (pain score 1-3), headache or moderate pain  (pain score 4-6) (325 mg for pain 1-3, 6500 mg for pain 4-6).     atorvastatin  (LIPITOR) 20 MG tablet Take 1 tablet (20 mg total) by mouth daily. 90 tablet 0   busPIRone  (BUSPAR ) 15 MG tablet Take 1 tablet (15 mg total) by mouth 3 (three) times daily. 60 tablet 5   diazepam  (VALIUM ) 2 MG tablet Take 0.5-1 tablets (1-2 mg total) by mouth every 12 (twelve) hours as needed for anxiety or muscle spasms. 60 tablet 2   levETIRAcetam  (KEPPRA ) 750 MG tablet Take 1 tablet (750 mg total) by mouth 2 (two) times daily. Take one 500 mg  tablet and one 250 mg tablet together for a total of 750 mg, twice a day 180 tablet 4   loratadine  (CLARITIN ) 10 MG tablet Take 1 tablet (10 mg total) by mouth daily. 90 tablet 0   meclizine  (ANTIVERT ) 25 MG tablet Take 1 tablet (25 mg total) by mouth 3 (three) times daily as needed for dizziness. 30 tablet 3   pantoprazole  (PROTONIX ) 20 MG tablet Take 1 tablet (20 mg total) by mouth daily. 90 tablet 1   QUEtiapine  (SEROQUEL ) 25 MG tablet Take 1 tablet (25 mg total) by mouth at bedtime as needed (sleep). 60 tablet 0   traZODone  (DESYREL ) 50 MG tablet Take 1 tablet (50 mg total) by mouth at bedtime. 90 tablet 1   Vitamin D , Ergocalciferol , (DRISDOL ) 1.25 MG (50000 UNIT) CAPS capsule Take 1 capsule (50,000 Units total) by mouth every 7 (seven) days for 12 doses. 12 capsule 0   No current facility-administered medications for this visit.     Musculoskeletal: Strength & Muscle Tone: within normal limits Gait & Station: normal Patient leans: N/A  Psychiatric Specialty Exam: Review of Systems  Psychiatric/Behavioral:  Positive for dysphoric mood. Negative for decreased concentration, hallucinations, self-injury, sleep disturbance and suicidal ideas. The patient is nervous/anxious. The patient is not hyperactive.     There were no vitals taken for this visit.There is no height or weight on file to calculate BMI.  General Appearance: Casual  Eye Contact:  Good  Speech:  Clear  and Coherent and Normal Rate  Volume:  Normal  Mood:  Anxious  Affect:  Appropriate  Thought Process:  Coherent, Goal Directed, and Descriptions of Associations: Intact  Orientation:  Full (Time, Place, and Person)  Thought Content: WDL   Suicidal Thoughts:  No  Homicidal Thoughts:  No  Memory:  Immediate;   Good Recent;   Good Remote;   Good  Judgement:  Good  Insight:  Good  Psychomotor Activity:  Normal  Concentration:  Concentration: Good and Attention Span: Good  Recall:  Good  Fund of Knowledge: Good  Language: Good  Akathisia:  No  Handed:  Right  AIMS (if indicated): not done  Assets:  Communication Skills Desire for Improvement Housing Physical Health Social Support  ADL's:  Intact  Cognition: WNL  Sleep:  Good   Screenings: CAGE-AID    Flowsheet Row ED to Hosp-Admission (Discharged) from 08/21/2023 in [facility]  CAGE-AID Score 0   GAD-7    Flowsheet Row Video Visit from 02/17/2024 in [facility] Office Visit from 01/04/2024 in [facility] Office Visit from 12/23/2023 in [facility] Patient Outreach from 12/21/2023 in [place] POPULATION HEALTH DEPARTMENT Office Visit from 11/09/2023 in [facility] at [facility]  Total GAD-7 Score 14 21 17 9 18    PHQ2-9    Flowsheet Row Video Visit from 02/17/2024 in [facility] Office Visit from 02/08/2024 in [facility] Office Visit from 01/04/2024 in [facility] Office Visit from 12/23/2023 in [facility] Office Visit from 12/07/2023 in [facility]  PHQ-2 Total Score 3 0 2 1 2   PHQ-9 Total Score 8 -- 6 7 --   Flowsheet Row Video Visit from 02/17/2024 in [facility] Office Visit from 01/04/2024 in [facility] ED from 11/20/2023 in [facility]  Emergency Department at [facility]  C-SSRS RISK CATEGORY No Risk No Risk No Risk     Assessment and Plan:   Erik Santana is a 33 year old male with a past psychiatric history significant for PTSD, generalized anxiety disorder/panic attacks, and adjustment disorder with mixed anxiety and depressed mood who presents to [facility] for follow-up.   Patient informed provider that he discontinued taking his Lexapro  due to experiencing side effects associated with the use of the medication.  He reports that he took the medication for 4 days before discontinuing.  He reports that he went a month without taking any medications besides his muscle relaxer and buspirone .  During that timeframe, patient reports that he experienced seizure-like activity/panic attacks especially when out in public.  Patient states that he was placed back on Valium  by his physical medicine and rehabilitation provider.  Patient reports that he is being closely monitored while taking Valium  by his provider.  Patient endorses minimal depression but states that he continues to have some intrusive thoughts related to his past.  In addition to taking his medications, patient states that his mood is alleviated by going on walks.  A PHQ-9 screen was performed with the patient scoring an 8.  A GAD-7 screen was also performed with the patient scoring a 14.  Patient's medications are currently being prescribed and monitored by Joesph C. Engler, DO.  Patient is currently being managed on the following psychiatric medications:  Valium  1 to 2 mg every 12 hours as needed Buspirone  15 mg 3 times daily Seroquel  25 mg at bedtime Trazodone  50 mg at bedtime as needed  Patient inquired about being seen by therapy through our facility.  He reports that he maintains residency and [facility] and is only temporarily living in [place].  Patient feels that he was pushed off to the side at a time where he really needed help.  Patient is hesitant to be set up with resources through this facility and is requesting that he receive resources for therapy and med management somewhere in [place].  Provider to discuss patient's residency with staff to determine if patient can be seen at this practice.  A [place] Suicide Severity Rating Scale was performed with the patient being considered no risk.  Patient denies suicidal ideations and is able to contract for safety at this time.    Collaboration of Care: Collaboration of Care: Primary Care Provider AEB patient being seen by family medicine, Psychiatrist AEB patient being followed by a mental health provider at this facility, and Other provider involved in patient's care AEB patient being seen by rehabilitation and physical medicine/rehab.  Patient/Guardian was advised Release of Information must be obtained prior to any record release in order to collaborate their care with an outside provider. Patient/Guardian was advised if they have not already done so to contact the registration department to sign all necessary forms in order for us  to release information regarding their care.   Consent: Patient/Guardian gives verbal consent for treatment and assignment of benefits for services provided during this visit. Patient/Guardian expressed understanding and agreed to proceed.   1. Generalized anxiety disorder Patient is being prescribed Valium  1 - 2 mg every 12 hours as needed for the management of his anxiety/panic attacks by Joesph C. Emeline, DO Patient is being prescribed Buspirone  15 mg 3 times daily for the management of his anxiety by Joesph C. Engler, DO  2. PTSD (post-traumatic stress disorder) (Primary)  3. Adjustment disorder with mixed anxiety and depressed mood History of intracerebral hemorrhage Patient is currently undergoing rehabilitation through Physical  Medicine/Rehabilitation  Patient is also being prescribed trazodone  50 mg at bedtime as needed for sleep by Joesph C. Emeline, DO Patient is also being prescribed Seroquel  25 mg at bedtime by Joesph BROCKS. Emeline, DO  To determine if patient can be seen at this practice given patient's residency status Provider spent a total of 28 minutes with the patient/reviewing the patient's chart  Reginia FORBES Bolster, PA 02/17/2024, 6:31 PM

## 2024-02-22 ENCOUNTER — Other Ambulatory Visit: Payer: Self-pay

## 2024-02-22 NOTE — Patient Outreach (Signed)
 Complex Care Management   Visit Note  02/22/2024  Name:  Erik Santana. MRN: [id number] DOB: 03-15-1991  Situation: Referral received for Complex Care Management related to SDOH Barriers:  Transportation I obtained verbal consent from Patient.  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 Pt was alert and cognitive. Pt informed BSW he signed paperwork to submit his SSDI application through [facility] this morning. Pt reports his CM is [place]. Pt talked about the possibility of moving to [facility] soon and his goal to get back to work. Pt also confirmed he is ok  in terms of SDOH needs and only struggles with transportation at this time. BSW reminded pt of his transportation benefit through [facility] and he has received information on how to use it. Pt states Dr. Emeline referred him to [facility] and is on the waitlist for free PT services. No other resources were provided at this time.   SDOH Interventions    Flowsheet Row Patient Outreach Telephone from 02/08/2024 in [place] POPULATION HEALTH DEPARTMENT Patient Outreach from 12/22/2023 in [place] POPULATION HEALTH DEPARTMENT Patient Outreach Telephone from 11/29/2023 in [place] POPULATION HEALTH DEPARTMENT Office Visit from 11/09/2023 in [facility] at [facility]  SDOH Interventions      Food Insecurity Interventions Intervention Not Indicated Community Resources Provided [employer] Provided Intervention Not Indicated, AMB Referral  Housing Interventions Intervention Not Indicated Intervention Not Indicated Intervention Not Indicated Intervention Not Indicated  Transportation Interventions Payor Benefit Intervention Not Indicated Intervention Not Indicated  Intervention Not Indicated  Utilities Interventions Intervention Not Indicated Community Resources Provided  [employer] - additional benefits - house/utility allowance.] Intervention Not Indicated Intervention Not Indicated  Alcohol Usage Interventions -- -- -- Intervention Not Indicated (Score <7)  Financial Strain Interventions -- -- Programmer, applications Provided Intervention Not Indicated  Physical Activity Interventions -- -- -- Intervention Not Indicated  Stress Interventions -- -- -- Intervention Not Indicated  Social Connections Interventions -- -- -- Intervention Not Indicated  Health Literacy Interventions -- -- -- Intervention Not Indicated      Recommendation:   Call [facility] re the appear process at next f/u for PT.   Follow Up Plan:   Telephone follow up appointment date/time:  03/01/2024 at 11:15AM  [name], BSW [place]/VBCI - [facility] Social Worker [phone]

## 2024-02-22 NOTE — Patient Instructions (Signed)
 Visit Information  Erik Santana was given information about Medicaid Managed Care team care coordination services as a part of their [facility] Medicaid benefit.   If you would like to schedule transportation through your [facility] plan, please call the following number at least 2 days in advance of your appointment: [phone].   You can also use the MTM portal or MTM mobile app to manage your rides. Reimbursement for transportation is available through [facility]! For the portal, please go to [email or web address]  Call the [facility] Crisis Line at [phone], at any time, 24 hours a day, 7 days a week. If you are in danger or need immediate medical attention call 911.   Erik Santana - following are the goals we discussed in your visit today:   Goals Addressed             This Visit's Progress    BSW VBCI Social Work Care Plan   On track    Problems:   Air traffic controller Insecurity   CSW Clinical Goal(s):   Over the next 2 weeks the Patient will work with Child psychotherapist to address concerns related to food insecurity and financial strain. .  Interventions:  SW will provide pt resources to food pantries in his community. SW will also share information with Pt regarding SSDI and [facility].   Patient Goals/Self-Care Activities:  Call Department of Social Services [phone] to apply for food stamps. Attempt to apply for SSDI.   Plan:   Telephone follow up appointment with care management team member scheduled for:  12/13/2023 at 11am.         Patient verbalizes understanding of instructions and care plan provided today and agrees to view in MyChart. Active MyChart status and patient understanding of how to access instructions and care plan via MyChart confirmed with patient.     Telephone follow up appointment with Managed Medicaid care management team member scheduled for: 03/01/2024 at 11:15am  [name], BSW [facility]I - [facility] Social Worker [phone]   Following is a copy of your plan of care:  There are no care plans that you recently modified to display for this patient.

## 2024-02-23 ENCOUNTER — Encounter (HOSPITAL_BASED_OUTPATIENT_CLINIC_OR_DEPARTMENT_OTHER): Admitting: Psychology

## 2024-02-23 ENCOUNTER — Encounter: Payer: Self-pay | Admitting: Psychology

## 2024-02-23 DIAGNOSIS — F411 Generalized anxiety disorder: Secondary | ICD-10-CM | POA: Diagnosis not present

## 2024-02-23 DIAGNOSIS — I629 Nontraumatic intracranial hemorrhage, unspecified: Secondary | ICD-10-CM

## 2024-02-23 DIAGNOSIS — R4586 Emotional lability: Secondary | ICD-10-CM | POA: Diagnosis not present

## 2024-02-23 DIAGNOSIS — G8194 Hemiplegia, unspecified affecting left nondominant side: Secondary | ICD-10-CM | POA: Diagnosis not present
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
## 2024-02-28 ENCOUNTER — Telehealth: Payer: Self-pay | Admitting: Diagnostic Neuroimaging

## 2024-02-28 ENCOUNTER — Telehealth: Admitting: Diagnostic Neuroimaging

## 2024-02-28 ENCOUNTER — Other Ambulatory Visit: Payer: Self-pay | Admitting: Licensed Clinical Social Worker

## 2024-02-28 DIAGNOSIS — I610 Nontraumatic intracerebral hemorrhage in hemisphere, subcortical: Secondary | ICD-10-CM

## 2024-02-28 DIAGNOSIS — G40109 Localization-related (focal) (partial) symptomatic epilepsy and epileptic syndromes with simple partial seizures, not intractable, without status epilepticus: Secondary | ICD-10-CM

## 2024-02-28 NOTE — Patient Instructions (Signed)
 Visit Information  Erik Santana was given information about Medicaid Managed Care team care coordination services as a part of their [facility] Medicaid benefit.   If you would like to schedule transportation through your [facility] plan, please call the following number at least 2 days in advance of your appointment: [phone].   You can also use the MTM portal or MTM mobile app to manage your rides. Reimbursement for transportation is available through [facility]! For the portal, please go to [email or web address]  Call the [facility] Crisis Line at [phone], at any time, 24 hours a day, 7 days a week. If you are in danger or need immediate medical attention call 911.   Erik Santana - following are the goals we discussed in your visit today:   Goals Addressed             This Visit's Progress    LCSW VBCI Social Work Care Plan   On track    Problems:   Disease Management support and education needs related to Anxiety with Panic Symptoms, and PTSD  CSW Clinical Goal(s):   Over the next 90 days the Patient will attend all scheduled medical appointments as evidenced by patient report and care team review of appointment completion in electronic MEDICAL RECORD NUMBER  demonstrate a reduction in symptoms related to Anxiety with Panic Symptoms, PTSD .  Interventions:  Mental Health:  Evaluation of current treatment plan related to Anxiety with Panic Symptoms, and PTSD Active listening / Reflection utilized Financial risk analyst / information provided Emotional Support Provided Mindfulness or Relaxation training provided Provided general psycho-education for mental health needs Quality of sleep assessed & Sleep Hygiene techniques promoted Reviewed mental health medications and discussed importance of compliance: Pt reports he will address med concerns with [facility] Counselor at upcoming appt Solution-Focued Strategies employed: Suicidal Ideation/Homicidal Ideation assessed: Pt  denies LCSW conducted a conference call with pt and [facility] to assist with coordinating patient care  Patient Goals/Self-Care Activities:  Continue taking your medication as prescribed.   Increase coping skills and healthy habits  Follow up with PCP regarding referral to [facility]  Attend scheduled medical appts   Plan:   Telephone follow up appointment with care management team member scheduled for:  2-4 weeks        Please see education materials related to topics discussed provided by MyChart link.  Patient verbalizes understanding of instructions and care plan provided today and agrees to view in MyChart. Active MyChart status and patient understanding of how to access instructions and care plan via MyChart confirmed with patient.     Licensed Clinical Social Worker will call patient on 10/07 at 2 PM  [name], LCSW [place]  [facility], [facility] Clinical Social Worker Direct Dial: [phone]  Fax: [phone] Website: [email or web address] 4:19 PM   Following is a copy of your plan of care:  There are no care plans that you recently modified to display for this patient.

## 2024-02-28 NOTE — Telephone Encounter (Signed)
 Spoke w/Pt regarding driving. Pt states he has had no episodes since 11/20/23. Pt had video visit w/provider 01/05/24 and provider noted:  According to [place] law, you can not drive unless you are seizure / syncope free for at least 6 months and under physician's care (may shorten to 3 months as patient does not lose consciousness and has ~15-30 minute warning before event occurs)   Discussed the provider notes w/Pt as he stated he understood that he could drive in 3 months as well. Pt also asked about a f/u MRI. Provider noted he would like it repeated in 6 mos from last MRI which was in June. Pt voiced understanding and thanks for the call back.

## 2024-02-28 NOTE — Patient Outreach (Signed)
 Complex Care Management   Visit Note  02/28/2024  Name:  Erik Santana. MRN: [id number] DOB: [birth year]  Situation: Referral received for Complex Care Management related to Mental/Behavioral Health diagnosis GAD/PTSD I obtained verbal consent from Patient.  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    Assessment: Patient Reported Symptoms:  Cognitive Cognitive Status: No symptoms reported, Alert and oriented to person, place, and time, Normal speech and language skills Cognitive/Intellectual Conditions Management [RPT]: Brain Injury   Health Maintenance Behaviors: Annual physical exam  Neurological Neurological Review of Symptoms: Other: [place] Neurological Symptoms/Conditions [RPT]: Patient requested [facility] to f/up with him regarding screens and driving provisions Neurological Management Strategies: Routine screening  HEENT HEENT Symptoms Reported: Not assessed      Cardiovascular Cardiovascular Symptoms Reported: Not assessed    Respiratory Respiratory Symptoms Reported: No symptoms reported    Endocrine Endocrine Symptoms Reported: No symptoms reported Is patient diabetic?: No    Gastrointestinal Gastrointestinal Symptoms Reported: No symptoms reported      Genitourinary Genitourinary Symptoms Reported: No symptoms reported    Integumentary Integumentary Symptoms Reported: No symptoms reported    Musculoskeletal Musculoskelatal Symptoms Reviewed: No symptoms reported        Psychosocial Psychosocial Symptoms Reported: Anxiety - if selected complete GAD Additional Psychological Details: Pt is receiving services with Psychologist through [facility] Behavioral Management Strategies: Coping strategies, Adequate rest Major Change/Loss/Stressor/Fears (CP): Medical condition,  self      02/28/2024    PHQ2-9 Depression Screening   Little interest or pleasure in doing things    Feeling down, depressed, or hopeless    PHQ-2 - Total Score    Trouble falling or staying asleep, or sleeping too much    Feeling tired or having little energy    Poor appetite or overeating     Feeling bad about yourself - or that you are a failure or have let yourself or your family down    Trouble concentrating on things, such as reading the newspaper or watching television    Moving or speaking so slowly that other people could have noticed.  Or the opposite - being so fidgety or restless that you have been moving around a lot more than usual    Thoughts that you would be better off dead, or hurting yourself in some way    PHQ2-9 Total Score    If you checked off any problems, how difficult have these problems made it for you to do your work, take care of things at home, or get along with other people    Depression Interventions/Treatment      There were no vitals filed for this visit.  Medications Reviewed Today     Reviewed by Ezzard Rolin BIRCH, LCSW (Social Worker) on 02/28/24 at 1407  Med List Status: <None>   Medication Order Taking? Sig Documenting Provider Last Dose Status Informant  acetaminophen  (TYLENOL ) 325 MG tablet [id number]  Take 1-2 tablets (325-650 mg total) by mouth every 6 (six) hours as needed for mild pain (pain score 1-3), headache or moderate pain (pain score 4-6) (325 mg for pain 1-3, 6500 mg for pain 4-6). Pegge Toribio PARAS, PA-C  Active Self, Pharmacy Records  atorvastatin  (LIPITOR) 20 MG tablet [id number]  Take 1 tablet (20 mg total)  by mouth daily. Lorren Greig PARAS, NP  Active   busPIRone  (BUSPAR ) 15 MG tablet [id number]  Take 1 tablet (15 mg total) by mouth 3 (three) times daily. Emeline Joesph BROCKS, DO  Active   diazepam  (VALIUM ) 2 MG tablet [id number]  Take 0.5-1 tablets (1-2 mg total) by mouth every 12 (twelve) hours as needed for anxiety or muscle spasms.  Emeline Joesph BROCKS, DO  Active   levETIRAcetam  (KEPPRA ) 750 MG tablet [id number]  Take 1 tablet (750 mg total) by mouth 2 (two) times daily. Take one 500 mg tablet and one 250 mg tablet together for a total of 750 mg, twice a day Penumalli, Vikram R, MD  Active   loratadine  (CLARITIN ) 10 MG tablet [id number]  Take 1 tablet (10 mg total) by mouth daily. Lorren Greig PARAS, NP  Active   meclizine  (ANTIVERT ) 25 MG tablet [id number]  Take 1 tablet (25 mg total) by mouth 3 (three) times daily as needed for dizziness. Emeline Joesph BROCKS, DO  Active Self, Pharmacy Records  pantoprazole  (PROTONIX ) 20 MG tablet [id number]  Take 1 tablet (20 mg total) by mouth daily. Emeline Joesph BROCKS, DO  Active   QUEtiapine  (SEROQUEL ) 25 MG tablet [id number]  Take 1 tablet (25 mg total) by mouth at bedtime as needed (sleep). Emeline Joesph BROCKS, DO  Active   traZODone  (DESYREL ) 50 MG tablet [id number]  Take 1 tablet (50 mg total) by mouth at bedtime. Emeline Joesph C, DO  Active   Vitamin D , Ergocalciferol , (DRISDOL ) 1.25 MG (50000 UNIT) CAPS capsule [id number]  Take 1 capsule (50,000 Units total) by mouth every 7 (seven) days for 12 doses. Lorren Greig PARAS, NP  Active             Recommendation:   Continue Current Plan of Care  Follow Up Plan:   Telephone follow-up in 1 month  [name], LCSW [facility] Health  [facility], [facility] Clinical Social Worker Direct Dial: [phone]  Fax: [phone] Website: [email or web address] 4:18 PM

## 2024-02-28 NOTE — Telephone Encounter (Signed)
 Pt  called to see if he can be cleared to drive and also is it possible for Md to place an order for   updated MRI to be done .

## 2024-03-01 ENCOUNTER — Other Ambulatory Visit: Payer: Self-pay

## 2024-03-01 NOTE — Patient Outreach (Signed)
 Complex Care Management   Visit Note  03/01/2024  Name:  Erik Santana. MRN: [id number] DOB: [birth year]  Situation: Referral received for Complex Care Management related to SDOH Barriers:  Transportation I obtained verbal consent from Patient.  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 Pt was alert and cognitive. Pt and BSW called [facility] to obtain more information on his situation regarding physical therapy.Pt was instructed to call PT provider.   BSW and pt called [facility] and was told that they had attempted to appeal the denial for physical therapy and it was denied along with a peer consultation. Pt was told over the phone he would have to wait until next calendar year for physical therapy. BSW will explore other options for free/pro bono physical therapy and will call [facility] to confirm he is on the wait list.   BSW reached out to [facility] and spoke with Elma. BSW was told he is on the waitlist and they are working on doing outreach to him soon to ask a couple questions before getting him scheduled. Per conversation, waitlist is not that long at this time and there are a couple folks in front of pt. BSW will communicate information to pt and to expect a call from them.   SDOH Interventions    Flowsheet Row Patient Outreach Telephone from 02/08/2024 in [place] POPULATION HEALTH DEPARTMENT Patient Outreach from 12/22/2023 in [place] POPULATION HEALTH DEPARTMENT Patient Outreach Telephone from 11/29/2023 in [place] POPULATION HEALTH DEPARTMENT Office Visit from 11/09/2023 in [facility] Primary Care at [facility]  SDOH Interventions      Food Insecurity Interventions Intervention Not Indicated Community  Resources Provided [employer] Provided Intervention Not Indicated, AMB Referral  Housing Interventions Intervention Not Indicated Intervention Not Indicated Intervention Not Indicated Intervention Not Indicated  Transportation Interventions Payor Benefit Intervention Not Indicated Intervention Not Indicated Intervention Not Indicated  Utilities Interventions Intervention Not Indicated Community Resources Provided  [employer] - additional benefits - house/utility allowance.] Intervention Not Indicated Intervention Not Indicated  Alcohol Usage Interventions -- -- -- Intervention Not Indicated (Score <7)  Financial Strain Interventions -- -- Programmer, applications Provided Intervention Not Indicated  Physical Activity Interventions -- -- -- Intervention Not Indicated  Stress Interventions -- -- -- Intervention Not Indicated  Social Connections Interventions -- -- -- Intervention Not Indicated  Health Literacy Interventions -- -- -- Intervention Not Indicated      Recommendation:   none  Follow Up Plan:   Telephone follow up appointment date/time:  03/08/2024 at 1:15pm  [name], BSW [place]/VBCI - [employer] Social Worker [phone]

## 2024-03-01 NOTE — Patient Instructions (Signed)
 Visit Information  Erik Santana was given information about Medicaid Managed Care team care coordination services as a part of their [facility] Medicaid benefit.   If you would like to schedule transportation through your [facility] plan, please call the following number at least 2 days in advance of your appointment: [phone].   You can also use the MTM portal or MTM mobile app to manage your rides. Reimbursement for transportation is available through [facility]! For the portal, please go to [email or web address]  Call the [facility] Crisis Line at [phone], at any time, 24 hours a day, 7 days a week. If you are in danger or need immediate medical attention call 911.   Erik Santana - following are the goals we discussed in your visit today:   Goals Addressed             This Visit's Progress    BSW VBCI Social Work Care Plan   On track    Problems:   Air traffic controller Insecurity   CSW Clinical Goal(s):   Over the next 2 weeks the Patient will work with Child psychotherapist to address concerns related to food insecurity and financial strain. .  Interventions:  SW will provide pt resources to food pantries in his community. SW will also share information with Pt regarding SSDI and the Disability Advocacy Center.   Patient Goals/Self-Care Activities:  Call Department of Social Services [phone] to apply for food stamps. Attempt to apply for SSDI.   Plan:   Telephone follow up appointment with care management team member scheduled for:  12/13/2023 at 11am.          Patient verbalizes understanding of instructions and care plan provided today and agrees to view in MyChart. Active MyChart status and patient understanding of how to access instructions and care plan via MyChart confirmed with patient.     Telephone follow up appointment with Managed Medicaid care management team member scheduled for: 03/08/2024 at 1:15pm  [name], BSW [place]  [facility]/VBCI - [facility] Social Worker [phone]   Following is a copy of your plan of care:  There are no care plans that you recently modified to display for this patient.

## 2024-03-02 ENCOUNTER — Encounter: Attending: Physical Medicine and Rehabilitation | Admitting: Psychology

## 2024-03-02 DIAGNOSIS — R252 Cramp and spasm: Secondary | ICD-10-CM | POA: Diagnosis present

## 2024-03-02 DIAGNOSIS — F411 Generalized anxiety disorder: Secondary | ICD-10-CM

## 2024-03-02 DIAGNOSIS — I629 Nontraumatic intracranial hemorrhage, unspecified: Secondary | ICD-10-CM

## 2024-03-02 DIAGNOSIS — G8194 Hemiplegia, unspecified affecting left nondominant side: Secondary | ICD-10-CM | POA: Insufficient documentation

## 2024-03-02 DIAGNOSIS — F4310 Post-traumatic stress disorder, unspecified: Secondary | ICD-10-CM

## 2024-03-03 NOTE — Telephone Encounter (Signed)
 Ok to return to driving as long as he has been seizure free since 11/20/23, as discussed at last visit. -VRP

## 2024-03-05 NOTE — Telephone Encounter (Signed)
 Cld Pt to inform him provider agrees with him driving as long as seizure free since 11/20/23. Pt confirmed and voiced thanks for the call back.

## 2024-03-08 ENCOUNTER — Other Ambulatory Visit: Payer: Self-pay

## 2024-03-08 NOTE — Patient Instructions (Signed)
 Visit Information  Erik Santana was given information about Medicaid Managed Care team care coordination services as a part of their [facility] Medicaid benefit.   If you would like to schedule transportation through your [facility] plan, please call the following number at least 2 days in advance of your appointment: [phone].   You can also use the MTM portal or MTM mobile app to manage your rides. Reimbursement for transportation is available through [facility]! For the portal, please go to [email or web address]  Call the [facility] Crisis Line at [phone], at any time, 24 hours a day, 7 days a week. If you are in danger or need immediate medical attention call 911.   Erik Santana - following are the goals we discussed in your visit today:   Goals Addressed             This Visit's Progress    COMPLETED: BSW VBCI Social Work Care Plan   On track    Problems:   Air traffic controller Insecurity   CSW Clinical Goal(s):   Over the next 2 weeks the Patient will work with Child psychotherapist to address concerns related to food insecurity and financial strain. .  Interventions:  SW will provide pt resources to food pantries in his community. SW will also share information with Pt regarding SSDI and the Disability Advocacy Center.   Patient Goals/Self-Care Activities:  Call Department of Social Services [phone] to apply for food stamps. Attempt to apply for SSDI.   Plan:   Telephone follow up appointment with care management team member scheduled for:  12/13/2023 at 11am.         Patient verbalizes understanding of instructions and care plan provided today and agrees to view in MyChart. Active MyChart status and patient understanding of how to access instructions and care plan via MyChart confirmed with patient.     Telephone follow up appointment with Managed Medicaid care management team member scheduled for: 03/15/2024 at 1:15pm  [name],  BSW [place]/VBCI - [facility] Social Worker [phone]   Following is a copy of your plan of care:  There are no care plans that you recently modified to display for this patient.

## 2024-03-08 NOTE — Patient Outreach (Signed)
 Complex Care Management   Visit Note  03/08/2024  Name:  Erik Santana. MRN: [id number] DOB: [birth year]  Situation: Referral received for Complex Care Management related to SDOH Barriers:  physical therapy options I obtained verbal consent from Patient.  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 Pt was alert and cognitive. Pt reports he received a letter from [facility] regarding his recent SSDI claim. Pt was instructed to contact his case manager at [facility] to better understand the purpose of the letter and how to respond to it. Pt understood and agreed. Pt also confirmed with BSW he received a call from the [facility] re the wait list for pro bono physical therapy. Pt states he was told they will call him back once they have a clinic ready to see him.  BSW and pt called [facility] together re additional benefits for member, specifically the [employer] benefit. Pt was explained how program works and was encourage to call My Health Pays Department at [phone] to f/u on fitness/wellness benefit. Pt is interested in yoga classes and/or gym membership. BSW will f/u on this at next appt or help pt call. No other resources were provided/requested at this time.   SDOH Interventions    Flowsheet Row Patient Outreach Telephone from 02/08/2024 in [place] POPULATION HEALTH DEPARTMENT Patient Outreach from 12/22/2023 in [place] POPULATION HEALTH DEPARTMENT Patient Outreach Telephone from 11/29/2023 in [place] POPULATION HEALTH DEPARTMENT Office Visit from 11/09/2023 in [facility] Health Primary Care at [facility]  SDOH Interventions      Food Insecurity Interventions Intervention Not Indicated Community Resources Provided [employer] Provided Intervention Not  Indicated, AMB Referral  Housing Interventions Intervention Not Indicated Intervention Not Indicated Intervention Not Indicated Intervention Not Indicated  Transportation Interventions Payor Benefit Intervention Not Indicated Intervention Not Indicated Intervention Not Indicated  Utilities Interventions Intervention Not Indicated Community Resources Provided  [employer] - additional benefits - house/utility allowance.] Intervention Not Indicated Intervention Not Indicated  Alcohol Usage Interventions -- -- -- Intervention Not Indicated (Score <7)  Financial Strain Interventions -- -- Programmer, applications Provided Intervention Not Indicated  Physical Activity Interventions -- -- -- Intervention Not Indicated  Stress Interventions -- -- -- Intervention Not Indicated  Social Connections Interventions -- -- -- Intervention Not Indicated  Health Literacy Interventions -- -- -- Intervention Not Indicated      Recommendation:   Call [place] regarding fitness/wellness benefit at [phone]  Follow Up Plan:   Telephone follow up appointment date/time:  03/15/2024 at 1:15pm  [name], BSW [place]/VBCI - [employer] Social Worker [phone]

## 2024-03-09 ENCOUNTER — Encounter: Admitting: Psychology

## 2024-03-09 NOTE — Progress Notes (Addendum)
 Neuropsychology / Psychotherapy Note  Jolynn DEL. [facility]  Physical Medicine and Rehabilitation    Patient: Erik Santana  DOB: 1991-03-13  MRN: [id number]  Provider: Evalene DOROTHA Riff, PsyD Modality & Individuals Present: The patient was seen in-person, unaccompanied, by the provider in the PM&R outpatient clinic office.  Location: [facility] Physical [facility] [address] [place] [address]  Date of Service: 03/02/2024 Duration of Service:  55 min Record Review & Presenting Concerns (Per 02/23/24 intake):The patient is a 33 year old male referred for psychotherapy.  The patient has a past medical history, per records, of PTSD, ADHD, GAD. Hospital medical records (08/24/23) indicated the patient presented with left sided weakness, NIH of 7 on admission, and subsequent CT of the head showed  CT head showed ICH in right fronto parietal region likely to be embolic in nature. MRI showed unchanged appearance of intraparenchymal hematoma within the superior right frontal lobe. No mass lesion. Echo showed LVEF 55-60%, Patient underwent cerebral angiogram which showed no gross abnormalities identified in terms of aneurysms, dural AV fistula, A/V shunting, dissections, or  intraluminal filling defects.Dural venous sinuses grossly patent. Neurology recommended no antithrombotic due to acute ICH. Patient cleared for discharge to acute inpatient rehab.Neurology signed off. 09/22/2023 PM&R HPI indicated ... 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  Presented 08/21/2023 with acute onset of left-sided weakness as well as dizziness and headache.  He was able to call EMS himself.  EMS found him on the floor... CT of the head showed a 3.9 x 2.4 x 4.1 cm parenchymal hemorrhage centered at the right frontal parietal junction.  No evidence of intraventricular extension or mass effect.  CT cervical spine negative.   CTA showed no intracranial large vessel occlusion or significant stenosis...MRI follow-up showed unchanged appearance of intraparenchymal hematoma within the superior right frontal lobe.  No mass lesion.  Arteriogram completed showing no gross abnormalities... [name] added for tobacco use.  Blood pressure controlled he has not required Cleviprex .  Therapy evaluations completed due to patient decreased functional mobility was admitted for a comprehensive rehab program. The patient met with Psychiatry with [facility] to establish care on 01/04/2024. Visit notes indicate the patient had been on Lexapro  but discontinued due to side effects. He had been off medications excluding muscle relaxer and buspirone  for about one month. Difficulties with panic/anxiety when in public settings became prominent. The patient reached out to PM&R for assistance with medications following that.  Visit notes from his PM&R visit on 02/08/2024 indicated he was started on trazodone  to help with sleep.  Prazosin  was discontinued due to dreams.  Valium  was resumed.  In notes indicated the physiatrist is reaching out to disability specialist for paperwork needs for his application.  Visit notes indicated spasticity and GERD were also present.   The patient presents with premorbid ADHD, anxiety, and history of trauma. 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 Difficulties with anxiety are prominent, and there is some concern for PTSD necessitating closer assessment. The patient demonstrates excellent insight into his difficulties and descriptions suggest strong ability for perspective taking, but linked to complex and challenging environment growing up. The patient is likely to benefit from individual therapy integrating  mindfulness/relaxation strategies, along with EF cognitive rehab strategies if needed. Trauma treatment  may be beneficial, and emotional suppression/avoidance may be factors exacerbating symptoms and warrant attention as well. Current session focused on obtaining background information and diagnostic information as well as establishing rapport and clarifying patient goals for treatment. Emotion regulation is primary focus, particularly with management of anxiety (cognitive and physiological Sx). Adjustment to injury work and more concrete goal setting (finding purpose/direction). Increased activity levels. 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  Adjustment to injury & Cognitive - Psychoeducation - Goal setting - Cog Rehab / Compensatory strategies PTSD/Adjustment disorder(?) - Evaluate to clarify differential - Psychoeducation - CPT if indicated.  Anxiety/Panic - Psychoeducation - CBT - Relaxation/mindfulness - Behavioral ADHD? - Evaluate - Coordinate with Emeline or psychiatry as needed.   Session Content: Goals focused on this session: Establish/build rapport, Pt goals, additional background      Themes/Topics Discussed: -Pt goals - being able to work again; focus on personal growth; clarify direction and purpose; learn how to put myself first and build more healthy relationships for myself that help me be the person I want to be. -Aspects of upbringing.  -Difficulties with emotion regulation.  -Onset of worrying/rumination. Never present before.   -Prominent psych issues involve anxiety and panic attacks.  -Previously emotionally  avoidant, possibly.  -Previously Dx with adderall and benzo during youth. Dx ADHD.  -Employment in Public Affairs Consultant Interventions and Techniques Used: Empathic support, psychoeducation Behavioral/Response to Intervention/Barriers to Progression:  Alertness/Attention: Alert. Easily side-tracked/tangential.  Orientation: Intact Language: Speech prosodic, fluent, well articulated. Rate slightly increased. No indications of psychosis.  Nonverbal communication - bit anxious.  Motor/Sensory: Ambulated independently and without issue.  Mood/Affect: Mood slightly anxious, but good. Affect congruent.  Interaction: Cooperative, engaged. Genuine.  Cognition: Tangential, anxious.  Emotional Processing: - Progress: Rapport built rapidly Barriers: Difficulties with attention/concentration.    Suicide/Homicide/Aggression Risk: No SI or HI. No history of attempt or ideation. No alcohol use. No recent deaths of loved ones.    Goals for Next Session: -Assess wellness / lifestyle factors -Self care -Cognitive Sx -Personal goals to work towards  Evalene DOROTHA Riff, PsyD Clinical Neuropsychology [address] [place], [place] [address] Main: [phone] Fax: [phone]   [address] [address]: [phone]   This report was generated using voice recognition software. While this document has been carefully reviewed, transcription errors may be present. I apologize in advance for any inconvenience. Please contact me if further clarification is needed.

## 2024-03-13 ENCOUNTER — Encounter: Payer: Self-pay | Admitting: Physical Medicine and Rehabilitation

## 2024-03-13 ENCOUNTER — Telehealth: Payer: Self-pay | Admitting: Family

## 2024-03-13 ENCOUNTER — Other Ambulatory Visit: Payer: Self-pay | Admitting: Family

## 2024-03-13 DIAGNOSIS — M21372 Foot drop, left foot: Secondary | ICD-10-CM

## 2024-03-13 DIAGNOSIS — E559 Vitamin D deficiency, unspecified: Secondary | ICD-10-CM

## 2024-03-13 DIAGNOSIS — I629 Nontraumatic intracranial hemorrhage, unspecified: Secondary | ICD-10-CM

## 2024-03-13 DIAGNOSIS — N62 Hypertrophy of breast: Secondary | ICD-10-CM

## 2024-03-13 DIAGNOSIS — R0789 Other chest pain: Secondary | ICD-10-CM

## 2024-03-13 DIAGNOSIS — N50819 Testicular pain, unspecified: Secondary | ICD-10-CM

## 2024-03-13 NOTE — Telephone Encounter (Signed)
 Copied from CRM [id number]. Topic: Clinical - Prescription Issue >> Mar 13, 2024 10:24 AM Zebedee SAUNDERS wrote:  Reason for CRM: Pt would like to change from loratadine  (CLARITIN ) 10 MG tablet to Zyrtec   which is more effective for pt. Please call pt at [phone].

## 2024-03-13 NOTE — Telephone Encounter (Signed)
 Schedule appointment?

## 2024-03-13 NOTE — Telephone Encounter (Signed)
 Appt scheduled

## 2024-03-14 ENCOUNTER — Encounter: Payer: Self-pay | Admitting: Family

## 2024-03-14 ENCOUNTER — Ambulatory Visit (INDEPENDENT_AMBULATORY_CARE_PROVIDER_SITE_OTHER): Admitting: Family

## 2024-03-14 VITALS — BP 137/89 | HR 83 | Temp 98.7°F | Resp 18 | Ht 75.0 in | Wt 217.2 lb

## 2024-03-14 DIAGNOSIS — E559 Vitamin D deficiency, unspecified: Secondary | ICD-10-CM

## 2024-03-14 DIAGNOSIS — Z13228 Encounter for screening for other metabolic disorders: Secondary | ICD-10-CM | POA: Diagnosis not present

## 2024-03-14 DIAGNOSIS — E785 Hyperlipidemia, unspecified: Secondary | ICD-10-CM | POA: Diagnosis not present

## 2024-03-14 DIAGNOSIS — J3089 Other allergic rhinitis: Secondary | ICD-10-CM | POA: Diagnosis not present

## 2024-03-14 MED ORDER — ATORVASTATIN CALCIUM 20 MG PO TABS: 20.0000 mg | ORAL_TABLET | Freq: Every day | ORAL | 0 refills | Status: DC

## 2024-03-14 MED ORDER — CETIRIZINE HCL 10 MG PO TABS: 10.0000 mg | ORAL_TABLET | Freq: Every day | ORAL | 0 refills | Status: DC

## 2024-03-14 NOTE — Progress Notes (Signed)
 Needs vitamin D  and wants some zyrtec  tac , medication refill on cholesterol medication, patient scored a 13 on GAD-7

## 2024-03-14 NOTE — Progress Notes (Signed)
 Patient ID: [name], male    DOB: 11-19-1990  MRN: [id number]  CC: Chronic Conditions Follow-Up  Subjective: Erik Santana is a 33 y.o. male who presents for  chronic conditions follow-up.   His concerns today include:  - Doing well on Atorvastatin , no issues/concerns.  - States Zyrtec  helps with allergies compared to Claritin .  - Vitamin D  lab.  - Liver function lab. - Established with Psychiatry. He denies thoughts of self-harm, suicidal ideations, homicidal ideations.  Patient Active Problem List   Diagnosis Date Noted   Adjustment disorder with mixed anxiety and depressed mood 02/17/2024   Gastroesophageal reflux disease 02/08/2024   Seizure-like activity (HCC) 12/16/2023   PTSD (post-traumatic stress disorder) 12/07/2023   Left hemiparesis (HCC) 12/07/2023   Spasticity 12/07/2023   Emotional lability 10/05/2023   Left foot drop 10/05/2023   Cognitive change 08/30/2023   Intraparenchymal hematoma of brain (HCC) 08/24/2023   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   Generalized anxiety disorder 10/27/2012   Hematemesis 10/18/2012   Other esophagitis 10/18/2012   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     busPIRone  (BUSPAR ) 15 MG tablet Take 1 tablet (15 mg total) by mouth 3 (three) times daily. 60 tablet 5   diazepam  (VALIUM ) 2 MG tablet Take 0.5-1 tablets (1-2 mg total) by mouth every 12 (twelve) hours as needed for anxiety or muscle spasms. 60 tablet 2   levETIRAcetam  (KEPPRA ) 750 MG tablet Take 1 tablet (750 mg total) by mouth 2 (two) times daily. Take one 500 mg tablet and one 250 mg tablet together for a total of 750 mg, twice a  day 180 tablet 4   loratadine  (CLARITIN ) 10 MG tablet Take 1 tablet (10 mg total) by mouth daily. 90 tablet 0   meclizine  (ANTIVERT ) 25 MG tablet Take 1 tablet (25 mg total) by mouth 3 (three) times daily as needed for dizziness. 30 tablet 3   pantoprazole  (PROTONIX ) 20 MG tablet Take 1 tablet (20 mg total) by mouth daily. 90 tablet 1   QUEtiapine  (SEROQUEL ) 25 MG tablet Take 1 tablet (25 mg total) by mouth at bedtime as needed (sleep). 60 tablet 0   traZODone  (DESYREL ) 50 MG tablet Take 1 tablet (50 mg total) by mouth at bedtime. 90 tablet 1   No current facility-administered medications on file prior to visit.    Allergies  Allergen Reactions   Latex Hives   Nsaids Other (See Comments)    History of a Mallory-Weiss tear (had surgery to fix this 10+ years ago)    Social History   Socioeconomic History   Marital status: Single    Spouse name: Not on file   Number of children: Not on file   Years of education: Not on file   Highest education level: Not on file  Occupational History   Not on file  Tobacco Use   Smoking status: Former    Current packs/day: 0.50    Types: Cigarettes   Smokeless tobacco: Never  Vaping Use   Vaping status: Former  Substance and Sexual Activity   Alcohol use: Not Currently    Comment: occ   Drug use: No  Sexual activity: Not on file  Other Topics Concern   Not on file  Social History Narrative   Not on file   Social Drivers of Health   Financial Resource Strain: Medium Risk (11/29/2023)   Overall Financial Resource Strain (CARDIA)    Difficulty of Paying Living Expenses: Somewhat hard  Food Insecurity: No Food Insecurity (02/08/2024)   Hunger Vital Sign    Worried About Running Out of Food in the Last Year: Never true    Ran Out of Food in the Last Year: Never true  Recent Concern: Food Insecurity - Food Insecurity Present (12/22/2023)   Hunger Vital Sign    Worried About Running Out of Food in the Last Year: Sometimes true    Ran Out of  Food in the Last Year: Sometimes true  Transportation Needs: Unmet Transportation Needs (02/08/2024)   PRAPARE - Administrator, Civil Service (Medical): Yes    Lack of Transportation (Non-Medical): Yes  Physical Activity: Sufficiently Active (11/09/2023)   Exercise Vital Sign    Days of Exercise per Week: 5 days    Minutes of Exercise per Session: 30 min  Stress: No Stress Concern Present (11/09/2023)   [employer] of Occupational Health - Occupational Stress Questionnaire    Feeling of Stress : Only a little  Social Connections: Socially Isolated (11/09/2023)   Social Connection and Isolation Panel    Frequency of Communication with Friends and Family: More than three times a week    Frequency of Social Gatherings with Friends and Family: More than three times a week    Attends Religious Services: Never    Database administrator or Organizations: No    Attends Banker Meetings: Never    Marital Status: Never married  Intimate Partner Violence: Not At Risk (02/08/2024)   Humiliation, Afraid, Rape, and Kick questionnaire    Fear of Current or Ex-Partner: No    Emotionally Abused: No    Physically Abused: No    Sexually Abused: No    Family History  Problem Relation Age of Onset   Healthy Mother    Healthy Father     Past Surgical History:  Procedure Laterality Date   ESOPHAGOGASTRODUODENOSCOPY N/A 10/18/2012   Procedure: ESOPHAGOGASTRODUODENOSCOPY (EGD);  Surgeon: Lamar JONETTA Aho, MD;  Location: [facility];  Service: Endoscopy;  Laterality: N/A;   ESOPHAGOGASTRODUODENOSCOPY (EGD) WITH PROPOFOL  N/A 10/18/2012   Procedure: ESOPHAGOGASTRODUODENOSCOPY (EGD) WITH PROPOFOL ;  Surgeon: Lamar JONETTA Aho, MD;  Location: [facility];  Service: Endoscopy;  Laterality: N/A;   FINGER SURGERY     IR ANGIO INTRA EXTRACRAN SEL COM CAROTID INNOMINATE BILAT MOD SED  08/23/2023   IR ANGIO VERTEBRAL SEL SUBCLAVIAN INNOMINATE UNI L MOD SED  08/23/2023   IR ANGIO VERTEBRAL  SEL VERTEBRAL UNI R MOD SED  08/23/2023   OPEN REDUCTION INTERNAL FIXATION (ORIF) PROXIMAL PHALANX Right 10/12/2022   Procedure: Right ring finger middle phalanx and proximal interphalangeal joint closed reduction internal percutaneous pinning;  Surgeon: Alyse Agent, MD;  Location: [facility];  Service: Orthopedics;  Laterality: Right;    ROS: Review of Systems Negative except as stated above  PHYSICAL EXAM: BP 137/89   Pulse 83   Temp 98.7 F (37.1 C) (Oral)   Resp 18   Ht 6' 3 (1.905 m)   Wt 217 lb 3.2 oz (98.5 kg)   SpO2 95%   BMI 27.15 kg/m   Physical Exam HENT:     Head: Normocephalic and atraumatic.  Nose: Nose normal.     Mouth/Throat:     Mouth: Mucous membranes are moist.     Pharynx: Oropharynx is clear.  Eyes:     Extraocular Movements: Extraocular movements intact.     Conjunctiva/sclera: Conjunctivae normal.     Pupils: Pupils are equal, round, and reactive to light.  Cardiovascular:     Rate and Rhythm: Normal rate and regular rhythm.     Pulses: Normal pulses.     Heart sounds: Normal heart sounds.  Pulmonary:     Effort: Pulmonary effort is normal.     Breath sounds: Normal breath sounds.  Musculoskeletal:        General: Normal range of motion.     Cervical back: Normal range of motion and neck supple.  Neurological:     General: No focal deficit present.     Mental Status: He is alert and oriented to person, place, and time.  Psychiatric:        Mood and Affect: Mood normal.        Behavior: Behavior normal.     ASSESSMENT AND PLAN: 1. Hyperlipidemia, unspecified hyperlipidemia type (Primary) - Continue Atorvastatin  as prescribed. Counseled on medication adherence/adverse effects.  - Routine screening.  - Follow-up with primary provider as scheduled. - atorvastatin  (LIPITOR) 20 MG tablet; Take 1 tablet (20 mg total) by mouth daily.  Dispense: 90 tablet; Refill: 0 - Lipid panel  2. Perennial allergic rhinitis - Cetirizine   as prescribed. Counseled on medication adherence/adverse effects. - Follow-up with primary provider as scheduled.  - cetirizine  (ZYRTEC ) 10 MG tablet; Take 1 tablet (10 mg total) by mouth daily.  Dispense: 90 tablet; Refill: 0  3. Vitamin D  deficiency - Routine screening.  - Vitamin D , 25-hydroxy  4. Screening for metabolic disorder - Routine screening.  - ALT   Patient was given the opportunity to ask questions.  Patient verbalized understanding of the plan and was able to repeat key elements of the plan. Patient was given clear instructions to go to Emergency Department or return to medical center if symptoms don't improve, worsen, or new problems develop.The patient verbalized understanding.   Orders Placed This Encounter  Procedures   Lipid panel   ALT   Vitamin D , 25-hydroxy     Requested Prescriptions   Signed Prescriptions Disp Refills   atorvastatin  (LIPITOR) 20 MG tablet 90 tablet 0    Sig: Take 1 tablet (20 mg total) by mouth daily.   cetirizine  (ZYRTEC ) 10 MG tablet 90 tablet 0    Sig: Take 1 tablet (10 mg total) by mouth daily.    Return in about 3 months (around 06/13/2024) for Follow-Up or next available chronic conditions.  Greig JINNY Drones, NP

## 2024-03-14 NOTE — Telephone Encounter (Signed)
 Patient was here today for an appointment and talked with PCP

## 2024-03-15 ENCOUNTER — Other Ambulatory Visit: Payer: Self-pay

## 2024-03-15 ENCOUNTER — Encounter: Admitting: Psychology

## 2024-03-15 ENCOUNTER — Ambulatory Visit: Payer: Self-pay | Admitting: Family

## 2024-03-15 DIAGNOSIS — R252 Cramp and spasm: Secondary | ICD-10-CM | POA: Diagnosis not present

## 2024-03-15 DIAGNOSIS — I629 Nontraumatic intracranial hemorrhage, unspecified: Secondary | ICD-10-CM

## 2024-03-15 DIAGNOSIS — F411 Generalized anxiety disorder: Secondary | ICD-10-CM

## 2024-03-15 DIAGNOSIS — E785 Hyperlipidemia, unspecified: Secondary | ICD-10-CM

## 2024-03-15 LAB — LIPID PANEL
Chol/HDL Ratio: 4 ratio (ref 0.0–5.0)
Cholesterol, Total: 141 mg/dL (ref 100–199)
HDL: 35 mg/dL — ABNORMAL LOW (ref >39)
LDL Chol Calc (NIH): 58 mg/dL (ref 0–99)
Triglycerides: 306 mg/dL — ABNORMAL HIGH (ref 0–149)
VLDL Cholesterol Cal: 48 mg/dL — ABNORMAL HIGH (ref 5–40)

## 2024-03-15 LAB — VITAMIN D 25 HYDROXY (VIT D DEFICIENCY, FRACTURES): Vit D, 25-Hydroxy: 31.2 ng/mL (ref 30.0–100.0)

## 2024-03-15 LAB — ALT: ALT: 37 IU/L (ref 0–44)

## 2024-03-15 MED ORDER — ATORVASTATIN CALCIUM 40 MG PO TABS: 40.0000 mg | ORAL_TABLET | Freq: Every day | ORAL | 0 refills | Status: DC

## 2024-03-15 NOTE — Patient Instructions (Signed)
 Visit Information  Erik Santana was given information about Medicaid Managed Care team care coordination services as a part of their [facility] Medicaid benefit.   If you would like to schedule transportation through your [facility] plan, please call the following number at least 2 days in advance of your appointment: [phone].   You can also use the MTM portal or MTM mobile app to manage your rides. Reimbursement for transportation is available through [facility]! For the portal, please go to [email or web address]  Call the [facility] Crisis Line at [phone], at any time, 24 hours a day, 7 days a week. If you are in danger or need immediate medical attention call 911.   Patient verbalizes understanding of instructions and care plan provided today and agrees to view in MyChart. Active MyChart status and patient understanding of how to access instructions and care plan via MyChart confirmed with patient.     Telephone follow up appointment with Managed Medicaid care management team member scheduled for: 03/22/2024 at 3:30pm  [name], BSW [place]/VBCI - [facility] Social Worker [phone]   Following is a copy of your plan of care:  There are no care plans that you recently modified to display for this patient.

## 2024-03-15 NOTE — Patient Outreach (Signed)
 Complex Care Management   Visit Note  03/15/2024  Name:  Erik Santana. MRN: [id number] DOB: 05-18-1991  Situation: Referral received for Complex Care Management related to [facility] wellness/fitness benefit. I obtained verbal consent from Patient.  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 Pt was alert and cognitive. Pt confirmed with BSW he applied for SSDI through [facility]. Pt was advised it would take about 8 months before he has an answer re his application.   BSW and Pt called insurance regarding his fitness/wellness benefit. They instructed pt to call back next Thursday to confirm paperwork for healthy incentives was received. Pt was interested in yoga classes and was provided with 4 different fitness centers that offer yoga classes. BSW and pt will f/u about this at next f/u. No other resources were provided/requested at this time.   SDOH Interventions    Flowsheet Row Patient Outreach Telephone from 02/08/2024 in [place] POPULATION HEALTH DEPARTMENT Patient Outreach from 12/22/2023 in [place] POPULATION HEALTH DEPARTMENT Patient Outreach Telephone from 11/29/2023 in [place] POPULATION HEALTH DEPARTMENT Office Visit from 11/09/2023 in [facility]  SDOH Interventions      Food Insecurity Interventions Intervention Not Indicated Community Resources Provided [employer] Provided Intervention Not Indicated, AMB Referral  Housing Interventions Intervention Not Indicated Intervention Not Indicated Intervention Not Indicated Intervention Not Indicated  Transportation Interventions Payor Benefit Intervention Not Indicated Intervention Not Indicated Intervention Not Indicated  Utilities  Interventions Intervention Not Indicated Community Resources Provided  [employer] - additional benefits - house/utility allowance.] Intervention Not Indicated Intervention Not Indicated  Alcohol Usage Interventions -- -- -- Intervention Not Indicated (Score <7)  Financial Strain Interventions -- -- Programmer, applications Provided Intervention Not Indicated  Physical Activity Interventions -- -- -- Intervention Not Indicated  Stress Interventions -- -- -- Intervention Not Indicated  Social Connections Interventions -- -- -- Intervention Not Indicated  Health Literacy Interventions -- -- -- Intervention Not Indicated      Recommendation:   none  Follow Up Plan:   Telephone follow up appointment date/time:  03/22/2024 at 3:30pm  Erik Santana, BSW [place]/VBCI - [employer] Social Worker [phone]

## 2024-03-20 ENCOUNTER — Other Ambulatory Visit (HOSPITAL_COMMUNITY): Payer: Self-pay | Admitting: Physician Assistant

## 2024-03-20 DIAGNOSIS — F411 Generalized anxiety disorder: Secondary | ICD-10-CM

## 2024-03-21 ENCOUNTER — Encounter: Admitting: Psychology

## 2024-03-22 ENCOUNTER — Other Ambulatory Visit: Payer: Self-pay

## 2024-03-22 NOTE — Patient Outreach (Signed)
 Complex Care Management   Visit Note  03/22/2024  Name:  Erik Santana. MRN: [id number] DOB: [birth year]  Situation: Referral received for Complex Care Management related to SDOH Barriers:  Fitness benefit I obtained verbal consent from Patient.  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 Pt was alert and cognitive. Pt confirmed no other needs at this time other than navigating fitness benefit with [facility] and [employer].   BSW and pt called [employer] to confirm if his 2 preventative activities had been sent to [employer] Department for him to qualify for a healthy rewards gift card and gym membership benefit. Pt was told that they did not see any dates of service for the days he was requesting. After providing clarity, BSW informed rep pt had an annual wellness visit and A1c check on 06/27. Rep. Confirmed she saw those dates and confirmed they were billed to [facility]. Pt was instructed to call [employer] back to inquire about his fitness benefit activities for dates 12/23/2023. Pt agreed to call. No other resources provided/requested at this time.   SDOH Interventions    Flowsheet Row Patient Outreach Telephone from 02/08/2024 in [place] POPULATION HEALTH DEPARTMENT Patient Outreach from 12/22/2023 in [place] POPULATION HEALTH DEPARTMENT Patient Outreach Telephone from 11/29/2023 in [place] POPULATION HEALTH DEPARTMENT Office Visit from 11/09/2023 in [facility] at [facility]  SDOH Interventions      Food Insecurity Interventions Intervention Not Indicated Community Resources Provided [employer] Provided Intervention Not Indicated, AMB Referral  Housing Interventions Intervention Not Indicated Intervention Not Indicated  Intervention Not Indicated Intervention Not Indicated  Transportation Interventions Payor Benefit Intervention Not Indicated Intervention Not Indicated Intervention Not Indicated  Utilities Interventions Intervention Not Indicated Community Resources Provided  [employer] - additional benefits - house/utility allowance.] Intervention Not Indicated Intervention Not Indicated  Alcohol Usage Interventions -- -- -- Intervention Not Indicated (Score <7)  Financial Strain Interventions -- -- Programmer, applications Provided Intervention Not Indicated  Physical Activity Interventions -- -- -- Intervention Not Indicated  Stress Interventions -- -- -- Intervention Not Indicated  Social Connections Interventions -- -- -- Intervention Not Indicated  Health Literacy Interventions -- -- -- Intervention Not Indicated      Recommendation:   None  Follow Up Plan:   Telephone follow up appointment date/time:  04/05/2024 at 2pm  [name], [place]/VBCI - [facility] Social Worker [phone]

## 2024-03-22 NOTE — Patient Instructions (Signed)
 Visit Information  Erik Santana was given information about Medicaid Managed Care team care coordination services as a part of their [facility] Medicaid benefit.   If you would like to schedule transportation through your [facility] plan, please call the following number at least 2 days in advance of your appointment: [phone].   You can also use the MTM portal or MTM mobile app to manage your rides. Reimbursement for transportation is available through [facility]! For the portal, please go to [email or web address]  Call the [facility] Crisis Line at [phone], at any time, 24 hours a day, 7 days a week. If you are in danger or need immediate medical attention call 911.   Patient verbalizes understanding of instructions and care plan provided today and agrees to view in MyChart. Active MyChart status and patient understanding of how to access instructions and care plan via MyChart confirmed with patient.     Telephone follow up appointment with Managed Medicaid care management team member scheduled for: 09/25  Erik Santana, [place]/VBCI - [facility] Social Worker [phone]  Following is a copy of your plan of care:  There are no care plans that you recently modified to display for this patient.

## 2024-03-23 ENCOUNTER — Encounter: Admitting: Physical Medicine and Rehabilitation

## 2024-03-23 ENCOUNTER — Encounter: Payer: Self-pay | Admitting: Physical Medicine and Rehabilitation

## 2024-03-23 VITALS — BP 138/86 | HR 86 | Ht 75.0 in | Wt 214.4 lb

## 2024-03-23 DIAGNOSIS — F411 Generalized anxiety disorder: Secondary | ICD-10-CM

## 2024-03-23 DIAGNOSIS — R252 Cramp and spasm: Secondary | ICD-10-CM | POA: Diagnosis not present

## 2024-03-23 DIAGNOSIS — G8194 Hemiplegia, unspecified affecting left nondominant side: Secondary | ICD-10-CM

## 2024-03-23 MED ORDER — BUSPIRONE HCL 15 MG PO TABS: 15.0000 mg | ORAL_TABLET | Freq: Three times a day (TID) | ORAL | 2 refills | Status: DC

## 2024-03-23 MED ORDER — SODIUM CHLORIDE (PF) 0.9 % IJ SOLN
1.0000 mL | Freq: Once | INTRAMUSCULAR | Status: AC
  Administered 2024-03-23: 1 mL

## 2024-03-23 MED ORDER — ONABOTULINUMTOXINA 100 UNITS IJ SOLR
100.0000 [IU] | Freq: Once | INTRAMUSCULAR | Status: AC
  Administered 2024-03-23: 100 [IU] via INTRAMUSCULAR

## 2024-03-23 NOTE — Progress Notes (Signed)
 He is walking trails and using his prosthesis less; is noticing some rubbinmg on his foot from intoeing. Getting out of the house more, notes his anxiety will prevent him from leaving unless he takes valium .    Botolulinum Toxin Injection: [x]  BOTOX (onabotulinumtoxinA ) [_] DYSPORT (abobotulinumtoxinA) [_] Xeomin (Incobotulinum toxin A)  Goals with treatment: [ x] Decrease spasms/ abnormal movements [x ] Improve Active / Passive ROM [ ]  Improve ADLs [x ] Improve functional mobility [x ] Improve gait mechanics [x ] Improve positioning/posture [x ] Prevent contracture  [ ]  Prevent joint destruction [ ]  Prevent skin breakdown [ ]  Decrease caregiver burden [ ]  Improve hygiene [x ] Improve Pain  MEDICATION:  ONAbotulinum toxin. 100 U Units    NaCl 2 mL    CONSENT: Obtained in writing followed by time-out per policy. Consent uploaded to chart.  Benefits discussed included, but were not limited to, decreased muscle tightness and spasticity, increased joint range of motion, improved limb positioning and facilitation of hygiene and nursing care.   Risks discussed included, but were not limited to, pain and discomfort, bleeding, bruising, excessive weakness, venous thrombosis, muscle atrophy, and distant spread of toxin which could include generalized muscle weakness, diplopia, blurred vision, ptosis, dysphagia, dysphonia, dysarthria, urinary incontinence and breathing difficulties. These symptoms have been reported hours to weeks after injection. Swallowing and breathing difficulties may be life threatening, and there have been reports of death. Patient/Family member/Guardian/Caregiver have been offered botulinum toxin informational material upon initial consultation and this information has been continually available. All questions answered to patient/family member/guardian/ caregiver satisfaction. They would like to proceed with procedure. There are no noted contraindications to  procedure.  PROCEDURE [x]  Without Ultrasound: Patient was placed in a position with the appropriate muscles exposed, located and identified. The skin was cleaned with ChloraPrep and ethyl chloride was sprayed for topical anesthetic. Using combination EMG amplification and electrical stimulation, the following muscles were identified using anatomical landmarks described by Perotto, et al (1994) and injected following aspiration to ensure blood vessels were avoided.  [_ ] With Ultrasound: Patient was placed in a position with the appropriate muscles exposed, located and identified. The skin was cleaned with ChloraPrep and ethyl chloride was sprayed for topical anesthetic. Using combination Ultrasound guidance for anatomical guidance, avoidance of significant vasculature, to decrease the risk of hematoma formation and ensure botox was placed in the correct location; EMG amplification and electrical stimulation, the following muscles were identified and injected following aspiration to ensure blood vessels were avoided. A _linear transducer was used during the procedure. The botulinum toxin was visualized entering the appropriate musculature.   MUSCLE: Units /Sites   100 LLE:                         40 units medial gastrocnemius                         40 units lateral gastrocnemius                         20 units TP    200 units were injected without difficulty. No complications were encountered. The patient tolerated the procedure well. Wasted 0   PLAN: - Resume Usual Activities. Notify Physician of any unusual bleeding, erythema or concern for side effects as reviewed above. - Apply ice prn for pain - Tylenol  prn for pain - Follow up via MyChart message in  6-8 weeks to assess response to injection   - Discussed anxiety; established with neuropsych, continuiing to look for dedicated psychiatrist to manage medications. Buspar  refilled today.  - Discussed prosthesis adjustment with [employer] due to  rubbing/skin breakdown. Discussed daily monitoring of skin for developing nonblancheable areas.

## 2024-03-23 NOTE — Patient Instructions (Addendum)
-   Resume Usual Activities. Notify Physician of any unusual bleeding, erythema or concern for side effects as reviewed above. - Apply ice prn for pain - Tylenol  prn for pain - Follow up via MyChart message in 6-8 weeks to assess response to injection   - I refilled Buspar  15 mg tid for 3 months  - Reach out to [employer] about adjusting your brace for rubbing on your outter foot

## 2024-03-28 ENCOUNTER — Encounter: Attending: Physical Medicine and Rehabilitation | Admitting: Psychology

## 2024-03-28 DIAGNOSIS — R252 Cramp and spasm: Secondary | ICD-10-CM | POA: Insufficient documentation

## 2024-03-28 DIAGNOSIS — F411 Generalized anxiety disorder: Secondary | ICD-10-CM | POA: Diagnosis present

## 2024-03-28 DIAGNOSIS — F4389 Other reactions to severe stress: Secondary | ICD-10-CM | POA: Diagnosis not present

## 2024-03-28 DIAGNOSIS — I629 Nontraumatic intracranial hemorrhage, unspecified: Secondary | ICD-10-CM | POA: Diagnosis not present

## 2024-03-28 DIAGNOSIS — G8194 Hemiplegia, unspecified affecting left nondominant side: Secondary | ICD-10-CM | POA: Insufficient documentation

## 2024-04-03 ENCOUNTER — Telehealth: Admitting: Licensed Clinical Social Worker

## 2024-04-04 ENCOUNTER — Encounter: Admitting: Psychology

## 2024-04-04 DIAGNOSIS — F411 Generalized anxiety disorder: Secondary | ICD-10-CM | POA: Diagnosis not present

## 2024-04-04 DIAGNOSIS — F4389 Other reactions to severe stress: Secondary | ICD-10-CM | POA: Diagnosis not present

## 2024-04-04 DIAGNOSIS — I629 Nontraumatic intracranial hemorrhage, unspecified: Secondary | ICD-10-CM

## 2024-04-04 DIAGNOSIS — R252 Cramp and spasm: Secondary | ICD-10-CM | POA: Diagnosis not present

## 2024-04-05 ENCOUNTER — Other Ambulatory Visit: Payer: Self-pay

## 2024-04-05 NOTE — Patient Instructions (Signed)
 Visit Information  Mr. Police was given information about Medicaid Managed Care team care coordination services as a part of their [facility] Medicaid benefit.   If you would like to schedule transportation through your [facility] plan, please call the following number at least 2 days in advance of your appointment: [phone].   You can also use the MTM portal or MTM mobile app to manage your rides. Reimbursement for transportation is available through [facility] For the portal, please go to [email or web address]  Call the [facility] Crisis Line at [phone], at any time, 24 hours a day, 7 days a week. If you are in danger or need immediate medical attention call 911.  Please see education materials related to food resources and extra benefits for [employer] members provided by email.   Patient verbalizes understanding of instructions and care plan provided today and agrees to view in MyChart. Active MyChart status and patient understanding of how to access instructions and care plan via MyChart confirmed with patient.     Telephone follow up appointment with Managed Medicaid care management team member scheduled for: 04/24/2024 at 11AM  [name], [place]/VBCI - [facility] Social Worker [phone]   Following is a copy of your plan of care:  There are no care plans that you recently modified to display for this patient.

## 2024-04-05 NOTE — Patient Outreach (Signed)
 Complex Care Management   Visit Note  04/05/2024  Name:  Erik Santana. MRN: [id number] DOB: 1991-04-19  Situation: Referral received for Complex Care Management related to [facility] Extra benefits and food resources in [place] I obtained verbal consent from Patient.  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 Pt was alert and cognitive. Pt confirmed he is still working on getting his wellness benefit for a gym membership. Pt requested food resources in [place] as he sometimes stays in [place] with his grandfather. BSW provided resources for out of the garden food project and [employer]. In addition, BSW explained to pt he has OTC pharmacy benefit $10/month. OTC item catalong and instructions on how to call and place an order were provided to pt over the phone and sent via email. No additional resources were requested/provided at this time. Pt was informed of upcoming BSW leave and agreed to check in after BSW comes back.   SDOH Interventions    Flowsheet Row Patient Outreach Telephone from 02/08/2024 in [place] POPULATION HEALTH DEPARTMENT Patient Outreach from 12/22/2023 in [place] POPULATION HEALTH DEPARTMENT Patient Outreach Telephone from 11/29/2023 in [place] POPULATION HEALTH DEPARTMENT Office Visit from 11/09/2023 in [facility] at [facility]  SDOH Interventions      Food Insecurity Interventions Intervention Not Indicated Community Resources Provided [employer] Provided Intervention Not Indicated, AMB Referral  Housing Interventions Intervention Not Indicated Intervention Not Indicated Intervention Not Indicated Intervention Not Indicated  Transportation Interventions Payor Benefit  Intervention Not Indicated Intervention Not Indicated Intervention Not Indicated  Utilities Interventions Intervention Not Indicated Community Resources Provided  [employer] - additional benefits - house/utility allowance.] Intervention Not Indicated Intervention Not Indicated  Alcohol Usage Interventions -- -- -- Intervention Not Indicated (Score <7)  Financial Strain Interventions -- -- Programmer, applications Provided Intervention Not Indicated  Physical Activity Interventions -- -- -- Intervention Not Indicated  Stress Interventions -- -- -- Intervention Not Indicated  Social Connections Interventions -- -- -- Intervention Not Indicated  Health Literacy Interventions -- -- -- Intervention Not Indicated      Recommendation:   Review food resources and OTC benefit.   Follow Up Plan:   Telephone follow up appointment date/time:  04/24/2024 at 11AM   [name], [place]/VBCI - [facility] Social Worker [phone]

## 2024-04-12 ENCOUNTER — Encounter (HOSPITAL_BASED_OUTPATIENT_CLINIC_OR_DEPARTMENT_OTHER): Admitting: Psychology

## 2024-04-12 DIAGNOSIS — R252 Cramp and spasm: Secondary | ICD-10-CM | POA: Diagnosis not present

## 2024-04-12 DIAGNOSIS — F411 Generalized anxiety disorder: Secondary | ICD-10-CM | POA: Diagnosis not present

## 2024-04-12 DIAGNOSIS — I629 Nontraumatic intracranial hemorrhage, unspecified: Secondary | ICD-10-CM | POA: Diagnosis not present

## 2024-04-12 DIAGNOSIS — F4389 Other reactions to severe stress: Secondary | ICD-10-CM

## 2024-04-18 ENCOUNTER — Encounter: Admitting: Psychology

## 2024-04-18 ENCOUNTER — Other Ambulatory Visit: Payer: Self-pay | Admitting: Physical Medicine and Rehabilitation

## 2024-04-18 DIAGNOSIS — F4389 Other reactions to severe stress: Secondary | ICD-10-CM

## 2024-04-18 DIAGNOSIS — F411 Generalized anxiety disorder: Secondary | ICD-10-CM

## 2024-04-18 DIAGNOSIS — R29818 Other symptoms and signs involving the nervous system: Secondary | ICD-10-CM

## 2024-04-18 DIAGNOSIS — I629 Nontraumatic intracranial hemorrhage, unspecified: Secondary | ICD-10-CM

## 2024-04-18 DIAGNOSIS — R208 Other disturbances of skin sensation: Secondary | ICD-10-CM

## 2024-04-18 MED ORDER — LIDOCAINE 5 % EX PTCH: 2.0000 | MEDICATED_PATCH | CUTANEOUS | 5 refills | Status: DC

## 2024-04-19 ENCOUNTER — Telehealth: Admitting: Licensed Clinical Social Worker

## 2024-04-24 ENCOUNTER — Other Ambulatory Visit: Payer: Self-pay

## 2024-04-24 NOTE — Patient Outreach (Signed)
 Social Drivers of Health  Community Resource and Care Coordination Visit Note   04/24/2024  Name: Erik Santana. MRN: [id number] DOB:07-11-1990  Situation: Referral received for SDoH needs assessment and assistance related to Transportation Financial Strain  Food Insecurity  SSI application, dental needs, and connecting with recruitment consultant. I obtained verbal consent from Patient.  Visit completed with Patient on the phone.   Background:    Assessment: BSW held f/u appt with pt. Pt was alert and cognitive. Pt reports he received BSW email with resources and had no questions re resources. However, pt did make BSW aware he would like to see a dentist. BSW will provide pt via email a list of dental providers in the [place] area that accept his health insurance.Pt reports not hearing back from [facility] re his SSI claim.   BSW and pt called [facility] together and spoke with [name] [phone]. [phone]. [name] told pt over the phone they have not received an update re his SSI claim. Pt was instructed to call [employer] to follow up about his SSI claim at [phone]. Pt understood and agreed to do so.   Pt also states he believes his medicaid will be expiring at the end of the year and wants to know when he is up for recertification. BSW encouraged pt to reach out to local DSS office to request to speak with case worker. Pt understood and was provided with contact info. No other resources were provided/requested at this time.    Goals Addressed             This Visit's Progress    BSW VBCI Social Work Care Plan       Current SDOH Barriers:  Transportation Limited access to food SSI application Dental needs Connect with medicaid case worker  Interventions: Referred patient to community resources  Advised patient to to call local DSS office to connect with careers information officer re recertification date    [employer] with Disability Advocacy center (community agency) re: SSI application.           Recommendation:   attend all scheduled provider appointments call for transportation assistance at least one week before appointments ask for help if you don't understand your health insurance benefits Call DSS to speak with medicaid case worker.   Follow Up Plan:   Telephone follow up appointment date/time:  05/04/2024 at 1pm  [name], BSW [place]/VBCI - [facility] Social Worker [phone]

## 2024-04-24 NOTE — Patient Instructions (Signed)
 Visit Information  Mr. Delcarlo was given information about Medicaid Managed Care team care coordination services as a part of their [facility] Medicaid benefit.   If you would like to schedule transportation through your [facility] plan, please call the following number at least 2 days in advance of your appointment: [phone].   You can also use the MTM portal or MTM mobile app to manage your rides. Reimbursement for transportation is available through [facility]! For the portal, please go to [email or web address]  Call the [facility] Crisis Line at [phone], at any time, 24 hours a day, 7 days a week. If you are in danger or need immediate medical attention call 911.    Care plan and visit instructions communicated with the patient verbally today. Patient agrees to receive a copy in MyChart. Active MyChart status and patient understanding of how to access instructions and care plan via MyChart confirmed with patient.     Telephone follow up appointment with Managed Medicaid care management team member scheduled for: 05/04/2024 at 1pm  [name], [place]/VBCI - [facility] Social Worker [phone]   Following is a copy of your plan of care:  There are no care plans that you recently modified to display for this patient.

## 2024-04-26 ENCOUNTER — Other Ambulatory Visit: Payer: Self-pay | Admitting: Licensed Clinical Social Worker

## 2024-04-26 ENCOUNTER — Encounter (HOSPITAL_BASED_OUTPATIENT_CLINIC_OR_DEPARTMENT_OTHER): Admitting: Psychology

## 2024-04-26 DIAGNOSIS — F411 Generalized anxiety disorder: Secondary | ICD-10-CM

## 2024-04-26 DIAGNOSIS — F4389 Other reactions to severe stress: Secondary | ICD-10-CM

## 2024-04-26 DIAGNOSIS — I629 Nontraumatic intracranial hemorrhage, unspecified: Secondary | ICD-10-CM | POA: Diagnosis not present

## 2024-04-26 DIAGNOSIS — R252 Cramp and spasm: Secondary | ICD-10-CM | POA: Diagnosis not present

## 2024-04-28 ENCOUNTER — Other Ambulatory Visit: Payer: Self-pay | Admitting: Family

## 2024-04-28 DIAGNOSIS — J3089 Other allergic rhinitis: Secondary | ICD-10-CM

## 2024-04-30 ENCOUNTER — Telehealth: Payer: Self-pay

## 2024-04-30 NOTE — Telephone Encounter (Signed)
 Requested Prescriptions  Pending Prescriptions Disp Refills   loratadine  (CLARITIN ) 10 MG tablet [Pharmacy Med Name: LORATADINE  10 MG TABLET] 90 tablet 0    Sig: TAKE 1 TABLET BY MOUTH EVERY DAY     Ear, Nose, and Throat:  Antihistamines 2 Passed - 04/30/2024  3:08 PM      Passed - Cr in normal range and within 360 days    Creatinine, Ser  Date Value Ref Range Status  12/23/2023 0.82 0.76 - 1.27 mg/dL Final         Passed - Valid encounter within last 12 months    Recent Outpatient Visits           1 month ago Hyperlipidemia, unspecified hyperlipidemia type   [place] [facility], [place], NP   4 months ago Annual physical exam   [facility] at [facility], Amy J, NP   5 months ago Encounter to establish care   [facility] at [facility], Greig PARAS, NP

## 2024-04-30 NOTE — Telephone Encounter (Signed)
 PA for Lidocaine  Patches created.

## 2024-05-01 ENCOUNTER — Ambulatory Visit (INDEPENDENT_AMBULATORY_CARE_PROVIDER_SITE_OTHER)

## 2024-05-01 ENCOUNTER — Ambulatory Visit (INDEPENDENT_AMBULATORY_CARE_PROVIDER_SITE_OTHER): Admitting: Family

## 2024-05-01 ENCOUNTER — Encounter: Payer: Self-pay | Admitting: Family

## 2024-05-01 ENCOUNTER — Other Ambulatory Visit: Payer: Self-pay | Admitting: Family

## 2024-05-01 ENCOUNTER — Ambulatory Visit: Payer: Self-pay | Admitting: Family

## 2024-05-01 VITALS — BP 133/86 | HR 76 | Temp 99.7°F | Resp 16 | Ht 75.0 in | Wt 217.6 lb

## 2024-05-01 DIAGNOSIS — N62 Hypertrophy of breast: Secondary | ICD-10-CM

## 2024-05-01 DIAGNOSIS — L659 Nonscarring hair loss, unspecified: Secondary | ICD-10-CM | POA: Diagnosis not present

## 2024-05-01 DIAGNOSIS — R9431 Abnormal electrocardiogram [ECG] [EKG]: Secondary | ICD-10-CM

## 2024-05-01 DIAGNOSIS — R0789 Other chest pain: Secondary | ICD-10-CM | POA: Diagnosis not present

## 2024-05-01 DIAGNOSIS — E785 Hyperlipidemia, unspecified: Secondary | ICD-10-CM

## 2024-05-01 MED ORDER — ATORVASTATIN CALCIUM 40 MG PO TABS: 40.0000 mg | ORAL_TABLET | Freq: Every day | ORAL | 0 refills | Status: AC

## 2024-05-01 NOTE — Progress Notes (Signed)
 Chest swelling,  patient needs a sooner appointment with dermatologist  patient scalp is really dry and spells like he is getting a perm, needs blood work patient is not taking kepra , patient scored a 21 on GAD-7

## 2024-05-01 NOTE — Progress Notes (Signed)
 Patient ID: Erik Santana., male    DOB: 1991-06-14  MRN: [id number]  CC: Chronic Conditions Follow-Up  Subjective: Erik Santana is a 33 y.o. male who presents for chronic conditions follow-up.   His concerns today include:  - Doing well on Atorvastatin , no issues/concerns.  - Hair loss. States receding hairline. States he changed his shampoo which may be contributing to his hair smelling like a perm after washing.  - Chest discomfort. Denies red flag symptoms.  - States left chest is larger than right chest. Denies red flag symptoms. - Established with Neurology. States Neurology told him to quit taking Keppra  because he really doesn't have a history of seizures and that it was actually panic attacks. Patient aware to keep all scheduled appointments with established Neurology for follow-up including labs.  - Established with Psychiatry. He denies thoughts of self-harm, suicidal ideations, homicidal ideations.  Patient Active Problem List   Diagnosis Date Noted   Adjustment disorder with mixed anxiety and depressed mood 02/17/2024   Gastroesophageal reflux disease 02/08/2024   Seizure-like activity (HCC) 12/16/2023   PTSD (post-traumatic stress disorder) 12/07/2023   Left hemiparesis (HCC) 12/07/2023   Spasticity 12/07/2023   Emotional lability 10/05/2023   Left foot drop 10/05/2023   Cognitive change 08/30/2023   Intraparenchymal hematoma of brain (HCC) 08/24/2023   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   Generalized anxiety disorder 10/27/2012   Hematemesis 10/18/2012   Other esophagitis 10/18/2012   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      busPIRone  (BUSPAR ) 15 MG tablet Take 1 tablet (15 mg total) by mouth 3 (three) times daily. 90 tablet 2   cetirizine  (ZYRTEC ) 10 MG tablet Take 1 tablet (10 mg total) by mouth daily. 90 tablet 0   diazepam  (VALIUM ) 2 MG tablet Take 0.5-1 tablets (1-2 mg total) by mouth every 12 (twelve) hours as needed for anxiety or muscle spasms. 60 tablet 2   lidocaine  (LIDODERM ) 5 % Place 2 patches onto the skin daily. Remove & Discard patch within 12 hours or as directed by MD 60 patch 5   loratadine  (CLARITIN ) 10 MG tablet TAKE 1 TABLET BY MOUTH EVERY DAY 90 tablet 0   meclizine  (ANTIVERT ) 25 MG tablet Take 1 tablet (25 mg total) by mouth 3 (three) times daily as needed for dizziness. 30 tablet 3   pantoprazole  (PROTONIX ) 20 MG tablet Take 1 tablet (20 mg total) by mouth daily. 90 tablet 1   QUEtiapine  (SEROQUEL ) 25 MG tablet Take 1 tablet (25 mg total) by mouth at bedtime as needed (sleep). 60 tablet 0   traZODone  (DESYREL ) 50 MG tablet Take 1 tablet (50 mg total) by mouth at bedtime. 90 tablet 1   atorvastatin  (LIPITOR) 20 MG tablet Take 1 tablet (20 mg total) by mouth daily. (Patient not taking: Reported on 05/01/2024) 90 tablet 0   No current facility-administered medications on file prior to visit.    Allergies  Allergen Reactions   Latex Hives   Nsaids Other (See Comments)    History of a Mallory-Weiss tear (had surgery to fix this 10+ years ago)    Social History   Socioeconomic History  Marital status: Single    Spouse name: Not on file   Number of children: Not on file   Years of education: Not on file   Highest education level: Not on file  Occupational History   Not on file  Tobacco Use   Smoking status: Former    Current packs/day: 0.50    Types: Cigarettes   Smokeless tobacco: Never  Vaping Use   Vaping status: Former  Substance and Sexual Activity   Alcohol use: Not Currently    Comment: occ   Drug use: No   Sexual activity: Not on file  Other Topics Concern   Not on  file  Social History Narrative   Not on file   Social Drivers of Health   Financial Resource Strain: Medium Risk (11/29/2023)   Overall Financial Resource Strain (CARDIA)    Difficulty of Paying Living Expenses: Somewhat hard  Food Insecurity: No Food Insecurity (02/08/2024)   Hunger Vital Sign    Worried About Running Out of Food in the Last Year: Never true    Ran Out of Food in the Last Year: Never true  Recent Concern: Food Insecurity - Food Insecurity Present (12/22/2023)   Hunger Vital Sign    Worried About Running Out of Food in the Last Year: Sometimes true    Ran Out of Food in the Last Year: Sometimes true  Transportation Needs: Unmet Transportation Needs (02/08/2024)   PRAPARE - Transportation    Lack of Transportation (Medical): Yes    Lack of Transportation (Non-Medical): Yes  Physical Activity: Sufficiently Active (11/09/2023)   Exercise Vital Sign    Days of Exercise per Week: 5 days    Minutes of Exercise per Session: 30 min  Stress: No Stress Concern Present (11/09/2023)   [employer] of Occupational Health - Occupational Stress Questionnaire    Feeling of Stress : Only a little  Social Connections: Socially Isolated (11/09/2023)   Social Connection and Isolation Panel    Frequency of Communication with Friends and Family: More than three times a week    Frequency of Social Gatherings with Friends and Family: More than three times a week    Attends Religious Services: Never    Database Administrator or Organizations: No    Attends Banker Meetings: Never    Marital Status: Never married  Intimate Partner Violence: Not At Risk (02/08/2024)   Humiliation, Afraid, Rape, and Kick questionnaire    Fear of Current or Ex-Partner: No    Emotionally Abused: No    Physically Abused: No    Sexually Abused: No    Family History  Problem Relation Age of Onset   Healthy Mother    Healthy Father     Past Surgical History:  Procedure Laterality Date    ESOPHAGOGASTRODUODENOSCOPY N/A 10/18/2012   Procedure: ESOPHAGOGASTRODUODENOSCOPY (EGD);  Surgeon: Lamar JONETTA Aho, MD;  Location: [facility];  Service: Endoscopy;  Laterality: N/A;   ESOPHAGOGASTRODUODENOSCOPY (EGD) WITH PROPOFOL  N/A 10/18/2012   Procedure: ESOPHAGOGASTRODUODENOSCOPY (EGD) WITH PROPOFOL ;  Surgeon: Lamar JONETTA Aho, MD;  Location: [facility];  Service: Endoscopy;  Laterality: N/A;   FINGER SURGERY     IR ANGIO INTRA EXTRACRAN SEL COM CAROTID INNOMINATE BILAT MOD SED  08/23/2023   IR ANGIO VERTEBRAL SEL SUBCLAVIAN INNOMINATE UNI L MOD SED  08/23/2023   IR ANGIO VERTEBRAL SEL VERTEBRAL UNI R MOD SED  08/23/2023   OPEN REDUCTION INTERNAL FIXATION (ORIF) PROXIMAL PHALANX Right 10/12/2022   Procedure: Right ring finger middle  phalanx and proximal interphalangeal joint closed reduction internal percutaneous pinning;  Surgeon: Alyse Agent, MD;  Location: [facility];  Service: Orthopedics;  Laterality: Right;    ROS: Review of Systems Negative except as stated above  PHYSICAL EXAM: BP 133/86   Pulse 76   Temp 99.7 F (37.6 C) (Oral)   Resp 16   Ht 6' 3 (1.905 m)   Wt 217 lb 9.6 oz (98.7 kg)   SpO2 94%   BMI 27.20 kg/m   Physical Exam HENT:     Head: Normocephalic and atraumatic.     Nose: Nose normal.     Mouth/Throat:     Mouth: Mucous membranes are moist.     Pharynx: Oropharynx is clear.  Eyes:     Extraocular Movements: Extraocular movements intact.     Conjunctiva/sclera: Conjunctivae normal.     Pupils: Pupils are equal, round, and reactive to light.  Cardiovascular:     Rate and Rhythm: Normal rate and regular rhythm.     Pulses: Normal pulses.     Heart sounds: Normal heart sounds.  Pulmonary:     Effort: Pulmonary effort is normal.     Breath sounds: Normal breath sounds.  Chest:     Comments: Gynecomastia left chest, no drainage. Musculoskeletal:        General: Normal range of motion.     Cervical back: Normal range of motion and  neck supple.  Neurological:     General: No focal deficit present.     Mental Status: He is alert and oriented to person, place, and time.  Psychiatric:        Mood and Affect: Mood normal.        Behavior: Behavior normal.    ASSESSMENT AND PLAN: 1. Hyperlipidemia, unspecified hyperlipidemia type (Primary) - Continue Atorvastatin  as prescribed. Counseled on medication adherence/adverse effects.  - Routine screening.  - Follow-up with primary provider as scheduled.  - Lipid panel - atorvastatin  (LIPITOR) 40 MG tablet; Take 1 tablet (40 mg total) by mouth daily.  Dispense: 90 tablet; Refill: 0  2. Hair loss - Referral to Dermatology for evaluation/management.  - Ambulatory referral to Dermatology  3. Chest discomfort - Patient today in office with no cardiopulmonary/acute distress.  - EKG and DG Chest 2 View for evaluation. - Routine screening.  - Referral to Cardiology for evaluation/management.  - Follow-up with primary provider as scheduled.  - EKG 12-Lead - Basic Metabolic Panel - CBC - DG Chest 2 View; Future - Ambulatory referral to Cardiology  4. Gynecomastia, male - Routine screening.  - MM Digital Diagnostic Bilat; Future - MM Digital Screening; Future    Patient was given the opportunity to ask questions.  Patient verbalized understanding of the plan and was able to repeat key elements of the plan. Patient was given clear instructions to go to Emergency Department or return to medical center if symptoms don't improve, worsen, or new problems develop.The patient verbalized understanding.   Orders Placed This Encounter  Procedures   MM Digital Diagnostic Bilat   MM Digital Screening   DG Chest 2 View   Lipid panel   Basic Metabolic Panel   CBC   Ambulatory referral to Dermatology   Ambulatory referral to Cardiology   EKG 12-Lead     Requested Prescriptions   Signed Prescriptions Disp Refills   atorvastatin  (LIPITOR) 40 MG tablet 90 tablet 0    Sig:  Take 1 tablet (40 mg total) by mouth daily.  Return for Follow-Up or next available chronic conditions.  Greig JINNY Drones, NP

## 2024-05-02 ENCOUNTER — Encounter: Attending: Physical Medicine and Rehabilitation | Admitting: Psychology

## 2024-05-02 DIAGNOSIS — F411 Generalized anxiety disorder: Secondary | ICD-10-CM | POA: Insufficient documentation

## 2024-05-02 DIAGNOSIS — F4310 Post-traumatic stress disorder, unspecified: Secondary | ICD-10-CM

## 2024-05-02 DIAGNOSIS — I629 Nontraumatic intracranial hemorrhage, unspecified: Secondary | ICD-10-CM | POA: Diagnosis present

## 2024-05-02 DIAGNOSIS — F4389 Other reactions to severe stress: Secondary | ICD-10-CM | POA: Diagnosis present

## 2024-05-02 LAB — LIPID PANEL
Chol/HDL Ratio: 3.7 ratio (ref 0.0–5.0)
Cholesterol, Total: 140 mg/dL (ref 100–199)
HDL: 38 mg/dL — ABNORMAL LOW (ref >39)
LDL Chol Calc (NIH): 78 mg/dL (ref 0–99)
Triglycerides: 136 mg/dL (ref 0–149)
VLDL Cholesterol Cal: 24 mg/dL (ref 5–40)

## 2024-05-02 LAB — BASIC METABOLIC PANEL WITH GFR
BUN/Creatinine Ratio: 8 — ABNORMAL LOW (ref 9–20)
BUN: 9 mg/dL (ref 6–20)
CO2: 23 mmol/L (ref 20–29)
Calcium: 10.4 mg/dL — ABNORMAL HIGH (ref 8.7–10.2)
Chloride: 99 mmol/L (ref 96–106)
Creatinine, Ser: 1.1 mg/dL (ref 0.76–1.27)
Glucose: 93 mg/dL (ref 70–99)
Potassium: 4.7 mmol/L (ref 3.5–5.2)
Sodium: 139 mmol/L (ref 134–144)
eGFR: 92 mL/min/1.73 (ref >59)

## 2024-05-02 LAB — CBC
Hematocrit: 51.4 % — ABNORMAL HIGH (ref 37.5–51.0)
Hemoglobin: 17.2 g/dL (ref 13.0–17.7)
MCH: 28.7 pg (ref 26.6–33.0)
MCHC: 33.5 g/dL (ref 31.5–35.7)
MCV: 86 fL (ref 79–97)
Platelets: 359 x10E3/uL (ref 150–450)
RBC: 6 x10E6/uL — ABNORMAL HIGH (ref 4.14–5.80)
RDW: 13 % (ref 11.6–15.4)
WBC: 6.3 x10E3/uL (ref 3.4–10.8)

## 2024-05-02 NOTE — Telephone Encounter (Signed)
 Lidocaine  5% patch has been denied.  Denial on Dr. Lord desk.  '

## 2024-05-02 NOTE — Progress Notes (Incomplete)
 Erik Santana

## 2024-05-02 NOTE — Progress Notes (Deleted)
 Neuropsychology / Psychotherapy Note  Jolynn DEL. [facility]  Physical Medicine and Rehabilitation    Patient: Erik Santana  DOB: 23-Dec-1990  MRN: [id number]  Provider: Evalene DOROTHA Riff, PsyD Modality & Individuals Present: The patient was seen in-person, unaccompanied, by the provider in the PM&R outpatient clinic office.  Location: [facility] [address], [address] [place] [address]  Date of Service: 03/02/2024 Duration of Service:  55 min Record Review & Presenting Concerns (Per 02/23/24 intake):The patient is a 33 year old male referred for psychotherapy.  The patient has a past medical history, per records, of PTSD, ADHD, GAD. Hospital medical records (08/24/23) indicated the patient presented with left sided weakness, NIH of 7 on admission, and subsequent CT of the head showed  CT head showed ICH in right fronto parietal region likely to be embolic in nature. MRI showed unchanged appearance of intraparenchymal hematoma within the superior right frontal lobe. No mass lesion. Echo showed LVEF 55-60%, Patient underwent cerebral angiogram which showed no gross abnormalities identified in terms of aneurysms, dural AV fistula, A/V shunting, dissections, or  intraluminal filling defects.Dural venous sinuses grossly patent. Neurology recommended no antithrombotic due to acute ICH. Patient cleared for discharge to acute inpatient rehab.Neurology signed off. 09/22/2023 PM&R HPI indicated ... 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  Presented 08/21/2023 with acute onset of left-sided weakness as well as dizziness and headache.  He was able to call EMS himself.  EMS found him on the floor... CT of the head showed a 3.9 x 2.4 x 4.1 cm parenchymal hemorrhage centered at the right frontal parietal junction.  No evidence of intraventricular extension or mass effect.  CT cervical spine negative.   CTA showed no intracranial large vessel occlusion or significant stenosis...MRI follow-up showed unchanged appearance of intraparenchymal hematoma within the superior right frontal lobe.  No mass lesion.  Arteriogram completed showing no gross abnormalities... [name] added for tobacco use.  Blood pressure controlled he has not required Cleviprex .  Therapy evaluations completed due to patient decreased functional mobility was admitted for a comprehensive rehab program. The patient met with Psychiatry with [facility] to establish care on 01/04/2024. Visit notes indicate the patient had been on Lexapro  but discontinued due to side effects. He had been off medications excluding muscle relaxer and buspirone  for about one month. Difficulties with panic/anxiety when in public settings became prominent. The patient reached out to PM&R for assistance with medications following that.  Visit notes from his PM&R visit on 02/08/2024 indicated he was started on trazodone  to help with sleep.  Prazosin  was discontinued due to dreams.  Valium  was resumed.  In notes indicated the physiatrist is reaching out to disability specialist for paperwork needs for his application.  Visit notes indicated spasticity and GERD were also present.   The patient presents with premorbid ADHD, anxiety, and history of trauma. 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 Difficulties with anxiety are prominent, and there is some concern for PTSD necessitating closer assessment. The patient demonstrates excellent insight into his difficulties and descriptions suggest strong ability for perspective taking, but linked to complex and challenging environment growing up. The patient is likely to benefit from individual therapy integrating  mindfulness/relaxation strategies, along with EF cognitive rehab strategies if needed. Trauma treatment  may be beneficial, and emotional suppression/avoidance may be factors exacerbating symptoms and warrant attention as well. Current session focused on obtaining background information and diagnostic information as well as establishing rapport and clarifying patient goals for treatment. Emotion regulation is primary focus, particularly with management of anxiety (cognitive and physiological Sx). Adjustment to injury work and more concrete goal setting (finding purpose/direction). Increased activity levels. 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  Adjustment to injury & Cognitive - Psychoeducation - Goal setting - Cog Rehab / Compensatory strategies PTSD/Adjustment disorder(?) - Evaluate to clarify differential - Psychoeducation - CPT if indicated.  Anxiety/Panic - Psychoeducation - CBT - Relaxation/mindfulness - Behavioral ADHD? - Evaluate - Coordinate with Emeline or psychiatry as needed.   Session Content: Goals focused on this session: Establish/build rapport, Pt goals, additional background      Themes/Topics Discussed: -Pt goals - being able to work again; focus on personal growth; clarify direction and purpose; learn how to put myself first and build more healthy relationships for myself that help me be the person I want to be. -Aspects of upbringing.  -Difficulties with emotion regulation.  -Onset of worrying/rumination. Never present before.   -Prominent psych issues involve anxiety and panic attacks.  -Previously emotionally  avoidant, possibly.  -Previously Dx with adderall and benzo during youth. Dx ADHD.  -Employment in Public Affairs Consultant Interventions and Techniques Used: Empathic support, psychoeducation Behavioral/Response to Intervention/Barriers to Progression:  Alertness/Attention: Alert. Easily side-tracked/tangential.  Orientation: Intact Language: Speech prosodic, fluent, well articulated. Rate slightly increased. No indications of psychosis.  Nonverbal communication - bit anxious.  Motor/Sensory: Ambulated independently and without issue.  Mood/Affect: Mood slightly anxious, but good. Affect congruent.  Interaction: Cooperative, engaged. Genuine.  Cognition: Tangential, anxious.  Emotional Processing: - Progress: Rapport built rapidly Barriers: Difficulties with attention/concentration.    Suicide/Homicide/Aggression Risk: No SI or HI. No history of attempt or ideation. No alcohol use. No recent deaths of loved ones.    Goals for Next Session: -Assess wellness / lifestyle factors -Self care -Cognitive Sx -Personal goals to work towards  Evalene DOROTHA Riff, PsyD Clinical Neuropsychology [address] [place], [place] [address] Main: [phone] Fax: [phone]   [address]: [phone]   This report was generated using voice recognition software. While this document has been carefully reviewed, transcription errors may be present. I apologize in advance for any inconvenience. Please contact me if further clarification is needed.

## 2024-05-04 ENCOUNTER — Encounter: Payer: Self-pay | Admitting: Diagnostic Neuroimaging

## 2024-05-04 ENCOUNTER — Other Ambulatory Visit: Payer: Self-pay

## 2024-05-04 DIAGNOSIS — R208 Other disturbances of skin sensation: Secondary | ICD-10-CM | POA: Insufficient documentation

## 2024-05-04 DIAGNOSIS — R29818 Other symptoms and signs involving the nervous system: Secondary | ICD-10-CM | POA: Insufficient documentation

## 2024-05-04 MED ORDER — LIDOCAINE 5 % EX PTCH: 2.0000 | MEDICATED_PATCH | CUTANEOUS | 5 refills | Status: AC

## 2024-05-04 NOTE — Patient Outreach (Signed)
 Social Drivers of Health  Community Resource and Care Coordination Visit Note   05/04/2024  Name: Erik Santana. MRN: [id number] DOB:April 29, 1991  Situation: Referral received for [facility] needs assessment and assistance related to Transportation dental need, . I obtained verbal consent from Patient.  Visit completed with Patient on the phone.   Background:   SDOH Interventions Today    Flowsheet Row Most Recent Value  SDOH Interventions   Food Insecurity Interventions Community Resources Provided  Maria Antonia and december out of the garden food schedule shared via email.]  Housing Interventions Intervention Not Indicated  Transportation Interventions Patient Resources (Friends/Family), Payor Benefit  Utilities Interventions Intervention Not Indicated     Assessment:   Goals Addressed             This Visit's Progress    BSW goal       Current SDOH Barriers:  Transportation Limited access to food SSI claim Dental need FNS re certification Physical [facility] - [facility]  Interventions: Patient interviewed and appropriate screenings performed Referred patient to community resources  Provided patient with information about out of the garden food project. Advised patient to complete FNS recertification ASAP and turn in to DSS before 11/15 to avoid interruption in FNS benefit past the 11/30. Advised pt he can renew online as well and encouraged him to reach out to caseworker [name] for assistance. Pt is seeking physical therapy through [facility] - on wait list for several weeks now.  Provided pt with list of dental providers.        Recommendation:   attend all scheduled provider appointments call for transportation assistance at least one week before appointments ask for help if you don't understand your health insurance benefits call and/or follow up with out of the garden food project for food assistance Complete FNS recertification before deadline  11/15.  Follow Up Plan:   Telephone follow up appointment date/time:  05/08/2024 at 9:30AM.  [name], BSW Vera/VBCI - [facility] Social Worker [phone]

## 2024-05-04 NOTE — Addendum Note (Signed)
 Addended by: EMELINE SEARCH on: 05/04/2024 03:12 PM   Modules accepted: Orders

## 2024-05-04 NOTE — Patient Outreach (Signed)
 Complex Care Management   Visit Note  04/26/2024  Name:  Erik Santana. MRN: [id number] DOB: [birth year]  Situation: Referral received for Complex Care Management related to Mental/Behavioral Health diagnosis GAD/PTSD I obtained verbal consent from Patient.  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    Assessment: Patient Reported Symptoms:  Cognitive Cognitive Status: No symptoms reported, Alert and oriented to person, place, and time, Normal speech and language skills Cognitive/Intellectual Conditions Management [RPT]: Brain Injury   Health Maintenance Behaviors: Annual physical exam  Neurological Neurological Review of Symptoms: No symptoms reported Neurological Management Strategies: Routine screening Neurological Comment: Patient was recently cleared to drive  HEENT HEENT Symptoms Reported: No symptoms reported      Cardiovascular Cardiovascular Symptoms Reported: No symptoms reported    Respiratory Respiratory Symptoms Reported: No symptoms reported    Endocrine Endocrine Symptoms Reported: No symptoms reported    Gastrointestinal Gastrointestinal Symptoms Reported: No symptoms reported      Genitourinary Genitourinary Symptoms Reported: No symptoms reported    Integumentary Integumentary Symptoms Reported: No symptoms reported    Musculoskeletal Musculoskelatal Symptoms Reviewed: No symptoms reported        Psychosocial Psychosocial Symptoms Reported: Anxiety - if selected complete GAD Additional Psychological Details: Pt continues to participate in med management and therapy Behavioral Management Strategies: Coping strategies, Adequate rest, Counseling, Exercise, Support system, Medication therapy Major Change/Loss/Stressor/Fears (CP): Medical condition, self Techniques  to Cope with Loss/Stress/Change: Diversional activities, Medication      05/04/2024    PHQ2-9 Depression Screening   Little interest or pleasure in doing things    Feeling down, depressed, or hopeless    PHQ-2 - Total Score    Trouble falling or staying asleep, or sleeping too much    Feeling tired or having little energy    Poor appetite or overeating     Feeling bad about yourself - or that you are a failure or have let yourself or your family down    Trouble concentrating on things, such as reading the newspaper or watching television    Moving or speaking so slowly that other people could have noticed.  Or the opposite - being so fidgety or restless that you have been moving around a lot more than usual    Thoughts that you would be better off dead, or hurting yourself in some way    PHQ2-9 Total Score    If you checked off any problems, how difficult have these problems made it for you to do your work, take care of things at home, or get along with other people    Depression Interventions/Treatment      There were no vitals filed for this visit.  Medications Reviewed Today     Reviewed by Ezzard Rolin BIRCH, LCSW (Social Worker) on 05/04/24 at 0550  Med List Status: <None>   Medication Order Taking? Sig Documenting Provider Last Dose Status Informant  acetaminophen  (TYLENOL ) 325 MG tablet [id number]  Take 1-2 tablets (325-650 mg total) by mouth every 6 (six) hours as needed for mild pain (pain score 1-3), headache or moderate pain (pain score 4-6) (325 mg for pain 1-3, 6500 mg for pain 4-6). Pegge Toribio PARAS, PA-C  Active Self, Pharmacy Records  atorvastatin  (LIPITOR) 20 MG tablet [id number]  Take 1  tablet (20 mg total) by mouth daily.  Patient not taking: Reported on 05/01/2024   Jaycee Greig PARAS, NP  Active   atorvastatin  (LIPITOR) 40 MG tablet [id number]  Take 1 tablet (40 mg total) by mouth daily. Jaycee Greig PARAS, NP  Active   busPIRone  (BUSPAR ) 15 MG tablet [id number]  Take 1 tablet  (15 mg total) by mouth 3 (three) times daily. Emeline Joesph BROCKS, DO  Active   cetirizine  (ZYRTEC ) 10 MG tablet [id number]  Take 1 tablet (10 mg total) by mouth daily. Jaycee Greig PARAS, NP  Active   diazepam  (VALIUM ) 2 MG tablet [id number]  Take 0.5-1 tablets (1-2 mg total) by mouth every 12 (twelve) hours as needed for anxiety or muscle spasms. Emeline Joesph C, DO  Active   lidocaine  (LIDODERM ) 5 % [id number]  Place 2 patches onto the skin daily. Remove & Discard patch within 12 hours or as directed by MD Emeline Joesph BROCKS, DO  Active   loratadine  (CLARITIN ) 10 MG tablet [id number]  TAKE 1 TABLET BY MOUTH EVERY DAY Jaycee Greig PARAS, NP  Active   meclizine  (ANTIVERT ) 25 MG tablet [id number]  Take 1 tablet (25 mg total) by mouth 3 (three) times daily as needed for dizziness. Emeline Joesph BROCKS, DO  Active Self, Pharmacy Records  pantoprazole  (PROTONIX ) 20 MG tablet [id number]  Take 1 tablet (20 mg total) by mouth daily. Emeline Joesph BROCKS, DO  Active   QUEtiapine  (SEROQUEL ) 25 MG tablet [id number]  Take 1 tablet (25 mg total) by mouth at bedtime as needed (sleep). Emeline Joesph BROCKS, DO  Active   traZODone  (DESYREL ) 50 MG tablet [id number]  Take 1 tablet (50 mg total) by mouth at bedtime. Emeline Joesph BROCKS, DO  Active             Recommendation:   Continue Current Plan of Care  Follow Up Plan:   Telephone follow-up in 1 month  [name], LCSW [facility]  [facility], [facility] Clinical Social Worker Direct Dial: [phone]  Fax: [phone] Website: [email or web address] 6:01 AM

## 2024-05-04 NOTE — Telephone Encounter (Signed)
 Received denial of medication request for Lidoderm  patches.  Appeal letter written, prescription resubmitted to the pharmacy for reconsideration.

## 2024-05-04 NOTE — Patient Instructions (Signed)
 Visit Information  Mr. Winkles was given information about Medicaid Managed Care team care coordination services as a part of their [facility] Medicaid benefit.   If you would like to schedule transportation through your [facility] plan, please call the following number at least 2 days in advance of your appointment: [phone].   You can also use the MTM portal or MTM mobile app to manage your rides. Reimbursement for transportation is available through [facility]! For the portal, please go to [email or web address]  Call the [facility] Crisis Line at [phone], at any time, 24 hours a day, 7 days a week. If you are in danger or need immediate medical attention call 911.  Please see education materials related to topics discussed provided by MyChart link.  Care plan and visit instructions communicated with the patient verbally today. Patient agrees to receive a copy in MyChart. Active MyChart status and patient understanding of how to access instructions and care plan via MyChart confirmed with patient.     Licensed Clinical Social Worker will call you on 12/5 at 11:30 AM  [name], LCSW [place]  [facility], [facility] Clinical Social Worker Direct Dial: [phone]  Fax: [phone] Website: [email or web address] 6:03 AM   Following is a copy of your plan of care:  There are no care plans that you recently modified to display for this patient.

## 2024-05-04 NOTE — Patient Instructions (Signed)
 Visit Information  Mr. Spadafore was given information about Medicaid Managed Care team care coordination services as a part of their [facility] Medicaid benefit.   If you would like to schedule transportation through your [facility] plan, please call the following number at least 2 days in advance of your appointment: [phone].   You can also use the MTM portal or MTM mobile app to manage your rides. Reimbursement for transportation is available through [facility]! For the portal, please go to [email or web address]  Call the [facility] Crisis Line at [phone], at any time, 24 hours a day, 7 days a week. If you are in danger or need immediate medical attention call 911.  Please see education materials related to food resources provided by email.   Care plan and visit instructions communicated with the patient verbally today. Patient agrees to receive a copy in MyChart. Active MyChart status and patient understanding of how to access instructions and care plan via MyChart confirmed with patient.     Telephone follow up appointment with Managed Medicaid care management team member scheduled for: 05/08/2024 at 9:30AM.  [name], BSW [place]/VBCI - [facility] Social Worker [phone]   Following is a copy of your plan of care:  There are no care plans that you recently modified to display for this patient.

## 2024-05-05 DIAGNOSIS — N62 Hypertrophy of breast: Secondary | ICD-10-CM | POA: Insufficient documentation

## 2024-05-05 DIAGNOSIS — N50819 Testicular pain, unspecified: Secondary | ICD-10-CM | POA: Insufficient documentation

## 2024-05-05 DIAGNOSIS — R0789 Other chest pain: Secondary | ICD-10-CM | POA: Insufficient documentation

## 2024-05-05 NOTE — Addendum Note (Signed)
 Addended by: EMELINE SEARCH on: 05/05/2024 12:29 AM   Modules accepted: Orders

## 2024-05-07 MED ORDER — LEVETIRACETAM 500 MG PO TABS: 500.0000 mg | ORAL_TABLET | Freq: Two times a day (BID) | ORAL | 4 refills | Status: AC

## 2024-05-07 NOTE — Telephone Encounter (Signed)
 Pt has called and scheduled his 6 mo f/u

## 2024-05-08 ENCOUNTER — Telehealth: Payer: Self-pay

## 2024-05-08 ENCOUNTER — Other Ambulatory Visit: Payer: Self-pay

## 2024-05-08 ENCOUNTER — Telehealth: Payer: Self-pay | Admitting: Physical Medicine and Rehabilitation

## 2024-05-08 NOTE — Telephone Encounter (Signed)
 Called back, ordered with doppler

## 2024-05-08 NOTE — Telephone Encounter (Signed)
 [place] with [facility] needs a call back in reference to Ultrasound of scrotum for patient--she would like to know if it is with or without doppler.  Please call her at [phone] press option 1 then option 5.

## 2024-05-08 NOTE — Patient Instructions (Signed)
 Visit Information  Erik Santana was given information about Medicaid Managed Care team care coordination services as a part of their [facility] Medicaid benefit.   If you would like to schedule transportation through your [facility] plan, please call the following number at least 2 days in advance of your appointment: [phone].   You can also use the MTM portal or MTM mobile app to manage your rides. Reimbursement for transportation is available through [facility]! For the portal, please go to [email or web address]  Call the [facility] Crisis Line at [phone], at any time, 24 hours a day, 7 days a week. If you are in danger or need immediate medical attention call 911.   Care plan and visit instructions communicated with the patient verbally today. Patient agrees to receive a copy in MyChart. Active MyChart status and patient understanding of how to access instructions and care plan via MyChart confirmed with patient.     Telephone follow up appointment with Managed Medicaid care management team member scheduled for: 05/22/2024 at 11:30AM.  [name], BSW [place]/VBCI - [facility] Social Worker [phone]   Following is a copy of your plan of care:  There are no care plans that you recently modified to display for this patient.

## 2024-05-08 NOTE — Patient Outreach (Signed)
 Social Drivers of Health  Community Resource and Care Coordination Visit Note   05/08/2024  Name: Erik Santana. MRN: [id number] DOB:07-21-1990  Situation: Referral received for [facility] needs assessment and assistance related to Transportation. I obtained verbal consent from Patient.  Visit completed with Patient on the phone.   Background:   SDOH Interventions Today    Flowsheet Row Most Recent Value  SDOH Interventions   Transportation Interventions Patient Resources (Friends/Family), Payor Benefit  [provided patient [name] medicaid transportation phone number and encouraged him to call 2 days in advance for transportation.]     Assessment: Pt reports he completed his FNS recertification over-the-phone with his DSS case worker. Pt also reports back on his seizure meds since he has been experiencing seizures recently. Pt reports he let his neuro doctor know as well. BSW reviewed upcoming appts with patient and encouraged him to call [facility] transportation for his medical appt on 11/13. BSW provided transportation phone line and reminded pt he has until today to schedule transportation within the allotted time window for scheduling. Pt understood.    Goals Addressed             This Visit's Progress    BSW goal   On track    Current SDOH Barriers:  Transportation Limited access to food SSI claim Dental need FNS re certification Physical therapy - [facility]  Interventions: Patient interviewed and appropriate screenings performed Referred patient to community resources  Provided patient with information about out of the garden food project. Advised patient to complete FNS recertification ASAP and turn in to DSS before 11/15 to avoid interruption in FNS benefit past the 11/30. Advised pt he can renew online as well and encouraged him to reach out to caseworker [name] for assistance. Pt is seeking physical therapy through [facility] - on wait list for several weeks  now.  Provided pt with list of dental providers.        Recommendation:   attend all scheduled provider appointments call for transportation assistance at least one week before appointments  Follow Up Plan:   Telephone follow up appointment date/time:  05/22/2024 at 11:30AM.  [name], BSW [place]/VBCI - [facility] Social Worker [phone]

## 2024-05-09 ENCOUNTER — Ambulatory Visit: Attending: Physician Assistant

## 2024-05-09 ENCOUNTER — Encounter: Payer: Self-pay | Admitting: Physician Assistant

## 2024-05-09 ENCOUNTER — Ambulatory Visit: Attending: Physician Assistant | Admitting: Physician Assistant

## 2024-05-09 VITALS — BP 130/82 | HR 80 | Ht 75.0 in | Wt 221.0 lb

## 2024-05-09 DIAGNOSIS — I629 Nontraumatic intracranial hemorrhage, unspecified: Secondary | ICD-10-CM | POA: Diagnosis not present

## 2024-05-09 DIAGNOSIS — R001 Bradycardia, unspecified: Secondary | ICD-10-CM

## 2024-05-09 DIAGNOSIS — R079 Chest pain, unspecified: Secondary | ICD-10-CM | POA: Diagnosis present

## 2024-05-09 NOTE — Progress Notes (Unsigned)
 Cardiology Office Note   Date:  05/11/2024  ID:  [name]., DOB 1990-07-26, MRN [id number] PCP: Jaycee Greig PARAS, NP  [facility] Providers Cardiologist:  [facility] - DOD Dr. Kriste  History of Present Illness Windell Musson. is a 33 y.o. male with past medical history of asthma, anxiety, GERD, intracranial hemorrhage of unknown etiology 2/25, seizure followed by neurology, Mallory-Weiss tear, ADD and hyperlipidemia.  In February 2025, patient woke up with headache and a generalized weakness.  He tried to get up at the fell due to left-sided weakness.  He was taken to the hospital, imaging showed a right frontoparietal subcortical intracranial hemorrhage.  CTA, CT venogram and MRI of the brain showed no specific cause.  Diagnostic cerebral angiogram showed no underlying vascular malformation.  Echocardiogram obtained on 08/22/2023 showed EF 55 to 60%, no regional wall motion abnormality, no significant valve issue.  Patient was seen in the ED in May 2025 for tremors.  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  Patient was referred to cardiology service for evaluation of bradycardia and the chest discomfort.  He also has a history of gynecomastia was complaining of left chest bigger than right chest.  He was last seen by PCP on 05/01/2024, EKG obtained at the time showed sinus rhythm, heart rate 59 bpm, no significant ST-T wave changes.  Based on recent message, patient informed his neurologist that he has stopped taking Keppra .   Patient presents today for evaluation of bradycardia and left-sided chest discomfort.  He says he used to have left-sided chest pain that is worse with deep palpation and inspiration.  Recent chest pain is described as sharp shooting pain that is not affected by palpation or deep inspiration.  Symptom does not worsen with  activity.  He was concerned about bradycardia.  During the previous hospitalization in February, he was told his heart rate would dip down to the 30s and 40s.  Recent EKG showed heart rate is in the high 50s.  I suspect bradycardia primarily occurs at night.  I will order a 7-day heart monitor to assess his heart rate.  I will also order a plain treadmill test to get an idea of his heart rate response with exercise as well.  Suspicion for significant CAD is fairly low.  If both treadmill stress test and 7-day heart monitor came back okay, patient can follow-up with cardiology service on a as needed basis.   ROS:   Patient had intermittent chest discomfort.  He denies any shortness of breath, he has no lower extremity edema, orthopnea or PND.  Studies Reviewed      Cardiac Studies & Procedures   ______________________________________________________________________________________________     ECHOCARDIOGRAM  ECHOCARDIOGRAM COMPLETE 08/22/2023  Narrative ECHOCARDIOGRAM REPORT    Patient Name:   Zaeden Lastinger. Date of Exam: 08/22/2023 Medical Rec #:  [id number]         Height:       73.0 in Accession #:    [id number]        Weight:       132.1 lb Date of Birth:  December 23, 1990        BSA:          1.804 m Patient Age:    32 years          BP:  124/80 mmHg Patient Gender: M                 HR:           55 bpm. Exam Location:  Inpatient  Procedure: 2D Echo, Cardiac Doppler and Color Doppler (Both Spectral and Color Flow Doppler were utilized during procedure).  Indications:    Stroke I63.9  History:        Patient has no prior history of Echocardiogram examinations.  Sonographer:    Thea Norlander RCS Referring Phys: [id number] ASHISH ARORA  IMPRESSIONS   1. Left ventricular ejection fraction, by estimation, is 55 to 60%. The left ventricle has normal function. The left ventricle has no regional wall motion abnormalities. Left ventricular diastolic parameters are  indeterminate. 2. Right ventricular systolic function is normal. The right ventricular size is normal. 3. The mitral valve is normal in structure. No evidence of mitral valve regurgitation. No evidence of mitral stenosis. 4. The aortic valve is tricuspid. There is mild calcification of the aortic valve. Aortic valve regurgitation is not visualized. No aortic stenosis is present. 5. The inferior vena cava is dilated in size with >50% respiratory variability, suggesting right atrial pressure of 8 mmHg.  FINDINGS Left Ventricle: Left ventricular ejection fraction, by estimation, is 55 to 60%. The left ventricle has normal function. The left ventricle has no regional wall motion abnormalities. Strain imaging was not performed. The left ventricular internal cavity size was normal in size. There is no left ventricular hypertrophy. Left ventricular diastolic parameters are indeterminate.  Right Ventricle: The right ventricular size is normal. No increase in right ventricular wall thickness. Right ventricular systolic function is normal.  Left Atrium: Left atrial size was normal in size.  Right Atrium: Right atrial size was normal in size.  Pericardium: There is no evidence of pericardial effusion.  Mitral Valve: The mitral valve is normal in structure. No evidence of mitral valve regurgitation. No evidence of mitral valve stenosis.  Tricuspid Valve: The tricuspid valve is normal in structure. Tricuspid valve regurgitation is trivial. No evidence of tricuspid stenosis.  Aortic Valve: The aortic valve is tricuspid. There is mild calcification of the aortic valve. Aortic valve regurgitation is not visualized. No aortic stenosis is present. Aortic valve peak gradient measures 5.7 mmHg.  Pulmonic Valve: The pulmonic valve was normal in structure. Pulmonic valve regurgitation is not visualized. No evidence of pulmonic stenosis.  Aorta: The aortic root is normal in size and structure.  Venous: The  inferior vena cava is dilated in size with greater than 50% respiratory variability, suggesting right atrial pressure of 8 mmHg.  IAS/Shunts: No atrial level shunt detected by color flow Doppler.  Additional Comments: 3D imaging was not performed.   LEFT VENTRICLE PLAX 2D LVIDd:         5.10 cm   Diastology LVIDs:         3.30 cm   LV e' medial:    16.40 cm/s LV PW:         0.70 cm   LV E/e' medial:  5.2 LV IVS:        0.80 cm   LV e' lateral:   24.20 cm/s LVOT diam:     2.30 cm   LV E/e' lateral: 3.5 LV SV:         81 LV SV Index:   45 LVOT Area:     4.15 cm   RIGHT VENTRICLE  IVC RV S prime:     15.10 cm/s  IVC diam: 2.60 cm TAPSE (M-mode): 2.6 cm  LEFT ATRIUM             Index        RIGHT ATRIUM           Index LA diam:        2.50 cm 1.39 cm/m   RA Area:     14.00 cm LA Vol (A2C):   35.6 ml 19.74 ml/m  RA Volume:   33.00 ml  18.30 ml/m LA Vol (A4C):   36.9 ml 20.46 ml/m LA Biplane Vol: 39.9 ml 22.12 ml/m AORTIC VALVE AV Area (Vmax): 3.31 cm AV Vmax:        119.00 cm/s AV Peak Grad:   5.7 mmHg LVOT Vmax:      94.80 cm/s LVOT Vmean:     55.300 cm/s LVOT VTI:       0.194 m  AORTA Ao Root diam: 3.50 cm  MITRAL VALVE MV Area (PHT): 3.42 cm    SHUNTS MV Decel Time: 222 msec    Systemic VTI:  0.19 m MV E velocity: 85.50 cm/s  Systemic Diam: 2.30 cm MV A velocity: 37.20 cm/s MV E/A ratio:  2.30  Toribio Fuel MD Electronically signed by Toribio Fuel MD Signature Date/Time: 08/22/2023/12:02:46 PM    Final          ______________________________________________________________________________________________      Risk Assessment/Calculations          Physical Exam VS:  BP 130/82   Pulse 80   Ht 6' 3 (1.905 m)   Wt 221 lb (100.2 kg)   SpO2 97%   BMI 27.62 kg/m        Wt Readings from Last 3 Encounters:  05/09/24 221 lb (100.2 kg)  05/01/24 217 lb 9.6 oz (98.7 kg)  03/23/24 214 lb 6.4 oz (97.3 kg)    GEN: Well  nourished, well developed in no acute distress NECK: No JVD; No carotid bruits CARDIAC: RRR, no murmurs, rubs, gallops RESPIRATORY:  Clear to auscultation without rales, wheezing or rhonchi  ABDOMEN: Soft, non-tender, non-distended EXTREMITIES:  No edema; No deformity   ASSESSMENT AND PLAN  Chest discomfort: Symptom is described as a sharp shooting pain that is not affected by palpation or deep inspiration.  Symptom is not worsened with activity.  Overall pretest probability of significant cardiac issue is fairly low, recommend plain old treadmill test partially for chest pain and more so to make sure appropriate heart rate response with exercise  Bradycardia: Patient has noted heart rate dipping down to the 30s and 40s, in the office today, heart rate was around 70 to 80s.  Will obtain 7-day heart monitor.       Dispo: If both stress test and the heart monitor came back okay, patient can follow-up as needed.  Signed, Scot Ford, PA

## 2024-05-09 NOTE — Patient Instructions (Signed)
 Medication Instructions:  Your physician recommends that you continue on your current medications as directed. Please refer to the Current Medication list given to you today.  *If you need a refill on your cardiac medications before your next appointment, please call your pharmacy*  Lab Work: None ordered  If you have labs (blood work) drawn today and your tests are completely normal, you will receive your results only by: MyChart Message (if you have MyChart) OR A paper copy in the mail If you have any lab test that is abnormal or we need to change your treatment, we will call you to review the results.  Testing/Procedures: Your physician has requested that you have an exercise tolerance test. For further information please visit [email or web address] Please also follow instruction sheet, BELOW:  - DO NOT EAT, DRINK, OR USE TOBACCO PRODUCTS WITHIN 4 HOURS OF THE TEST - DRESS PREPARED TO EXERCISE IN A COMFORTABLE, 2 PIECE CLOTHING OUTFIT AND WALKING SHOES - BRING ANY PRESCRIPTION MEDICATIONS WITH YOU  - NOTIFY THE OFFICE 24 HOURS IN ADVANCE IF YOU NEED TO CANCEL - CALL THE OFFICE [phone] IF YOU HAVE ANY QUESTIONS   ZIO XT- Long Term Monitor Instructions  Your physician has requested you wear a ZIO patch monitor for  7 days.  This is a single patch monitor. [employer] supplies one patch monitor per enrollment. Additional stickers are not available. Please do not apply patch if you will be having a Nuclear Stress Test,  Echocardiogram, Cardiac CT, MRI, or Chest Xray during the period you would be wearing the  monitor. The patch cannot be worn during these tests. You cannot remove and re-apply the  ZIO XT patch monitor.  Your ZIO patch monitor will be mailed 3 day USPS to your address on file. It may take 3-5 days  to receive your monitor after you have been enrolled.  Once you have received your monitor, please review the enclosed instructions. Your monitor  has already been  registered assigning a specific monitor serial # to you.  Billing and Patient Assistance Program Information  We have supplied [employer] with any of your insurance information on file for billing purposes. [employer] offers a sliding scale Patient Assistance Program for patients that do not have  insurance, or whose insurance does not completely cover the cost of the ZIO monitor.  You must apply for the Patient Assistance Program to qualify for this discounted rate.  To apply, please call [employer] at [phone], select option 4, select option 2, ask to apply for  Patient Assistance Program. Meredeth will ask your household income, and how many people  are in your household. They will quote your out-of-pocket cost based on that information.  [employer] will also be able to set up a 38-month, interest-free payment plan if needed.  Applying the monitor   Shave hair from upper left chest.  Hold abrader disc by orange tab. Rub abrader in 40 strokes over the upper left chest as  indicated in your monitor instructions.  Clean area with 4 enclosed alcohol pads. Let dry.  Apply patch as indicated in monitor instructions. Patch will be placed under collarbone on left  side of chest with arrow pointing upward.  Rub patch adhesive wings for 2 minutes. Remove white label marked 1. Remove the white  label marked 2. Rub patch adhesive wings for 2 additional minutes.  While looking in a mirror, press and release button in center of patch. A small green light will  flash 3-4 times. This will  be your only indicator that the monitor has been turned on.  Do not shower for the first 24 hours. You may shower after the first 24 hours.  Press the button if you feel a symptom. You will hear a small click. Record Date, Time and  Symptom in the Patient Logbook.  When you are ready to remove the patch, follow instructions on the last 2 pages of Patient  Logbook. Stick patch monitor onto the last page of Patient  Logbook.  Place Patient Logbook in the blue and white box. Use locking tab on box and tape box closed  securely. The blue and white box has prepaid postage on it. Please place it in the mailbox as  soon as possible. Your physician should have your test results approximately 7 days after the  monitor has been mailed back to [facility].  Call [facility] Customer Care at [phone] if you have questions regarding  your ZIO XT patch monitor. Call them immediately if you see an orange light blinking on your  monitor.  If your monitor falls off in less than 4 days, contact our Monitor department at [phone].  If your monitor becomes loose or falls off after 4 days call [employer] at [phone] for  suggestions on securing your monitor   Follow-Up: At [facility], you and your health needs are our priority.  As part of our continuing mission to provide you with exceptional heart care, our providers are all part of one team.  This team includes your primary Cardiologist (physician) and Advanced Practice Providers or APPs (Physician Assistants and Nurse Practitioners) who all work together to provide you with the care you need, when you need it.  Your next appointment:   DEPENDING UPON TEST RESULTS   Provider:   Scot Ford, PA-C          We recommend signing up for the patient portal called MyChart.  Sign up information is provided on this After Visit Summary.  MyChart is used to connect with patients for Virtual Visits (Telemedicine).  Patients are able to view lab/test results, encounter notes, upcoming appointments, etc.  Non-urgent messages can be sent to your provider as well.   To learn more about what you can do with MyChart, go to [email or web address].   Other Instructions

## 2024-05-09 NOTE — Progress Notes (Unsigned)
 Enrolled for [employer] to mail a ZIO XT long term holter monitor to the patients address on file.   Requested delivery 05/16/24.  Dr. Kriste to read.

## 2024-05-10 ENCOUNTER — Encounter (HOSPITAL_BASED_OUTPATIENT_CLINIC_OR_DEPARTMENT_OTHER): Admitting: Psychology

## 2024-05-10 DIAGNOSIS — F4389 Other reactions to severe stress: Secondary | ICD-10-CM | POA: Diagnosis not present

## 2024-05-10 DIAGNOSIS — F411 Generalized anxiety disorder: Secondary | ICD-10-CM | POA: Diagnosis not present

## 2024-05-10 DIAGNOSIS — I629 Nontraumatic intracranial hemorrhage, unspecified: Secondary | ICD-10-CM | POA: Diagnosis not present

## 2024-05-11 ENCOUNTER — Ambulatory Visit (HOSPITAL_COMMUNITY)
Admission: RE | Admit: 2024-05-11 | Discharge: 2024-05-11 | Disposition: A | Discharge status: Home / Self Care | Source: Ambulatory Visit | Attending: Physician Assistant | Admitting: Physician Assistant

## 2024-05-11 DIAGNOSIS — R001 Bradycardia, unspecified: Secondary | ICD-10-CM | POA: Insufficient documentation

## 2024-05-11 DIAGNOSIS — R079 Chest pain, unspecified: Secondary | ICD-10-CM | POA: Diagnosis present

## 2024-05-11 LAB — EXERCISE TOLERANCE TEST
Angina Index: 0
Duke Treadmill Score: 6
Estimated workload: 7.2
Exercise duration (min): 6 min
Exercise duration (sec): 11 s
MPHR: 189 {beats}/min
Peak HR: 181 {beats}/min
Percent HR: 95 %
Rest HR: 117 {beats}/min
ST Depression (mm): 0 mm

## 2024-05-14 ENCOUNTER — Ambulatory Visit: Payer: Self-pay | Admitting: Physician Assistant

## 2024-05-15 ENCOUNTER — Ambulatory Visit

## 2024-05-15 ENCOUNTER — Ambulatory Visit
Admission: RE | Admit: 2024-05-15 | Discharge: 2024-05-15 | Disposition: A | Source: Ambulatory Visit | Attending: Family | Admitting: Family

## 2024-05-15 ENCOUNTER — Ambulatory Visit
Admission: RE | Admit: 2024-05-15 | Discharge: 2024-05-15 | Disposition: A | Discharge status: Home / Self Care | Source: Ambulatory Visit | Attending: Physical Medicine and Rehabilitation | Admitting: Physical Medicine and Rehabilitation

## 2024-05-15 DIAGNOSIS — N62 Hypertrophy of breast: Secondary | ICD-10-CM

## 2024-05-15 DIAGNOSIS — N50819 Testicular pain, unspecified: Secondary | ICD-10-CM

## 2024-05-17 ENCOUNTER — Encounter: Admitting: Psychology

## 2024-05-19 NOTE — Progress Notes (Signed)
 Neuropsychology / Psychotherapy Note  Erik Santana. [facility]  Physical Medicine and Rehabilitation    Patient: Erik Santana  DOB: 08/08/1990  MRN: [id number]  Provider: Evalene DOROTHA Riff, PsyD Modality & Individuals Present: The patient was seen in-person, unaccompanied, by the provider in the PM&R outpatient clinic office.  Location: [facility] [address] [place] [address]  Date of Service: 05/10/2024 Duration of Service:  60 min for in-person, face to face, psychotherapy for adjustment to injury and psychiatric symptoms due to ABI within the context of premorbid anxiety/panic, depression, and history of trauma.  Record Review & Presenting Concerns (Per 02/23/24 intake):The patient is a 33 year old male referred for psychotherapy.  The patient has a past medical history, per records, of PTSD, ADHD, GAD. Hospital medical records (08/24/23) indicated the patient presented with left sided weakness, NIH of 7 on admission, and subsequent CT of the head showed  CT head showed ICH in right fronto parietal region likely to be embolic in nature. MRI showed unchanged appearance of intraparenchymal hematoma within the superior right frontal lobe. No mass lesion. Echo showed LVEF 55-60%, Patient underwent cerebral angiogram which showed no gross abnormalities identified in terms of aneurysms, dural AV fistula, A/V shunting, dissections, or  intraluminal filling defects.Dural venous sinuses grossly patent. Neurology recommended no antithrombotic due to acute ICH. Patient cleared for discharge to acute inpatient rehab.Neurology signed off. 09/22/2023 PM&R HPI indicated ... 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  Presented 08/21/2023 with acute onset of left-sided weakness as well as dizziness and headache.  He was able to call EMS himself.  EMS found him on the floor... CT of the head  showed a 3.9 x 2.4 x 4.1 cm parenchymal hemorrhage centered at the right frontal parietal junction.  No evidence of intraventricular extension or mass effect.  CT cervical spine negative.  CTA showed no intracranial large vessel occlusion or significant stenosis...MRI follow-up showed unchanged appearance of intraparenchymal hematoma within the superior right frontal lobe.  No mass lesion.  Arteriogram completed showing no gross abnormalities... [name] added for tobacco use.  Blood pressure controlled he has not required Cleviprex .  Therapy evaluations completed due to patient decreased functional mobility was admitted for a comprehensive rehab program. The patient met with Psychiatry with [facility] to establish care on 01/04/2024. Visit notes indicate the patient had been on Lexapro  but discontinued due to side effects. He had been off medications excluding muscle relaxer and buspirone  for about one month. Difficulties with panic/anxiety when in public settings became prominent. The patient reached out to PM&R for assistance with medications following that.  Visit notes from his PM&R visit on 02/08/2024 indicated he was started on trazodone  to help with sleep.  Prazosin  was discontinued due to dreams.  Valium  was resumed.  In notes indicated the physiatrist is reaching out to disability specialist for paperwork needs for his application.  Visit notes indicated spasticity and GERD were also present.   The patient presents with premorbid ADHD, anxiety, and history of trauma. 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 Difficulties with anxiety are prominent, and there is some concern for PTSD necessitating closer assessment. The patient demonstrates excellent insight into his difficulties and descriptions suggest  strong ability for perspective taking, but linked to  complex and challenging environment growing up. The patient is likely to benefit from individual therapy integrating mindfulness/relaxation strategies, along with EF cognitive rehab strategies if needed. Trauma treatment may be beneficial, and emotional suppression/avoidance may be factors exacerbating symptoms and warrant attention as well. Current session focused on obtaining background information and diagnostic information as well as establishing rapport and clarifying patient goals for treatment. Emotion regulation is primary focus, particularly with management of anxiety (cognitive and physiological Sx). Adjustment to injury work and more concrete goal setting (finding purpose/direction). Increased activity levels. 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  Adjustment to injury & Cognitive Establish health relationships (social/romantic), reduce engagement in prior interpersonal dynamic tendencies, live in accordance with personal beliefs/values.   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 Identify areas of cognitive difficulty, learn and utilize compensatory strategies. Anxiety/Panic Learn and utilize CBT restructuring techniques Learn and utilize relaxation/mindfulness strategies Reduce avoidance behaviors ADHD? - Evaluate - Coordinate with Emeline or psychiatry if needed.  Other trauma- and stressor related disorder (PTSD? PD?)  Session Content: Goals focused on this session: Psychoeducation, interpersonal skills/social communication, emotion regulation  Themes/Topics Discussed: -  Pt reported resumption of Keppra  due to Sx he suspects are caused by seizure activity. Patient reconnected with neurology and was encouraged to resume the medication.  - Patient described indications that appear to align closely with Anxiety, but the provider offered to the patient that assessment of epilepsy was beyond the scope of the provider's training and therefore would defer to the treating physician.  - The patient is scheduled to follow-up with neurology mid next year. - Writer observed the patient's confidence that Sx are due to seizure, and tried to provide psychoeducation to caution against definitive conclusions without evaluation by specialist, observing alignment with anxiety Sx. Writer provided psychoeducation noting that anxiety is exceptionally common among seizure disorders, and can even manifest as co-occurring epileptic and non-epileptic seizures.  - Communication b/t provider and patient was complex, and interaction provided meaningful exploration of patient's internal experiences, providers observations and internal experiences, and reflection of how certain interactions can elicit a response style driven by feeling dismissed or not believed. Provided validated patient's experience and processed this with patient in-session.  - Communication stabilized and relationship/trust appeared to be re-established (if ever broken) Interventions and Techniques Used:  Empathic support, emotional identification, connection of thought-feeling, process work, and impact of past on perceptual tendencies (interpretive).  Behavioral/Assessment  Alertness/Attention: Alert. Attentive Orientation: Intact Language: Speech prosodic, fluent, well articulated. Receptive language intact.  Motor/Sensory: Ambulated independently and without issue.  Mood/Affect: Irritability to euthymic, congruent with patient perspective/understandable. Mood and Affect congruent. Mood/affect returned to baseline prior to  session.  Interaction: Cooperative, engaged. Genuine.  Cognition: - Progress: Pt responded well to psychoeducation.  Barriers: Difficulties with attention/concentration.Erik Santana interpret all physio as pathology/physical health concern. Suicide/Homicide/Aggression Risk: No SI or HI.   Session Assignment: - Bring planner - Continue to engage in activities outside the home   Revisit  - Do breathing exercise 1x/day (ongoing) - Read anx/panic psychoed handouts. (Ongoing) - CBT thought-feeling connection exercise/worsksheet (ongoing) - Conversation with child-self exercise    Evalene DOROTHA Riff, PsyD Clinical Neuropsychology [address] [place], [place] [address] Main: [phone]  Fax: [phone] [place]: [phone]   Consent and Confidentiality (reviewed 02/23/24): The nature, purpose, anticipated course, modalities, risks and benefits, limitations, alternative treatment options, of psychotherapy were reviewed with the patient. The patient was informed of the expected benefits, potential risks, and limitations of treatment, as well as alternative treatment options. The patient was given the opportunity to ask questions. The patient understands that participation in psychotherapy is voluntary and may be discontinued at any time. Confidentiality of all information shared during psychotherapy is maintained in accordance with federal and state laws, including the [employer] and Accountability Act (HIPAA). Psychotherapy notes are not disclosed without specific patient authorization, except as required by law (e.g., risk of harm to self or others, court order, or mandatory reporting). The patient has been informed of the limits of confidentiality and the circumstances under which information may be disclosed. The patient acknowledged understanding of the above information and consents to participate in psychotherapy under these terms.  This report was generated using voice  recognition software. While this document has been carefully reviewed, transcription errors may be present. I apologize in advance for any inconvenience. Please contact me if further clarification is needed.

## 2024-05-19 NOTE — Progress Notes (Signed)
 Neuropsychology / Psychotherapy Note  Jolynn DEL. [facility]  Physical Medicine and Rehabilitation    Patient: Erik Santana  DOB: 1991-06-23  MRN: [id number]  Provider: Evalene DOROTHA Riff, PsyD Modality & Individuals Present: The patient was seen in-person, unaccompanied, by the provider in the PM&R outpatient clinic office.  Location: [facility] Physical Medicine & Rehabilitation [address] [place] [address]  Date of Service: 04/18/2024 Duration of Service:  60 min for in-person, face to face, psychotherapy for adjustment to injury and psychiatric symptoms due to ABI within the context of premorbid anxiety/panic, depression, and history of trauma.  Record Review & Presenting Concerns (Per 02/23/24 intake):The patient is a 33 year old male referred for psychotherapy.  The patient has a past medical history, per records, of PTSD, ADHD, GAD. Hospital medical records (08/24/23) indicated the patient presented with left sided weakness, NIH of 7 on admission, and subsequent CT of the head showed  CT head showed ICH in right fronto parietal region likely to be embolic in nature. MRI showed unchanged appearance of intraparenchymal hematoma within the superior right frontal lobe. No mass lesion. Echo showed LVEF 55-60%, Patient underwent cerebral angiogram which showed no gross abnormalities identified in terms of aneurysms, dural AV fistula, A/V shunting, dissections, or  intraluminal filling defects.Dural venous sinuses grossly patent. Neurology recommended no antithrombotic due to acute ICH. Patient cleared for discharge to acute inpatient rehab.Neurology signed off. 09/22/2023 PM&R HPI indicated ... 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  Presented 08/21/2023 with acute onset of left-sided weakness as well as dizziness and headache.  He was able to call EMS himself.  EMS found him on the floor... CT of the head  showed a 3.9 x 2.4 x 4.1 cm parenchymal hemorrhage centered at the right frontal parietal junction.  No evidence of intraventricular extension or mass effect.  CT cervical spine negative.  CTA showed no intracranial large vessel occlusion or significant stenosis...MRI follow-up showed unchanged appearance of intraparenchymal hematoma within the superior right frontal lobe.  No mass lesion.  Arteriogram completed showing no gross abnormalities... [name] added for tobacco use.  Blood pressure controlled he has not required Cleviprex .  Therapy evaluations completed due to patient decreased functional mobility was admitted for a comprehensive rehab program. The patient met with Psychiatry with [facility] to establish care on 01/04/2024. Visit notes indicate the patient had been on Lexapro  but discontinued due to side effects. He had been off medications excluding muscle relaxer and buspirone  for about one month. Difficulties with panic/anxiety when in public settings became prominent. The patient reached out to PM&R for assistance with medications following that.  Visit notes from his PM&R visit on 02/08/2024 indicated he was started on trazodone  to help with sleep.  Prazosin  was discontinued due to dreams.  Valium  was resumed.  In notes indicated the physiatrist is reaching out to disability specialist for paperwork needs for his application.  Visit notes indicated spasticity and GERD were also present.   The patient presents with premorbid ADHD, anxiety, and history of trauma. 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 Difficulties with anxiety are prominent, and there is some concern for PTSD necessitating closer assessment. The patient demonstrates excellent insight into his difficulties and descriptions suggest  strong ability for perspective taking, but linked to  complex and challenging environment growing up. The patient is likely to benefit from individual therapy integrating mindfulness/relaxation strategies, along with EF cognitive rehab strategies if needed. Trauma treatment may be beneficial, and emotional suppression/avoidance may be factors exacerbating symptoms and warrant attention as well. Current session focused on obtaining background information and diagnostic information as well as establishing rapport and clarifying patient goals for treatment. Emotion regulation is primary focus, particularly with management of anxiety (cognitive and physiological Sx). Adjustment to injury work and more concrete goal setting (finding purpose/direction). Increased activity levels. 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  Adjustment to injury & Cognitive Establish health relationships (social/romantic), reduce engagement in prior interpersonal dynamic tendencies, live in accordance with personal beliefs/values.   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 Identify areas of cognitive difficulty, learn and utilize compensatory strategies. Anxiety/Panic Learn and utilize CBT restructuring techniques Learn and utilize relaxation/mindfulness strategies Reduce avoidance behaviors ADHD? - Evaluate - Coordinate with Emeline or psychiatry if needed.  Other trauma- and stressor related disorder (PTSD?)  Session Content: Goals focused on this session: Anxiety/panic, CBT  Themes/Topics Discussed: - Pt reported feeling more like himself with d/c Keppra , but  increased anxiety. Provided psychoed. Continues to take Buspar . Tried to not take it, but made mood sig worse. Pt encouraged to follow prescribing physician recommendations. Pt using valium  only PRN. Provided reinforced this, providing psychoeducation regarding risks of utilizing valium  for more generalized anxiety.  - Patient discussed in more depth history and interpersonal relationships, and challenging environment growing up. Provided observed ways in which those experiences help make sense of areas patient currently struggling with. Explored ways to utilize those prior experiences as means to guide future decision making/identifying what pt does want in life versus doesn't.  - Engaged in collaborative problem solving to identify ways patient can increase activity during the day, taking long term goal and breaking down into smaller chunks or exploring alternative but parallel goals when target goal isn't feasible.  - Brought planner. Encouraged him to use it for consult recs w/Engler, who joined session briefly. Pt indicated his memory was good. Writer agreed, but observed difficulty with follow through/tracking, noting Pt reported Hx ADHD.  Interventions and Techniques Used:  Empathic support, goal setting/problem solving exercise, psychoeducation, adjustment to marked life transition.  Behavioral/Assessment  Alertness/Attention: Alert. Slightly tangential.  Orientation: Intact Language: Speech prosodic, fluent, well articulated. Receptive language intact.  Motor/Sensory: Ambulated independently and without issue.  Mood/Affect: Anx elevated but not marked. Affect congruent.  Interaction: Cooperative, engaged. Genuine.  Cognition: Cognitively appeared more lucid/less tangential relative to baseline.  Progress: Pt responded well to psychoeducation.  Barriers: Difficulties with attention/concentration. Possible emotion identification difficulties. Freq interpret all physio as pathology/physical  health concern Suicide/Homicide/Aggression Risk: No SI or HI.   Session Assignment: - Bring planner - Follow-up on patient questions regarding Bipolar. Additional assessment.    Revisit  - Do breathing exercise 1x/day (ongoing) - Read anx/panic psychoed handouts. (Ongoing) - CBT thought-feeling connection exercise/worsksheet.     Evalene DOROTHA Riff, PsyD Clinical Neuropsychology [address] [place], [place] [address] Main: [phone] Fax: [phone] [place]: [phone]   Consent and Confidentiality (reviewed 02/23/24): The nature, purpose, anticipated course, modalities, risks and benefits, limitations, alternative treatment  options, of psychotherapy were reviewed with the patient. The patient was informed of the expected benefits, potential risks, and limitations of treatment, as well as alternative treatment options. The patient was given the opportunity to ask questions. The patient understands that participation in psychotherapy is voluntary and may be discontinued at any time. Confidentiality of all information shared during psychotherapy is maintained in accordance with federal and state laws, including the [employer] and Accountability Act (HIPAA). Psychotherapy notes are not disclosed without specific patient authorization, except as required by law (e.g., risk of harm to self or others, court order, or mandatory reporting). The patient has been informed of the limits of confidentiality and the circumstances under which information may be disclosed. The patient acknowledged understanding of the above information and consents to participate in psychotherapy under these terms.  This report was generated using voice recognition software. While this document has been carefully reviewed, transcription errors may be present. I apologize in advance for any inconvenience. Please contact me if further clarification is needed.

## 2024-05-19 NOTE — Progress Notes (Signed)
 Neuropsychology / Psychotherapy Note  Jolynn DEL. [facility]  Physical Medicine and Rehabilitation    Patient: Erik Santana  DOB: 1990-09-12  MRN: [id number]  Provider: Evalene DOROTHA Riff, PsyD Modality & Individuals Present: The patient was seen in-person, unaccompanied, by the provider in the PM&R outpatient clinic office.  Location: [facility] Physical Medicine & Rehabilitation [address] [place] [address]  Date of Service: 05/02/2024 Duration of Service:  60 min for in-person, face to face, psychotherapy for adjustment to injury and psychiatric symptoms due to ABI within the context of premorbid anxiety/panic, depression, and history of trauma.  Record Review & Presenting Concerns (Per 02/23/24 intake):The patient is a 33 year old male referred for psychotherapy.  The patient has a past medical history, per records, of PTSD, ADHD, GAD. Hospital medical records (08/24/23) indicated the patient presented with left sided weakness, NIH of 7 on admission, and subsequent CT of the head showed  CT head showed ICH in right fronto parietal region likely to be embolic in nature. MRI showed unchanged appearance of intraparenchymal hematoma within the superior right frontal lobe. No mass lesion. Echo showed LVEF 55-60%, Patient underwent cerebral angiogram which showed no gross abnormalities identified in terms of aneurysms, dural AV fistula, A/V shunting, dissections, or  intraluminal filling defects.Dural venous sinuses grossly patent. Neurology recommended no antithrombotic due to acute ICH. Patient cleared for discharge to acute inpatient rehab.Neurology signed off. 09/22/2023 PM&R HPI indicated ... 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  Presented 08/21/2023 with acute onset of left-sided weakness as well as dizziness and headache.  He was able to call EMS himself.  EMS found him on the floor... CT of the head  showed a 3.9 x 2.4 x 4.1 cm parenchymal hemorrhage centered at the right frontal parietal junction.  No evidence of intraventricular extension or mass effect.  CT cervical spine negative.  CTA showed no intracranial large vessel occlusion or significant stenosis...MRI follow-up showed unchanged appearance of intraparenchymal hematoma within the superior right frontal lobe.  No mass lesion.  Arteriogram completed showing no gross abnormalities... [name] added for tobacco use.  Blood pressure controlled he has not required Cleviprex .  Therapy evaluations completed due to patient decreased functional mobility was admitted for a comprehensive rehab program. The patient met with Psychiatry with [facility] to establish care on 01/04/2024. Visit notes indicate the patient had been on Lexapro  but discontinued due to side effects. He had been off medications excluding muscle relaxer and buspirone  for about one month. Difficulties with panic/anxiety when in public settings became prominent. The patient reached out to PM&R for assistance with medications following that.  Visit notes from his PM&R visit on 02/08/2024 indicated he was started on trazodone  to help with sleep.  Prazosin  was discontinued due to dreams.  Valium  was resumed.  In notes indicated the physiatrist is reaching out to disability specialist for paperwork needs for his application.  Visit notes indicated spasticity and GERD were also present.   The patient presents with premorbid ADHD, anxiety, and history of trauma. 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 Difficulties with anxiety are prominent, and there is some concern for PTSD necessitating closer assessment. The patient demonstrates excellent insight into his difficulties and descriptions suggest  strong ability for perspective taking, but linked to  complex and challenging environment growing up. The patient is likely to benefit from individual therapy integrating mindfulness/relaxation strategies, along with EF cognitive rehab strategies if needed. Trauma treatment may be beneficial, and emotional suppression/avoidance may be factors exacerbating symptoms and warrant attention as well. Current session focused on obtaining background information and diagnostic information as well as establishing rapport and clarifying patient goals for treatment. Emotion regulation is primary focus, particularly with management of anxiety (cognitive and physiological Sx). Adjustment to injury work and more concrete goal setting (finding purpose/direction). Increased activity levels. 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  Adjustment to injury & Cognitive Establish health relationships (social/romantic), reduce engagement in prior interpersonal dynamic tendencies, live in accordance with personal beliefs/values.   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 Identify areas of cognitive difficulty, learn and utilize compensatory strategies. Anxiety/Panic Learn and utilize CBT restructuring techniques Learn and utilize relaxation/mindfulness strategies Reduce avoidance behaviors ADHD? - Evaluate - Coordinate with Emeline or psychiatry if needed.  Other trauma- and stressor related disorder (PTSD? PD?)  Session Content: Goals focused on this session: Anxiety/panic, CBT  Themes/Topics Discussed: - Pt reported significant difficulties with sleep. Explored  patient's concerns about impact of sleep on physical health.  - Pt felt sleep difficulties were related to d/c of Keppra  medication. Patient reported physiological symptoms ongoing related to panic. - Explored sleep hygiene and strategies to normalizing sleep.  - Overall, patient indicated feeling he is progressing with respect to acute anxiety and reduced frequency of panic attacks. Worked to try and identify negative thoughts arising when experiencing acute anxiety. Patient struggled to do this. He articulated that there was some connection related to loss/abandonment.  - Processed patient thoughts/feelings/experiences related to romantic relationship in the past that he remained in to care for partners child. Explored patient ability to identify with that child.  - Explored what patient may say to self to comfort them, if able to speak to younger self. - Explored experiences patient had growing up and complex interaction with identity, adaptation, and values.  - Provided empathic support, observations, and inquiry to bolster patient insight.  Interventions and Techniques Used:  Empathic support, emotional identification, connection of thought-feeling, and impact of past on perceptual tendencies (interpretive).  Behavioral/Assessment  Alertness/Attention: Alert. Attentive Orientation: Intact Language: Speech prosodic, fluent, well articulated. Receptive language intact.  Motor/Sensory: Ambulated independently and without issue.  Mood/Affect: Dysphoric at times, largely euthymic though. Affect congruent.  Interaction: Cooperative, engaged. Genuine.  Cognition: Cognitively appeared more lucid/less tangential relative to baseline.  Progress: Pt responded well to psychoeducation.  Barriers: Difficulties with attention/concentration.SABRA [place]ings[place] interpret all physio as pathology/physical health concern. Suicide/Homicide/Aggression Risk: No SI or HI.   Session Assignment: - Bring planner - Continue to  engage in activities outside the home - Conversation with child-self exercise  Revisit  - Do breathing exercise 1x/day (ongoing) - Read anx/panic psychoed handouts. (Ongoing) - CBT thought-feeling connection exercise/worsksheet (ongoing)    Evalene DOROTHA Riff, PsyD Clinical Neuropsychology [address] [place], [place] [address] Main: [phone] Fax: [phone] [place]: [phone]   Consent and Confidentiality (reviewed 02/23/24): The nature, purpose, anticipated course, modalities, risks and benefits, limitations, alternative treatment options, of psychotherapy were reviewed with the patient. The patient was informed  of the expected benefits, potential risks, and limitations of treatment, as well as alternative treatment options. The patient was given the opportunity to ask questions. The patient understands that participation in psychotherapy is voluntary and may be discontinued at any time. Confidentiality of all information shared during psychotherapy is maintained in accordance with federal and state laws, including the [employer] and Accountability Act (HIPAA). Psychotherapy notes are not disclosed without specific patient authorization, except as required by law (e.g., risk of harm to self or others, court order, or mandatory reporting). The patient has been informed of the limits of confidentiality and the circumstances under which information may be disclosed. The patient acknowledged understanding of the above information and consents to participate in psychotherapy under these terms.  This report was generated using voice recognition software. While this document has been carefully reviewed, transcription errors may be present. I apologize in advance for any inconvenience. Please contact me if further clarification is needed.

## 2024-05-19 NOTE — Progress Notes (Signed)
 Neuropsychology / Psychotherapy Note  Jolynn DEL. [facility]  Physical Medicine and Rehabilitation    Patient: Erik Santana  DOB: 1990/11/28  MRN: [id number]  Provider: Evalene DOROTHA Riff, PsyD Modality & Individuals Present: The patient was seen in-person, unaccompanied, by the provider in the PM&R outpatient clinic office.  Location: [facility] Physical Medicine & Rehabilitation [address] [place] [address]  Date of Service: 04/12/2024 Duration of Service:  60 min for in-person, face to face, psychotherapy for adjustment to injury and psychiatric symptoms due to ABI within the context of premorbid anxiety/panic, depression, and history of trauma.  Record Review & Presenting Concerns (Per 02/23/24 intake):The patient is a 33 year old male referred for psychotherapy.  The patient has a past medical history, per records, of PTSD, ADHD, GAD. Hospital medical records (08/24/23) indicated the patient presented with left sided weakness, NIH of 7 on admission, and subsequent CT of the head showed  CT head showed ICH in right fronto parietal region likely to be embolic in nature. MRI showed unchanged appearance of intraparenchymal hematoma within the superior right frontal lobe. No mass lesion. Echo showed LVEF 55-60%, Patient underwent cerebral angiogram which showed no gross abnormalities identified in terms of aneurysms, dural AV fistula, A/V shunting, dissections, or  intraluminal filling defects.Dural venous sinuses grossly patent. Neurology recommended no antithrombotic due to acute ICH. Patient cleared for discharge to acute inpatient rehab.Neurology signed off. 09/22/2023 PM&R HPI indicated ... 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  Presented 08/21/2023 with acute onset of left-sided weakness as well as dizziness and headache.  He was able to call EMS himself.  EMS found him on the floor... CT of the head  showed a 3.9 x 2.4 x 4.1 cm parenchymal hemorrhage centered at the right frontal parietal junction.  No evidence of intraventricular extension or mass effect.  CT cervical spine negative.  CTA showed no intracranial large vessel occlusion or significant stenosis...MRI follow-up showed unchanged appearance of intraparenchymal hematoma within the superior right frontal lobe.  No mass lesion.  Arteriogram completed showing no gross abnormalities... [name] added for tobacco use.  Blood pressure controlled he has not required Cleviprex .  Therapy evaluations completed due to patient decreased functional mobility was admitted for a comprehensive rehab program. The patient met with Psychiatry with [facility] to establish care on 01/04/2024. Visit notes indicate the patient had been on Lexapro  but discontinued due to side effects. He had been off medications excluding muscle relaxer and buspirone  for about one month. Difficulties with panic/anxiety when in public settings became prominent. The patient reached out to PM&R for assistance with medications following that.  Visit notes from his PM&R visit on 02/08/2024 indicated he was started on trazodone  to help with sleep.  Prazosin  was discontinued due to dreams.  Valium  was resumed.  In notes indicated the physiatrist is reaching out to disability specialist for paperwork needs for his application.  Visit notes indicated spasticity and GERD were also present.   The patient presents with premorbid ADHD, anxiety, and history of trauma. 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 Difficulties with anxiety are prominent, and there is some concern for PTSD necessitating closer assessment. The patient demonstrates excellent insight into his difficulties and descriptions suggest  strong ability for perspective taking, but linked to  complex and challenging environment growing up. The patient is likely to benefit from individual therapy integrating mindfulness/relaxation strategies, along with EF cognitive rehab strategies if needed. Trauma treatment may be beneficial, and emotional suppression/avoidance may be factors exacerbating symptoms and warrant attention as well. Current session focused on obtaining background information and diagnostic information as well as establishing rapport and clarifying patient goals for treatment. Emotion regulation is primary focus, particularly with management of anxiety (cognitive and physiological Sx). Adjustment to injury work and more concrete goal setting (finding purpose/direction). Increased activity levels. 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  Adjustment to injury & Cognitive Establish health relationships (social/romantic), reduce engagement in prior interpersonal dynamic tendencies, live in accordance with personal beliefs/values.   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 Identify areas of cognitive difficulty, learn and utilize compensatory strategies. Anxiety/Panic Learn and utilize CBT restructuring techniques Learn and utilize relaxation/mindfulness strategies Reduce avoidance behaviors ADHD? - Evaluate - Coordinate with Emeline or psychiatry if needed.  Other trauma- and stressor related disorder (PTSD?)  Session Content: Goals focused on this session: Anxiety/panic, CBT  Themes/Topics Discussed: - Provided info from f/u convo with Emeline regarding d/c of Keppra .  Minimal sfx anticipated, safe to d/c, likely response to reduced sedating/slowing efx, but med not indicated for anxiety Tx.  - Patient brought planner.  - Pt appling to [employer].  - Patient now living in [place] and providing help to grandfather. Environment change is bit more anxiety inducing, [place] Pt offers that it benefits in terms of his re-engagement with life. Still relatively isolated socially.  - Introduction into CBT, provided psychoeducation and walking through exercise to illustrate connection between thought/perception and feelings. Patient showed understanding and assigned follow-up work for interim.  Interventions and Techniques Used:  Empathic support, psychoeducation, relaxation/mindfulness Behavioral/Assessment  Alertness/Attention: Alert. Slightly tangential.  Orientation: Intact Language: Speech prosodic, fluent, well articulated. Receptive language intact.  Motor/Sensory: Ambulated independently and without issue.  Mood/Affect: Anx lower than prior visit, closer to baseline of earlier sessions. Affect congruent.  Interaction: Cooperative, engaged. Genuine.  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 Likely racing thoughts as well.  Progress: Pt responded well to psychoeducation.  Barriers: Difficulties with attention/concentration. Possible emotion identification difficulties. Freq interpret all physio as pathology/physical health concern Suicide/Homicide/Aggression Risk: No SI or HI.   Session Assignment: - Bring planner - Do breathing exercise 1x/day (ongoing) - Read anx/panic psychoed handouts. (Ongoing) - CBT thought-feeling connection exercise/worsksheet.     Evalene DOROTHA Riff, PsyD Clinical Neuropsychology [address] [place], [place] [address] Main: [phone] Fax: [phone] [place]: [phone]   Consent and Confidentiality (reviewed 02/23/24): The nature, purpose, anticipated course,  modalities, risks and benefits, limitations, alternative treatment options, of psychotherapy were reviewed with the patient. The patient was informed of the expected benefits, potential risks, and limitations of treatment, as well as alternative treatment options. The patient was given the opportunity to ask questions. The patient understands that participation in psychotherapy is voluntary and may be discontinued at any time. Confidentiality of all information shared during psychotherapy is maintained in accordance with federal and state laws, including the [employer] and Accountability Act (HIPAA). Psychotherapy notes are not disclosed without  specific patient authorization, except as required by law (e.g., risk of harm to self or others, court order, or mandatory reporting). The patient has been informed of the limits of confidentiality and the circumstances under which information may be disclosed. The patient acknowledged understanding of the above information and consents to participate in psychotherapy under these terms.  This report was generated using voice recognition software. While this document has been carefully reviewed, transcription errors may be present. I apologize in advance for any inconvenience. Please contact me if further clarification is needed.

## 2024-05-19 NOTE — Progress Notes (Addendum)
 Neuropsychology / Psychotherapy Note  Jolynn DEL. [facility]  Physical Medicine and Rehabilitation    Patient: Erik Santana  DOB: Nov 19, 1990  MRN: [id number]  Provider: Evalene DOROTHA Riff, PsyD Modality & Individuals Present: The patient was seen in-person, unaccompanied, by the provider in the PM&R outpatient clinic office.  Location: [facility] Physical Medicine & Rehabilitation [address] [place] [address]  Date of Service: 04/04/2024 Duration of Service:  60 min for in-person, face to face, psychotherapy for adjustment to injury and psychiatric symptoms due to ABI within the context of premorbid anxiety/panic, depression, and history of trauma.  Record Review & Presenting Concerns (Per 02/23/24 intake):The patient is a 33 year old male referred for psychotherapy.  The patient has a past medical history, per records, of PTSD, ADHD, GAD. Hospital medical records (08/24/23) indicated the patient presented with left sided weakness, NIH of 7 on admission, and subsequent CT of the head showed  CT head showed ICH in right fronto parietal region likely to be embolic in nature. MRI showed unchanged appearance of intraparenchymal hematoma within the superior right frontal lobe. No mass lesion. Echo showed LVEF 55-60%, Patient underwent cerebral angiogram which showed no gross abnormalities identified in terms of aneurysms, dural AV fistula, A/V shunting, dissections, or  intraluminal filling defects.Dural venous sinuses grossly patent. Neurology recommended no antithrombotic due to acute ICH. Patient cleared for discharge to acute inpatient rehab.Neurology signed off. 09/22/2023 PM&R HPI indicated ... 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  Presented 08/21/2023 with acute onset of left-sided weakness as well as dizziness and headache.  He was able to call EMS himself.  EMS found him on the floor... CT of the head  showed a 3.9 x 2.4 x 4.1 cm parenchymal hemorrhage centered at the right frontal parietal junction.  No evidence of intraventricular extension or mass effect.  CT cervical spine negative.  CTA showed no intracranial large vessel occlusion or significant stenosis...MRI follow-up showed unchanged appearance of intraparenchymal hematoma within the superior right frontal lobe.  No mass lesion.  Arteriogram completed showing no gross abnormalities... [name] added for tobacco use.  Blood pressure controlled he has not required Cleviprex .  Therapy evaluations completed due to patient decreased functional mobility was admitted for a comprehensive rehab program. The patient met with Psychiatry with [facility] to establish care on 01/04/2024. Visit notes indicate the patient had been on Lexapro  but discontinued due to side effects. He had been off medications excluding muscle relaxer and buspirone  for about one month. Difficulties with panic/anxiety when in public settings became prominent. The patient reached out to PM&R for assistance with medications following that.  Visit notes from his PM&R visit on 02/08/2024 indicated he was started on trazodone  to help with sleep.  Prazosin  was discontinued due to dreams.  Valium  was resumed.  In notes indicated the physiatrist is reaching out to disability specialist for paperwork needs for his application.  Visit notes indicated spasticity and GERD were also present.   The patient presents with premorbid ADHD, anxiety, and history of trauma. 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 Difficulties with anxiety are prominent, and there is some concern for PTSD necessitating closer assessment. The patient demonstrates excellent insight into his difficulties and descriptions suggest  strong ability for perspective taking, but linked to  complex and challenging environment growing up. The patient is likely to benefit from individual therapy integrating mindfulness/relaxation strategies, along with EF cognitive rehab strategies if needed. Trauma treatment may be beneficial, and emotional suppression/avoidance may be factors exacerbating symptoms and warrant attention as well. Current session focused on obtaining background information and diagnostic information as well as establishing rapport and clarifying patient goals for treatment. Emotion regulation is primary focus, particularly with management of anxiety (cognitive and physiological Sx). Adjustment to injury work and more concrete goal setting (finding purpose/direction). Increased activity levels. 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  Adjustment to injury & Cognitive Establish health relationships (social/romantic), reduce engagement in prior interpersonal dynamic tendencies, live in accordance with personal beliefs/values.   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 Identify areas of cognitive difficulty, learn and utilize compensatory strategies. Anxiety/Panic Learn and utilize CBT restructuring techniques Learn and utilize relaxation/mindfulness strategies Reduce avoidance behaviors ADHD? - Evaluate - Coordinate with Emeline or psychiatry if needed.  Other trauma- and stressor related disorder (PTSD?)  Session Content: Goals focused on this session: Anxiety/panic, psychoeducation/relaxations strategies  Themes/Topics Discussed: - Explored patient experiences  of panic attack. Patient experienced acute Anx, sub-panic, in session in discussion. Provided de-escalation/active coping skill use with breathing exercise. Patient returned to baseline. Patient indicated patient stopped taking Keppra  on Sunday, and experiencing increasing anxiety since that time. Patient indicated d/c was at doctors approval.  - Writer indicated plan to follow up with Dr. Emeline Regarding pt response of increased anx, and follow up with patient afterwards.  - Pt indicated variable anxiety during week, with low of 4/10 and high of 9/10. Pt indicated difficulty utilizing relaxation start in the moment. Write reinforced need to practice prior, using championship game analogy.  - W/Frequency, patient indicated instances of sub-panic exacerbation 3-4x a day, and full panic attack 2x a day.  - Provided reinforcement regarding anxiety as aversive but not injurious, and discussed and explored course of an acute attack. Reinforced use of self-ressurance and reminding self its time limited. - Patient validated/normalized patient experiences.  - Explored/discussed sleep hygiene and provided additional recommendations in this regard.  - Patient did bring planner. This was reinforced. Patient did not read handouts on anx/panic.   Interventions and Techniques Used:  Empathic support, psychoeducation, relaxation/mindfulness Behavioral/Assessment  Alertness/Attention: Alert. Slightly tangential.  Orientation: Intact Language: Speech prosodic, fluent, well articulated. Receptive language intact.  Motor/Sensory: Ambulated independently and without issue.  Mood/Affect: Anxious. Congruent. Interaction: Cooperative, engaged. Genuine.  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 Likely racing thoughts as well.  Progress: Pt responded well to psychoeducation.  Barriers: Difficulties with attention/concentration. Possible emotion identification difficulties. Possible tendency  towards hyper-sensitivity to physiological state. Maybe secondary to TBI versus trauma response.  Suicide/Homicide/Aggression Risk: No SI or HI.   Session Assignment: - Bring planner - Do breathing exercise 1x/day (ongoing) - Read anx/panic psychoed handouts. (Ongoing) - CBT introduction.     Evalene DOROTHA Riff, PsyD Clinical Neuropsychology [address] [place], [place] [address] Main: [phone] Fax: [phone] [place]: [phone]   Consent and Confidentiality (reviewed 02/23/24): The nature, purpose, anticipated course, modalities, risks and benefits, limitations, alternative treatment options, of psychotherapy were reviewed with the patient. The patient was  informed of the expected benefits, potential risks, and limitations of treatment, as well as alternative treatment options. The patient was given the opportunity to ask questions. The patient understands that participation in psychotherapy is voluntary and may be discontinued at any time. Confidentiality of all information shared during psychotherapy is maintained in accordance with federal and state laws, including the [employer] and Accountability Act (HIPAA). Psychotherapy notes are not disclosed without specific patient authorization, except as required by law (e.g., risk of harm to self or others, court order, or mandatory reporting). The patient has been informed of the limits of confidentiality and the circumstances under which information may be disclosed. The patient acknowledged understanding of the above information and consents to participate in psychotherapy under these terms.  This report was generated using voice recognition software. While this document has been carefully reviewed, transcription errors may be present. I apologize in advance for any inconvenience. Please contact me if further clarification is needed.

## 2024-05-19 NOTE — Progress Notes (Signed)
 Neuropsychology / Psychotherapy Note  Jolynn DEL. [facility]  Physical Medicine and Rehabilitation    Patient: Erik Santana  DOB: 10-Jun-1991  MRN: [id number]  Provider: Evalene DOROTHA Riff, PsyD Modality & Individuals Present: The patient was seen in-person, unaccompanied, by the provider in the PM&R outpatient clinic office.  Location: [facility] Physical Medicine & Rehabilitation [address] [place] [address]  Date of Service: 03/15/2024 Duration of Service:  60 min for in-person, face to face, psychotherapy for adjustment to injury and psychiatric symptoms due to ABI within the context of premorbid anxiety/panic, depression, and history of trauma.  Record Review & Presenting Concerns (Per 02/23/24 intake):The patient is a 33 year old male referred for psychotherapy.  The patient has a past medical history, per records, of PTSD, ADHD, GAD. Hospital medical records (08/24/23) indicated the patient presented with left sided weakness, NIH of 7 on admission, and subsequent CT of the head showed  CT head showed ICH in right fronto parietal region likely to be embolic in nature. MRI showed unchanged appearance of intraparenchymal hematoma within the superior right frontal lobe. No mass lesion. Echo showed LVEF 55-60%, Patient underwent cerebral angiogram which showed no gross abnormalities identified in terms of aneurysms, dural AV fistula, A/V shunting, dissections, or  intraluminal filling defects.Dural venous sinuses grossly patent. Neurology recommended no antithrombotic due to acute ICH. Patient cleared for discharge to acute inpatient rehab.Neurology signed off. 09/22/2023 PM&R HPI indicated ... 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  Presented 08/21/2023 with acute onset of left-sided weakness as well as dizziness and headache.  He was able to call EMS himself.  EMS found him on the floor... CT of the head  showed a 3.9 x 2.4 x 4.1 cm parenchymal hemorrhage centered at the right frontal parietal junction.  No evidence of intraventricular extension or mass effect.  CT cervical spine negative.  CTA showed no intracranial large vessel occlusion or significant stenosis...MRI follow-up showed unchanged appearance of intraparenchymal hematoma within the superior right frontal lobe.  No mass lesion.  Arteriogram completed showing no gross abnormalities... [name] added for tobacco use.  Blood pressure controlled he has not required Cleviprex .  Therapy evaluations completed due to patient decreased functional mobility was admitted for a comprehensive rehab program. The patient met with Psychiatry with [facility] to establish care on 01/04/2024. Visit notes indicate the patient had been on Lexapro  but discontinued due to side effects. He had been off medications excluding muscle relaxer and buspirone  for about one month. Difficulties with panic/anxiety when in public settings became prominent. The patient reached out to PM&R for assistance with medications following that.  Visit notes from his PM&R visit on 02/08/2024 indicated he was started on trazodone  to help with sleep.  Prazosin  was discontinued due to dreams.  Valium  was resumed.  In notes indicated the physiatrist is reaching out to disability specialist for paperwork needs for his application.  Visit notes indicated spasticity and GERD were also present.   The patient presents with premorbid ADHD, anxiety, and history of trauma. 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 Difficulties with anxiety are prominent, and there is some concern for PTSD necessitating closer assessment. The patient demonstrates excellent insight into his difficulties and descriptions suggest  strong ability for perspective taking, but linked to  complex and challenging environment growing up. The patient is likely to benefit from individual therapy integrating mindfulness/relaxation strategies, along with EF cognitive rehab strategies if needed. Trauma treatment may be beneficial, and emotional suppression/avoidance may be factors exacerbating symptoms and warrant attention as well. Current session focused on obtaining background information and diagnostic information as well as establishing rapport and clarifying patient goals for treatment. Emotion regulation is primary focus, particularly with management of anxiety (cognitive and physiological Sx). Adjustment to injury work and more concrete goal setting (finding purpose/direction). Increased activity levels. 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   Diagnosis:  GAD (generalized anxiety disorder) [F41.1]  Non-traumatic intracranial hemorrhage (HCC) [I62.9]  PTSD (post-traumatic stress disorder) [F43.10] (r/o)  Tx Plan: Psychotherapy, weekly, in-person, 55 minute.  Adjustment to injury & Cognitive Establish health relationships (social/romantic), reduce engagement in prior interpersonal dynamic tendencies, live in accordance with personal beliefs/values.   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 Identify areas of cognitive difficulty, learn and utilize compensatory strategies. PTSD/Adjustment disorder(?) (Provider) Evaluate to clarify differential CPT if indicated.  Anxiety/Panic Learn and utilize CBT restructuring techniques Learn and utilize relaxation/mindfulness strategies Reduce avoidance behaviors ADHD? - Evaluate - Coordinate with Emeline or psychiatry if needed.   Session Content: Goals focused on this session: Establish/build rapport, Self-care/basic wellness, Anxiety/panic      Themes/Topics  Discussed: - Living with mother. Exercising regularly, long walks. Interested in increased exercise. Directed patient to follow up with referring regarding physical exercise treatment options or reach out to insurance to see if anything covered by them.  - Patient indicated strong appetite. Improved mobility. Is pleased with progress.  - Is cleared to drive.  - Pt indicated his understanding is that what he suspected were seizures may have been panic in hospital. Is still taking Keppra .  - Explored difficulties in adjustment with connections b/t patient identity emphasizing problem solver and independence and conflict with current feelings of limited efficacy. Provided support and validation. Discussed avenues for growth/improvement, and adjustment to revise lifestyle to possibly align more with core values (was previously less congruent than he desired).  - Discussed / provided psychoeducation regarding s/s panic. Introduced breathing exercise and grounding techniques.  - Pt indicated desire to communicate better in general, or correctly - explored but will need to be followed-up in future session.  Interventions and Techniques Used:  Empathic support, psychoeducation, relaxation/mindfulness Behavioral/Assessment  Alertness/Attention: Alert. Easily side-tracked/tangential.  Orientation: Intact Language: Speech prosodic, fluent, well articulated. Rate slightly increased. No indications of psychosis.  Nonverbal communication - bit anxious.  Motor/Sensory: Ambulated independently and without issue.  Mood/Affect: Mood slightly anxious, but good. Affect congruent.  Interaction: Cooperative, engaged. Genuine.  Cognition: Tangential, anxious.  Progress: Rapport built rapidly, receptive to recommendations/relaxation strategies Barriers: Difficulties with attention/concentration. Possible emotion identification difficulties, or maybe limited fluency.  Suicide/Homicide/Aggression Risk: No SI or HI.    Session Assignment: - Utilize relaxation strats learned in session  Goals for Next/Future Session: -Assess PTSD -CBT, possibly emotional identification if needed.  -Continue work with relaxation/mindfulness activities -Identify activities outside home. Increase opportunity for social interaction.    Evalene DOROTHA Riff, PsyD Clinical Neuropsychology [address] [place], [place] [address] Main: [phone] Fax: [phone] [place]: [phone]   Consent and Confidentiality (reviewed 02/23/24): The nature, purpose, anticipated course, modalities, risks and benefits, limitations, alternative treatment options, of psychotherapy were reviewed with the patient. The patient was informed of the expected  benefits, potential risks, and limitations of treatment, as well as alternative treatment options. The patient was given the opportunity to ask questions. The patient understands that participation in psychotherapy is voluntary and may be discontinued at any time. Confidentiality of all information shared during psychotherapy is maintained in accordance with federal and state laws, including the [employer] and Accountability Act (HIPAA). Psychotherapy notes are not disclosed without specific patient authorization, except as required by law (e.g., risk of harm to self or others, court order, or mandatory reporting). The patient has been informed of the limits of confidentiality and the circumstances under which information may be disclosed. The patient acknowledged understanding of the above information and consents to participate in psychotherapy under these terms.  This report was generated using voice recognition software. While this document has been carefully reviewed, transcription errors may be present. I apologize in advance for any inconvenience. Please contact me if further clarification is needed.

## 2024-05-19 NOTE — Progress Notes (Signed)
 Neuropsychology / Psychotherapy Note  Jolynn DEL. [facility]  Physical Medicine and Rehabilitation    Patient: Erik Santana  DOB: Apr 12, 1991  MRN: [id number]  Provider: Evalene DOROTHA Riff, PsyD Modality & Individuals Present: The patient was seen in-person, unaccompanied, by the provider in the PM&R outpatient clinic office.  Location: [facility] Physical Medicine & Rehabilitation [address] [place] [address]  Date of Service: 03/28/2024 Duration of Service:  60 min for in-person, face to face, psychotherapy for adjustment to injury and psychiatric symptoms due to ABI within the context of premorbid anxiety/panic, depression, and history of trauma.  Record Review & Presenting Concerns (Per 02/23/24 intake):The patient is a 33 year old male referred for psychotherapy.  The patient has a past medical history, per records, of PTSD, ADHD, GAD. Hospital medical records (08/24/23) indicated the patient presented with left sided weakness, NIH of 7 on admission, and subsequent CT of the head showed  CT head showed ICH in right fronto parietal region likely to be embolic in nature. MRI showed unchanged appearance of intraparenchymal hematoma within the superior right frontal lobe. No mass lesion. Echo showed LVEF 55-60%, Patient underwent cerebral angiogram which showed no gross abnormalities identified in terms of aneurysms, dural AV fistula, A/V shunting, dissections, or  intraluminal filling defects.Dural venous sinuses grossly patent. Neurology recommended no antithrombotic due to acute ICH. Patient cleared for discharge to acute inpatient rehab.Neurology signed off. 09/22/2023 PM&R HPI indicated ... 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  Presented 08/21/2023 with acute onset of left-sided weakness as well as dizziness and headache.  He was able to call EMS himself.  EMS found him on the floor... CT of the head  showed a 3.9 x 2.4 x 4.1 cm parenchymal hemorrhage centered at the right frontal parietal junction.  No evidence of intraventricular extension or mass effect.  CT cervical spine negative.  CTA showed no intracranial large vessel occlusion or significant stenosis...MRI follow-up showed unchanged appearance of intraparenchymal hematoma within the superior right frontal lobe.  No mass lesion.  Arteriogram completed showing no gross abnormalities... [name] added for tobacco use.  Blood pressure controlled he has not required Cleviprex .  Therapy evaluations completed due to patient decreased functional mobility was admitted for a comprehensive rehab program. The patient met with Psychiatry with [facility] to establish care on 01/04/2024. Visit notes indicate the patient had been on Lexapro  but discontinued due to side effects. He had been off medications excluding muscle relaxer and buspirone  for about one month. Difficulties with panic/anxiety when in public settings became prominent. The patient reached out to PM&R for assistance with medications following that.  Visit notes from his PM&R visit on 02/08/2024 indicated he was started on trazodone  to help with sleep.  Prazosin  was discontinued due to dreams.  Valium  was resumed.  In notes indicated the physiatrist is reaching out to disability specialist for paperwork needs for his application.  Visit notes indicated spasticity and GERD were also present.   The patient presents with premorbid ADHD, anxiety, and history of trauma. 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 Difficulties with anxiety are prominent, and there is some concern for PTSD necessitating closer assessment. The patient demonstrates excellent insight into his difficulties and descriptions suggest  strong ability for perspective taking, but linked to  complex and challenging environment growing up. The patient is likely to benefit from individual therapy integrating mindfulness/relaxation strategies, along with EF cognitive rehab strategies if needed. Trauma treatment may be beneficial, and emotional suppression/avoidance may be factors exacerbating symptoms and warrant attention as well. Current session focused on obtaining background information and diagnostic information as well as establishing rapport and clarifying patient goals for treatment. Emotion regulation is primary focus, particularly with management of anxiety (cognitive and physiological Sx). Adjustment to injury work and more concrete goal setting (finding purpose/direction). Increased activity levels. 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  Adjustment to injury & Cognitive Establish health relationships (social/romantic), reduce engagement in prior interpersonal dynamic tendencies, live in accordance with personal beliefs/values.   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 Identify areas of cognitive difficulty, learn and utilize compensatory strategies. Anxiety/Panic Learn and utilize CBT restructuring techniques Learn and utilize relaxation/mindfulness strategies Reduce avoidance behaviors ADHD? - Evaluate - Coordinate with Emeline or psychiatry if needed.  Other trauma- and stressor related disorder (PTSD?)  Session Content: Goals focused on this session: Anxiety/panic, emotion regulation, psychoeducation/relaxations strategies  Themes/Topics Discussed: - Patient  described experiences in interim including instances of likely panic attack. Discussed this with patient and provided psychoeducation and reinforced regular use of breathing exercises. Reassurance of absence of danger reinforced despite subjective experience of panic. Pt interpret reaction as concerning for reoccurrence of ICH.  - Discussed patient thoughts regarding cause of ICH. Pt blaming self and lifestyle for event. Challenged patient assertion, noting retrospective perspective =/= fair judgment of self given knowledge at the time.  - Pt indicated utilizing valium  only within context of panic, but desire not to utilize at all.  - Pt discussed Hx tendencies to not show emotions and feels like exacerbation is all of it coming out (pt. Hx trauma is sig.) - Provided psychoeducation regarding trauma. Completed questionnaires and discussed results with patient.   - Discussed increasing structure and between-session assignments.   PCL-5 - Total score 32 - Cutoff ~31-33: Intrusion criteria, Negative alternations, marked alterations in arousal or reactivity endorsed and consistent with patient report. Avoidance criteria not met based on endorsements and follow-up interview.   BDI-2 Total score 23, moderate severity Dep Sx. Broad spectrum of Sx types (but no SI), but rated at a (1), excluding fatigue (2).   BAI Total Score 28, severe (lower end of range) Anx Sx. Highest ratings = moderate. Items rated at moderate included unable to relax, fear of worst happening, dizzy/lightheaded, Nervous, fear of losing control, fear of dying, sweating.   Interventions and Techniques Used:  Empathic support, psychoeducation, relaxation/mindfulness Behavioral/Assessment  Alertness/Attention: Alert. Slightly tangential.  Orientation: Intact Language: Speech prosodic, fluent, well articulated. Receptive language intact.  Motor/Sensory: Ambulated independently and without issue.  Mood/Affect: Positive to anxious.  Congruent Interaction: Cooperative, engaged. Genuine.  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 Likely racing thoughts as well.  Progress: Pt responded well to psychoeducation.  Barriers: Difficulties with attention/concentration. Possible emotion identification difficulties. Possible tendency towards hyper-sensitivity to physiological state. Maybe secondary to TBI versus trauma response.  Suicide/Homicide/Aggression Risk: No SI or HI.   Session Assignment: - Bring planner - Do breathing exercise 1x/day - Read anx/panic psychoed handouts.  Evalene DOROTHA Riff, PsyD Clinical Neuropsychology [address] [place], [place] [address] Main: [phone] Fax: [phone] [place]: [phone]   Consent and Confidentiality (reviewed 02/23/24): The nature, purpose, anticipated course, modalities, risks and benefits, limitations, alternative treatment options, of psychotherapy were reviewed with the patient. The patient was informed of the expected benefits, potential risks, and limitations of treatment, as well as alternative treatment options. The patient was given the opportunity to ask questions. The patient understands that participation in psychotherapy is voluntary and may be discontinued at any time. Confidentiality of all information shared during psychotherapy is maintained in accordance with federal and state laws, including the [employer] and Accountability Act (HIPAA). Psychotherapy notes are not disclosed without specific patient authorization, except as required by law (e.g., risk of harm to self or others, court order, or mandatory reporting). The patient has been informed of the limits of confidentiality and the circumstances under which information may be disclosed. The patient acknowledged understanding of the above information and consents to participate in psychotherapy under these terms.  This report was generated using voice  recognition software. While this document has been carefully reviewed, transcription errors may be present. I apologize in advance for any inconvenience. Please contact me if further clarification is needed.

## 2024-05-19 NOTE — Progress Notes (Signed)
 Neuropsychology / Psychotherapy Note  Jolynn DEL. [facility]  Physical Medicine and Rehabilitation    Patient: Erik Santana  DOB: Jan 25, 1991  MRN: [id number]  Provider: Evalene DOROTHA Riff, PsyD Modality & Individuals Present: The patient was seen in-person, unaccompanied, by the provider in the PM&R outpatient clinic office.  Location: [facility] Physical Medicine & Rehabilitation [address] [place] [address]  Date of Service: 04/26/2024 Duration of Service:  60 min for in-person, face to face, psychotherapy for adjustment to injury and psychiatric symptoms due to ABI within the context of premorbid anxiety/panic, depression, and history of trauma.  Record Review & Presenting Concerns (Per 02/23/24 intake):The patient is a 33 year old male referred for psychotherapy.  The patient has a past medical history, per records, of PTSD, ADHD, GAD. Hospital medical records (08/24/23) indicated the patient presented with left sided weakness, NIH of 7 on admission, and subsequent CT of the head showed  CT head showed ICH in right fronto parietal region likely to be embolic in nature. MRI showed unchanged appearance of intraparenchymal hematoma within the superior right frontal lobe. No mass lesion. Echo showed LVEF 55-60%, Patient underwent cerebral angiogram which showed no gross abnormalities identified in terms of aneurysms, dural AV fistula, A/V shunting, dissections, or  intraluminal filling defects.Dural venous sinuses grossly patent. Neurology recommended no antithrombotic due to acute ICH. Patient cleared for discharge to acute inpatient rehab.Neurology signed off. 09/22/2023 PM&R HPI indicated ... 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  Presented 08/21/2023 with acute onset of left-sided weakness as well as dizziness and headache.  He was able to call EMS himself.  EMS found him on the floor... CT of the head  showed a 3.9 x 2.4 x 4.1 cm parenchymal hemorrhage centered at the right frontal parietal junction.  No evidence of intraventricular extension or mass effect.  CT cervical spine negative.  CTA showed no intracranial large vessel occlusion or significant stenosis...MRI follow-up showed unchanged appearance of intraparenchymal hematoma within the superior right frontal lobe.  No mass lesion.  Arteriogram completed showing no gross abnormalities... [name] added for tobacco use.  Blood pressure controlled he has not required Cleviprex .  Therapy evaluations completed due to patient decreased functional mobility was admitted for a comprehensive rehab program. The patient met with Psychiatry with [facility] to establish care on 01/04/2024. Visit notes indicate the patient had been on Lexapro  but discontinued due to side effects. He had been off medications excluding muscle relaxer and buspirone  for about one month. Difficulties with panic/anxiety when in public settings became prominent. The patient reached out to PM&R for assistance with medications following that.  Visit notes from his PM&R visit on 02/08/2024 indicated he was started on trazodone  to help with sleep.  Prazosin  was discontinued due to dreams.  Valium  was resumed.  In notes indicated the physiatrist is reaching out to disability specialist for paperwork needs for his application.  Visit notes indicated spasticity and GERD were also present.   The patient presents with premorbid ADHD, anxiety, and history of trauma. 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 Difficulties with anxiety are prominent, and there is some concern for PTSD necessitating closer assessment. The patient demonstrates excellent insight into his difficulties and descriptions suggest  strong ability for perspective taking, but linked to  complex and challenging environment growing up. The patient is likely to benefit from individual therapy integrating mindfulness/relaxation strategies, along with EF cognitive rehab strategies if needed. Trauma treatment may be beneficial, and emotional suppression/avoidance may be factors exacerbating symptoms and warrant attention as well. Current session focused on obtaining background information and diagnostic information as well as establishing rapport and clarifying patient goals for treatment. Emotion regulation is primary focus, particularly with management of anxiety (cognitive and physiological Sx). Adjustment to injury work and more concrete goal setting (finding purpose/direction). Increased activity levels. 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  Adjustment to injury & Cognitive Establish health relationships (social/romantic), reduce engagement in prior interpersonal dynamic tendencies, live in accordance with personal beliefs/values.   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 Identify areas of cognitive difficulty, learn and utilize compensatory strategies. Anxiety/Panic Learn and utilize CBT restructuring techniques Learn and utilize relaxation/mindfulness strategies Reduce avoidance behaviors ADHD? - Evaluate - Coordinate with Emeline or psychiatry if needed.  Other trauma- and stressor related disorder (PTSD?)  Session Content: Goals focused on this session: Anxiety/panic, CBT  Themes/Topics Discussed: - Celebrated birthday.  - Positive outcome when went to play  basketball. Was able to run when given enough space. Reinforced this and explored patient thoughts/feelings about this.  - Patient reported difficulties with sleep, and reported twiching when trying to sleep that concerned him for seizures. Provider offered that this is something that may be accounted for by benign processes (hypnic jerks).  - Patient was more socially active and engaging with others over week, doing more than one activity.  - He reported some increased anxiety and feeling overstimulated at time. Writer provided encouragement to, when able to tolerate, continue with increased activity as this can be beneficial for reduction in anxiety via exposure in the long term.  - Discussed course of TBI recovery and provided psychoeducation.  - Patient struggle with articulation of emotional responses, typically struggling to describe thoughts associated with his feelings.  - Discussed Bipolar disorder. Patient assessed and does not have any prior history of episodes suggestive of mania or hypomania. Patient agreed with providers assessment once psychoeducation about Bipolar disorder was provided.  -Patient may have Panic Disorder. Additional evaluation/follow-up needed. Difficulties in progressing related to patient difficulty articulating thoughts/perceptions underlying negative emotional or anxiety related experiences.  - Patient interested in evaluation for ADHD, which will be assessed at future session. Interventions and Techniques Used:  Empathic support, goal setting/problem solving exercise, psychoeducation, adjustment to marked life transition.  Behavioral/Assessment  Alertness/Attention: Alert. Attentive Orientation: Intact Language: Speech prosodic, fluent, well articulated. Receptive language intact.  Motor/Sensory: Ambulated independently and without issue.  Mood/Affect: Euthymic. Affect congruent.  Interaction: Cooperative, engaged. Genuine.  Cognition: Cognitively appeared more  lucid/less tangential relative to baseline.  Progress: Pt responded well to psychoeducation.  Barriers: Difficulties with attention/concentration. Possible emotion identification difficulties (. Freq interpret all physio as pathology/physical health concern. Suicide/Homicide/Aggression Risk: No SI or HI.   Session Assignment: - Bring planner - Continue to engage in activities outside the home  Revisit  - Do breathing exercise 1x/day (ongoing) - Read anx/panic psychoed handouts. (Ongoing) - CBT thought-feeling connection exercise/worsksheet (ongoing)    Evalene DOROTHA Riff, PsyD Clinical Neuropsychology [address] [place], [place] [address] Main: [phone] Fax: [phone] [place]: [phone]   Consent and Confidentiality (reviewed 02/23/24): The nature, purpose, anticipated course, modalities, risks and benefits, limitations, alternative treatment options, of psychotherapy were reviewed with the patient. The patient was informed of the expected benefits, potential risks, and limitations of treatment, as well as alternative treatment options. The patient was given the opportunity to ask questions. The patient understands that participation in psychotherapy is voluntary and may be discontinued at any time. Confidentiality of all information shared during psychotherapy is maintained in accordance with federal and state laws, including the [employer] and Accountability Act (HIPAA). Psychotherapy notes are not disclosed without specific patient authorization, except as required by law (e.g., risk of harm to self or others, court order, or mandatory reporting). The patient has been informed of the limits of confidentiality and the circumstances under which information may be disclosed. The patient acknowledged understanding of the above information and consents to participate in psychotherapy under these terms.  This report was generated using voice recognition software.  While this document has been carefully reviewed, transcription errors may be present. I apologize in advance for any inconvenience. Please contact me if further clarification is needed.

## 2024-05-22 ENCOUNTER — Other Ambulatory Visit: Payer: Self-pay

## 2024-05-22 NOTE — Patient Instructions (Signed)
 Visit Information  Mr. Dib was given information about Medicaid Managed Care team care coordination services as a part of their [facility] Medicaid benefit.   If you would like to schedule transportation through your [facility] plan, please call the following number at least 2 days in advance of your appointment: [phone].   You can also use the MTM portal or MTM mobile app to manage your rides. Reimbursement for transportation is available through [facility]! For the portal, please go to [email or web address]  Call the [facility] Crisis Line at [phone], at any time, 24 hours a day, 7 days a week. If you are in danger or need immediate medical attention call 911.   Care plan and visit instructions communicated with the patient verbally today. Patient agrees to receive a copy in MyChart. Active MyChart status and patient understanding of how to access instructions and care plan via MyChart confirmed with patient.     Telephone follow up appointment with Managed Medicaid care management team member scheduled for: 06/05/2024 at 1pm  Erik Santana, [place]/VBCI - [facility] Social Worker [phone]   Following is a copy of your plan of care:  There are no care plans that you recently modified to display for this patient.

## 2024-05-22 NOTE — Patient Outreach (Signed)
 Social Drivers of Health  Community Resource and Care Coordination Visit Note   05/22/2024  Name: Erik Santana. MRN: [id number] DOB:December 19, 1990  Situation: Referral received for [facility] needs assessment and assistance related to Transportation SSDI/SSI application. I obtained verbal consent from Patient.  Visit completed with Patient on the phone.   Background:   SDOH Interventions Today    Flowsheet Row Most Recent Value  SDOH Interventions   Food Insecurity Interventions Intervention Not Indicated  [patient states is ok with food.]  Housing Interventions Intervention Not Indicated  [pt is staying with grandfather - no issues at this time.]  Transportation Interventions Patient Resources (Friends/Family), Payor Benefit  [BSW has informed patient of [employer] medicaid transportation.]  Utilities Interventions Intervention Not Indicated  Financial Strain Interventions Intervention Not Indicated     Assessment: Patient states he has called [employer] regarding his SSI application and states that it is pending and not given more information on it. Pt plans to hire a disability lawyer.  BSW will attempt to call and get more information on his application.   [facility] and states he will have to continue waiting. Pt reports he may just wait until Jan to begin PT again since his benefits reset at the new year.    Goals Addressed             This Visit's Progress    BSW goal       Current SDOH Barriers:  Transportation Limited access to food SSI claim Dental need FNS re certification [facility]  Interventions: Patient interviewed and appropriate screenings performed Referred patient to community resources  Provided patient with information about out of the garden food project. Advised patient to complete FNS recertification ASAP and turn in to DSS before 11/15 to avoid interruption in FNS benefit past the 11/30. Advised pt he  can renew online as well and encouraged him to reach out to caseworker [name] for assistance. Pt is seeking physical therapy through [facility] - on wait list for several weeks now.  Provided pt with list of dental providers. 05/22/2024 Pt was informed by BSW that [facility]. Patient confirmed he also received a call from them last week.         Recommendation:   attend all scheduled provider appointments call for transportation assistance at least one week before appointments  Follow Up Plan:   Telephone follow up appointment date/time:  06/05/2024 at 1pm  [name], BSW [place]/VBCI - [facility] Social Worker [phone]

## 2024-05-29 NOTE — Addendum Note (Signed)
 Addended by: EMELINE SEARCH on: 05/29/2024 10:14 PM   Modules accepted: Orders

## 2024-06-01 ENCOUNTER — Encounter: Payer: Self-pay | Admitting: Licensed Clinical Social Worker

## 2024-06-01 ENCOUNTER — Telehealth: Payer: Self-pay | Admitting: Licensed Clinical Social Worker

## 2024-06-01 NOTE — Patient Instructions (Signed)
 [name]. - I am sorry I was unable to reach you today for our scheduled appointment. I work with Jaycee Greig PARAS, NP and am calling to support your healthcare needs. Please contact me at [phone] at your earliest convenience. I look forward to speaking with you soon.   Thank you,  [name], LCSW [place]  [facility], [facility] Clinical Social Worker Direct Dial: [phone]  Fax: [phone] Website: [email or web address] 5:04 PM

## 2024-06-04 ENCOUNTER — Encounter: Admitting: Psychology

## 2024-06-04 NOTE — Telephone Encounter (Signed)
 If you could copy that letter for the renewal with a new date, that would be great! Thank you!

## 2024-06-05 ENCOUNTER — Other Ambulatory Visit: Payer: Self-pay

## 2024-06-05 ENCOUNTER — Encounter: Payer: Self-pay | Admitting: Physical Medicine and Rehabilitation

## 2024-06-05 NOTE — Patient Instructions (Signed)
 Visit Information  Thank you for taking time to visit with me today. Please don't hesitate to contact me if I can be of assistance to you before our next scheduled appointment.  Your next care management appointment is by telephone on 06/11/2024 at 3:30pm  Telephone follow up appointment date/time:  06/11/2024 at 3:30pm  Please call the care guide team at [phone] if you need to cancel, schedule, or reschedule an appointment.   Please call the Suicide and Crisis Lifeline: 988 go to [facility] [address] [place] [phone]) call 911 if you are experiencing a Mental Health or Behavioral Health Crisis or need someone to talk to.  [name], BSW [place]/VBCI - [employer] Social Worker [phone]

## 2024-06-05 NOTE — Patient Outreach (Signed)
 Social Drivers of Health  Community Resource and Care Coordination Visit Note   06/05/2024  Name: Erik Santana. MRN: [id number] DOB:January 17, 1991  Situation: Referral received for [facility] needs assessment and assistance related to Transportation Financial Strain  SSI application. I obtained verbal consent from Patient.  Visit completed with Patient on the phone.   Background:   SDOH Interventions Today    Flowsheet Row Most Recent Value  SDOH Interventions   Food Insecurity Interventions Other (Comment)  [patient is working to recertify his FNS. Patient was encouraged to reach out to [facility] case worker to renew application or get it transferred to [place].]  Financial Strain Interventions Other (Comment)  [Applicant has applied for SSI but has not been approved. Appication is pending. Applicant was encouraged to reach out to disability advocacy center to get assistance with completing a form.]     Assessment:   Goals Addressed             This Visit's Progress    BSW goal       Current SDOH Barriers:  Transportation Limited access to food SSI claim Dental need FNS re certification [facility]  Interventions: Patient interviewed and appropriate screenings performed Referred patient to community resources  Provided patient with information about out of the garden food project. Advised patient to complete FNS recertification ASAP and turn in to DSS before 11/15 to avoid interruption in FNS benefit past the 11/30. Advised pt he can renew online as well and encouraged him to reach out to caseworker [name] for assistance. Pt is seeking physical therapy through [facility] - on wait list for several weeks now.  Provided pt with list of dental providers. 05/22/2024 Pt was informed by BSW that [facility]. Patient confirmed he also received a call from them last week.  06/05/2024 BSW and pt called [employer]  [place] field office together and were unable to speak with an agent. Patient states he got a letter from [facility] requesting him to complete a form but applicant is not sure how to complete. BSW encouraged pt to call [facility] who helped him apply for guidance.         Recommendation:   attend all scheduled provider appointments call for transportation assistance at least one week before appointments ask for help if you don't understand your health insurance benefits  Follow Up Plan:   Telephone follow up appointment date/time:  06/11/2024 at 3:30pm  Erik Santana, BSW [place]/VBCI - [facility] Social Worker [phone]

## 2024-06-08 ENCOUNTER — Encounter: Attending: Physical Medicine and Rehabilitation | Admitting: Psychology

## 2024-06-08 DIAGNOSIS — M792 Neuralgia and neuritis, unspecified: Secondary | ICD-10-CM | POA: Diagnosis present

## 2024-06-08 DIAGNOSIS — F4389 Other reactions to severe stress: Secondary | ICD-10-CM | POA: Diagnosis present

## 2024-06-08 DIAGNOSIS — I629 Nontraumatic intracranial hemorrhage, unspecified: Secondary | ICD-10-CM | POA: Diagnosis present

## 2024-06-08 DIAGNOSIS — R569 Unspecified convulsions: Secondary | ICD-10-CM | POA: Diagnosis present

## 2024-06-08 DIAGNOSIS — F411 Generalized anxiety disorder: Secondary | ICD-10-CM | POA: Insufficient documentation

## 2024-06-08 DIAGNOSIS — G8194 Hemiplegia, unspecified affecting left nondominant side: Secondary | ICD-10-CM | POA: Diagnosis present

## 2024-06-08 DIAGNOSIS — F419 Anxiety disorder, unspecified: Secondary | ICD-10-CM | POA: Diagnosis present

## 2024-06-08 DIAGNOSIS — R252 Cramp and spasm: Secondary | ICD-10-CM | POA: Insufficient documentation

## 2024-06-08 NOTE — Progress Notes (Signed)
 Neuropsychology / Psychotherapy Note  [facility] DEL. [facility]  Physical Medicine and Rehabilitation    Patient: Erik Santana  DOB: 1991/01/05  MRN: [id number]  Provider: Evalene DOROTHA Riff, PsyD Modality & Individuals Present: The patient was seen in-person, unaccompanied, by the provider in the PM&R outpatient clinic office.  Location: [facility] Physical Medicine & Rehabilitation [address] [place] [address]  Date of Service: 06/08/2024 Duration of Service:  60 min for in-person, face to face, psychotherapy for adjustment to injury and psychiatric symptoms due to ABI within the context of premorbid anxiety/panic, depression, and history of trauma.  Record Review & Presenting Concerns (Per 02/23/24 intake):The patient is a 33 year old male referred for psychotherapy.  The patient has a past medical history, per records, of PTSD, ADHD, GAD. Hospital medical records (08/24/23) indicated the patient presented with left sided weakness, NIH of 7 on admission, and subsequent CT of the head showed  CT head showed ICH in right fronto parietal region likely to be embolic in nature. MRI showed unchanged appearance of intraparenchymal hematoma within the superior right frontal lobe. No mass lesion. Echo showed LVEF 55-60%, Patient underwent cerebral angiogram which showed no gross abnormalities identified in terms of aneurysms, dural AV fistula, A/V shunting, dissections, or  intraluminal filling defects.Dural venous sinuses grossly patent. Neurology recommended no antithrombotic due to acute ICH. Patient cleared for discharge to acute inpatient rehab.Neurology signed off. 09/22/2023 PM&R HPI indicated ... 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  Presented 08/21/2023 with acute onset of left-sided weakness as well as dizziness and headache.  He was able to call EMS himself.  EMS found him on the floor... CT of the head  showed a 3.9 x 2.4 x 4.1 cm parenchymal hemorrhage centered at the right frontal parietal junction.  No evidence of intraventricular extension or mass effect.  CT cervical spine negative.  CTA showed no intracranial large vessel occlusion or significant stenosis...MRI follow-up showed unchanged appearance of intraparenchymal hematoma within the superior right frontal lobe.  No mass lesion.  Arteriogram completed showing no gross abnormalities... [name] added for tobacco use.  Blood pressure controlled he has not required Cleviprex .  Therapy evaluations completed due to patient decreased functional mobility was admitted for a comprehensive rehab program. The patient met with Psychiatry with [facility] to establish care on 01/04/2024. Visit notes indicate the patient had been on Lexapro  but discontinued due to side effects. He had been off medications excluding muscle relaxer and buspirone  for about one month. Difficulties with panic/anxiety when in public settings became prominent. The patient reached out to PM&R for assistance with medications following that.  Visit notes from his PM&R visit on 02/08/2024 indicated he was started on trazodone  to help with sleep.  Prazosin  was discontinued due to dreams.  Valium  was resumed.  In notes indicated the physiatrist is reaching out to disability specialist for paperwork needs for his application.  Visit notes indicated spasticity and GERD were also present.   The patient presents with premorbid ADHD, anxiety, and history of trauma. 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 Difficulties with anxiety are prominent, and there is some concern for PTSD necessitating closer assessment. The patient demonstrates excellent insight into his difficulties and descriptions suggest  strong ability for perspective taking, but linked to  complex and challenging environment growing up. The patient is likely to benefit from individual therapy integrating mindfulness/relaxation strategies, along with EF cognitive rehab strategies if needed. Trauma treatment may be beneficial, and emotional suppression/avoidance may be factors exacerbating symptoms and warrant attention as well. Current session focused on obtaining background information and diagnostic information as well as establishing rapport and clarifying patient goals for treatment. Emotion regulation is primary focus, particularly with management of anxiety (cognitive and physiological Sx). Adjustment to injury work and more concrete goal setting (finding purpose/direction). Increased activity levels. 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  Adjustment to injury & Cognitive Establish health relationships (social/romantic), reduce engagement in prior interpersonal dynamic tendencies, live in accordance with personal beliefs/values.   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 Identify areas of cognitive difficulty, learn and utilize compensatory strategies. Anxiety/Panic Learn and utilize CBT restructuring techniques Learn and utilize relaxation/mindfulness strategies Reduce avoidance behaviors ADHD? - Evaluate - Coordinate with Emeline or psychiatry if needed.  Other trauma- and stressor related disorder (PTSD? PD?)  Session Content: Goals focused on this session: Psychoeducation, emotion regulation, interpersonal skills/social communication,   Themes/Topics Discussed: -  Pt feels that he is progressing with emotion regulation. Described indications of reduced emotional avoidance.  - Pt to attend sister wedding. Processed thoughts and feelings, including worries about interactions during this.  - Discussed in-depth physiological components of panic attack, connecting with current symptoms patient experiences.  - Pt does have some social interactions, but primarily within context of online/gaming.  - Reinforced previous work on cognitive distortions/biases, validated as potentially adaptive in the past but not necessarily effective in the present.  - Motivational interviewing to reduce emotional avoidance.  - Pt interest in revisiting ADHD assessment Interventions and Techniques Used: Psychoeducation, CBT, Motivational interviewing.  Behavioral/Assessment  Alertness/Attention: Alert. Attentive Orientation: Intact Language: Speech prosodic, fluent, well articulated. Receptive language intact.  Motor/Sensory: Ambulated independently and without issue.  Mood/Affect: Irritability to euthymic, congruent with patient perspective/understandable. Mood and Affect congruent. Mood/affect returned to baseline prior to session.  Interaction: Cooperative, engaged. Genuine.  Progress: Pt responded well to psychoeducation.  Barriers: Difficulties with attention/concentration.SABRA Hollings[place] interpret all physio as pathology/physical health concern. Suicide/Homicide/Aggression Risk: No SI or HI.   Session Assignment: - Meet next week for ADHD interview/evaluation   Revisit  - Do breathing exercise 1x/day (ongoing) - Read anx/panic psychoed handouts. (Ongoing) - CBT thought-feeling connection exercise/worsksheet (ongoing) - Conversation with child-self exercise - Bring planner - Continue to engage in activities outside the home    Evalene DOROTHA Riff, PsyD Clinical Neuropsychology [address] [place], [place] [address] Main: [phone] Fax: [phone] [place]:  [phone]   Consent and Confidentiality (reviewed 02/23/24): The nature, purpose, anticipated course, modalities, risks and benefits, limitations, alternative treatment options, of psychotherapy were reviewed with the patient. The patient was informed of the expected benefits, potential risks, and limitations of treatment, as well as alternative treatment options. The patient was given the opportunity to ask questions. The patient understands that participation in psychotherapy is voluntary and may be discontinued at any time. Confidentiality of all information shared during psychotherapy is maintained in accordance with federal and state laws, including the [employer] and Accountability Act (HIPAA). Psychotherapy notes are  not disclosed without specific patient authorization, except as required by law (e.g., risk of harm to self or others, court order, or mandatory reporting). The patient has been informed of the limits of confidentiality and the circumstances under which information may be disclosed. The patient acknowledged understanding of the above information and consents to participate in psychotherapy under these terms.  This report was generated using voice recognition software. While this document has been carefully reviewed, transcription errors may be present. I apologize in advance for any inconvenience. Please contact me if further clarification is needed.

## 2024-06-09 ENCOUNTER — Other Ambulatory Visit: Payer: Self-pay | Admitting: Family

## 2024-06-09 DIAGNOSIS — J3089 Other allergic rhinitis: Secondary | ICD-10-CM

## 2024-06-11 ENCOUNTER — Telehealth: Payer: Self-pay

## 2024-06-12 NOTE — Telephone Encounter (Signed)
 Complete

## 2024-06-13 ENCOUNTER — Encounter: Admitting: Psychology

## 2024-06-13 ENCOUNTER — Other Ambulatory Visit: Payer: Self-pay

## 2024-06-13 NOTE — Patient Instructions (Signed)
 Visit Information  [name] was given information about Medicaid Managed Care team care coordination services as a part of their [facility] Medicaid benefit.   If you would like to schedule transportation through your [facility] plan, please call the following number at least 2 days in advance of your appointment: [phone].   You can also use the MTM portal or MTM mobile app to manage your rides. Reimbursement for transportation is available through [facility]! For the portal, please go to [email or web address]  Call the [facility] Crisis Line at [phone], at any time, 24 hours a day, 7 days a week. If you are in danger or need immediate medical attention call 911.   Care plan and visit instructions communicated with the patient verbally today. Patient agrees to receive a copy in MyChart. Active MyChart status and patient understanding of how to access instructions and care plan via MyChart confirmed with patient.     Telephone follow up appointment with Managed Medicaid care management team member scheduled for: 07/03/2024 at 3:30pm  Erik Santana, BSW [place]/VBCI - [facility] Social Worker [phone]   Following is a copy of your plan of care:   Goals Addressed             This Visit's Progress    BSW goal       Current SDOH Barriers:  Transportation Limited access to food SSI claim Dental need FNS re certification [facility] - [facility]  Interventions: Patient interviewed and appropriate screenings performed Referred patient to community resources  Provided patient with information about out of the garden food project. Advised patient to complete FNS recertification ASAP and turn in to DSS before 11/15 to avoid interruption in FNS benefit past the 11/30. Advised pt he can renew online as well and encouraged him to reach out to caseworker [name] for assistance. Pt is seeking physical therapy through [facility] - on wait  list for several weeks now.  Provided pt with list of dental providers. 05/22/2024 Pt was informed by BSW that he is still on the [facility]. Patient confirmed he also received a call from them last week.  06/05/2024 BSW and pt called [employer] [place] field office together and were unable to speak with an agent. Patient states he got a letter from [facility] requesting him to complete a form but applicant is not sure how to complete. BSW encouraged pt to call [facility] who helped him apply for guidance. 06/13/2024 Pt and BSW called DAC and spoke with caseworker [name]. She informs Collett she will call SSA re his pending application and his need to update his home address and phone number with them. Pt was encouraged to complete SSA form and mail out asap. Pt intends to do this today.

## 2024-06-13 NOTE — Patient Outreach (Signed)
 Social Drivers of Health  Community Resource and Care Coordination Visit Note   06/13/2024  Name: Erik Santana. MRN: [id number] DOB:June 17, 1991  Situation: Referral received for [facility] needs assessment and assistance related to Transportation Financial Strain . I obtained verbal consent from Patient.  Visit completed with Patient on the phone.   Background:   SDOH Interventions Today    Flowsheet Row Most Recent Value  SDOH Interventions   Food Insecurity Interventions Intervention Not Indicated  [no issues with food at this time. Pt will turn in letter from doctor for FNS this week.]  Housing Interventions Intervention Not Indicated  [not an issue right now - staying with grandfather.]  Transportation Interventions Patient Resources (Friends/Family)  [declined SCAT due to financial strain. Grandfather provides transportation when able to.]  Utilities Interventions Intervention Not Indicated  Financial Strain Interventions --  Erik Santana has a pending SSI application. Pt reached out to [facility] and was encouraged to complete paperwork. Caseworker [name] will be calling SSA to confirm updated pt address and phone number.]     Assessment:   Goals Addressed             This Visit's Progress    BSW goal       Current SDOH Barriers:  Transportation Limited access to food SSI claim Dental need FNS re certification [facility]  Interventions: Patient interviewed and appropriate screenings performed Referred patient to community resources  Provided patient with information about out of the garden food project. Advised patient to complete FNS recertification ASAP and turn in to DSS before 11/15 to avoid interruption in FNS benefit past the 11/30. Advised pt he can renew online as well and encouraged him to reach out to caseworker [name] for assistance. Pt is seeking physical therapy through [facility] - on wait list for several weeks now.  Provided pt with list  of dental providers. 05/22/2024 Pt was informed by BSW that he is still [facility]. Patient confirmed he also received a call from them last week.  06/05/2024 BSW and pt called [employer] [place] field office together and were unable to speak with an agent. Patient states he got a letter from [facility] requesting him to complete a form but applicant is not sure how to complete. BSW encouraged pt to call [facility] who helped him apply for guidance. 06/13/2024 Pt and BSW called [facility] and spoke with caseworker [name]. She informs Erik Santana she will call SSA re his pending application and his need to update his home address and phone number with them. Pt was encouraged to complete SSA form and mail out asap. Pt intends to do this today.         Recommendation:   attend all scheduled provider appointments call for transportation assistance at least one week before appointments ask for help if you don't understand your health insurance benefits Continue to follow up with [facility] re SSI application.  Follow Up Plan:   Telephone follow up appointment date/time:  07/03/2024 at 3:30pm  [name], BSW [place]/VBCI - [facility] Social Worker [phone]

## 2024-06-18 ENCOUNTER — Encounter: Admitting: Psychology

## 2024-06-25 ENCOUNTER — Telehealth: Payer: Self-pay | Admitting: Diagnostic Neuroimaging

## 2024-06-25 NOTE — Telephone Encounter (Signed)
 Erik Santana: [id number] exp. 06/20/24-08/19/24 Scheduled at [facility] on 07/09/24

## 2024-06-27 ENCOUNTER — Encounter: Admitting: Physical Medicine and Rehabilitation

## 2024-06-27 ENCOUNTER — Encounter: Payer: Self-pay | Admitting: Physical Medicine and Rehabilitation

## 2024-06-27 VITALS — BP 159/78 | HR 70 | Ht 75.0 in | Wt 223.2 lb

## 2024-06-27 DIAGNOSIS — F411 Generalized anxiety disorder: Secondary | ICD-10-CM

## 2024-06-27 DIAGNOSIS — F419 Anxiety disorder, unspecified: Secondary | ICD-10-CM | POA: Diagnosis not present

## 2024-06-27 DIAGNOSIS — R569 Unspecified convulsions: Secondary | ICD-10-CM

## 2024-06-27 DIAGNOSIS — M792 Neuralgia and neuritis, unspecified: Secondary | ICD-10-CM | POA: Diagnosis not present

## 2024-06-27 DIAGNOSIS — R252 Cramp and spasm: Secondary | ICD-10-CM | POA: Diagnosis not present

## 2024-06-27 DIAGNOSIS — F4389 Other reactions to severe stress: Secondary | ICD-10-CM | POA: Diagnosis not present

## 2024-06-27 DIAGNOSIS — G8194 Hemiplegia, unspecified affecting left nondominant side: Secondary | ICD-10-CM | POA: Diagnosis not present

## 2024-06-27 MED ORDER — GABAPENTIN 300 MG PO CAPS: 300.0000 mg | ORAL_CAPSULE | Freq: Every day | ORAL | 2 refills | Status: AC

## 2024-06-27 MED ORDER — CLONAZEPAM 0.5 MG PO TABS: 0.2500 mg | ORAL_TABLET | Freq: Every day | ORAL | 0 refills | Status: DC | PRN

## 2024-06-27 MED ORDER — GABAPENTIN 300 MG PO CAPS: 300.0000 mg | ORAL_CAPSULE | Freq: Every day | ORAL | 2 refills | Status: DC

## 2024-06-27 MED ORDER — ONABOTULINUMTOXINA 100 UNITS IJ SOLR
100.0000 [IU] | Freq: Once | INTRAMUSCULAR | Status: AC
  Administered 2024-06-27: 100 [IU] via INTRAMUSCULAR

## 2024-06-27 MED ORDER — SODIUM CHLORIDE (PF) 0.9 % IJ SOLN
1.0000 mL | Freq: Once | INTRAMUSCULAR | Status: AC
  Administered 2024-06-27: 1 mL

## 2024-06-27 MED ORDER — BUSPIRONE HCL 15 MG PO TABS: 15.0000 mg | ORAL_TABLET | Freq: Three times a day (TID) | ORAL | 2 refills | Status: DC

## 2024-06-27 NOTE — Progress Notes (Unsigned)
 He is walking trails and using his prosthesis less; is noticing some rubbinmg on his foot from intoeing. Getting out of the house more, notes his anxiety will prevent him from leaving unless he takes valium .      Botolulinum Toxin Injection: [x]  BOTOX  (onabotulinumtoxinA ) [_] DYSPORT (abobotulinumtoxinA) [_] Xeomin (Incobotulinum toxin A)   Goals with treatment: [ x] Decrease spasms/ abnormal movements [x ] Improve Active / Passive ROM [ ]  Improve ADLs [x ] Improve functional mobility [x ] Improve gait mechanics [x ] Improve positioning/posture [x ] Prevent contracture  [ ]  Prevent joint destruction [ ]  Prevent skin breakdown [ ]  Decrease caregiver burden [ ]  Improve hygiene [x ] Improve Pain   MEDICATION:  ONAbotulinum toxin. 100 U Units     NaCl 2 mL       CONSENT: Obtained in writing followed by time-out per policy. Consent uploaded to chart.   Benefits discussed included, but were not limited to, decreased muscle tightness and spasticity, increased joint range of motion, improved limb positioning and facilitation of hygiene and nursing care.    Risks discussed included, but were not limited to, pain and discomfort, bleeding, bruising, excessive weakness, venous thrombosis, muscle atrophy, and distant spread of toxin which could include generalized muscle weakness, diplopia, blurred vision, ptosis, dysphagia, dysphonia, dysarthria, urinary incontinence and breathing difficulties. These symptoms have been reported hours to weeks after injection. Swallowing and breathing difficulties may be life threatening, and there have been reports of death. Patient/Family member/Guardian/Caregiver have been offered botulinum toxin informational material upon initial consultation and this information has been continually available. All questions answered to patient/family member/guardian/ caregiver satisfaction. They would like to proceed with procedure. There are no noted contraindications to  procedure.   PROCEDURE [x]  Without Ultrasound: Patient was placed in a position with the appropriate muscles exposed, located and identified. The skin was cleaned with ChloraPrep and ethyl chloride was sprayed for topical anesthetic. Using combination EMG amplification and electrical stimulation, the following muscles were identified using anatomical landmarks described by Perotto, et al (1994) and injected following aspiration to ensure blood vessels were avoided.   [_ ] With Ultrasound: Patient was placed in a position with the appropriate muscles exposed, located and identified. The skin was cleaned with ChloraPrep and ethyl chloride was sprayed for topical anesthetic. Using combination Ultrasound guidance for anatomical guidance, avoidance of significant vasculature, to decrease the risk of hematoma formation and ensure botox  was placed in the correct location; EMG amplification and electrical stimulation, the following muscles were identified and injected following aspiration to ensure blood vessels were avoided. A _linear transducer was used during the procedure. The botulinum toxin was visualized entering the appropriate musculature.    MUSCLE: Units /Sites   100    LLE:                         40 units medial gastrocnemius                         40 units lateral gastrocnemius                         20 units TP      200 units were injected without difficulty. No complications were encountered. The patient tolerated the procedure well. Wasted 0     PLAN: - Resume Usual Activities. Notify Physician of any unusual bleeding, erythema or concern for side effects as  reviewed above. - Apply ice prn for pain - Tylenol  prn for pain - Follow up via MyChart message in 6-8 weeks to assess response to injection   - Discussed anxiety; established with neuropsych, continuiing to look for dedicated psychiatrist to manage medications. Buspar  refilled today.  - Discussed prosthesis adjustment with  [facility] due to rubbing/skin breakdown. Discussed daily monitoring of skin for developing nonblancheable areas

## 2024-06-29 ENCOUNTER — Other Ambulatory Visit: Payer: Self-pay | Admitting: Licensed Clinical Social Worker

## 2024-06-29 NOTE — Patient Instructions (Signed)
 Visit Information  [name] was given information about Medicaid Managed Care team care coordination services as a part of their [facility] Medicaid benefit.   If you would like to schedule transportation through your [facility] plan, please call the following number at least 2 days in advance of your appointment: [phone].   You can also use the MTM portal or MTM mobile app to manage your rides. Reimbursement for transportation is available through [facility]! For the portal, please go to [email or web address]  Call the [facility] Crisis Line at [phone], at any time, 24 hours a day, 7 days a week. If you are in danger or need immediate medical attention call 911.  Please see education materials related to topics discussed provided by MyChart link.  Care plan and visit instructions communicated with the patient verbally today. Patient agrees to receive a copy in MyChart. Active MyChart status and patient understanding of how to access instructions and care plan via MyChart confirmed with patient.     No further follow up required: Pt agreed to inform PCP if any new needs arise  [name], LCSW [facility], [facility] Clinical Social Worker Direct Dial: [phone]  Fax: [phone] Website: [email or web address] 11:44 AM   Following is a copy of your plan of care:   Goals Addressed             This Visit's Progress    COMPLETED: LCSW VBCI Social Work Care Plan   On track    Problems:   Disease Management support and education needs related to Anxiety with Panic Symptoms, and PTSD  CSW Clinical Goal(s):   Over the next 90 days the Patient will attend all scheduled medical appointments as evidenced by patient report and care team review of appointment completion in electronic MEDICAL RECORD NUMBER  demonstrate a reduction in symptoms related to Anxiety with Panic Symptoms, PTSD .  Interventions:  Mental Health:  Evaluation  of current treatment plan related to Anxiety with Panic Symptoms, and PTSD Active listening / Reflection utilized Financial Risk Analyst / information provided Emotional Support Provided Provided general psycho-education for mental health needs Quality of sleep assessed & Sleep Hygiene techniques promoted Reviewed mental health medications and discussed importance of compliance: Pt reports compliance Solution-Focued Strategies employed:Patient continues to participate in counseling and med management Suicidal Ideation/Homicidal Ideation assessed: Pt denies Patient has been cleared to drive, currently assisting relative in [place]  Patient Goals/Self-Care Activities:  Continue taking your medication as prescribed.   Continue utilizing healthy coping skills and healthy habits  Attend scheduled medical appts   Plan:   No further follow up required: Patient agreed to contact PCP if any new needs arise

## 2024-06-29 NOTE — Patient Outreach (Signed)
 Complex Care Management   Visit Note  06/29/2024  Name:  Erik Santana. MRN: [id number] DOB: Jan 03, 1991  Situation: Referral received for Complex Care Management related to Mental/Behavioral Health diagnosis PTSD, Anxiety, and Depression I obtained verbal consent from Patient.  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    Assessment: Patient Reported Symptoms:  Cognitive Cognitive Status: No symptoms reported, Alert and oriented to person, place, and time, Normal speech and language skills Cognitive/Intellectual Conditions Management [RPT]: None reported or documented in medical history or problem list   Health Maintenance Behaviors: Annual physical exam  Neurological Neurological Review of Symptoms: Not assessed    HEENT HEENT Symptoms Reported: Not assessed      Cardiovascular Cardiovascular Symptoms Reported: Not assessed    Respiratory Respiratory Symptoms Reported: Not assesed    Endocrine Endocrine Symptoms Reported: Not assessed    Gastrointestinal Gastrointestinal Symptoms Reported: Not assessed      Genitourinary Genitourinary Symptoms Reported: Not assessed    Integumentary Integumentary Symptoms Reported: Not assessed    Musculoskeletal Musculoskelatal Symptoms Reviewed: Not assessed        Psychosocial Psychosocial Symptoms Reported: No symptoms reported Behavioral Management Strategies: Adequate rest, Coping strategies, Counseling, Medication therapy Behavioral Health Self-Management Outcome: 4 (good) Major Change/Loss/Stressor/Fears (CP): Denies Techniques to Cope with Loss/Stress/Change: Diversional activities, Counseling, Medication Quality of Family Relationships: involved    06/29/2024    PHQ2-9 Depression Screening   Little interest or pleasure in doing  things    Feeling down, depressed, or hopeless    PHQ-2 - Total Score    Trouble falling or staying asleep, or sleeping too much    Feeling tired or having little energy    Poor appetite or overeating     Feeling bad about yourself - or that you are a failure or have let yourself or your family down    Trouble concentrating on things, such as reading the newspaper or watching television    Moving or speaking so slowly that other people could have noticed.  Or the opposite - being so fidgety or restless that you have been moving around a lot more than usual    Thoughts that you would be better off dead, or hurting yourself in some way    PHQ2-9 Total Score    If you checked off any problems, how difficult have these problems made it for you to do your work, take care of things at home, or get along with other people    Depression Interventions/Treatment      There were no vitals filed for this visit.    Medications Reviewed Today     Reviewed by Fernando Torry D, LCSW (Social Worker) on 06/29/24 at 1138  Med List Status: <None>   Medication Order Taking? Sig Documenting Provider Last Dose Status Informant  acetaminophen  (TYLENOL ) 325 MG tablet [id number] Yes Take 1-2 tablets (325-650 mg total) by mouth every 6 (six) hours as needed for mild pain (pain score 1-3), headache or moderate pain (pain score 4-6) (325 mg for pain 1-3, 6500 mg for pain 4-6). Pegge Toribio PARAS, PA-C  Active Self, Pharmacy Records  atorvastatin  (LIPITOR) 40 MG tablet [id number] Yes Take 1 tablet (40 mg total) by mouth daily. Jaycee Greig PARAS, NP  Active   busPIRone  (BUSPAR )  15 MG tablet [id number]  Take 1 tablet (15 mg total) by mouth 3 (three) times daily. Emeline Joesph BROCKS, DO  Active   cetirizine  (ZYRTEC ) 10 MG tablet [id number] Yes TAKE 1 TABLET BY MOUTH EVERY DAY Jaycee Greig PARAS, NP  Active   clonazePAM (KLONOPIN) 0.5 MG tablet [id number]  Take 0.5 tablets (0.25 mg total) by mouth daily as needed for anxiety. Emeline Joesph BROCKS, DO  Active   gabapentin (NEURONTIN) 300 MG capsule [id number]  Take 1 capsule (300 mg total) by mouth at bedtime. Emeline Joesph BROCKS, DO  Active   levETIRAcetam  (KEPPRA ) 500 MG tablet [id number] Yes Take 1 tablet (500 mg total) by mouth 2 (two) times daily. Penumalli, Vikram R, MD  Active   lidocaine  (LIDODERM ) 5 % [id number] Yes Place 2 patches onto the skin daily. Remove & Discard patch within 12 hours or as directed by MD Emeline Joesph BROCKS, DO  Active   loratadine  (CLARITIN ) 10 MG tablet [id number] Yes TAKE 1 TABLET BY MOUTH EVERY DAY  Patient taking differently: Take 10 mg by mouth as needed for allergies.   Jaycee Greig PARAS, NP  Active   meclizine  (ANTIVERT ) 25 MG tablet [id number] Yes Take 1 tablet (25 mg total) by mouth 3 (three) times daily as needed for dizziness. Emeline Joesph BROCKS, DO  Active Self, Pharmacy Records  pantoprazole  (PROTONIX ) 20 MG tablet [id number] Yes Take 1 tablet (20 mg total) by mouth daily. Emeline Joesph BROCKS, DO  Active   traZODone  (DESYREL ) 50 MG tablet [id number] Yes Take 1 tablet (50 mg total) by mouth at bedtime. Emeline Joesph BROCKS, DO  Active             Recommendation:   PCP Follow-up  Follow Up Plan:   Patient has met all care management goals. Care Management case will be closed. Patient has been provided contact information should new needs arise.   Rolin Ezzard HUGHS [facility]  [facility], [facility] Clinical Social Worker Direct Dial: [phone]  Fax: [phone] Website: [email or web address] 11:43 AM

## 2024-07-02 ENCOUNTER — Encounter: Attending: Physical Medicine and Rehabilitation | Admitting: Psychology

## 2024-07-02 DIAGNOSIS — F4389 Other reactions to severe stress: Secondary | ICD-10-CM | POA: Insufficient documentation

## 2024-07-02 DIAGNOSIS — I629 Nontraumatic intracranial hemorrhage, unspecified: Secondary | ICD-10-CM | POA: Insufficient documentation

## 2024-07-02 DIAGNOSIS — F411 Generalized anxiety disorder: Secondary | ICD-10-CM | POA: Insufficient documentation

## 2024-07-03 ENCOUNTER — Other Ambulatory Visit: Payer: Self-pay

## 2024-07-03 NOTE — Patient Instructions (Signed)
 Visit Information  [name] was given information about Medicaid Managed Care team care coordination services as a part of their [facility] Medicaid benefit.   If you would like to schedule transportation through your [facility] plan, please call the following number at least 2 days in advance of your appointment: [phone].   You can also use the MTM portal or MTM mobile app to manage your rides. Reimbursement for transportation is available through [facility]! For the portal, please go to [email or web address]  Call the [facility] Crisis Line at [phone], at any time, 24 hours a day, 7 days a week. If you are in danger or need immediate medical attention call 911.   Care plan and visit instructions communicated with the patient verbally today. Patient agrees to receive a copy in MyChart. Active MyChart status and patient understanding of how to access instructions and care plan via MyChart confirmed with patient.     Telephone follow up appointment with Managed Medicaid care management team member scheduled for: 07/17/2024 at 3:30pm  Erik Santana, BSW [place]/[facility] - [facility] Social Worker [phone]   Following is a copy of your plan of care:   Goals Addressed             This Visit's Progress    BSW goal       Current SDOH Barriers:  Transportation Limited access to food SSI claim Dental need FNS re certification Physical [facility] - [facility]  Interventions: Patient interviewed and appropriate screenings performed Referred patient to community resources  Provided patient with information about out of the garden food project. Advised patient to complete FNS recertification ASAP and turn in to DSS before 11/15 to avoid interruption in FNS benefit past the 11/30. Advised pt he can renew online as well and encouraged him to reach out to caseworker [name] for assistance. Pt is seeking physical therapy through [facility] - on wait  list for several weeks now.  Provided pt with list of dental providers. 05/22/2024 Pt was informed by BSW that he is still [facility]. Patient confirmed he also received a call from them last week.  06/05/2024 BSW and pt called [employer] [place] field office together and were unable to speak with an agent. Patient states he got a letter from [facility] requesting him to complete a form but applicant is not sure how to complete. BSW encouraged pt to call [facility] who helped him apply for guidance. 06/13/2024 Pt and BSW called [facility] and spoke with caseworker [name]. She informs Siedschlag she will call SSA re his pending application and his need to update his home address and phone number with them. Pt was encouraged to complete [employer]SA form and mail out asap. Pt intends to do this today. 07/03/2024 Pt has not re certified for FNS. Pt will send doctor letter to BSW for BSW to e-fax to DSS [place].  BSW and pt attempted to call [employer]SA re his SSI application. No answer. BSW and pt called [name] with [facility] and was told she would contact SSA tomorrow and provide them his new phone number and mailing address. Pt was encouraged to call case worker re any questions he has about his SSI case.

## 2024-07-03 NOTE — Patient Outreach (Signed)
 Social Drivers of Health  Community Resource and Care Coordination Visit Note   07/03/2024  Name: Erik Santana. MRN: [id number] DOB:January 13, 1991  Situation: Referral received for [facility] needs assessment and assistance related to Transportation Financial Strain . I obtained verbal consent from Patient.  Visit completed with Patient on the phone.   Background:   SDOH Interventions Today    Flowsheet Row Most Recent Value  SDOH Interventions   Food Insecurity Interventions Intervention Not Indicated  [patient states he is ok on food.]  Housing Interventions Intervention Not Indicated  Transportation Interventions Patient Resources (Friends/Family)  Erik Santana has a car he sometimes uses or grandfather takes him when needed.]  Utilities Interventions Intervention Not Indicated  Financial Strain Interventions --  [Applicant has a pending SSI application. Pt reached out to [facility] and was encouraged to complete paperwork. Caseworker [name] will be calling SSA to confirm updated pt address and phone number.]     Assessment:   Goals Addressed             This Visit's Progress    BSW goal       Current SDOH Barriers:  Transportation Limited access to food SSI claim Dental need FNS re certification Physical [facility] - [facility]  Interventions: Patient interviewed and appropriate screenings performed Referred patient to community resources  Provided patient with information about out of the garden food project. Advised patient to complete FNS recertification ASAP and turn in to DSS before 11/15 to avoid interruption in FNS benefit past the 11/30. Advised pt he can renew online as well and encouraged him to reach out to caseworker [name] for assistance. Pt is seeking physical therapy through [facility] - on wait list for several weeks now.  Provided pt with list of dental providers. 05/22/2024 Pt was informed by BSW that he is still [facility]. Patient  confirmed he also received a call from them last week.  06/05/2024 BSW and pt called S[employer] [place] field office together and were unable to speak with an agent. Patient states he got a letter from [facility] requesting him to complete a form but applicant is not sure how to complete. BSW encouraged pt to call [facility] who helped him apply for guidance. 06/13/2024 Pt and BSW called [facility] and spoke with caseworker [name]. She informs [place] she will call SSA re his pending application and his need to update his home address and phone number with them. Pt was encouraged to complete SSA form and mail out asap. Pt intends to do this today. 07/03/2024 Pt has not re certified for FNS. Pt will send doctor letter to BSW for BSW to e-fax to DSS [place].  BSW and pt attempted to call [employer] re his SSI application. No answer. BSW and pt called [name] with [facility] and was told she would contact SSA tomorrow and provide them his new phone number and mailing address. Pt was encouraged to call case worker re any questions he has about his SSI case.         Recommendation:   attend all scheduled provider appointments call for transportation assistance at least one week before appointments ask for help if you don't understand your health insurance benefits  Follow Up Plan:   Telephone follow up appointment date/time:  07/17/2024 at 3:30pm  [name], BSW [place]/VBCI - [facility] Social Worker [phone]

## 2024-07-05 NOTE — Therapy (Signed)
 " OUTPATIENT PHYSICAL THERAPY NEURO EVALUATION   Patient Name: Erik Santana. MRN: [id number] DOB:01/03/1991, 34 y.o., male Today's Date: 07/06/2024   PCP: Jaycee Greig PARAS, NP REFERRING PROVIDER: Emeline Joesph BROCKS, DO  END OF SESSION:  PT End of Session - 07/06/24 1018     Visit Number 1    Number of Visits 9   8 visits plus Eval   Date for Recertification  08/17/24    PT Start Time 1017    PT Stop Time 1110    PT Time Calculation (min) 53 min    Equipment Utilized During Treatment Gait belt    Activity Tolerance Patient tolerated treatment well    Behavior During Therapy Restless;Impulsive          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   Past Surgical History:  Procedure Laterality Date   ESOPHAGOGASTRODUODENOSCOPY N/A 10/18/2012   Procedure: ESOPHAGOGASTRODUODENOSCOPY (EGD);  Surgeon: Lamar JONETTA Aho, MD;  Location: [facility];  Service: Endoscopy;  Laterality: N/A;   ESOPHAGOGASTRODUODENOSCOPY (EGD) WITH PROPOFOL  N/A 10/18/2012   Procedure: ESOPHAGOGASTRODUODENOSCOPY (EGD) WITH PROPOFOL ;  Surgeon: Lamar JONETTA Aho, MD;  Location: [facility];  Service: Endoscopy;  Laterality: N/A;   FINGER SURGERY     IR ANGIO INTRA EXTRACRAN SEL COM CAROTID INNOMINATE BILAT MOD SED  08/23/2023   IR ANGIO VERTEBRAL SEL SUBCLAVIAN INNOMINATE UNI L MOD SED  08/23/2023   IR ANGIO VERTEBRAL SEL VERTEBRAL UNI R MOD SED  08/23/2023   OPEN REDUCTION INTERNAL FIXATION (ORIF) PROXIMAL PHALANX Right 10/12/2022   Procedure: Right ring finger middle phalanx and proximal interphalangeal joint closed reduction internal percutaneous pinning;  Surgeon: Alyse Agent, MD;  Location: [facility];  Service: Orthopedics;  Laterality: Right;   Patient Active Problem List   Diagnosis Date Noted   Neuropathic pain 06/27/2024   Gynecomastia, male 05/05/2024    Left-sided chest wall pain 05/05/2024   Testicular discomfort 05/05/2024   Allodynia 05/04/2024   Hemisensory deficit 05/04/2024   Adjustment disorder with mixed anxiety and depressed mood 02/17/2024   Gastroesophageal reflux disease 02/08/2024   Seizure-like activity (HCC) 12/16/2023   PTSD (post-traumatic stress disorder) 12/07/2023   Left hemiparesis (HCC) 12/07/2023   Spasticity 12/07/2023   Emotional lability 10/05/2023   Left foot drop 10/05/2023   Cognitive change 08/30/2023   Intraparenchymal hematoma of brain (HCC) 08/24/2023   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   Anxiety disorder 10/27/2012   Hematemesis 10/18/2012   Other esophagitis 10/18/2012   Mallory-Weiss tear 10/18/2012    ONSET DATE: 05/29/2024 (date of referral)  REFERRING DIAG: I62.9 (ICD-10-CM) - Non-traumatic intracranial hemorrhage (HCC) M21.372 (ICD-10-CM) - Left foot drop  THERAPY DIAG:  Other abnormalities of gait and mobility  Other symptoms and signs involving the nervous system  Rationale for Evaluation and Treatment: Rehabilitation  SUBJECTIVE:  SUBJECTIVE STATEMENT: Pt reports he had a stroke/aneurysm end of last February; went to IP rehab.  Pt reports his L toes don't work; has L drop foot (wearing hinged AFO).  Some balance issues reported but has good coordination so he makes it look good. Pt accompanied by: self (drove)  PERTINENT HISTORY: Pt presented to hospital 08/21/23 with acute onset L sided weakness, dizziness, and HA.  CT of the head showed a 3.9 x 2.4 x 4.1 cm parenchymal hemorrhage centered at the right frontal parietal junction.  Admitted to IP rehab 08/24/23 and discharged 09/22/23.  PT referral for: Evaluate and treat. Aquatherapy for ICH with L tone/hemiparesis; work on increased  resistance, mobilization, endurance improvements and management of tone.  PMH includes ADD, anxiety, asthma, GERD, Mallory-Weiss tear (09/26/2012).  Per recent DO Engler note 06/27/24 pt with L hemiparesis, spasticity, anxiety disorder (panic attacks), neuropathic pain, and seizure like activity (waiting for further workup with neurology).  H/o Botox  for spasticity management.  PAIN:  Are you having pain? No  PRECAUTIONS: Fall; anxiety; L tone.  RED FLAGS: None   WEIGHT BEARING RESTRICTIONS: No  FALLS: Has patient fallen in last 6 months? 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  Independent with ambulation with use of L AFO.  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  Get ankle and toes on L side working.  Be able to run.  OBJECTIVE:  Note: Objective measures were completed at Evaluation unless otherwise noted.  DIAGNOSTIC FINDINGS: CT of head 08/21/23:  3.9 x 2.4 x 4.1 cm parenchymal hemorrhage centered at the right frontoparietal junction. No evidence of intraventricular extension. No mass effect.  COGNITION: Overall cognitive status: A&Ox4; appearing anxious and restless   SENSATION: More sensitive L foot on bottom; numbness reported all toes (light touch intact per pt report)  COORDINATION: Good B heel to shin coordination in sitting  EDEMA:  Mild swelling noted L foot  MUSCLE TONE: LLE: increased tone (10 beats clonus L ankle)  POSTURE: rounded shoulders and forward head  LOWER EXTREMITY ROM:     Passive  Right Eval Left Eval  Hip flexion    Hip extension    Hip abduction    Hip adduction    Hip internal rotation    Hip external rotation    Knee flexion    Knee extension    Ankle dorsiflexion  To neutral  Ankle plantarflexion    Ankle  inversion    Ankle eversion     (Blank rows = not tested)  LOWER EXTREMITY MMT:    MMT Right Eval Left Eval  Hip flexion 5/5 4+/5  Hip extension    Hip abduction    Hip adduction    Hip internal rotation    Hip external rotation    Knee flexion 5/5 4/5  Knee extension 5/5 4+/5  Ankle dorsiflexion 5/5 1/5  Ankle plantarflexion At least 3/5 1-2/5  Ankle inversion    Ankle eversion    (Blank rows = not tested)  BED MOBILITY:  Independent  TRANSFERS: Sit to stand: Complete Independence  Assistive device utilized: None     Stand to sit: Complete Independence  Assistive device utilized: None      STAIRS: Findings: Level of Assistance: SBA, Stair Negotiation Technique: Alternating Pattern  with No Rails, Number of Stairs:  8, Height of Stairs: 6 inch   , and Comments: mild unsteadiness at times; pt needing to take extra time and concentration to perform task; SBA for safety GAIT: Findings: Gait Characteristics: step through gait pattern, Distance walked: clinic distances, Assistive device utilized:None, Level of assistance: Complete Independence, and Comments: use of L AFO  FUNCTIONAL TESTS:  5 times sit to stand: 14.91 seconds no UE support Functional gait assessment: 21/30 FUNCTIONAL GAIT ASSESSMENT  Date: 07/06/24 Score  GAIT LEVEL SURFACE Instructions: Walk at your normal speed from here to the next mark (6 m) [20 ft]. (2) Mild impairment - Walks 6 m (20 ft) in less than 7 seconds but greater than 5.5 seconds, uses assistive device, slower speed, mild gait deviations, or deviates 15.24 -25.4 cm (6 -10 in) outside of the 30.48-cm (12-in) walkway width.  6.78 seconds  2.   CHANGE IN GAIT SPEED Instructions: Begin walking at your normal pace (for 1.5 m [5 ft]). When I tell you go, walk as fast as you can (for 1.5 m [5 ft]). When I tell you slow, walk as slowly as you can (for 1.5 m [5 ft]. (3) Normal - Able to smoothly change walking speed without loss of balance or gait  deviation. Shows a significant difference in walking speeds between normal, fast, and slow speeds. Deviates no more than 15.24 cm (6 in) outside of the 30.48-cm (12-in) walkway width.  3.    GAIT WITH HORIZONTAL HEAD TURNS Instructions: Walk from here to the next mark 6 m (20 ft) away. Begin walking at your normal pace. Keep walking straight; after 3 steps, turn your head to the right and keep walking straight while looking to the right. After 3 more steps, turn your head to the left and keep walking straight while looking left. Continue alternating looking right and left. (3) Normal - Performs head turns smoothly with no change in gait. Deviates no more than 15.24 cm (6 in) outside 30.48-cm (12-in) walkway width.  4.   GAIT WITH VERTICAL HEAD TURNS Instructions: Walk from here to the next mark (6 m [20 ft]). Begin walking at your normal pace. Keep walking straight; after 3 steps, tip your head up and keep walking straight while looking up. After 3 more steps, tip your head down, keep walking straight while looking down. Continue  alternating looking up and down every 3 steps until you have completed 2 repetitions in each direction. (3) Normal - Performs head turns with no change in gait. Deviates no more than 15.24 cm (6 in) outside 30.48-cm (12-in) walkway width.  5.  GAIT AND PIVOT TURN Instructions: Begin with walking at your normal pace. When I tell you, turn and stop, turn as quickly as you can to face the opposite direction and stop. (3) Normal - Pivot turns safely within 3 seconds and stops quickly with no loss of balance  6.   STEP OVER OBSTACLE Instructions: Begin walking at your normal speed. When you come to the shoe box, step over it, not around it, and keep walking. (1) Moderate impairment - Is able to step over one shoe box (11.43 cm [4.5 in] total height) but must slow down and adjust steps to clear box safely. May require verbal cueing.  7.   GAIT WITH NARROW BASE OF  SUPPORT Instructions: Walk on the floor with arms folded across the chest, feet aligned heel to toe in tandem for a distance of 3.6 m [12 ft]. The number of steps taken in a  straight line are counted for a maximum of 10 steps. (1) Moderate impairment - Ambulates 4 -7 steps.  8.   GAIT WITH EYES CLOSED Instructions: Walk at your normal speed from here to the next mark (6 m [20 ft]) with your eyes closed. (1) Moderate impairment - Walks 6 m (20 ft), slow speed, abnormal gait pattern, evidence for imbalance, deviates 25.4 -38.1 cm (10 -15 in) outside 30.48-cm (12-in) walkway width. Requires more than 9 seconds to ambulate 6 m (20 ft).  9.38 seconds  9.   AMBULATING BACKWARDS Instructions: Walk backwards until I tell you to stop (2) Mild impairment - Walks 6 m (20 ft), uses assistive device, slower speed, mild gait deviations, deviates 15.24 -25.4 cm (6 -10 in) outside 30.48-cm (12-in) walkway width.  12.47 seconds  10. STEPS Instructions: Walk up these stairs as you would at home (ie, using the rail if necessary). At the top turn around and walk down. (2) Mild impairment-Alternating feet, must use rail.  Total 21/30   Interpretation of scores: Non-Specific Older Adults Cutoff Score: <=22/30 = risk of falls Parkinsons Disease Cutoff score <15/30= fall risk (Hoehn & Yahr 1-4)  Minimally Clinically Important Difference (MCID)  Stroke (acute, subacute, and chronic) = MDC: 4.2 points Vestibular (acute) = MDC: 6 points Community Dwelling Older Adults =  MCID: 4 points Parkinsons Disease  =  MDC: 4.3 points  (Academy of Neu[employer]ic Physical Therapy (nd). Functional Gait Assessment. Retrieved from https://www.neuropt.org/docs/default-source/cpgs/core-outcome-measures/function-gait-assessment-pocket-guide-proof9-(2).pdf?sfvrsn=b34f35043_0.)**  PATIENT SURVEYS:  TBA                                                                                                                              TREATMENT  DATE: 07/06/24    PATIENT EDUCATION: Education details: Eval results; POC. Person educated: Patient Education method: Explanation and Verbal cues Education comprehension: verbalized understanding and needs further education  HOME EXERCISE PROGRAM: To be issued  GOALS: Goals reviewed with patient? Yes  SHORT TERM GOALS = LONG TERM GOALS (d/t length of POC): Target date: 08/03/2024  Pt will be independent with HEP in order to improve strength and balance in order to decrease fall risk and improve function at home for ADL's and for work. Baseline: To be issued Goal status: INITIAL  2.  Patient will increase Functional Gait Assessment score to 25/30 or greater as to reduce fall risk and improve dynamic gait safety with community ambulation. Baseline: 21/30 Goal status: INITIAL  3.  Patient will ascend/descend 8 stairs without rail assist independently without loss of balance to improve ability to navigate steps in community. Baseline: mild unsteadiness at times; pt needing to take extra time and concentration to perform task; SBA for safety Goal status: INITIAL   ASSESSMENT:  CLINICAL IMPRESSION: Patient is a 34 y.o. male who was seen today for physical therapy evaluation and treatment for h/o non-traumatic intracranial hemorrhage (08/21/23).  Patient presents with L LE weakness (pt using L AFO), impaired ROM (L DF to  neutral), increased L LE tone, and impaired functional mobility (stairs navigation difficulty noted). These impairments are limiting patient from community activities and working.  Evaluation included the following assessment tools: 5 time sit to stand and FGA.  Pt scored 14.91 seconds on the 5 time sit to stand test indicating pt is not at increased risk of falls (>15 seconds = increased risk of falls).  Pt scored 21/30 on FGA indicating pt may be at increased risk of falls (</= 22/30 = risk of falls in elderly).   Patient will benefit from skilled PT to address noted  impairments, improve overall function, and progress towards long term goals.  Pt referred for aquatic therapy and pt requesting aquatic therapy.  Initially planned for land and aquatic therapy but d/t concerns regarding pt being anxious with water, deferred scheduling aquatics at this time (will change land based to aquatic therapy based appts as deemed appropriate).  OBJECTIVE IMPAIRMENTS: Abnormal gait, decreased balance, decreased cognition, decreased knowledge of condition, decreased knowledge of use of DME, decreased mobility, difficulty walking, decreased ROM, decreased strength, decreased safety awareness, impaired sensation, and impaired tone.   ACTIVITY LIMITATIONS: stairs and locomotion level  PARTICIPATION LIMITATIONS: shopping, community activity, and occupation  PERSONAL FACTORS: Behavior pattern, Past/current experiences, Time since onset of injury/illness/exacerbation, and 1-2 comorbidities: PTSD, anxiety, ADD are also affecting patient's functional outcome.   REHAB POTENTIAL: Fair d/t time since intracranial hemorrhage  CLINICAL DECISION MAKING: Stable/uncomplicated  EVALUATION COMPLEXITY: Low  PLAN:  PT FREQUENCY: 2x/week  PT DURATION: 4 weeks  PLANNED INTERVENTIONS: [id number]- PT Re-evaluation, 97750- Physical Performance Testing, 97110-Therapeutic exercises, 97530- Therapeutic activity, [id number]- Neuromuscular re-education, 97535- Self Care, [id number]- Manual therapy, [id number]- Gait training, [phone]- Orthotic Initial, [phone]- Orthotic/Prosthetic subsequent, [phone]- Aquatic Therapy, [phone]- Electrical stimulation (manual), Patient/Family education, Balance training, Stair training, Taping, Joint mobilization, Vestibular training, DME instructions, Cryotherapy, Moist heat, and Biofeedback  PLAN FOR NEXT SESSION: Balance; LE strength; endurance.  Consider switching some land based therapy to aquatic based therapy sessions (none scheduled yet d/t pt appearing anxious with water).   Damien Caulk, PT 07/06/2024, 3:59 PM         "

## 2024-07-06 ENCOUNTER — Ambulatory Visit: Attending: Physical Medicine and Rehabilitation | Admitting: Physical Therapy

## 2024-07-06 ENCOUNTER — Encounter: Admitting: Psychology

## 2024-07-06 ENCOUNTER — Other Ambulatory Visit: Payer: Self-pay

## 2024-07-06 ENCOUNTER — Encounter: Payer: Self-pay | Admitting: Physical Therapy

## 2024-07-06 VITALS — BP 133/86 | HR 81

## 2024-07-06 DIAGNOSIS — F411 Generalized anxiety disorder: Secondary | ICD-10-CM | POA: Diagnosis not present

## 2024-07-06 DIAGNOSIS — R2681 Unsteadiness on feet: Secondary | ICD-10-CM | POA: Diagnosis present

## 2024-07-06 DIAGNOSIS — F4389 Other reactions to severe stress: Secondary | ICD-10-CM | POA: Diagnosis not present

## 2024-07-06 DIAGNOSIS — R2689 Other abnormalities of gait and mobility: Secondary | ICD-10-CM | POA: Insufficient documentation

## 2024-07-06 DIAGNOSIS — R29818 Other symptoms and signs involving the nervous system: Secondary | ICD-10-CM | POA: Insufficient documentation

## 2024-07-06 DIAGNOSIS — I629 Nontraumatic intracranial hemorrhage, unspecified: Secondary | ICD-10-CM

## 2024-07-06 DIAGNOSIS — M6281 Muscle weakness (generalized): Secondary | ICD-10-CM | POA: Diagnosis present

## 2024-07-06 DIAGNOSIS — R278 Other lack of coordination: Secondary | ICD-10-CM | POA: Insufficient documentation

## 2024-07-06 NOTE — Progress Notes (Unsigned)
 "  Neuropsychology / Psychotherapy Note  [place]  Physical Medicine and Rehabilitation    Patient: Erik Santana  DOB: [birth year]  MRN: [id number]  Provider: Evalene DOROTHA Riff, PsyD Modality & Individuals Present: The patient was seen in-person, unaccompanied, by the provider in the PM&R outpatient clinic office.  Location: [facility] Physical Medicine & Rehabilitation [address] [place] [address]  Date of Service: 06/08/2024 Duration of Service:  60 min for in-person, face to face, psychotherapy for adjustment to injury and psychiatric symptoms due to ABI within the context of premorbid anxiety/panic, depression, and history of trauma.  Record Review & Presenting Concerns (Per 02/23/24 intake):The patient is a 34 year old male referred for psychotherapy.  The patient has a past medical history, per records, of PTSD, ADHD, GAD. Hospital medical records (08/24/23) indicated the patient presented with left sided weakness, NIH of 7 on admission, and subsequent CT of the head showed  CT head showed ICH in right fronto parietal region likely to be embolic in nature. MRI showed unchanged appearance of intraparenchymal hematoma within the superior right frontal lobe. No mass lesion. Echo showed LVEF 55-60%, Patient underwent cerebral angiogram which showed no gross abnormalities identified in terms of aneurysms, dural AV fistula, A/V shunting, dissections, or  intraluminal filling defects.Dural venous sinuses grossly patent. Neurology recommended no antithrombotic due to acute ICH. Patient cleared for discharge to acute inpatient rehab.Neurology signed off. 09/22/2023 PM&R HPI indicated ... 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  Presented 08/21/2023 with acute onset of left-sided weakness as well as dizziness and headache.  He was able to call EMS himself.  EMS found him on the floor... CT of the head  showed a 3.9 x 2.4 x 4.1 cm parenchymal hemorrhage centered at the right frontal parietal junction.  No evidence of intraventricular extension or mass effect.  CT cervical spine negative.  CTA showed no intracranial large vessel occlusion or significant stenosis...MRI follow-up showed unchanged appearance of intraparenchymal hematoma within the superior right frontal lobe.  No mass lesion.  Arteriogram completed showing no gross abnormalities... [name] added for tobacco use.  Blood pressure controlled he has not required Cleviprex .  Therapy evaluations completed due to patient decreased functional mobility was admitted for a comprehensive rehab program. The patient met with Psychiatry with [facility] to establish care on 01/04/2024. Visit notes indicate the patient had been on Lexapro  but discontinued due to side effects. He had been off medications excluding muscle relaxer and buspirone  for about one month. Difficulties with panic/anxiety when in public settings became prominent. The patient reached out to PM&R for assistance with medications following that.  Visit notes from his PM&R visit on 02/08/2024 indicated he was started on trazodone  to help with sleep.  Prazosin  was discontinued due to dreams.  Valium  was resumed.  In notes indicated the physiatrist is reaching out to disability specialist for paperwork needs for his application.  Visit notes indicated spasticity and GERD were also present.   The patient presents with premorbid ADHD, anxiety, and history of trauma. 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 Difficulties with anxiety are prominent, and there is some concern for PTSD necessitating closer assessment. The patient demonstrates excellent insight into his difficulties and descriptions suggest  strong ability for perspective taking, but linked  to complex and challenging environment growing up. The patient is likely to benefit from individual therapy integrating mindfulness/relaxation strategies, along with EF cognitive rehab strategies if needed. Trauma treatment may be beneficial, and emotional suppression/avoidance may be factors exacerbating symptoms and warrant attention as well. Current session focused on obtaining background information and diagnostic information as well as establishing rapport and clarifying patient goals for treatment. Emotion regulation is primary focus, particularly with management of anxiety (cognitive and physiological Sx). Adjustment to injury work and more concrete goal setting (finding purpose/direction). Increased activity levels. 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  Adjustment to injury & Cognitive Establish health relationships (social/romantic), reduce engagement in prior interpersonal dynamic tendencies, live in accordance with personal beliefs/values.   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 Identify areas of cognitive difficulty, learn and utilize compensatory strategies. Anxiety/Panic Learn and utilize CBT restructuring techniques Learn and utilize relaxation/mindfulness strategies Reduce avoidance behaviors ADHD? - Evaluate - Coordinate with Emeline or psychiatry if needed.  Other trauma- and stressor related disorder (PTSD? PD?)  Session Content: Goals focused on this session: Psychoeducation, emotion regulation, interpersonal skills/social communication,   Themes/Topics Discussed: -  Pt feels that he is progressing with emotion regulation. Described indications of reduced emotional avoidance.  - Pt to attend sister wedding. Processed thoughts and feelings, including worries about interactions during this.  - Discussed in-depth physiological components of panic attack, connecting with current symptoms patient experiences.  - Pt does have some social interactions, but primarily within context of online/gaming.  - Reinforced previous work on cognitive distortions/biases, validated as potentially adaptive in the past but not necessarily effective in the present.  - Motivational interviewing to reduce emotional avoidance.  - Pt interest in revisiting ADHD assessment Interventions and Techniques Used: Psychoeducation, CBT, Motivational interviewing.  Behavioral/Assessment  Alertness/Attention: Alert. Attentive Orientation: Intact Language: Speech prosodic, fluent, well articulated. Receptive language intact.  Motor/Sensory: Ambulated independently and without issue.  Mood/Affect: Irritability to euthymic, congruent with patient perspective/understandable. Mood and Affect congruent. Mood/affect returned to baseline prior to session.  Interaction: Cooperative, engaged. Genuine.  Progress: Pt responded well to psychoeducation.  Barriers: Difficulties with attention/concentration.SABRA [place]ings[place] interpret all physio as pathology/physical health concern. Suicide/Homicide/Aggression Risk: No SI or HI.   Session Assignment: - Meet next week for ADHD interview/evaluation   Revisit  - Do breathing exercise 1x/day (ongoing) - Read anx/panic psychoed handouts. (Ongoing) - CBT thought-feeling connection exercise/worsksheet (ongoing) - Conversation with child-self exercise - Bring planner - Continue to engage in activities outside the home   -nightmares.  -presences.  Nightmares a lot,  Got worse for  amin when stopped taking medicine.  Find faith more, lookin for faith more...  Tortue  dreams... both tortue me and othres...  Nightmares - year before is when they are kicking... hard wana sleep getting tortured...  Gabapentin . Helping...  Walking... finger tips were tingly and numb Feel more looser... less tense...  Less sensation of the tension...   Come and go ...  Set me off... when keppra ....  When start getting negative... that's when chaotic...  Gotten better finding it better to control....       Evalene DOROTHA Riff, PsyD Clinical Neuropsychology [address] [place], [place] [address] Main: [phone]  Fax: [phone] [place]: [phone]   Consent and Confidentiality (reviewed 02/23/24): The nature, purpose, anticipated course, modalities, risks and benefits, limitations, alternative treatment options, of psychotherapy were reviewed with the patient. The patient was informed of the expected benefits, potential risks, and limitations of treatment, as well as alternative treatment options. The patient was given the opportunity to ask questions. The patient understands that participation in psychotherapy is voluntary and may be discontinued at any time. Confidentiality of all information shared during psychotherapy is maintained in accordance with federal and state laws, including the [employer] and Accountability Act (HIPAA). Psychotherapy notes are not disclosed without specific patient authorization, except as required by law (e.g., risk of harm to self or others, court order, or mandatory reporting). The patient has been informed of the limits of confidentiality and the circumstances under which information may be disclosed. The patient acknowledged understanding of the above information and consents to participate in psychotherapy under these terms.  This report was generated using voice recognition software. While this document has been carefully reviewed, transcription errors may be present. I apologize in advance for any inconvenience.  Please contact me if further clarification is needed.  "

## 2024-07-09 ENCOUNTER — Ambulatory Visit
Admission: RE | Admit: 2024-07-09 | Discharge: 2024-07-09 | Disposition: A | Source: Ambulatory Visit | Attending: Diagnostic Neuroimaging | Admitting: Diagnostic Neuroimaging

## 2024-07-09 DIAGNOSIS — I610 Nontraumatic intracerebral hemorrhage in hemisphere, subcortical: Secondary | ICD-10-CM | POA: Diagnosis not present

## 2024-07-09 DIAGNOSIS — G40109 Localization-related (focal) (partial) symptomatic epilepsy and epileptic syndromes with simple partial seizures, not intractable, without status epilepticus: Secondary | ICD-10-CM

## 2024-07-09 MED ORDER — GADOPICLENOL 0.5 MMOL/ML IV SOLN
10.0000 mL | Freq: Once | INTRAVENOUS | Status: AC | PRN
  Administered 2024-07-09: 10 mL via INTRAVENOUS

## 2024-07-12 ENCOUNTER — Ambulatory Visit: Payer: Self-pay | Admitting: Diagnostic Neuroimaging

## 2024-07-13 ENCOUNTER — Encounter: Payer: Self-pay | Admitting: Physical Medicine and Rehabilitation

## 2024-07-13 ENCOUNTER — Ambulatory Visit: Admitting: Family

## 2024-07-17 ENCOUNTER — Other Ambulatory Visit: Payer: Self-pay

## 2024-07-18 ENCOUNTER — Ambulatory Visit: Admitting: Physical Therapy

## 2024-07-18 ENCOUNTER — Encounter: Payer: Self-pay | Admitting: Physical Therapy

## 2024-07-18 VITALS — BP 137/81 | HR 83

## 2024-07-18 DIAGNOSIS — R29818 Other symptoms and signs involving the nervous system: Secondary | ICD-10-CM

## 2024-07-18 DIAGNOSIS — R278 Other lack of coordination: Secondary | ICD-10-CM

## 2024-07-18 DIAGNOSIS — M6281 Muscle weakness (generalized): Secondary | ICD-10-CM

## 2024-07-18 DIAGNOSIS — R2689 Other abnormalities of gait and mobility: Secondary | ICD-10-CM

## 2024-07-18 NOTE — Therapy (Signed)
 " OUTPATIENT PHYSICAL THERAPY NEURO TREATMENT   Patient Name: Erik Santana. MRN: [id number] DOB:07-09-1990, 34 y.o., male Today's Date: 07/18/2024   PCP: Jaycee Greig PARAS, NP REFERRING PROVIDER: Emeline Joesph BROCKS, DO  END OF SESSION:  PT End of Session - 07/18/24 1537     Visit Number 2    Number of Visits 9   8 visits plus Eval   Date for Recertification  08/17/24    Authorization Type [facility] Medicaid    Authorization Time Period 8 visits (07/18/24-08/24/24)    Authorization - Visit Number 1    Authorization - Number of Visits 8    PT Start Time 1535    PT Stop Time 1626    PT Time Calculation (min) 51 min    Equipment Utilized During Treatment Gait belt    Activity Tolerance Patient tolerated treatment well    Behavior During Therapy Impulsive          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   Past Surgical History:  Procedure Laterality Date   ESOPHAGOGASTRODUODENOSCOPY N/A 10/18/2012   Procedure: ESOPHAGOGASTRODUODENOSCOPY (EGD);  Surgeon: Lamar JONETTA Aho, MD;  Location: [facility];  Service: Endoscopy;  Laterality: N/A;   ESOPHAGOGASTRODUODENOSCOPY (EGD) WITH PROPOFOL  N/A 10/18/2012   Procedure: ESOPHAGOGASTRODUODENOSCOPY (EGD) WITH PROPOFOL ;  Surgeon: Lamar JONETTA Aho, MD;  Location: [facility];  Service: Endoscopy;  Laterality: N/A;   FINGER SURGERY     IR ANGIO INTRA EXTRACRAN SEL COM CAROTID INNOMINATE BILAT MOD SED  08/23/2023   IR ANGIO VERTEBRAL SEL SUBCLAVIAN INNOMINATE UNI L MOD SED  08/23/2023   IR ANGIO VERTEBRAL SEL VERTEBRAL UNI R MOD SED  08/23/2023   OPEN REDUCTION INTERNAL FIXATION (ORIF) PROXIMAL PHALANX Right 10/12/2022   Procedure: Right ring finger middle phalanx and proximal interphalangeal joint closed reduction internal percutaneous pinning;  Surgeon: Alyse Agent, MD;  Location: [facility];   Service: Orthopedics;  Laterality: Right;   Patient Active Problem List   Diagnosis Date Noted   Neuropathic pain 06/27/2024   Gynecomastia, male 05/05/2024   Left-sided chest wall pain 05/05/2024   Testicular discomfort 05/05/2024   Allodynia 05/04/2024   Hemisensory deficit 05/04/2024   Adjustment disorder with mixed anxiety and depressed mood 02/17/2024   Gastroesophageal reflux disease 02/08/2024   Seizure-like activity (HCC) 12/16/2023   PTSD (post-traumatic stress disorder) 12/07/2023   Left hemiparesis (HCC) 12/07/2023   Spasticity 12/07/2023   Emotional lability 10/05/2023   Left foot drop 10/05/2023   Cognitive change 08/30/2023   Intraparenchymal hematoma of brain (HCC) 08/24/2023   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   Anxiety disorder 10/27/2012   Hematemesis 10/18/2012   Other esophagitis 10/18/2012   Mallory-Weiss tear 10/18/2012    ONSET DATE: 05/29/2024 (date of referral)  REFERRING DIAG: I62.9 (ICD-10-CM) - Non-traumatic intracranial hemorrhage (HCC) M21.372 (ICD-10-CM) - Left foot drop  THERAPY DIAG:  Other abnormalities of gait and mobility  Other symptoms and signs involving the nervous system  Muscle weakness (generalized)  Other lack of coordination  Rationale for Evaluation and Treatment: Rehabilitation  SUBJECTIVE:  SUBJECTIVE STATEMENT: Pt reports no acute changes; no concerns.  Reports having tunnel vision and leg shaking when he has a seizure (has an aura); last seizure was when he was taken off medication (had one in OP PT at this clinic and one after that last year but none recently); can't remember when last had seizure.  No recent falls.  Pt arrived (carrying L AFO) with Strawberry Kiwi flavored drink (64 fluid oz) and clear plastic container of  peanuts (pt snacking beginning of session); pt reporting feeling hungry. Pt accompanied by: self (drove)  PERTINENT HISTORY: Pt presented to hospital 08/21/23 with acute onset L sided weakness, dizziness, and HA.  CT of the head showed a 3.9 x 2.4 x 4.1 cm parenchymal hemorrhage centered at the right frontal parietal junction.  Admitted to IP rehab 08/24/23 and discharged 09/22/23.  PT referral for: Evaluate and treat. Aquatherapy for ICH with L tone/hemiparesis; work on increased resistance, mobilization, endurance improvements and management of tone.  PMH includes ADD, anxiety, asthma, GERD, Mallory-Weiss tear (09/26/2012).  Per recent DO Engler note 06/27/24 pt with L hemiparesis, spasticity, anxiety disorder (panic attacks), neuropathic pain, and seizure like activity (waiting for further workup with neurology).  H/o Botox  for spasticity management.  PAIN:  Are you having pain? No  PRECAUTIONS: Fall; anxiety; L tone.  RED FLAGS: None   WEIGHT BEARING RESTRICTIONS: No  FALLS: Has patient fallen in last 6 months? 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  Independent with ambulation with use of L AFO.  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  Get ankle and toes on L side working.  Be able to run.  OBJECTIVE:  Note: Objective measures were completed at Evaluation unless otherwise noted.  DIAGNOSTIC FINDINGS: CT of head 08/21/23:  3.9 x 2.4 x 4.1 cm parenchymal hemorrhage centered at the right frontoparietal junction. No evidence of intraventricular extension. No mass effect.  COGNITION: Overall cognitive status: A&Ox4; appearing anxious and restless   SENSATION: More sensitive L foot on bottom; numbness reported all toes (light touch intact per pt  report)  COORDINATION: Good B heel to shin coordination in sitting  EDEMA:  Mild swelling noted L foot  MUSCLE TONE: LLE: increased tone (10 beats clonus L ankle)  POSTURE: rounded shoulders and forward head  LOWER EXTREMITY ROM:     Passive  Right Eval Left Eval  Hip flexion    Hip extension    Hip abduction    Hip adduction    Hip internal rotation    Hip external rotation    Knee flexion    Knee extension    Ankle dorsiflexion  To neutral  Ankle plantarflexion    Ankle inversion    Ankle eversion     (Blank rows = not tested)  LOWER EXTREMITY MMT:    MMT Right Eval Left Eval  Hip flexion 5/5 4+/5  Hip extension    Hip abduction    Hip adduction    Hip internal rotation    Hip external rotation    Knee flexion 5/5 4/5  Knee extension 5/5 4+/5  Ankle dorsiflexion 5/5 1/5  Ankle plantarflexion At least 3/5 1-2/5  Ankle inversion    Ankle eversion    (Blank rows = not tested)  BED MOBILITY:  Independent  TRANSFERS: Sit to stand:  Complete Independence  Assistive device utilized: None     Stand to sit: Complete Independence  Assistive device utilized: None      STAIRS: Findings: Level of Assistance: SBA, Stair Negotiation Technique: Alternating Pattern  with No Rails, Number of Stairs: 8, Height of Stairs: 6 inch   , and Comments: mild unsteadiness at times; pt needing to take extra time and concentration to perform task; SBA for safety GAIT: Findings: Gait Characteristics: step through gait pattern, Distance walked: clinic distances, Assistive device utilized:None, Level of assistance: Complete Independence, and Comments: use of L AFO  FUNCTIONAL TESTS:  5 times sit to stand: 14.91 seconds no UE support Functional gait assessment: 21/30 FUNCTIONAL GAIT ASSESSMENT  Date: 07/06/24 Score  GAIT LEVEL SURFACE Instructions: Walk at your normal speed from here to the next mark (6 m) [20 ft]. (2) Mild impairment - Walks 6 m (20 ft) in less than 7 seconds but  greater than 5.5 seconds, uses assistive device, slower speed, mild gait deviations, or deviates 15.24 -25.4 cm (6 -10 in) outside of the 30.48-cm (12-in) walkway width.  6.78 seconds  2.   CHANGE IN GAIT SPEED Instructions: Begin walking at your normal pace (for 1.5 m [5 ft]). When I tell you go, walk as fast as you can (for 1.5 m [5 ft]). When I tell you slow, walk as slowly as you can (for 1.5 m [5 ft]. (3) Normal - Able to smoothly change walking speed without loss of balance or gait deviation. Shows a significant difference in walking speeds between normal, fast, and slow speeds. Deviates no more than 15.24 cm (6 in) outside of the 30.48-cm (12-in) walkway width.  3.    GAIT WITH HORIZONTAL HEAD TURNS Instructions: Walk from here to the next mark 6 m (20 ft) away. Begin walking at your normal pace. Keep walking straight; after 3 steps, turn your head to the right and keep walking straight while looking to the right. After 3 more steps, turn your head to the left and keep walking straight while looking left. Continue alternating looking right and left. (3) Normal - Performs head turns smoothly with no change in gait. Deviates no more than 15.24 cm (6 in) outside 30.48-cm (12-in) walkway width.  4.   GAIT WITH VERTICAL HEAD TURNS Instructions: Walk from here to the next mark (6 m [20 ft]). Begin walking at your normal pace. Keep walking straight; after 3 steps, tip your head up and keep walking straight while looking up. After 3 more steps, tip your head down, keep walking straight while looking down. Continue  alternating looking up and down every 3 steps until you have completed 2 repetitions in each direction. (3) Normal - Performs head turns with no change in gait. Deviates no more than 15.24 cm (6 in) outside 30.48-cm (12-in) walkway width.  5.  GAIT AND PIVOT TURN Instructions: Begin with walking at your normal pace. When I tell you, turn and stop, turn as quickly as you can to face the  opposite direction and stop. (3) Normal - Pivot turns safely within 3 seconds and stops quickly with no loss of balance  6.   STEP OVER OBSTACLE Instructions: Begin walking at your normal speed. When you come to the shoe box, step over it, not around it, and keep walking. (1) Moderate impairment - Is able to step over one shoe box (11.43 cm [4.5 in] total height) but must slow down and adjust steps to clear box safely. May require verbal  cueing.  7.   GAIT WITH NARROW BASE OF SUPPORT Instructions: Walk on the floor with arms folded across the chest, feet aligned heel to toe in tandem for a distance of 3.6 m [12 ft]. The number of steps taken in a straight line are counted for a maximum of 10 steps. (1) Moderate impairment - Ambulates 4 -7 steps.  8.   GAIT WITH EYES CLOSED Instructions: Walk at your normal speed from here to the next mark (6 m [20 ft]) with your eyes closed. (1) Moderate impairment - Walks 6 m (20 ft), slow speed, abnormal gait pattern, evidence for imbalance, deviates 25.4 -38.1 cm (10 -15 in) outside 30.48-cm (12-in) walkway width. Requires more than 9 seconds to ambulate 6 m (20 ft).  9.38 seconds  9.   AMBULATING BACKWARDS Instructions: Walk backwards until I tell you to stop (2) Mild impairment - Walks 6 m (20 ft), uses assistive device, slower speed, mild gait deviations, deviates 15.24 -25.4 cm (6 -10 in) outside 30.48-cm (12-in) walkway width.  12.47 seconds  10. STEPS Instructions: Walk up these stairs as you would at home (ie, using the rail if necessary). At the top turn around and walk down. (2) Mild impairment-Alternating feet, must use rail.  Total 21/30   Interpretation of scores: Non-Specific Older Adults Cutoff Score: <=22/30 = risk of falls Parkinsons Disease Cutoff score <15/30= fall risk (Hoehn & Yahr 1-4)  Minimally Clinically Important Difference (MCID)  Stroke (acute, subacute, and chronic) = MDC: 4.2 points Vestibular (acute) = MDC: 6 points Community  Dwelling Older Adults =  MCID: 4 points Parkinsons Disease  =  MDC: 4.3 points  (Academy of Neurologic Physical Therapy (nd). Functional Gait Assessment. Retrieved from https://www.neuropt.org/docs/default-source/cpgs/core-outcome-measures/function-gait-assessment-pocket-guide-proof9-(2).pdf?sfvrsn=b39f35043_0.)**  PATIENT SURVEYS:  TBA                                                                                                                              TREATMENT DATE: 07/18/24  Self Care: BP and HR taken in sitting at rest beginning of session (see below for details). Vitals:   07/18/24 1541  BP: 137/81  Pulse: 83   Therapeutic Exercise (for HEP; pt requesting L ankle/foot strengthening ex's): Unable to perform toe spreading L LE in standing with cueing so deferred exercise Seated Toe Curl  x5 reps L LE (towel under L foot for feedback) Seated L Ankle Dorsiflexion AROM x5 reps (initially tried with AAROM but pt tending to pull too much with towel and foot not in neutral alignment so switched to focusing on activation of muscle (d/t significant weakness) instead of AAROM) Seated L Heel Raise x5 reps (d/t significant weakness focused on activation of muscle instead of AAROM) Standing Hip Extension with Counter Support x10 reps B LE's Mini Squat with Counter Support x10 reps (cues for equal WB'ing B LE's and more equal glute squeeze when standing up) Standing Hip Abduction with Counter Support x10 reps B LE's (vc's for technique and  not lean to side during exercise) Side Step Overs with Cones and Counter Support x6 reps (stepping over (3) 6 inch cones each rep)    PATIENT EDUCATION: Education details: Issued HEP (focus on quality of exercises). Person educated: Patient Education method: Explanation and Verbal cues Education comprehension: verbalized understanding and needs further education  HOME EXERCISE PROGRAM: Access Code: [email or web address] URL:  [email or web address] Date: 07/18/2024 Prepared by: Damien Caulk  Exercises - Seated Toe Curl  - 1 x daily - 3-5 x weekly - 1-2 sets - 5-10 reps - Seated Ankle Dorsiflexion AROM  - 1 x daily - 3-5 x weekly - 1-2 sets - 5-10 reps - Seated Heel Raise  - 1 x daily - 3-5 x weekly - 1-2 sets - 5-10 reps - Standing Hip Extension with Counter Support  - 1 x daily - 3-5 x weekly - 1-2 sets - 10 reps - Mini Squat with Counter Support  - 1 x daily - 3-5 x weekly - 1-2 sets - 10 reps - Standing Hip Abduction with Counter Support  - 1 x daily - 3-5 x weekly - 1-2 sets - 10 reps - Side Step Overs with Cones and Counter Support  - 1 x daily - 3-5 x weekly - 1-2 sets - 10 reps  GOALS: Goals reviewed with patient? Yes  SHORT TERM GOALS = LONG TERM GOALS (d/t length of POC): Target date: 08/03/2024  Pt will be independent with HEP in order to improve strength and balance in order to decrease fall risk and improve function at home for ADL's and for work. Baseline: To be issued Goal status: INITIAL  2.  Patient will increase Functional Gait Assessment score to 25/30 or greater as to reduce fall risk and improve dynamic gait safety with community ambulation. Baseline: 21/30 Goal status: INITIAL  3.  Patient will ascend/descend 8 stairs without rail assist independently without loss of balance to improve ability to navigate steps in community. Baseline: mild unsteadiness at times; pt needing to take extra time and concentration to perform task; SBA for safety Goal status: INITIAL   ASSESSMENT:  CLINICAL IMPRESSION: Patient was seen today for physical therapy treatment to address HEP.  Focused session on issuing HEP and cues to focus on quality/technique of exercises (instead of quantity).  Patient responded well with intermittent redirection as needed during session.  Pt demonstrates and verbalizes appropriate understanding of HEP and pt issued HEP handout (pt educated on online videos  for HEP to use as needed).  Pt did not ask about aquatic therapy during session (initially deferred scheduling aquatics d/t concerns regarding pt being anxious with water) but will change land based to aquatic therapy based appts as deemed appropriate.  They would continue to benefit from skilled PT to address impairments as noted and progress towards long term goals.  OBJECTIVE IMPAIRMENTS: Abnormal gait, decreased balance, decreased cognition, decreased knowledge of condition, decreased knowledge of use of DME, decreased mobility, difficulty walking, decreased ROM, decreased strength, decreased safety awareness, impaired sensation, and impaired tone.   ACTIVITY LIMITATIONS: stairs and locomotion level  PARTICIPATION LIMITATIONS: shopping, community activity, and occupation  PERSONAL FACTORS: Behavior pattern, Past/current experiences, Time since onset of injury/illness/exacerbation, and 1-2 comorbidities: PTSD, anxiety, ADD are also affecting patient's functional outcome.   REHAB POTENTIAL: Fair d/t time since intracranial hemorrhage  CLINICAL DECISION MAKING: Stable/uncomplicated  EVALUATION COMPLEXITY: Low  PLAN:  PT FREQUENCY: 2x/week  PT DURATION: 4 weeks  PLANNED INTERVENTIONS: 97164- PT Re-evaluation, 97750- Physical Performance Testing, 97110-Therapeutic exercises,  02469- Therapeutic activity, [id number]- Neuromuscular re-education, [id number]- Self Care, [id number]- Manual therapy, [id number]- Gait training, [phone]- Orthotic Initial, [id number]- Orthotic/Prosthetic subsequent, [id number]- Aquatic Therapy, [phone]- Electrical stimulation (manual), Patient/Family education, Balance training, Stair training, Taping, Joint mobilization, Vestibular training, DME instructions, Cryotherapy, Moist heat, and Biofeedback  PLAN FOR NEXT SESSION: Check on HEP.  Balance; LE strength; endurance.  Consider switching some land based therapy to aquatic based therapy sessions (none scheduled yet d/t pt appearing anxious with  water).   Damien Caulk, PT 07/18/2024, 5:01 PM         "

## 2024-07-18 NOTE — Patient Outreach (Signed)
 Social Drivers of Health  Community Resource and Care Coordination Visit Note   07/18/2024  Name: Erik Santana. MRN: [id number] DOB:[birth year]  Situation: Referral received for [facility] needs assessment and assistance related to [employer] . I obtained verbal consent from Patient.  Visit completed with Patient on the phone.   Background:   SDOH Interventions Today    Flowsheet Row Most Recent Value  SDOH Interventions   Food Insecurity Interventions Intervention Not Indicated  Housing Interventions Other (Comment)  [patient currently lives with grandfather. Pt does not have familial support with housing. States living with mother is too toxic. Dad is not really supportive.]  Transportation Interventions Patient Resources (Friends/Family)  [has grandfathers fleeta to travel around, but lacks money for gas. Pt has been infomred of medicaid transportation in the past.]  Utilities Interventions Intervention Not Indicated  Financial Strain Interventions Other (Comment)  [pending SSI application. Has been in communication with [name] at [facility]. Pt has been instructed to reach out to them if he has questions.]     Assessment:   Goals Addressed             This Visit's Progress    BSW goal       Current SDOH Barriers:  Transportation Limited access to food SSI claim Dental need FNS re certification Physical therapy - [facility]  Interventions: Patient interviewed and appropriate screenings performed Referred patient to community resources  Provided patient with information about out of the garden food project. Advised patient to complete FNS recertification ASAP and turn in to DSS before 11/15 to avoid interruption in FNS benefit past the 11/30. Advised pt he can renew online as well and encouraged him to reach out to caseworker Erik Santana for assistance. Pt is seeking physical therapy through [facility] - on wait list for several weeks now.  Provided  pt with list of dental providers. 05/22/2024 Pt was informed by BSW that he is still [facility]. Patient confirmed he also received a call from them last week.  06/05/2024 BSW and pt called [employer] [name] field office together and were unable to speak with an agent. Patient states he got a letter from [facility] requesting him to complete a form but applicant is not sure how to complete. BSW encouraged pt to call [facility] who helped him apply for guidance. 06/13/2024 Pt and BSW called [facility] and spoke with caseworker [name]. She informs [place] she will call [employer] re his pending application and his need to update his home address and phone number with them. Pt was encouraged to complete [employer] form and mail out asap. Pt intends to do this today. 07/03/2024 Pt has not re certified for FNS. Pt will send doctor letter to BSW for BSW to e-fax to DSS [place].  BSW and pt attempted to call [employer] re his SSI application. No answer. BSW and pt called [name] with [facility] and was told she would contact [employer] tomorrow and provide them his new phone number and mailing address. Pt was encouraged to call case worker re any questions he has about his SSI case. 07/18/2024 BSW received VM  from social worker with [employer] re FNS. BSW returned call and no answer. BSW left a VM requesting case manager reach out to pt about providing documentation for his FNS recertification and doctor letter. Case manager: Erik Santana [phone]).  BSW will continue outreach to [facility] for medicaid case worker information.  Recommendation:   attend all scheduled provider appointments call for transportation assistance at least one week before appointments ask for help if you don't understand your health insurance benefits F/u with DSS worker Erik Santana [phone]  Follow Up Plan:   Telephone follow up appointment date/time:  07/25/2024 at 1pm.  Erik Santana, BSW [place]/[facility] - [facility] Social Worker [phone]

## 2024-07-18 NOTE — Patient Instructions (Signed)
 Visit Information  Mr. Kiss was given information about Medicaid Managed Care team care coordination services as a part of their [facility] Medicaid benefit.   If you would like to schedule transportation through your [facility] plan, please call the following number at least 2 days in advance of your appointment: [phone].   You can also use the MTM portal or MTM mobile app to manage your rides. Reimbursement for transportation is available through [facility]! For the portal, please go to [email or web address]  Call the [facility] Crisis Line at [phone], at any time, 24 hours a day, 7 days a week. If you are in danger or need immediate medical attention call 911.   Care plan and visit instructions communicated with the patient verbally today. Patient agrees to receive a copy in MyChart. Active MyChart status and patient understanding of how to access instructions and care plan via MyChart confirmed with patient.     Telephone follow up appointment with Managed Medicaid care management team member scheduled for: 07/25/2024 at 1pm  [name], [place]/VBCI - [facility] Social Worker [phone]   Following is a copy of your plan of care:   Goals Addressed             This Visit's Progress    BSW goal       Current SDOH Barriers:  Transportation Limited access to food SSI claim Dental need FNS re certification Physical [facility] - [facility]  Interventions: Patient interviewed and appropriate screenings performed Referred patient to community resources  Provided patient with information about out of the garden food project. Advised patient to complete FNS recertification ASAP and turn in to DSS before 11/15 to avoid interruption in FNS benefit past the 11/30. Advised pt he can renew online as well and encouraged him to reach out to caseworker [name] for assistance. Pt is seeking physical therapy through [facility] - on wait list  for several weeks now.  Provided pt with list of dental providers. 05/22/2024 Pt was informed by BSW that he is still [facility]. Patient confirmed he also received a call from them last week.  06/05/2024 BSW and pt called SSA [place] field office together and were unable to speak with an agent. Patient states he got a letter from [facility] requesting him to complete a form but applicant is not sure how to complete. BSW encouraged pt to call [facility] who helped him apply for guidance. 06/13/2024 Pt and BSW called [facility] and spoke with caseworker [name]. She informs [place] she will call SSA re his pending application and his need to update his home address and phone number with them. Pt was encouraged to complete SSA form and mail out asap. Pt intends to do this today. 07/03/2024 Pt has not re certified for FNS. Pt will send doctor letter to BSW for BSW to e-fax to DSS [place].  BSW and pt attempted to call SSA re his SSI application. No answer. BSW and pt called [name] with [facility] and was told she would contact SSA tomorrow and provide them his new phone number and mailing address. Pt was encouraged to call case worker re any questions he has about his SSI case. 07/18/2024 BSW received VM  from social worker with [employer] re FNS. BSW returned call and no answer. BSW left a VM requesting case manager reach out to pt about providing documentation for his FNS recertification and doctor letter. Case manager: Janae Rogers [phone]).  BSW will continue outreach to [facility] for medicaid case worker information.

## 2024-07-19 ENCOUNTER — Encounter: Admitting: Psychology

## 2024-07-19 ENCOUNTER — Encounter (HOSPITAL_COMMUNITY): Admitting: Physician Assistant

## 2024-07-19 DIAGNOSIS — I629 Nontraumatic intracranial hemorrhage, unspecified: Secondary | ICD-10-CM | POA: Diagnosis not present

## 2024-07-19 DIAGNOSIS — F4389 Other reactions to severe stress: Secondary | ICD-10-CM | POA: Diagnosis not present

## 2024-07-19 NOTE — Progress Notes (Signed)
 9p  Neuropsychology / Psychotherapy Note  [place]  Physical Medicine and Rehabilitation    Patient: Erik Santana  DOB: Jan 11, 1991  MRN: [id number]  Provider: Evalene DOROTHA Riff, PsyD Modality & Individuals Present: The patient was seen in-person, unaccompanied, by the provider in the PM&R outpatient clinic office.  Location: [facility] Physical Medicine & Rehabilitation [address] [place] [address]  Date of Service: 07/19/24 Duration of Service:  60 min for in-person, face to face, psychotherapy for adjustment to injury and psychiatric symptoms due to ABI within the context of premorbid anxiety/panic, depression, and history of trauma.  Record Review & Presenting Concerns (Per 02/23/24 intake):The patient is a 34 year old male referred for psychotherapy.  The patient has a past medical history, per records, of PTSD, ADHD, GAD. Hospital medical records (08/24/23) indicated the patient presented with left sided weakness, NIH of 7 on admission, and subsequent CT of the head showed  CT head showed ICH in right fronto parietal region likely to be embolic in nature. MRI showed unchanged appearance of intraparenchymal hematoma within the superior right frontal lobe. No mass lesion. Echo showed LVEF 55-60%, Patient underwent cerebral angiogram which showed no gross abnormalities identified in terms of aneurysms, dural AV fistula, A/V shunting, dissections, or  intraluminal filling defects.Dural venous sinuses grossly patent. Neurology recommended no antithrombotic due to acute ICH. Patient cleared for discharge to acute inpatient rehab.Neurology signed off. 09/22/2023 PM&R HPI indicated ... 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  Presented 08/21/2023 with acute onset of left-sided weakness as well as dizziness and headache.  He was able to call EMS himself.  EMS found him on the floor... CT of the head  showed a 3.9 x 2.4 x 4.1 cm parenchymal hemorrhage centered at the right frontal parietal junction.  No evidence of intraventricular extension or mass effect.  CT cervical spine negative.  CTA showed no intracranial large vessel occlusion or significant stenosis...MRI follow-up showed unchanged appearance of intraparenchymal hematoma within the superior right frontal lobe.  No mass lesion.  Arteriogram completed showing no gross abnormalities... [name] added for tobacco use.  Blood pressure controlled he has not required Cleviprex .  Therapy evaluations completed due to patient decreased functional mobility was admitted for a comprehensive rehab program. The patient met with Psychiatry with [facility] to establish care on 01/04/2024. Visit notes indicate the patient had been on Lexapro  but discontinued due to side effects. He had been off medications excluding muscle relaxer and buspirone  for about one month. Difficulties with panic/anxiety when in public settings became prominent. The patient reached out to PM&R for assistance with medications following that.  Visit notes from his PM&R visit on 02/08/2024 indicated he was started on trazodone  to help with sleep.  Prazosin  was discontinued due to dreams.  Valium  was resumed.  In notes indicated the physiatrist is reaching out to disability specialist for paperwork needs for his application.  Visit notes indicated spasticity and GERD were also present.   The patient presents with premorbid ADHD, anxiety, and history of trauma. 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 Difficulties with anxiety are prominent, and there is some concern for PTSD necessitating closer assessment. The patient demonstrates excellent insight into his difficulties and descriptions suggest  strong ability for perspective taking, but linked  to complex and challenging environment growing up. The patient is likely to benefit from individual therapy integrating mindfulness/relaxation strategies, along with EF cognitive rehab strategies if needed. Trauma treatment may be beneficial, and emotional suppression/avoidance may be factors exacerbating symptoms and warrant attention as well. Current session focused on obtaining background information and diagnostic information as well as establishing rapport and clarifying patient goals for treatment. Emotion regulation is primary focus, particularly with management of anxiety (cognitive and physiological Sx). Adjustment to injury work and more concrete goal setting (finding purpose/direction). Increased activity levels. 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  Adjustment to injury (ABI) Psychoeducation regarding cognitive and behavioral changes associated with ABI. Identify and adjust / compensate for cognitive changes.  Reduce symptoms of anxiety and panic Psychoeducation for S/Sx anxiety, stress, panic  Identify S/Sx anxiety and panic. Identify high risk situations/triggers Coping skills Learn and utilize relaxation strategies (progressing) Utilize CBT techniques (ABC, distortion ID, restructuring) Behavioral approach/exposure (Reduce avoidance behavior) Reduce symptoms of depression Psychoeducation of S/Sx Identify S/Sx in self/warning signs Behavioral activation Cognitive behavioral techniques Broad Goals Find and hold employment Develop new social network (healthy, supportive, avoid  old relational patterns incongruent with personal values/beliefs) Social group/activities Increase opportunity for social interaction through pursuit of personal interests.  Increase self love/learn to love self Self-compassion, awareness, acceptance.  Positive relationships ADHD?  Note: Trauma Hx; monitor for PTSD. Monitor for panic disorder.   Session Content: Goals focused on this session: Psychoeducation, behavioral, emotion regulation  Themes/Topics Discussed: BAI - 07/19/24 - Score 19 - Moderate (Reduced relative to 03/28/24 - 28 Severe) Discuss/compare  BDI-2 07/19/24 - Score of 21 - Moderate (Small increase - Score 19 (mild) 03/28/24) Reclusion/social avoidance. Limited avenue for engagement socially outside prior circles.  Pt insight shown regarding social anxiety and anticipatory element. Grandfather in hospital. Processed subjective experience, role and actions taken to care for grandfather. Provided praise/observed positive actions taken by patient. Explored reactivity/triggering nature of experience based on Hx.  Pt considering reading, not sure what enjoys now.  Leaves home, but limited social  Hx mistrust of authority.  Interpersonal relationships; qualities of prior relationships disliked/felt unsupportive (egocentric, taking more than giving, pulling for pt to help) Spirituality and religious beliefs. Interactions with avoidance? Largely ruled out.  Discussed provider concerns explicitly regarding avoidance and risks of, if excessive, results in lack of progress to patient primary goals.  Pt feels progressing in work with PT.  Discussed pt response to imaging.   Interventions and Techniques Used: Psychoed, process, behavioral, relational.  Behavioral/Assessment  Alertness/attention: alert. Attentive Orientation: intact Language: speech prosodic, fluent, well articulated. Receptive language intact.  Motor/sensory: ambulated independently and without issue.   Mood/affect: Mood and affect congruent. Baseline overall. Calm, positive affect.  Interaction: cooperative, engaged. Genuine.  Progress: pt responded well to intervention, shows motivation.  Barriers: Avoidance behavior, hypervigilance to physio sensation potentially/possible overinterpretation. Hx of trauma/reactivity compounded by elevated anxiety and possibly physiological/neurologic changes.  Suicide/Homicide/Aggression Risk: No SI or HI.   Plan: Pt wish to return to weekly. Scheduling next week. Focusing on identifying specific activities / avenues for increased activity outside the home (multiple possible benefits and hitting each cognitive area). Pt wants focus self care/develop more compassion for self.  Revisit  - Do breathing exercise 1x/day  - Read anx/panic psychoed handouts.  - CBT thought-feeling connection exercise/worsksheet  - Conversation with child-self exercise - Bring planner - Continue to engage in activities outside the home  Evalene DOROTHA Riff, PsyD Clinical Neuropsychology [address] [place], [place] [address] Main: [phone] Fax: [phone] [place]: [phone]  This report was generated using voice recognition software. While this document has been carefully reviewed, transcription errors may be present. I apologize in advance for any inconvenience. Please contact me if further clarification is needed.    Consent and Confidentiality (reviewed 02/23/24): The nature, purpose, anticipated course, modalities, risks and benefits, limitations, alternative treatment options, of psychotherapy were reviewed with the patient. The patient was informed of the expected benefits, potential risks, and limitations of treatment, as well as alternative treatment options. The patient was given the opportunity to ask questions. The patient understands that participation in psychotherapy is voluntary and may be discontinued at any time. Confidentiality of all information  shared during psychotherapy is maintained in accordance with federal and state laws, including the [employer] and Accountability Act (HIPAA). Psychotherapy notes are not disclosed without specific patient authorization, except as required by law (e.g., risk of harm to self or others, court order, or mandatory reporting). The patient has been informed of the limits of confidentiality and the circumstances under which information may be disclosed. The patient acknowledged understanding of the above information and consents to participate in psychotherapy under these terms.

## 2024-07-20 ENCOUNTER — Other Ambulatory Visit: Payer: Self-pay | Admitting: Physical Medicine and Rehabilitation

## 2024-07-20 ENCOUNTER — Ambulatory Visit: Admitting: Physical Therapy

## 2024-07-20 VITALS — BP 141/87 | HR 79

## 2024-07-20 DIAGNOSIS — R2689 Other abnormalities of gait and mobility: Secondary | ICD-10-CM

## 2024-07-20 DIAGNOSIS — R2681 Unsteadiness on feet: Secondary | ICD-10-CM

## 2024-07-20 DIAGNOSIS — R29818 Other symptoms and signs involving the nervous system: Secondary | ICD-10-CM

## 2024-07-20 DIAGNOSIS — M6281 Muscle weakness (generalized): Secondary | ICD-10-CM

## 2024-07-20 DIAGNOSIS — R278 Other lack of coordination: Secondary | ICD-10-CM

## 2024-07-20 DIAGNOSIS — F411 Generalized anxiety disorder: Secondary | ICD-10-CM

## 2024-07-20 NOTE — Therapy (Signed)
 " OUTPATIENT PHYSICAL THERAPY NEURO TREATMENT   Patient Name: Erik Santana. MRN: [id number] DOB:[birth year], 34 y.o., male Today's Date: 07/20/2024   PCP: Jaycee Greig PARAS, NP REFERRING PROVIDER: Emeline Joesph BROCKS, DO  END OF SESSION:  PT End of Session - 07/20/24 1536     Visit Number 3    Number of Visits 9   8 visits plus Eval   Date for Recertification  08/17/24    Authorization Type [employer]    Authorization Time Period 8 visits (07/18/24-08/24/24)    Authorization - Number of Visits 8    PT Start Time 1535    PT Stop Time 1617    PT Time Calculation (min) 42 min    Equipment Utilized During Treatment Gait belt;Other (comment)   Bioness L300 lower leg cuff, L AFO   Activity Tolerance Patient tolerated treatment well    Behavior During Therapy Impulsive;WFL for tasks assessed/performed          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   Past Surgical History:  Procedure Laterality Date   ESOPHAGOGASTRODUODENOSCOPY N/A 10/18/2012   Procedure: ESOPHAGOGASTRODUODENOSCOPY (EGD);  Surgeon: Lamar JONETTA Aho, MD;  Location: [facility];  Service: Endoscopy;  Laterality: N/A;   ESOPHAGOGASTRODUODENOSCOPY (EGD) WITH PROPOFOL  N/A 10/18/2012   Procedure: ESOPHAGOGASTRODUODENOSCOPY (EGD) WITH PROPOFOL ;  Surgeon: Lamar JONETTA Aho, MD;  Location: [facility];  Service: Endoscopy;  Laterality: N/A;   FINGER SURGERY     IR ANGIO INTRA EXTRACRAN SEL COM CAROTID INNOMINATE BILAT MOD SED  08/23/2023   IR ANGIO VERTEBRAL SEL SUBCLAVIAN INNOMINATE UNI L MOD SED  08/23/2023   IR ANGIO VERTEBRAL SEL VERTEBRAL UNI R MOD SED  08/23/2023   OPEN REDUCTION INTERNAL FIXATION (ORIF) PROXIMAL PHALANX Right 10/12/2022   Procedure: Right ring finger middle phalanx and proximal interphalangeal joint closed reduction internal percutaneous pinning;  Surgeon: Alyse Agent, MD;  Location: [facility];  Service: Orthopedics;  Laterality: Right;   Patient Active Problem List   Diagnosis Date Noted   Neuropathic pain 06/27/2024   Gynecomastia, male 05/05/2024   Left-sided chest wall pain 05/05/2024   Testicular discomfort 05/05/2024   Allodynia 05/04/2024   Hemisensory deficit 05/04/2024   Adjustment disorder with mixed anxiety and depressed mood 02/17/2024   Gastroesophageal reflux disease 02/08/2024   Seizure-like activity (HCC) 12/16/2023   PTSD (post-traumatic stress disorder) 12/07/2023   Left hemiparesis (HCC) 12/07/2023   Spasticity 12/07/2023   Emotional lability 10/05/2023   Left foot drop 10/05/2023   Cognitive change 08/30/2023   Intraparenchymal hematoma of brain (HCC) 08/24/2023   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   Anxiety disorder 10/27/2012   Hematemesis 10/18/2012   Other esophagitis 10/18/2012   Mallory-Weiss tear 10/18/2012    ONSET DATE: 05/29/2024 (date of referral)  REFERRING DIAG: I62.9 (ICD-10-CM) - Non-traumatic intracranial hemorrhage (HCC) M21.372 (ICD-10-CM) - Left foot drop  THERAPY DIAG:  Other abnormalities of gait and mobility  Other symptoms and signs involving the nervous system  Other lack of coordination  Muscle weakness (generalized)  Unsteadiness on feet  Rationale for Evaluation and Treatment: Rehabilitation  SUBJECTIVE:  SUBJECTIVE STATEMENT: Pt presents holding snacks and shoe insole. States his HEP is cool. No changes to report.  Wearing L AFO.   Pt accompanied by: self (drove)  PERTINENT HISTORY: Pt presented to hospital 08/21/23 with acute onset L sided weakness, dizziness, and HA.  CT of the head showed a 3.9 x 2.4 x 4.1 cm parenchymal hemorrhage centered at the right frontal  parietal junction.  Admitted to IP rehab 08/24/23 and discharged 09/22/23.  PT referral for: Evaluate and treat. Aquatherapy for ICH with L tone/hemiparesis; work on increased resistance, mobilization, endurance improvements and management of tone.  PMH includes ADD, anxiety, asthma, GERD, Mallory-Weiss tear (09/26/2012).  Per recent DO Engler note 06/27/24 pt with L hemiparesis, spasticity, anxiety disorder (panic attacks), neuropathic pain, and seizure like activity (waiting for further workup with neurology).  H/o Botox  for spasticity management.  PAIN:  Are you having pain? No  PRECAUTIONS: Fall; anxiety; L tone.  RED FLAGS: None   WEIGHT BEARING RESTRICTIONS: No  FALLS: Has patient fallen in last 6 months? 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  Independent with ambulation with use of L AFO.  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  Get ankle and toes on L side working.  Be able to run.  OBJECTIVE:  Note: Objective measures were completed at Evaluation unless otherwise noted.  DIAGNOSTIC FINDINGS: CT of head 08/21/23:  3.9 x 2.4 x 4.1 cm parenchymal hemorrhage centered at the right frontoparietal junction. No evidence of intraventricular extension. No mass effect.  COGNITION: Overall cognitive status: A&Ox4; appearing anxious and restless   SENSATION: More sensitive L foot on bottom; numbness reported all toes (light touch intact per pt report)  COORDINATION: Good B heel to shin coordination in sitting  EDEMA:  Mild swelling noted L foot  MUSCLE TONE: LLE: increased tone (10 beats clonus L ankle)  POSTURE: rounded shoulders and forward head  LOWER EXTREMITY ROM:     Passive  Right Eval Left Eval  Hip flexion    Hip extension    Hip abduction     Hip adduction    Hip internal rotation    Hip external rotation    Knee flexion    Knee extension    Ankle dorsiflexion  To neutral  Ankle plantarflexion    Ankle inversion    Ankle eversion     (Blank rows = not tested)  LOWER EXTREMITY MMT:    MMT Right Eval Left Eval  Hip flexion 5/5 4+/5  Hip extension    Hip abduction    Hip adduction    Hip internal rotation    Hip external rotation    Knee flexion 5/5 4/5  Knee extension 5/5 4+/5  Ankle dorsiflexion 5/5 1/5  Ankle plantarflexion At least 3/5 1-2/5  Ankle inversion    Ankle eversion    (Blank rows = not tested)  BED MOBILITY:  Independent  TRANSFERS: Sit to stand: Complete Independence  Assistive device utilized: None     Stand to sit: Complete Independence  Assistive device utilized: None      STAIRS: Findings: Level of Assistance: SBA, Stair Negotiation Technique: Alternating Pattern  with No Rails, Number of Stairs: 8, Height of Stairs: 6 inch   , and Comments: mild unsteadiness at times; pt needing to take extra time and  concentration to perform task; SBA for safety GAIT: Findings: Gait Characteristics: step through gait pattern, Distance walked: clinic distances, Assistive device utilized:None, Level of assistance: Complete Independence, and Comments: use of L AFO  FUNCTIONAL TESTS:  5 times sit to stand: 14.91 seconds no UE support Functional gait assessment: 21/30 FUNCTIONAL GAIT ASSESSMENT  Date: 07/06/24 Score  GAIT LEVEL SURFACE Instructions: Walk at your normal speed from here to the next mark (6 m) [20 ft]. (2) Mild impairment - Walks 6 m (20 ft) in less than 7 seconds but greater than 5.5 seconds, uses assistive device, slower speed, mild gait deviations, or deviates 15.24 -25.4 cm (6 -10 in) outside of the 30.48-cm (12-in) walkway width.  6.78 seconds  2.   CHANGE IN GAIT SPEED Instructions: Begin walking at your normal pace (for 1.5 m [5 ft]). When I tell you go, walk as fast as you can (for  1.5 m [5 ft]). When I tell you slow, walk as slowly as you can (for 1.5 m [5 ft]. (3) Normal - Able to smoothly change walking speed without loss of balance or gait deviation. Shows a significant difference in walking speeds between normal, fast, and slow speeds. Deviates no more than 15.24 cm (6 in) outside of the 30.48-cm (12-in) walkway width.  3.    GAIT WITH HORIZONTAL HEAD TURNS Instructions: Walk from here to the next mark 6 m (20 ft) away. Begin walking at your normal pace. Keep walking straight; after 3 steps, turn your head to the right and keep walking straight while looking to the right. After 3 more steps, turn your head to the left and keep walking straight while looking left. Continue alternating looking right and left. (3) Normal - Performs head turns smoothly with no change in gait. Deviates no more than 15.24 cm (6 in) outside 30.48-cm (12-in) walkway width.  4.   GAIT WITH VERTICAL HEAD TURNS Instructions: Walk from here to the next mark (6 m [20 ft]). Begin walking at your normal pace. Keep walking straight; after 3 steps, tip your head up and keep walking straight while looking up. After 3 more steps, tip your head down, keep walking straight while looking down. Continue  alternating looking up and down every 3 steps until you have completed 2 repetitions in each direction. (3) Normal - Performs head turns with no change in gait. Deviates no more than 15.24 cm (6 in) outside 30.48-cm (12-in) walkway width.  5.  GAIT AND PIVOT TURN Instructions: Begin with walking at your normal pace. When I tell you, turn and stop, turn as quickly as you can to face the opposite direction and stop. (3) Normal - Pivot turns safely within 3 seconds and stops quickly with no loss of balance  6.   STEP OVER OBSTACLE Instructions: Begin walking at your normal speed. When you come to the shoe box, step over it, not around it, and keep walking. (1) Moderate impairment - Is able to step over one shoe box  (11.43 cm [4.5 in] total height) but must slow down and adjust steps to clear box safely. May require verbal cueing.  7.   GAIT WITH NARROW BASE OF SUPPORT Instructions: Walk on the floor with arms folded across the chest, feet aligned heel to toe in tandem for a distance of 3.6 m [12 ft]. The number of steps taken in a straight line are counted for a maximum of 10 steps. (1) Moderate impairment - Ambulates 4 -7 steps.  8.  GAIT WITH EYES CLOSED Instructions: Walk at your normal speed from here to the next mark (6 m [20 ft]) with your eyes closed. (1) Moderate impairment - Walks 6 m (20 ft), slow speed, abnormal gait pattern, evidence for imbalance, deviates 25.4 -38.1 cm (10 -15 in) outside 30.48-cm (12-in) walkway width. Requires more than 9 seconds to ambulate 6 m (20 ft).  9.38 seconds  9.   AMBULATING BACKWARDS Instructions: Walk backwards until I tell you to stop (2) Mild impairment - Walks 6 m (20 ft), uses assistive device, slower speed, mild gait deviations, deviates 15.24 -25.4 cm (6 -10 in) outside 30.48-cm (12-in) walkway width.  12.47 seconds  10. STEPS Instructions: Walk up these stairs as you would at home (ie, using the rail if necessary). At the top turn around and walk down. (2) Mild impairment-Alternating feet, must use rail.  Total 21/30   Interpretation of scores: Non-Specific Older Adults Cutoff Score: <=22/30 = risk of falls Parkinsons Disease Cutoff score <15/30= fall risk (Hoehn & Yahr 1-4)  Minimally Clinically Important Difference (MCID)  Stroke (acute, subacute, and chronic) = MDC: 4.2 points Vestibular (acute) = MDC: 6 points Community Dwelling Older Adults =  MCID: 4 points Parkinsons Disease  =  MDC: 4.3 points  (Academy of Neurologic Physical Therapy (nd). Functional Gait Assessment. Retrieved from https://www.neuropt.org/docs/default-source/cpgs/core-outcome-measures/function-gait-assessment-pocket-guide-proof9-(2).pdf?sfvrsn=b38f35043_0.)**  PATIENT SURVEYS:   TBA  VITALS  Vitals:   07/20/24 1539  BP: (!) 141/87  Pulse: 79                                                                                                                                 TREATMENT: Self-care/home management/NMR  Assessed vitals in LUE while seated (see above) and WNL for session  Pt removed L AFO and placed custom orthotic in L shoe to have therapist assess gait:   Gait pattern: Extensor tone of LLE, step through pattern, and decreased ankle dorsiflexion- Left Distance walked: 40' Assistive device utilized: None Level of assistance: Modified independence Comments: Noted L foot in PF and inversion during swing phase, but also noted R foot following a PF/inversion trend. Assessed soles of shoes and noted increased wear on lateral aspect of B shoes, so unsure if pt naturally inverts w/gait or if he is displaying mirror movement   Donned Bioness L300 cuff to L anterior tibialis (see tablet 2 for details) to work on ankle DF and eversion. Pt demonstrated good eversion, but limited DF. Assessed ankle ROM and pt lacking at least 15 degrees of DF from neutral ankle position. Pt reports he avoids DF as this sets off his tone, so educated on importance of stretching and spasticity management.  Reviewed wall calf stretch from previous HEP and added back to HEP (see bolded below). Cued pt to focus on stretching L calf, not inducing pain.  SciFit multi-peaks level 5.0 for 8 minutes using BUE/BLEs for neural priming for reciprocal movement, dynamic cardiovascular warmup and increased  amplitude of stepping. Cued pt to focus on breathing and allowing calf to stretch.    PATIENT EDUCATION: Education details: Updates to HEP, encouragement to bring tens unit to next session, importance of stretching, deferred questions regarding gabapentin  to Dr. Emeline  Person educated: Patient Education method: Explanation, Demonstration, Verbal cues, and Handouts Education comprehension:  verbalized understanding, returned demonstration, verbal cues required, and needs further education  HOME EXERCISE PROGRAM: Access Code: [email or web address] URL: [email or web address] Date: 07/18/2024 Prepared by: Damien Caulk  Exercises - Seated Toe Curl  - 1 x daily - 3-5 x weekly - 1-2 sets - 5-10 reps - Seated Ankle Dorsiflexion AROM  - 1 x daily - 3-5 x weekly - 1-2 sets - 5-10 reps - Seated Heel Raise  - 1 x daily - 3-5 x weekly - 1-2 sets - 5-10 reps - Standing Hip Extension with Counter Support  - 1 x daily - 3-5 x weekly - 1-2 sets - 10 reps - Mini Squat with Counter Support  - 1 x daily - 3-5 x weekly - 1-2 sets - 10 reps - Standing Hip Abduction with Counter Support  - 1 x daily - 3-5 x weekly - 1-2 sets - 10 reps - Side Step Overs with Cones and Counter Support  - 1 x daily - 3-5 x weekly - 1-2 sets - 10 reps - Calf stretch  - 1 x daily - 7 x weekly - 60-90 seconds hold  GOALS: Goals reviewed with patient? Yes  SHORT TERM GOALS = LONG TERM GOALS (d/t length of POC): Target date: 08/03/2024  Pt will be independent with HEP in order to improve strength and balance in order to decrease fall risk and improve function at home for ADL's and for work. Baseline: To be issued Goal status: INITIAL  2.  Patient will increase Functional Gait Assessment score to 25/30 or greater as to reduce fall risk and improve dynamic gait safety with community ambulation. Baseline: 21/30 Goal status: INITIAL  3.  Patient will ascend/descend 8 stairs without rail assist independently without loss of balance to improve ability to navigate steps in community. Baseline: mild unsteadiness at times; pt needing to take extra time and concentration to perform task; SBA for safety Goal status: INITIAL   ASSESSMENT:  CLINICAL IMPRESSION: Emphasis of skilled PT session on facilitation of L ankle DF/eversion and ankle ROM. Pt brought custom orthotic to show therapist and orthotic does help stabilize  ankle in stance, but not swing. However, noted mirror movement on R foot as well. Pt's L ankle DF P/ROM very limited and pt acknowledged that he has not been stretching his ankle, so added calf stretch to HEP. Encouraged pt to bring personal Tens unit to next session as pt had good response to Bioness. Continue POC.   OBJECTIVE IMPAIRMENTS: Abnormal gait, decreased balance, decreased cognition, decreased knowledge of condition, decreased knowledge of use of DME, decreased mobility, difficulty walking, decreased ROM, decreased strength, decreased safety awareness, impaired sensation, and impaired tone.   ACTIVITY LIMITATIONS: stairs and locomotion level  PARTICIPATION LIMITATIONS: shopping, community activity, and occupation  PERSONAL FACTORS: Behavior pattern, Past/current experiences, Time since onset of injury/illness/exacerbation, and 1-2 comorbidities: PTSD, anxiety, ADD are also affecting patient's functional outcome.   REHAB POTENTIAL: Fair d/t time since intracranial hemorrhage  CLINICAL DECISION MAKING: Stable/uncomplicated  EVALUATION COMPLEXITY: Low  PLAN:  PT FREQUENCY: 2x/week  PT DURATION: 4 weeks  PLANNED INTERVENTIONS: [id number]- PT Re-evaluation, 97750- Physical Performance Testing, 97110-Therapeutic exercises, 97530- Therapeutic activity, [id number]- Neuromuscular re-education,  02464- Self Care, [id number]- Manual therapy, [id number]- Gait training, [phone]- Orthotic Initial, [id number]- Orthotic/Prosthetic subsequent, [id number]- Aquatic Therapy, [phone]- Electrical stimulation (manual), Patient/Family education, Balance training, Stair training, Taping, Joint mobilization, Vestibular training, DME instructions, Cryotherapy, Moist heat, and Biofeedback  PLAN FOR NEXT SESSION: Check on HEP.  Balance; LE strength; endurance.  Consider switching some land based therapy to aquatic based therapy sessions (none scheduled yet d/t pt appearing anxious with water). Bioness. Did he bring tens unit? Calf raises     Marlon BRAVO Gabriana Wilmott, PT, DPT 07/20/2024, 4:23 PM         "

## 2024-07-23 ENCOUNTER — Other Ambulatory Visit: Payer: Self-pay | Admitting: Physical Medicine and Rehabilitation

## 2024-07-24 ENCOUNTER — Ambulatory Visit: Admitting: Family

## 2024-07-25 ENCOUNTER — Ambulatory Visit: Admitting: Physical Therapy

## 2024-07-25 ENCOUNTER — Telehealth: Payer: Self-pay

## 2024-07-25 ENCOUNTER — Encounter: Admitting: Psychology

## 2024-07-25 VITALS — BP 149/91 | HR 86

## 2024-07-25 DIAGNOSIS — F411 Generalized anxiety disorder: Secondary | ICD-10-CM | POA: Diagnosis not present

## 2024-07-25 DIAGNOSIS — R2689 Other abnormalities of gait and mobility: Secondary | ICD-10-CM | POA: Diagnosis not present

## 2024-07-25 DIAGNOSIS — R278 Other lack of coordination: Secondary | ICD-10-CM

## 2024-07-25 DIAGNOSIS — F4389 Other reactions to severe stress: Secondary | ICD-10-CM

## 2024-07-25 DIAGNOSIS — R2681 Unsteadiness on feet: Secondary | ICD-10-CM

## 2024-07-25 DIAGNOSIS — R29818 Other symptoms and signs involving the nervous system: Secondary | ICD-10-CM

## 2024-07-25 DIAGNOSIS — I629 Nontraumatic intracranial hemorrhage, unspecified: Secondary | ICD-10-CM

## 2024-07-25 DIAGNOSIS — M6281 Muscle weakness (generalized): Secondary | ICD-10-CM

## 2024-07-25 NOTE — Progress Notes (Signed)
 .[id number]  Neuropsychology / Psychotherapy Note  [place]  Physical Medicine and Rehabilitation    Patient: Erik Santana  DOB: [birth year]  MRN: [id number]  Provider: Evalene DOROTHA Riff, PsyD Modality & Individuals Present: The patient was seen in-person, unaccompanied, by the provider in the PM&R outpatient clinic office.  Location: [facility] Physical Medicine & Rehabilitation [address] [place] [address]  Date of Service: 07/25/24 Duration of Service:  60 min for in-person, face to face, psychotherapy for adjustment to injury and psychiatric symptoms due to ABI within the context of premorbid anxiety/panic, depression, and history of trauma.  Record Review & Presenting Concerns (Per 02/23/24 intake):The patient is a 34 year old male referred for psychotherapy.  The patient has a past medical history, per records, of PTSD, ADHD, GAD. Hospital medical records (08/24/23) indicated the patient presented with left sided weakness, NIH of 7 on admission, and subsequent CT of the head showed  CT head showed ICH in right fronto parietal region likely to be embolic in nature. MRI showed unchanged appearance of intraparenchymal hematoma within the superior right frontal lobe. No mass lesion. Echo showed LVEF 55-60%, Patient underwent cerebral angiogram which showed no gross abnormalities identified in terms of aneurysms, dural AV fistula, A/V shunting, dissections, or  intraluminal filling defects.Dural venous sinuses grossly patent. Neurology recommended no antithrombotic due to acute ICH. Patient cleared for discharge to acute inpatient rehab.Neurology signed off. 09/22/2023 PM&R HPI indicated ... 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  Presented 08/21/2023 with acute onset of left-sided weakness as well as dizziness and headache.  He was able to call EMS himself.  EMS found him on the floor... CT of the head  showed a 3.9 x 2.4 x 4.1 cm parenchymal hemorrhage centered at the right frontal parietal junction.  No evidence of intraventricular extension or mass effect.  CT cervical spine negative.  CTA showed no intracranial large vessel occlusion or significant stenosis...MRI follow-up showed unchanged appearance of intraparenchymal hematoma within the superior right frontal lobe.  No mass lesion.  Arteriogram completed showing no gross abnormalities... [name] added for tobacco use.  Blood pressure controlled he has not required Cleviprex .  Therapy evaluations completed due to patient decreased functional mobility was admitted for a comprehensive rehab program. The patient met with Psychiatry with [facility] to establish care on 01/04/2024. Visit notes indicate the patient had been on Lexapro  but discontinued due to side effects. He had been off medications excluding muscle relaxer and buspirone  for about one month. Difficulties with panic/anxiety when in public settings became prominent. The patient reached out to PM&R for assistance with medications following that.  Visit notes from his PM&R visit on 02/08/2024 indicated he was started on trazodone  to help with sleep.  Prazosin  was discontinued due to dreams.  Valium  was resumed.  In notes indicated the physiatrist is reaching out to disability specialist for paperwork needs for his application.  Visit notes indicated spasticity and GERD were also present.   The patient presents with premorbid ADHD, anxiety, and history of trauma. 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 Difficulties with anxiety are prominent, and there is some concern for PTSD necessitating closer assessment. The patient demonstrates excellent insight into his difficulties and descriptions suggest  strong ability for perspective taking, but linked  to complex and challenging environment growing up. The patient is likely to benefit from individual therapy integrating mindfulness/relaxation strategies, along with EF cognitive rehab strategies if needed. Trauma treatment may be beneficial, and emotional suppression/avoidance may be factors exacerbating symptoms and warrant attention as well. Current session focused on obtaining background information and diagnostic information as well as establishing rapport and clarifying patient goals for treatment. Emotion regulation is primary focus, particularly with management of anxiety (cognitive and physiological Sx). Adjustment to injury work and more concrete goal setting (finding purpose/direction). Increased activity levels. 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  Adjustment to injury (ABI) Psychoeducation regarding cognitive and behavioral changes associated with ABI. Identify and adjust / compensate for cognitive changes.  Reduce symptoms of anxiety and panic Psychoeducation for S/Sx anxiety, stress, panic  Identify S/Sx anxiety and panic. Identify high risk situations/triggers Coping skills Learn and utilize relaxation strategies (progressing) Utilize CBT techniques (ABC, distortion ID, restructuring) Behavioral approach/exposure (Reduce avoidance behavior) Reduce symptoms of depression Psychoeducation of S/Sx Identify S/Sx in self/warning signs Behavioral activation Cognitive behavioral techniques Broad Goals Find and hold employment Develop new social network (healthy, supportive, avoid  old relational patterns incongruent with personal values/beliefs) Social group/activities Increase opportunity for social interaction through pursuit of personal interests.  Increase self love/learn to love self Self-compassion, awareness, acceptance.  Positive relationships ADHD?  Note: Trauma Hx; monitor for PTSD. Monitor for panic disorder.   Session Content: Goals focused on this session: Psychoeducation, behavioral, emotion regulation and interpersonal relationships  Themes/Topics Discussed: Discussed situation in home and medical care of grand parent.  Patient discussed family environment, dynamics, and patterns. (Inconsistent, unsupportive, inappropriate parental expectations Feeling conflicting emotions.  Supported animator.  Explored incongruencies b/t self judgement and understanding.  Reinforced self-compassion, balanced with accountability.  Explored and processed previous intimate relationships.  Worked to strength patient's existing good insight into others' behavior.  Provided empathic support. Explored ways in which past can help identify when falling into problematic patterns.  Explored interpersonal boundaries, stepping back, and identifying responses of others that appear due to other factors outside of ourselves.  Discussed work as a future target, and briefly discussed vocational rehabilitation idea.  Patient rejected for SSDI, is applying for SSI  Interventions and Techniques Used: Psychoed, process, behavioral, relational.  Behavioral/Assessment  Alertness/attention: alert. Attentive Orientation: intact Language: speech prosodic, fluent, well articulated. Receptive language intact.  Motor/sensory: ambulated independently and without issue.  Mood/affect: Irritable/congruent at times (situationally appropriate) and euthymic/positive at other points.  Interaction: cooperative, engaged. Genuine.  Progress: pt responded well to  intervention, shows motivation, very good insight.  Barriers: Avoidance behavior, hypervigilance to physio sensation potentially/possible overinterpretation. Hx of trauma/reactivity compounded by elevated anxiety and possibly physiological/neurologic changes.  Suicide/Homicide/Aggression Risk: No SI or HI.   Plan: Scheduled to meet next week. Situational factors are notable at present, elevating stress. Will continue exploring avenues for increasing activity.   Revisit  - Do breathing exercise 1x/day  - Read anx/panic psychoed handouts.  - CBT thought-feeling connection exercise/worsksheet  - Conversation with child-self exercise - Bring planner - Continue to engage in activities outside the home  Evalene DOROTHA Riff, PsyD Clinical Neuropsychology [address], [address] [place], [place] [address] Main: [phone]  Fax: [phone] [place]: [phone]  This report was generated using voice recognition software. While this document has been carefully reviewed, transcription errors may be present. I apologize in advance for any inconvenience. Please contact me if further clarification is needed.    Consent and Confidentiality (reviewed 02/23/24): The nature, purpose, anticipated course, modalities, risks and benefits, limitations, alternative treatment options, of psychotherapy were reviewed with the patient. The patient was informed of the expected benefits, potential risks, and limitations of treatment, as well as alternative treatment options. The patient was given the opportunity to ask questions. The patient understands that participation in psychotherapy is voluntary and may be discontinued at any time. Confidentiality of all information shared during psychotherapy is maintained in accordance with federal and state laws, including the [employer] and Accountability Act (HIPAA). Psychotherapy notes are not disclosed without specific patient authorization, except as required by law  (e.g., risk of harm to self or others, court order, or mandatory reporting). The patient has been informed of the limits of confidentiality and the circumstances under which information may be disclosed. The patient acknowledged understanding of the above information and consents to participate in psychotherapy under these terms.

## 2024-07-25 NOTE — Therapy (Signed)
 " OUTPATIENT PHYSICAL THERAPY NEURO TREATMENT   Patient Name: Orson Rho. MRN: [id number] DOB:[birth year], 34 y.o., male Today's Date: 07/25/2024   PCP: Jaycee Greig PARAS, NP REFERRING PROVIDER: Emeline Joesph BROCKS, DO  END OF SESSION:  PT End of Session - 07/25/24 1530     Visit Number 4    Number of Visits 9   8 visits plus Eval   Date for Recertification  08/17/24    Authorization Type [facility] Medicaid    Authorization Time Period 8 visits (07/18/24-08/24/24)    Authorization - Number of Visits 8    PT Start Time 1530    PT Stop Time 1617    PT Time Calculation (min) 47 min    Equipment Utilized During Treatment Other (comment)   L AFO   Activity Tolerance Patient tolerated treatment well    Behavior During Therapy Impulsive;WFL for tasks assessed/performed           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   Past Surgical History:  Procedure Laterality Date   ESOPHAGOGASTRODUODENOSCOPY N/A 10/18/2012   Procedure: ESOPHAGOGASTRODUODENOSCOPY (EGD);  Surgeon: Lamar JONETTA Aho, MD;  Location: [facility];  Service: Endoscopy;  Laterality: N/A;   ESOPHAGOGASTRODUODENOSCOPY (EGD) WITH PROPOFOL  N/A 10/18/2012   Procedure: ESOPHAGOGASTRODUODENOSCOPY (EGD) WITH PROPOFOL ;  Surgeon: Lamar JONETTA Aho, MD;  Location: [facility];  Service: Endoscopy;  Laterality: N/A;   FINGER SURGERY     IR ANGIO INTRA EXTRACRAN SEL COM CAROTID INNOMINATE BILAT MOD SED  08/23/2023   IR ANGIO VERTEBRAL SEL SUBCLAVIAN INNOMINATE UNI L MOD SED  08/23/2023   IR ANGIO VERTEBRAL SEL VERTEBRAL UNI R MOD SED  08/23/2023   OPEN REDUCTION INTERNAL FIXATION (ORIF) PROXIMAL PHALANX Right 10/12/2022   Procedure: Right ring finger middle phalanx and proximal interphalangeal joint closed reduction internal percutaneous pinning;  Surgeon: Alyse Agent, MD;  Location: [facility];  Service: Orthopedics;  Laterality: Right;   Patient Active Problem List   Diagnosis Date Noted   Neuropathic pain 06/27/2024   Gynecomastia, male 05/05/2024   Left-sided chest wall pain 05/05/2024   Testicular discomfort 05/05/2024   Allodynia 05/04/2024   Hemisensory deficit 05/04/2024   Adjustment disorder with mixed anxiety and depressed mood 02/17/2024   Gastroesophageal reflux disease 02/08/2024   Seizure-like activity (HCC) 12/16/2023   PTSD (post-traumatic stress disorder) 12/07/2023   Left hemiparesis (HCC) 12/07/2023   Spasticity 12/07/2023   Emotional lability 10/05/2023   Left foot drop 10/05/2023   Cognitive change 08/30/2023   Intraparenchymal hematoma of brain (HCC) 08/24/2023   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   Anxiety disorder 10/27/2012   Hematemesis 10/18/2012   Other esophagitis 10/18/2012   Mallory-Weiss tear 10/18/2012    ONSET DATE: 05/29/2024 (date of referral)  REFERRING DIAG: I62.9 (ICD-10-CM) - Non-traumatic intracranial hemorrhage (HCC) M21.372 (ICD-10-CM) - Left foot drop  THERAPY DIAG:  Other abnormalities of gait and mobility  Other symptoms and signs involving the nervous system  Other lack of coordination  Muscle weakness (generalized)  Unsteadiness on feet  Rationale for Evaluation and Treatment: Rehabilitation  SUBJECTIVE:  SUBJECTIVE STATEMENT: Pt presents holding snacks and wearing AFO. Denies acute changes or falls. Has been working on his stretches more.   Pt accompanied by: self (drove)  PERTINENT HISTORY: Pt presented to hospital 08/21/23 with acute onset L sided weakness, dizziness, and HA.  CT of the head showed a 3.9 x 2.4 x 4.1 cm parenchymal hemorrhage centered at the right frontal parietal junction.   Admitted to IP rehab 08/24/23 and discharged 09/22/23.  PT referral for: Evaluate and treat. Aquatherapy for ICH with L tone/hemiparesis; work on increased resistance, mobilization, endurance improvements and management of tone.  PMH includes ADD, anxiety, asthma, GERD, Mallory-Weiss tear (09/26/2012).  Per recent DO Engler note 06/27/24 pt with L hemiparesis, spasticity, anxiety disorder (panic attacks), neuropathic pain, and seizure like activity (waiting for further workup with neurology).  H/o Botox  for spasticity management.  PAIN:  Are you having pain? No  PRECAUTIONS: Fall; anxiety; L tone.  RED FLAGS: None   WEIGHT BEARING RESTRICTIONS: No  FALLS: Has patient fallen in last 6 months? 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  Independent with ambulation with use of L AFO.  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  Get ankle and toes on L side working.  Be able to run.  OBJECTIVE:  Note: Objective measures were completed at Evaluation unless otherwise noted.  DIAGNOSTIC FINDINGS: CT of head 08/21/23:  3.9 x 2.4 x 4.1 cm parenchymal hemorrhage centered at the right frontoparietal junction. No evidence of intraventricular extension. No mass effect.  COGNITION: Overall cognitive status: A&Ox4; appearing anxious and restless   SENSATION: More sensitive L foot on bottom; numbness reported all toes (light touch intact per pt report)  COORDINATION: Good B heel to shin coordination in sitting  EDEMA:  Mild swelling noted L foot  MUSCLE TONE: LLE: increased tone (10 beats clonus L ankle)  POSTURE: rounded shoulders and forward head  LOWER EXTREMITY ROM:     Passive  Right Eval Left Eval  Hip flexion    Hip extension    Hip abduction    Hip adduction     Hip internal rotation    Hip external rotation    Knee flexion    Knee extension    Ankle dorsiflexion  To neutral  Ankle plantarflexion    Ankle inversion    Ankle eversion     (Blank rows = not tested)  LOWER EXTREMITY MMT:    MMT Right Eval Left Eval  Hip flexion 5/5 4+/5  Hip extension    Hip abduction    Hip adduction    Hip internal rotation    Hip external rotation    Knee flexion 5/5 4/5  Knee extension 5/5 4+/5  Ankle dorsiflexion 5/5 1/5  Ankle plantarflexion At least 3/5 1-2/5  Ankle inversion    Ankle eversion    (Blank rows = not tested)  BED MOBILITY:  Independent  TRANSFERS: Sit to stand: Complete Independence  Assistive device utilized: None     Stand to sit: Complete Independence  Assistive device utilized: None      STAIRS: Findings: Level of Assistance: SBA, Stair Negotiation Technique: Alternating Pattern  with No Rails, Number of Stairs: 8, Height of Stairs: 6 inch   , and Comments: mild unsteadiness at times; pt needing to take extra time and concentration  to perform task; SBA for safety GAIT: Findings: Gait Characteristics: step through gait pattern, Distance walked: clinic distances, Assistive device utilized:None, Level of assistance: Complete Independence, and Comments: use of L AFO  FUNCTIONAL TESTS:  5 times sit to stand: 14.91 seconds no UE support Functional gait assessment: 21/30 FUNCTIONAL GAIT ASSESSMENT  Date: 07/06/24 Score  GAIT LEVEL SURFACE Instructions: Walk at your normal speed from here to the next mark (6 m) [20 ft]. (2) Mild impairment - Walks 6 m (20 ft) in less than 7 seconds but greater than 5.5 seconds, uses assistive device, slower speed, mild gait deviations, or deviates 15.24 -25.4 cm (6 -10 in) outside of the 30.48-cm (12-in) walkway width.  6.78 seconds  2.   CHANGE IN GAIT SPEED Instructions: Begin walking at your normal pace (for 1.5 m [5 ft]). When I tell you go, walk as fast as you can (for 1.5 m [5 ft]). When  I tell you slow, walk as slowly as you can (for 1.5 m [5 ft]. (3) Normal - Able to smoothly change walking speed without loss of balance or gait deviation. Shows a significant difference in walking speeds between normal, fast, and slow speeds. Deviates no more than 15.24 cm (6 in) outside of the 30.48-cm (12-in) walkway width.  3.    GAIT WITH HORIZONTAL HEAD TURNS Instructions: Walk from here to the next mark 6 m (20 ft) away. Begin walking at your normal pace. Keep walking straight; after 3 steps, turn your head to the right and keep walking straight while looking to the right. After 3 more steps, turn your head to the left and keep walking straight while looking left. Continue alternating looking right and left. (3) Normal - Performs head turns smoothly with no change in gait. Deviates no more than 15.24 cm (6 in) outside 30.48-cm (12-in) walkway width.  4.   GAIT WITH VERTICAL HEAD TURNS Instructions: Walk from here to the next mark (6 m [20 ft]). Begin walking at your normal pace. Keep walking straight; after 3 steps, tip your head up and keep walking straight while looking up. After 3 more steps, tip your head down, keep walking straight while looking down. Continue  alternating looking up and down every 3 steps until you have completed 2 repetitions in each direction. (3) Normal - Performs head turns with no change in gait. Deviates no more than 15.24 cm (6 in) outside 30.48-cm (12-in) walkway width.  5.  GAIT AND PIVOT TURN Instructions: Begin with walking at your normal pace. When I tell you, turn and stop, turn as quickly as you can to face the opposite direction and stop. (3) Normal - Pivot turns safely within 3 seconds and stops quickly with no loss of balance  6.   STEP OVER OBSTACLE Instructions: Begin walking at your normal speed. When you come to the shoe box, step over it, not around it, and keep walking. (1) Moderate impairment - Is able to step over one shoe box (11.43 cm [4.5 in]  total height) but must slow down and adjust steps to clear box safely. May require verbal cueing.  7.   GAIT WITH NARROW BASE OF SUPPORT Instructions: Walk on the floor with arms folded across the chest, feet aligned heel to toe in tandem for a distance of 3.6 m [12 ft]. The number of steps taken in a straight line are counted for a maximum of 10 steps. (1) Moderate impairment - Ambulates 4 -7 steps.  8.   GAIT  WITH EYES CLOSED Instructions: Walk at your normal speed from here to the next mark (6 m [20 ft]) with your eyes closed. (1) Moderate impairment - Walks 6 m (20 ft), slow speed, abnormal gait pattern, evidence for imbalance, deviates 25.4 -38.1 cm (10 -15 in) outside 30.48-cm (12-in) walkway width. Requires more than 9 seconds to ambulate 6 m (20 ft).  9.38 seconds  9.   AMBULATING BACKWARDS Instructions: Walk backwards until I tell you to stop (2) Mild impairment - Walks 6 m (20 ft), uses assistive device, slower speed, mild gait deviations, deviates 15.24 -25.4 cm (6 -10 in) outside 30.48-cm (12-in) walkway width.  12.47 seconds  10. STEPS Instructions: Walk up these stairs as you would at home (ie, using the rail if necessary). At the top turn around and walk down. (2) Mild impairment-Alternating feet, must use rail.  Total 21/30   Interpretation of scores: Non-Specific Older Adults Cutoff Score: <=22/30 = risk of falls Parkinsons Disease Cutoff score <15/30= fall risk (Hoehn & Yahr 1-4)  Minimally Clinically Important Difference (MCID)  Stroke (acute, subacute, and chronic) = MDC: 4.2 points Vestibular (acute) = MDC: 6 points Community Dwelling Older Adults =  MCID: 4 points Parkinsons Disease  =  MDC: 4.3 points  (Academy of Neurologic Physical Therapy (nd). Functional Gait Assessment. Retrieved from https://www.neuropt.org/docs/default-source/cpgs/core-outcome-measures/function-gait-assessment-pocket-guide-proof9-(2).pdf?sfvrsn=b63f35043_0.)**  PATIENT SURVEYS:  TBA  VITALS   Vitals:   07/25/24 1535  BP: (!) 149/91  Pulse: 86                                                                                                      TREATMENT: Self-care/home management/NMR  Assessed vitals in LUE while seated (see above) and BP slightly elevated but WNL for session.  Provided therapeutic listening as pt inquiring about his elevated BP and need for BP management. Pt reports his BP averages ~130/85 when he checks it at home and does not go above 150 even when stressed. Discussed lifestyle modifications to manage BP, including reducing fast food/sodium, being active and stress management. Pt verbalized understanding.  Lengthy discussion regarding effect that stress/anxiety has on spasticity and importance of working on stress management. Pt reports he caused his clonus in his leg when he had his hemorrhage, so educated pt on causes of clonus and informed him that he did not cause this himself. Pt states he has been working on healthier habits for his mind and body and can tell these are helping.  SciFit multi-peaks level 6.5 for 8 minutes using BLEs only for neural priming for reciprocal movement, dynamic cardiovascular warmup and L ankle ROM. Min cues provided throughout to facilitate relaxation of LLE as pt tensing up when clonus occurred. Noted reduced clonus if pt not tense.   BAPS board for improved L ankle proprioception and ROM:  Alt ankle PF/DF taps. Pt initially very tense and could not perform, so provided max multimodal cues to relax L foot and pt then able to perform. Educated pt on where to purchase board from [employer] as pt enjoyed using it.     PATIENT EDUCATION: Education  details: Continue HEP, encouragement to bring tens unit to next session, see self-care above  Person educated: Patient Education method: Explanation, Demonstration, Tactile cues, and Verbal cues Education comprehension: verbalized understanding, returned demonstration, verbal cues required,  tactile cues required, and needs further education  HOME EXERCISE PROGRAM: Access Code: [email or web address] URL: [email or web address] Date: 07/18/2024 Prepared by: Damien Caulk  Exercises - Seated Toe Curl  - 1 x daily - 3-5 x weekly - 1-2 sets - 5-10 reps - Seated Ankle Dorsiflexion AROM  - 1 x daily - 3-5 x weekly - 1-2 sets - 5-10 reps - Seated Heel Raise  - 1 x daily - 3-5 x weekly - 1-2 sets - 5-10 reps - Standing Hip Extension with Counter Support  - 1 x daily - 3-5 x weekly - 1-2 sets - 10 reps - Mini Squat with Counter Support  - 1 x daily - 3-5 x weekly - 1-2 sets - 10 reps - Standing Hip Abduction with Counter Support  - 1 x daily - 3-5 x weekly - 1-2 sets - 10 reps - Side Step Overs with Cones and Counter Support  - 1 x daily - 3-5 x weekly - 1-2 sets - 10 reps - Calf stretch  - 1 x daily - 7 x weekly - 60-90 seconds hold  GOALS: Goals reviewed with patient? Yes  SHORT TERM GOALS = LONG TERM GOALS (d/t length of POC): Target date: 08/03/2024  Pt will be independent with HEP in order to improve strength and balance in order to decrease fall risk and improve function at home for ADL's and for work. Baseline: To be issued Goal status: INITIAL  2.  Patient will increase Functional Gait Assessment score to 25/30 or greater as to reduce fall risk and improve dynamic gait safety with community ambulation. Baseline: 21/30 Goal status: INITIAL  3.  Patient will ascend/descend 8 stairs without rail assist independently without loss of balance to improve ability to navigate steps in community. Baseline: mild unsteadiness at times; pt needing to take extra time and concentration to perform task; SBA for safety Goal status: INITIAL   ASSESSMENT:  CLINICAL IMPRESSION: Emphasis of skilled PT session on improved L ankle proprioception, pt education and L ankle strengthening. Pt stressed at onset of session, coming from counseling session, but BP still within range for session.  Majority of session spent providing therapeutic listening and education on stress management, BP management and causes of clonus. Pt reporting he caused the clonus in his leg and continues to struggle w/relaxing foot when clonus occurs, but did improve relaxation w/multimodal cues from therapist and use of BAPS board. Continue POC.   OBJECTIVE IMPAIRMENTS: Abnormal gait, decreased balance, decreased cognition, decreased knowledge of condition, decreased knowledge of use of DME, decreased mobility, difficulty walking, decreased ROM, decreased strength, decreased safety awareness, impaired sensation, and impaired tone.   ACTIVITY LIMITATIONS: stairs and locomotion level  PARTICIPATION LIMITATIONS: shopping, community activity, and occupation  PERSONAL FACTORS: Behavior pattern, Past/current experiences, Time since onset of injury/illness/exacerbation, and 1-2 comorbidities: PTSD, anxiety, ADD are also affecting patient's functional outcome.   REHAB POTENTIAL: Fair d/t time since intracranial hemorrhage  CLINICAL DECISION MAKING: Stable/uncomplicated  EVALUATION COMPLEXITY: Low  PLAN:  PT FREQUENCY: 2x/week  PT DURATION: 4 weeks  PLANNED INTERVENTIONS: [id number]- PT Re-evaluation, 97750- Physical Performance Testing, 97110-Therapeutic exercises, 97530- Therapeutic activity, [id number]- Neuromuscular re-education, 97535- Self Care, [id number]- Manual therapy, [id number]- Gait training, [id number]- Orthotic Initial, [id number]- Orthotic/Prosthetic subsequent, [id number]- Aquatic Therapy, [phone]- Electrical stimulation (manual), Patient/Family education,  Balance training, Stair training, Taping, Joint mobilization, Vestibular training, DME instructions, Cryotherapy, Moist heat, and Biofeedback  PLAN FOR NEXT SESSION: Check on HEP.  Balance; LE strength; endurance.  Consider switching some land based therapy to aquatic based therapy sessions (none scheduled yet d/t pt appearing anxious with water). Bioness. Did he bring tens unit?  Calf raises. BAPS   Marlon BRAVO Loida Calamia, PT, DPT 07/25/2024, 4:24 PM         "

## 2024-07-27 ENCOUNTER — Ambulatory Visit: Admitting: Physical Therapy

## 2024-07-27 VITALS — BP 136/91 | HR 98

## 2024-07-27 DIAGNOSIS — R278 Other lack of coordination: Secondary | ICD-10-CM

## 2024-07-27 DIAGNOSIS — M6281 Muscle weakness (generalized): Secondary | ICD-10-CM

## 2024-07-27 DIAGNOSIS — R29818 Other symptoms and signs involving the nervous system: Secondary | ICD-10-CM

## 2024-07-27 DIAGNOSIS — R2681 Unsteadiness on feet: Secondary | ICD-10-CM

## 2024-07-27 DIAGNOSIS — R2689 Other abnormalities of gait and mobility: Secondary | ICD-10-CM

## 2024-07-27 NOTE — Therapy (Signed)
 " OUTPATIENT PHYSICAL THERAPY NEURO TREATMENT   Patient Name: Erik Santana. MRN: [id number] DOB:[birth year], 34 y.o., male Today's Date: 07/27/2024   PCP: Jaycee Greig PARAS, NP REFERRING PROVIDER: Emeline Joesph BROCKS, DO  END OF SESSION:  PT End of Session - 07/27/24 1533     Visit Number 5    Number of Visits 9   8 visits plus Eval   Date for Recertification  08/17/24    Authorization Type [facility] Medicaid    Authorization Time Period 8 visits (07/18/24-08/24/24)    Authorization - Number of Visits 8    PT Start Time 1532    PT Stop Time 1618    PT Time Calculation (min) 46 min    Equipment Utilized During Treatment Other (comment)   L AFO, TENS   Activity Tolerance Patient tolerated treatment well    Behavior During Therapy Impulsive;WFL for tasks assessed/performed           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   Past Surgical History:  Procedure Laterality Date   ESOPHAGOGASTRODUODENOSCOPY N/A 10/18/2012   Procedure: ESOPHAGOGASTRODUODENOSCOPY (EGD);  Surgeon: Lamar JONETTA Aho, MD;  Location: [facility];  Service: Endoscopy;  Laterality: N/A;   ESOPHAGOGASTRODUODENOSCOPY (EGD) WITH PROPOFOL  N/A 10/18/2012   Procedure: ESOPHAGOGASTRODUODENOSCOPY (EGD) WITH PROPOFOL ;  Surgeon: Lamar JONETTA Aho, MD;  Location: [facility];  Service: Endoscopy;  Laterality: N/A;   FINGER SURGERY     IR ANGIO INTRA EXTRACRAN SEL COM CAROTID INNOMINATE BILAT MOD SED  08/23/2023   IR ANGIO VERTEBRAL SEL SUBCLAVIAN INNOMINATE UNI L MOD SED  08/23/2023   IR ANGIO VERTEBRAL SEL VERTEBRAL UNI R MOD SED  08/23/2023   OPEN REDUCTION INTERNAL FIXATION (ORIF) PROXIMAL PHALANX Right 10/12/2022   Procedure: Right ring finger middle phalanx and proximal interphalangeal joint closed reduction internal percutaneous pinning;  Surgeon: Alyse Agent, MD;  Location:   SUR[facility]ERY CENTER;  Service: Orthopedics;  Laterality: Right;   Patient Active Problem List   Diagnosis Date Noted   Neuropathic pain 06/27/2024   Gynecomastia, male 05/05/2024   Left-sided chest wall pain 05/05/2024   Testicular discomfort 05/05/2024   Allodynia 05/04/2024   Hemisensory deficit 05/04/2024   Adjustment disorder with mixed anxiety and depressed mood 02/17/2024   Gastroesophageal reflux disease 02/08/2024   Seizure-like activity (HCC) 12/16/2023   PTSD (post-traumatic stress disorder) 12/07/2023   Left hemiparesis (HCC) 12/07/2023   Spasticity 12/07/2023   Emotional lability 10/05/2023   Left foot drop 10/05/2023   Cognitive change 08/30/2023   Intraparenchymal hematoma of brain (HCC) 08/24/2023   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   Anxiety disorder 10/27/2012   Hematemesis 10/18/2012   Other esophagitis 10/18/2012   Mallory-Weiss tear 10/18/2012    ONSET DATE: 05/29/2024 (date of referral)  REFERRING DIAG: I62.9 (ICD-10-CM) - Non-traumatic intracranial hemorrhage (HCC) M21.372 (ICD-10-CM) - Left foot drop  THERAPY DIAG:  Other abnormalities of gait and mobility  Other symptoms and signs involving the nervous system  Other lack of coordination  Muscle weakness (generalized)  Unsteadiness on feet  Rationale for Evaluation and Treatment: Rehabilitation  SUBJECTIVE:  SUBJECTIVE STATEMENT: Pt presents holding snacks and wearing AFO. Brought his TENS unit today. Had a fall prior to session, slipped on ice and fell on R side. Denies injuries, just some soreness on his R side.   Pt accompanied by: self (drove)  PERTINENT HISTORY: Pt presented to hospital 08/21/23 with acute onset L sided weakness, dizziness, and HA.  CT of the head showed a 3.9 x 2.4 x 4.1 cm  parenchymal hemorrhage centered at the right frontal parietal junction.  Admitted to IP rehab 08/24/23 and discharged 09/22/23.  PT referral for: Evaluate and treat. Aquatherapy for ICH with L tone/hemiparesis; work on increased resistance, mobilization, endurance improvements and management of tone.  PMH includes ADD, anxiety, asthma, GERD, Mallory-Weiss tear (09/26/2012).  Per recent DO Engler note 06/27/24 pt with L hemiparesis, spasticity, anxiety disorder (panic attacks), neuropathic pain, and seizure like activity (waiting for further workup with neurology).  H/o Botox  for spasticity management.  PAIN:  Are you having pain? No  PRECAUTIONS: Fall; anxiety; L tone.  RED FLAGS: None   WEIGHT BEARING RESTRICTIONS: No  FALLS: Has patient fallen in last 6 months? 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  Independent with ambulation with use of L AFO.  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  Get ankle and toes on L side working.  Be able to run.  OBJECTIVE:  Note: Objective measures were completed at Evaluation unless otherwise noted.  DIAGNOSTIC FINDINGS: CT of head 08/21/23:  3.9 x 2.4 x 4.1 cm parenchymal hemorrhage centered at the right frontoparietal junction. No evidence of intraventricular extension. No mass effect.  COGNITION: Overall cognitive status: A&Ox4; appearing anxious and restless   SENSATION: More sensitive L foot on bottom; numbness reported all toes (light touch intact per pt report)  COORDINATION: Good B heel to shin coordination in sitting  EDEMA:  Mild swelling noted L foot  MUSCLE TONE: LLE: increased tone (10 beats clonus L ankle)  POSTURE: rounded shoulders and forward head  LOWER EXTREMITY ROM:     Passive  Right Eval Left Eval   Hip flexion    Hip extension    Hip abduction    Hip adduction    Hip internal rotation    Hip external rotation    Knee flexion    Knee extension    Ankle dorsiflexion  To neutral  Ankle plantarflexion    Ankle inversion    Ankle eversion     (Blank rows = not tested)  LOWER EXTREMITY MMT:    MMT Right Eval Left Eval  Hip flexion 5/5 4+/5  Hip extension    Hip abduction    Hip adduction    Hip internal rotation    Hip external rotation    Knee flexion 5/5 4/5  Knee extension 5/5 4+/5  Ankle dorsiflexion 5/5 1/5  Ankle plantarflexion At least 3/5 1-2/5  Ankle inversion    Ankle eversion    (Blank rows = not tested)  BED MOBILITY:  Independent  TRANSFERS: Sit to stand: Complete Independence  Assistive device utilized: None     Stand to sit: Complete Independence  Assistive device utilized: None      STAIRS: Findings: Level of Assistance: SBA, Stair Negotiation Technique: Alternating Pattern  with No Rails, Number of Stairs: 8, Height of Stairs: 6 inch   ,  and Comments: mild unsteadiness at times; pt needing to take extra time and concentration to perform task; SBA for safety GAIT: Findings: Gait Characteristics: step through gait pattern, Distance walked: clinic distances, Assistive device utilized:None, Level of assistance: Complete Independence, and Comments: use of L AFO  FUNCTIONAL TESTS:  5 times sit to stand: 14.91 seconds no UE support Functional gait assessment: 21/30 FUNCTIONAL GAIT ASSESSMENT  Date: 07/06/24 Score  GAIT LEVEL SURFACE Instructions: Walk at your normal speed from here to the next mark (6 m) [20 ft]. (2) Mild impairment - Walks 6 m (20 ft) in less than 7 seconds but greater than 5.5 seconds, uses assistive device, slower speed, mild gait deviations, or deviates 15.24 -25.4 cm (6 -10 in) outside of the 30.48-cm (12-in) walkway width.  6.78 seconds  2.   CHANGE IN GAIT SPEED Instructions: Begin walking at your normal pace (for 1.5 m [5 ft]).  When I tell you go, walk as fast as you can (for 1.5 m [5 ft]). When I tell you slow, walk as slowly as you can (for 1.5 m [5 ft]. (3) Normal - Able to smoothly change walking speed without loss of balance or gait deviation. Shows a significant difference in walking speeds between normal, fast, and slow speeds. Deviates no more than 15.24 cm (6 in) outside of the 30.48-cm (12-in) walkway width.  3.    GAIT WITH HORIZONTAL HEAD TURNS Instructions: Walk from here to the next mark 6 m (20 ft) away. Begin walking at your normal pace. Keep walking straight; after 3 steps, turn your head to the right and keep walking straight while looking to the right. After 3 more steps, turn your head to the left and keep walking straight while looking left. Continue alternating looking right and left. (3) Normal - Performs head turns smoothly with no change in gait. Deviates no more than 15.24 cm (6 in) outside 30.48-cm (12-in) walkway width.  4.   GAIT WITH VERTICAL HEAD TURNS Instructions: Walk from here to the next mark (6 m [20 ft]). Begin walking at your normal pace. Keep walking straight; after 3 steps, tip your head up and keep walking straight while looking up. After 3 more steps, tip your head down, keep walking straight while looking down. Continue  alternating looking up and down every 3 steps until you have completed 2 repetitions in each direction. (3) Normal - Performs head turns with no change in gait. Deviates no more than 15.24 cm (6 in) outside 30.48-cm (12-in) walkway width.  5.  GAIT AND PIVOT TURN Instructions: Begin with walking at your normal pace. When I tell you, turn and stop, turn as quickly as you can to face the opposite direction and stop. (3) Normal - Pivot turns safely within 3 seconds and stops quickly with no loss of balance  6.   STEP OVER OBSTACLE Instructions: Begin walking at your normal speed. When you come to the shoe box, step over it, not around it, and keep walking. (1) Moderate  impairment - Is able to step over one shoe box (11.43 cm [4.5 in] total height) but must slow down and adjust steps to clear box safely. May require verbal cueing.  7.   GAIT WITH NARROW BASE OF SUPPORT Instructions: Walk on the floor with arms folded across the chest, feet aligned heel to toe in tandem for a distance of 3.6 m [12 ft]. The number of steps taken in a straight line are counted for a maximum of 10  steps. (1) Moderate impairment - Ambulates 4 -7 steps.  8.   GAIT WITH EYES CLOSED Instructions: Walk at your normal speed from here to the next mark (6 m [20 ft]) with your eyes closed. (1) Moderate impairment - Walks 6 m (20 ft), slow speed, abnormal gait pattern, evidence for imbalance, deviates 25.4 -38.1 cm (10 -15 in) outside 30.48-cm (12-in) walkway width. Requires more than 9 seconds to ambulate 6 m (20 ft).  9.38 seconds  9.   AMBULATING BACKWARDS Instructions: Walk backwards until I tell you to stop (2) Mild impairment - Walks 6 m (20 ft), uses assistive device, slower speed, mild gait deviations, deviates 15.24 -25.4 cm (6 -10 in) outside 30.48-cm (12-in) walkway width.  12.47 seconds  10. STEPS Instructions: Walk up these stairs as you would at home (ie, using the rail if necessary). At the top turn around and walk down. (2) Mild impairment-Alternating feet, must use rail.  Total 21/30   Interpretation of scores: Non-Specific Older Adults Cutoff Score: <=22/30 = risk of falls Parkinsons Disease Cutoff score <15/30= fall risk (Hoehn & Yahr 1-4)  Minimally Clinically Important Difference (MCID)  Stroke (acute, subacute, and chronic) = MDC: 4.2 points Vestibular (acute) = MDC: 6 points Community Dwelling Older Adults =  MCID: 4 points Parkinsons Disease  =  MDC: 4.3 points  (Academy of Neurologic Physical Therapy (nd). Functional Gait Assessment. Retrieved from  https://www.neuropt.org/docs/default-source/cpgs/core-outcome-measures/function-gait-assessment-pocket-guide-proof9-(2).pdf?sfvrsn=b23f35043_0.)**  PATIENT SURVEYS:  TBA  VITALS  Vitals:   07/27/24 1536  BP: (!) 136/91  Pulse: 98                                                                                                       TREATMENT: Self-care/home management/NMR/Ther act  Assessed vitals in LUE while seated (see above) and BP WNL.  Assessed pt's TENS unit and applied pads to pt's L anterior tibialis. Educated pt on proper pad set up and how to adjust intensity, mode an time on device. Placed device on PMS setting #11, as this was the only setting with an on/off period. Encouraged pt to try this for 10-15 minutes max at home while relaxed, as this will fatigue his leg. Pt verbalized understanding. Pt inquiring if there is another NMES unit that therapist recommends, but unable to find one on [employer] that seems user friendly, so will continue to look and informed pt to use what he has for now.  BAPS board for improved L ankle proprioception and ROM:  Attempted ankle DF/PF and pt having difficulty relaxing L ankle to allow foot to perform PF. Discussed importance of allowing ankle to relax, as keeping it tense will worsen clonus and fatigue leg more quickly. Provided analogy of walking on sand and allowing ankle to move to provide stability and pt able to relax foot. Pt also able to relax foot w/use of tactile cues (oscillations). Recommended pt continue to work on this at home.    PATIENT EDUCATION: Education details: Continue HEP, How to use PMS setting on TENS unit Education method: Explanation, Demonstration, Tactile cues, and Verbal cues Education comprehension:  verbalized understanding, returned demonstration, verbal cues required, tactile cues required, and needs further education  HOME EXERCISE PROGRAM: Access Code: [email or web address] URL: [email or web address] Date:  07/18/2024 Prepared by: Damien Caulk  Exercises - Seated Toe Curl  - 1 x daily - 3-5 x weekly - 1-2 sets - 5-10 reps - Seated Ankle Dorsiflexion AROM  - 1 x daily - 3-5 x weekly - 1-2 sets - 5-10 reps - Seated Heel Raise  - 1 x daily - 3-5 x weekly - 1-2 sets - 5-10 reps - Standing Hip Extension with Counter Support  - 1 x daily - 3-5 x weekly - 1-2 sets - 10 reps - Mini Squat with Counter Support  - 1 x daily - 3-5 x weekly - 1-2 sets - 10 reps - Standing Hip Abduction with Counter Support  - 1 x daily - 3-5 x weekly - 1-2 sets - 10 reps - Side Step Overs with Cones and Counter Support  - 1 x daily - 3-5 x weekly - 1-2 sets - 10 reps - Calf stretch  - 1 x daily - 7 x weekly - 60-90 seconds hold  GOALS: Goals reviewed with patient? Yes  SHORT TERM GOALS = LONG TERM GOALS (d/t length of POC): Target date: 08/03/2024  Pt will be independent with HEP in order to improve strength and balance in order to decrease fall risk and improve function at home for ADL's and for work. Baseline: To be issued Goal status: INITIAL  2.  Patient will increase Functional Gait Assessment score to 25/30 or greater as to reduce fall risk and improve dynamic gait safety with community ambulation. Baseline: 21/30 Goal status: INITIAL  3.  Patient will ascend/descend 8 stairs without rail assist independently without loss of balance to improve ability to navigate steps in community. Baseline: mild unsteadiness at times; pt needing to take extra time and concentration to perform task; SBA for safety Goal status: INITIAL   ASSESSMENT:  CLINICAL IMPRESSION: Emphasis of skilled PT session on improved L ankle proprioception, ROM and how to use PMS setting on pt's TENS unit at home. Pt's unit has TENS, massage and PMS settings, so educated pt on which one to use to facilitate L ankle DF at home and pt verbalized understanding. Attempted to perform L ankle DF/PF taps on BAPS board today, but pt very tense in L  ankle. Pt was able to relax w/multimodal cues and had the most success w/tactile cues. When relaxed, therapist able to place ankle into DF w/fatiguing clonus (3 beats), when pt typically has sustained clonus. Continue POC.   OBJECTIVE IMPAIRMENTS: Abnormal gait, decreased balance, decreased cognition, decreased knowledge of condition, decreased knowledge of use of DME, decreased mobility, difficulty walking, decreased ROM, decreased strength, decreased safety awareness, impaired sensation, and impaired tone.   ACTIVITY LIMITATIONS: stairs and locomotion level  PARTICIPATION LIMITATIONS: shopping, community activity, and occupation  PERSONAL FACTORS: Behavior pattern, Past/current experiences, Time since onset of injury/illness/exacerbation, and 1-2 comorbidities: PTSD, anxiety, ADD are also affecting patient's functional outcome.   REHAB POTENTIAL: Fair d/t time since intracranial hemorrhage  CLINICAL DECISION MAKING: Stable/uncomplicated  EVALUATION COMPLEXITY: Low  PLAN:  PT FREQUENCY: 2x/week  PT DURATION: 4 weeks  PLANNED INTERVENTIONS: [id number]- PT Re-evaluation, 97750- Physical Performance Testing, 97110-Therapeutic exercises, 97530- Therapeutic activity, [id number]- Neuromuscular re-education, 97535- Self Care, [id number]- Manual therapy, [id number]- Gait training, [phone]- Orthotic Initial, [id number]- Orthotic/Prosthetic subsequent, [phone]- Aquatic Therapy, [phone]- Electrical stimulation (manual), Patient/Family education, Balance training, Stair training, Taping, Joint mobilization, Vestibular training, DME  instructions, Cryotherapy, Moist heat, and Biofeedback  PLAN FOR NEXT SESSION: Check on HEP.  Balance; LE strength; endurance.  Consider switching some land based therapy to aquatic based therapy sessions (none scheduled yet d/t pt appearing anxious with water). Bioness. How is TENS unit? Calf raises. BAPS   Marlon BRAVO Aristotelis Vilardi, PT, DPT 07/27/2024, 4:23 PM         "

## 2024-07-30 ENCOUNTER — Encounter: Attending: Physical Medicine and Rehabilitation | Admitting: Psychology

## 2024-08-01 ENCOUNTER — Ambulatory Visit: Admitting: Physical Therapy

## 2024-08-01 VITALS — BP 147/79 | HR 85

## 2024-08-01 DIAGNOSIS — R2681 Unsteadiness on feet: Secondary | ICD-10-CM

## 2024-08-01 DIAGNOSIS — M6281 Muscle weakness (generalized): Secondary | ICD-10-CM

## 2024-08-01 DIAGNOSIS — R2689 Other abnormalities of gait and mobility: Secondary | ICD-10-CM

## 2024-08-01 DIAGNOSIS — R29818 Other symptoms and signs involving the nervous system: Secondary | ICD-10-CM

## 2024-08-01 DIAGNOSIS — R278 Other lack of coordination: Secondary | ICD-10-CM

## 2024-08-01 NOTE — Therapy (Signed)
 " OUTPATIENT PHYSICAL THERAPY NEURO TREATMENT   Patient Name: Erik Santana. MRN: [id number] DOB:07/13/1990, 34 y.o., male Today's Date: 08/01/2024   PCP: Jaycee Greig PARAS, NP REFERRING PROVIDER: Emeline Joesph BROCKS, DO  END OF SESSION:  PT End of Session - 08/01/24 1522     Visit Number 6    Number of Visits 9   8 visits plus Eval   Date for Recertification  08/17/24    Authorization Type [facility] Medicaid    Authorization Time Period 8 visits (07/18/24-08/24/24)    Authorization - Number of Visits 8    PT Start Time 1531    PT Stop Time 1613    PT Time Calculation (min) 42 min    Equipment Utilized During Treatment Other (comment)   L AFO   Activity Tolerance Patient tolerated treatment well    Behavior During Therapy Impulsive;WFL for tasks assessed/performed           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   Past Surgical History:  Procedure Laterality Date   ESOPHAGOGASTRODUODENOSCOPY N/A 10/18/2012   Procedure: ESOPHAGOGASTRODUODENOSCOPY (EGD);  Surgeon: Lamar JONETTA Aho, MD;  Location: [facility];  Service: Endoscopy;  Laterality: N/A;   ESOPHAGOGASTRODUODENOSCOPY (EGD) WITH PROPOFOL  N/A 10/18/2012   Procedure: ESOPHAGOGASTRODUODENOSCOPY (EGD) WITH PROPOFOL ;  Surgeon: Lamar JONETTA Aho, MD;  Location: [facility];  Service: Endoscopy;  Laterality: N/A;   FINGER SURGERY     IR ANGIO INTRA EXTRACRAN SEL COM CAROTID INNOMINATE BILAT MOD SED  08/23/2023   IR ANGIO VERTEBRAL SEL SUBCLAVIAN INNOMINATE UNI L MOD SED  08/23/2023   IR ANGIO VERTEBRAL SEL VERTEBRAL UNI R MOD SED  08/23/2023   OPEN REDUCTION INTERNAL FIXATION (ORIF) PROXIMAL PHALANX Right 10/12/2022   Procedure: Right ring finger middle phalanx and proximal interphalangeal joint closed reduction internal percutaneous pinning;  Surgeon: Alyse Agent, MD;  Location: [facility];  Service: Orthopedics;  Laterality: Right;   Patient Active Problem List   Diagnosis Date Noted   Neuropathic pain 06/27/2024   Gynecomastia, male 05/05/2024   Left-sided chest wall pain 05/05/2024   Testicular discomfort 05/05/2024   Allodynia 05/04/2024   Hemisensory deficit 05/04/2024   Adjustment disorder with mixed anxiety and depressed mood 02/17/2024   Gastroesophageal reflux disease 02/08/2024   Seizure-like activity (HCC) 12/16/2023   PTSD (post-traumatic stress disorder) 12/07/2023   Left hemiparesis (HCC) 12/07/2023   Spasticity 12/07/2023   Emotional lability 10/05/2023   Left foot drop 10/05/2023   Cognitive change 08/30/2023   Intraparenchymal hematoma of brain (HCC) 08/24/2023   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   Anxiety disorder 10/27/2012   Hematemesis 10/18/2012   Other esophagitis 10/18/2012   Mallory-Weiss tear 10/18/2012    ONSET DATE: 05/29/2024 (date of referral)  REFERRING DIAG: I62.9 (ICD-10-CM) - Non-traumatic intracranial hemorrhage (HCC) M21.372 (ICD-10-CM) - Left foot drop  THERAPY DIAG:  Other abnormalities of gait and mobility  Other symptoms and signs involving the nervous system  Other lack of coordination  Muscle weakness (generalized)  Unsteadiness on feet  Rationale for Evaluation and Treatment: Rehabilitation  SUBJECTIVE:  SUBJECTIVE STATEMENT: Pt presents holding snacks and wearing AFO. States he has been working on his HEP and was able to work on calf raises and activate his LLE. Has not used his TENS unit. No falls.   Pt accompanied by: self (drove)  PERTINENT HISTORY: Pt presented to hospital 08/21/23 with acute onset L sided weakness, dizziness, and HA.  CT of the head showed a 3.9 x 2.4 x 4.1 cm parenchymal  hemorrhage centered at the right frontal parietal junction.  Admitted to IP rehab 08/24/23 and discharged 09/22/23.  PT referral for: Evaluate and treat. Aquatherapy for ICH with L tone/hemiparesis; work on increased resistance, mobilization, endurance improvements and management of tone.  PMH includes ADD, anxiety, asthma, GERD, Mallory-Weiss tear (09/26/2012).  Per recent DO Engler note 06/27/24 pt with L hemiparesis, spasticity, anxiety disorder (panic attacks), neuropathic pain, and seizure like activity (waiting for further workup with neurology).  H/o Botox  for spasticity management.  PAIN:  Are you having pain? No  PRECAUTIONS: Fall; anxiety; L tone.  RED FLAGS: None   WEIGHT BEARING RESTRICTIONS: No  FALLS: Has patient fallen in last 6 months? 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  Independent with ambulation with use of L AFO.  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  Get ankle and toes on L side working.  Be able to run.  OBJECTIVE:  Note: Objective measures were completed at Evaluation unless otherwise noted.  DIAGNOSTIC FINDINGS: CT of head 08/21/23:  3.9 x 2.4 x 4.1 cm parenchymal hemorrhage centered at the right frontoparietal junction. No evidence of intraventricular extension. No mass effect.  COGNITION: Overall cognitive status: A&Ox4; appearing anxious and restless   SENSATION: More sensitive L foot on bottom; numbness reported all toes (light touch intact per pt report)  COORDINATION: Good B heel to shin coordination in sitting  EDEMA:  Mild swelling noted L foot  MUSCLE TONE: LLE: increased tone (10 beats clonus L ankle)  POSTURE: rounded shoulders and forward head  LOWER EXTREMITY ROM:     Passive  Right Eval Left Eval  Hip  flexion    Hip extension    Hip abduction    Hip adduction    Hip internal rotation    Hip external rotation    Knee flexion    Knee extension    Ankle dorsiflexion  To neutral  Ankle plantarflexion    Ankle inversion    Ankle eversion     (Blank rows = not tested)  LOWER EXTREMITY MMT:    MMT Right Eval Left Eval  Hip flexion 5/5 4+/5  Hip extension    Hip abduction    Hip adduction    Hip internal rotation    Hip external rotation    Knee flexion 5/5 4/5  Knee extension 5/5 4+/5  Ankle dorsiflexion 5/5 1/5  Ankle plantarflexion At least 3/5 1-2/5  Ankle inversion    Ankle eversion    (Blank rows = not tested)  BED MOBILITY:  Independent  TRANSFERS: Sit to stand: Complete Independence  Assistive device utilized: None     Stand to sit: Complete Independence  Assistive device utilized: None      STAIRS: Findings: Level of Assistance: SBA, Stair Negotiation Technique: Alternating Pattern  with No Rails, Number of Stairs: 8, Height of Stairs: 6 inch   ,  and Comments: mild unsteadiness at times; pt needing to take extra time and concentration to perform task; SBA for safety GAIT: Findings: Gait Characteristics: step through gait pattern, Distance walked: clinic distances, Assistive device utilized:None, Level of assistance: Complete Independence, and Comments: use of L AFO  FUNCTIONAL TESTS:  5 times sit to stand: 14.91 seconds no UE support Functional gait assessment: 21/30 FUNCTIONAL GAIT ASSESSMENT  Date: 07/06/24 Score  GAIT LEVEL SURFACE Instructions: Walk at your normal speed from here to the next mark (6 m) [20 ft]. (2) Mild impairment - Walks 6 m (20 ft) in less than 7 seconds but greater than 5.5 seconds, uses assistive device, slower speed, mild gait deviations, or deviates 15.24 -25.4 cm (6 -10 in) outside of the 30.48-cm (12-in) walkway width.  6.78 seconds  2.   CHANGE IN GAIT SPEED Instructions: Begin walking at your normal pace (for 1.5 m [5 ft]). When I  tell you go, walk as fast as you can (for 1.5 m [5 ft]). When I tell you slow, walk as slowly as you can (for 1.5 m [5 ft]. (3) Normal - Able to smoothly change walking speed without loss of balance or gait deviation. Shows a significant difference in walking speeds between normal, fast, and slow speeds. Deviates no more than 15.24 cm (6 in) outside of the 30.48-cm (12-in) walkway width.  3.    GAIT WITH HORIZONTAL HEAD TURNS Instructions: Walk from here to the next mark 6 m (20 ft) away. Begin walking at your normal pace. Keep walking straight; after 3 steps, turn your head to the right and keep walking straight while looking to the right. After 3 more steps, turn your head to the left and keep walking straight while looking left. Continue alternating looking right and left. (3) Normal - Performs head turns smoothly with no change in gait. Deviates no more than 15.24 cm (6 in) outside 30.48-cm (12-in) walkway width.  4.   GAIT WITH VERTICAL HEAD TURNS Instructions: Walk from here to the next mark (6 m [20 ft]). Begin walking at your normal pace. Keep walking straight; after 3 steps, tip your head up and keep walking straight while looking up. After 3 more steps, tip your head down, keep walking straight while looking down. Continue  alternating looking up and down every 3 steps until you have completed 2 repetitions in each direction. (3) Normal - Performs head turns with no change in gait. Deviates no more than 15.24 cm (6 in) outside 30.48-cm (12-in) walkway width.  5.  GAIT AND PIVOT TURN Instructions: Begin with walking at your normal pace. When I tell you, turn and stop, turn as quickly as you can to face the opposite direction and stop. (3) Normal - Pivot turns safely within 3 seconds and stops quickly with no loss of balance  6.   STEP OVER OBSTACLE Instructions: Begin walking at your normal speed. When you come to the shoe box, step over it, not around it, and keep walking. (1) Moderate  impairment - Is able to step over one shoe box (11.43 cm [4.5 in] total height) but must slow down and adjust steps to clear box safely. May require verbal cueing.  7.   GAIT WITH NARROW BASE OF SUPPORT Instructions: Walk on the floor with arms folded across the chest, feet aligned heel to toe in tandem for a distance of 3.6 m [12 ft]. The number of steps taken in a straight line are counted for a maximum of 10  steps. (1) Moderate impairment - Ambulates 4 -7 steps.  8.   GAIT WITH EYES CLOSED Instructions: Walk at your normal speed from here to the next mark (6 m [20 ft]) with your eyes closed. (1) Moderate impairment - Walks 6 m (20 ft), slow speed, abnormal gait pattern, evidence for imbalance, deviates 25.4 -38.1 cm (10 -15 in) outside 30.48-cm (12-in) walkway width. Requires more than 9 seconds to ambulate 6 m (20 ft).  9.38 seconds  9.   AMBULATING BACKWARDS Instructions: Walk backwards until I tell you to stop (2) Mild impairment - Walks 6 m (20 ft), uses assistive device, slower speed, mild gait deviations, deviates 15.24 -25.4 cm (6 -10 in) outside 30.48-cm (12-in) walkway width.  12.47 seconds  10. STEPS Instructions: Walk up these stairs as you would at home (ie, using the rail if necessary). At the top turn around and walk down. (2) Mild impairment-Alternating feet, must use rail.  Total 21/30   Interpretation of scores: Non-Specific Older Adults Cutoff Score: <=22/30 = risk of falls Parkinsons Disease Cutoff score <15/30= fall risk (Hoehn & Yahr 1-4)  Minimally Clinically Important Difference (MCID)  Stroke (acute, subacute, and chronic) = MDC: 4.2 points Vestibular (acute) = MDC: 6 points Community Dwelling Older Adults =  MCID: 4 points Parkinsons Disease  =  MDC: 4.3 points  (Academy of Neurologic Physical Therapy (nd). Functional Gait Assessment. Retrieved from  https://www.neuropt.org/docs/default-source/cpgs/core-outcome-measures/function-gait-assessment-pocket-guide-proof9-(2).pdf?sfvrsn=b2f35043_0.)**  PATIENT SURVEYS:  TBA  VITALS  Vitals:   08/01/24 1526  BP: (!) 147/79  Pulse: 85                                                                                                     TREATMENT: Self-care/home management/NMR/Ther act  Assessed vitals in LUE while seated (see above) and BP WNL.  SciFit multi-peaks level 8.5 for 8 minutes using BLEs only for neural priming for reciprocal movement, dynamic cardiovascular warmup and increased amplitude of stepping.  Educated pt on where to obtain stretching strap ([employer]) to work on ankle DF at home as pt reports he would like to work on stretching into DF w/full knee extension.  On rockerboard in A/P direction for improved ankle strategy, single leg stability and ankle ROM:  Alt PF/DF rocking w/intermittent UE support. Min cues to perform only w/ankles and avoid truncal lean compensation.  Alt retro step off board w/BUE support, x15 reps per side.  Alt fwd step off board w/BUE support, x15 reps per side.  Side stepping on purple beam wBUE support, x8 reps each direction for improved ankle strategy and stability. Pt initially very tense in L ankle, resulting in increased clonus and inversion. However, if maintaining a conversation w/pt, pt able to relax and noted reduced tone in L ankle.    PATIENT EDUCATION: Education details: Continue HEP, where to purchase stretching strap  Education method: Explanation, Demonstration, Tactile cues, and Verbal cues Education comprehension: verbalized understanding, returned demonstration, verbal cues required, tactile cues required, and needs further education  HOME EXERCISE PROGRAM: Access Code: [email or web address] URL: [email or web address] Date: 07/18/2024 Prepared by: Damien Caulk  Exercises -  Seated Toe Curl  - 1 x daily - 3-5 x weekly - 1-2  sets - 5-10 reps - Seated Ankle Dorsiflexion AROM  - 1 x daily - 3-5 x weekly - 1-2 sets - 5-10 reps - Seated Heel Raise  - 1 x daily - 3-5 x weekly - 1-2 sets - 5-10 reps - Standing Hip Extension with Counter Support  - 1 x daily - 3-5 x weekly - 1-2 sets - 10 reps - Mini Squat with Counter Support  - 1 x daily - 3-5 x weekly - 1-2 sets - 10 reps - Standing Hip Abduction with Counter Support  - 1 x daily - 3-5 x weekly - 1-2 sets - 10 reps - Side Step Overs with Cones and Counter Support  - 1 x daily - 3-5 x weekly - 1-2 sets - 10 reps - Calf stretch  - 1 x daily - 7 x weekly - 60-90 seconds hold  GOALS: Goals reviewed with patient? Yes  SHORT TERM GOALS = LONG TERM GOALS (d/t length of POC): Target date: 08/10/2024 (updated to match cert date)   Pt will be independent with HEP in order to improve strength and balance in order to decrease fall risk and improve function at home for ADL's and for work. Baseline: To be issued Goal status: INITIAL  2.  Patient will increase Functional Gait Assessment score to 25/30 or greater as to reduce fall risk and improve dynamic gait safety with community ambulation. Baseline: 21/30 Goal status: INITIAL  3.  Patient will ascend/descend 8 stairs without rail assist independently without loss of balance to improve ability to navigate steps in community. Baseline: mild unsteadiness at times; pt needing to take extra time and concentration to perform task; SBA for safety Goal status: INITIAL   ASSESSMENT:  CLINICAL IMPRESSION: Emphasis of skilled PT session on improved ankle ROM, ankle strategy and single leg stability. Pt reports he has been able to perform calf raises w/increased L calf activation at home and continues to work on reducing tenseness in L ankle during mobility. Pt demonstrated reduced clonus in LLE on SciFit today and is able to relax ankle more if distracted. Pt will continue to benefit from skilled PT session for improved ankle  stability and functional mobility to promote independence and reduce fall risk. Continue POC.   OBJECTIVE IMPAIRMENTS: Abnormal gait, decreased balance, decreased cognition, decreased knowledge of condition, decreased knowledge of use of DME, decreased mobility, difficulty walking, decreased ROM, decreased strength, decreased safety awareness, impaired sensation, and impaired tone.   ACTIVITY LIMITATIONS: stairs and locomotion level  PARTICIPATION LIMITATIONS: shopping, community activity, and occupation  PERSONAL FACTORS: Behavior pattern, Past/current experiences, Time since onset of injury/illness/exacerbation, and 1-2 comorbidities: PTSD, anxiety, ADD are also affecting patient's functional outcome.   REHAB POTENTIAL: Fair d/t time since intracranial hemorrhage  CLINICAL DECISION MAKING: Stable/uncomplicated  EVALUATION COMPLEXITY: Low  PLAN:  PT FREQUENCY: 2x/week  PT DURATION: 4 weeks  PLANNED INTERVENTIONS: [id number]- PT Re-evaluation, 97750- Physical Performance Testing, 97110-Therapeutic exercises, 97530- Therapeutic activity, [id number]- Neuromuscular re-education, 97535- Self Care, [id number]- Manual therapy, [id number]- Gait training, [phone]- Orthotic Initial, [phone]- Orthotic/Prosthetic subsequent, [phone]- Aquatic Therapy, [phone]- Electrical stimulation (manual), Patient/Family education, Balance training, Stair training, Taping, Joint mobilization, Vestibular training, DME instructions, Cryotherapy, Moist heat, and Biofeedback  PLAN FOR NEXT SESSION: Check on HEP.  Balance; LE strength; endurance.  Consider switching some land based therapy to aquatic based therapy sessions (none scheduled yet d/t pt appearing anxious with water). Bioness. How is  TENS unit? Calf raises. BAPS   Marlon BRAVO Dillin Lofgren, PT, DPT 08/01/2024, 4:16 PM         "

## 2024-08-02 ENCOUNTER — Telehealth: Payer: Self-pay | Admitting: *Deleted

## 2024-08-02 ENCOUNTER — Other Ambulatory Visit: Payer: Self-pay | Admitting: Physical Medicine and Rehabilitation

## 2024-08-02 MED ORDER — PANTOPRAZOLE SODIUM 20 MG PO TBEC: 20.0000 mg | DELAYED_RELEASE_TABLET | Freq: Every day | ORAL | 1 refills | Status: AC

## 2024-08-02 MED ORDER — CLONAZEPAM 0.5 MG PO TABS: 0.2500 mg | ORAL_TABLET | Freq: Every day | ORAL | 0 refills | Status: AC | PRN

## 2024-08-02 NOTE — Telephone Encounter (Signed)
 Reviewed chart, last refill >1 month ago, sent

## 2024-08-02 NOTE — Telephone Encounter (Signed)
 Erik Santana is calling for a refill on his klonopin  and pantoprazole .

## 2024-08-03 ENCOUNTER — Telehealth: Payer: Self-pay

## 2024-08-03 ENCOUNTER — Ambulatory Visit: Admitting: Physical Therapy

## 2024-08-03 VITALS — BP 137/90 | HR 97

## 2024-08-03 DIAGNOSIS — R2689 Other abnormalities of gait and mobility: Secondary | ICD-10-CM

## 2024-08-03 DIAGNOSIS — R29818 Other symptoms and signs involving the nervous system: Secondary | ICD-10-CM

## 2024-08-03 DIAGNOSIS — R2681 Unsteadiness on feet: Secondary | ICD-10-CM

## 2024-08-03 DIAGNOSIS — M6281 Muscle weakness (generalized): Secondary | ICD-10-CM

## 2024-08-03 DIAGNOSIS — R278 Other lack of coordination: Secondary | ICD-10-CM

## 2024-08-03 NOTE — Therapy (Signed)
 " OUTPATIENT PHYSICAL THERAPY NEURO TREATMENT   Patient Name: Erik Santana. MRN: [id number] DOB:06-24-1991, 34 y.o., male Today's Date: 08/03/2024   PCP: Jaycee Greig PARAS, NP REFERRING PROVIDER: Emeline Joesph BROCKS, DO  END OF SESSION:  PT End of Session - 08/03/24 1533     Visit Number 7    Number of Visits 9   8 visits plus Eval   Date for Recertification  08/17/24    Authorization Type [facility] Medicaid    Authorization Time Period 8 visits (07/18/24-08/24/24)    Authorization - Number of Visits 8    PT Start Time 1532    PT Stop Time 1616    PT Time Calculation (min) 44 min    Equipment Utilized During Treatment Other (comment)   L AFO   Activity Tolerance Patient tolerated treatment well    Behavior During Therapy Impulsive;WFL for tasks assessed/performed           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   Past Surgical History:  Procedure Laterality Date   ESOPHAGOGASTRODUODENOSCOPY N/A 10/18/2012   Procedure: ESOPHAGOGASTRODUODENOSCOPY (EGD);  Surgeon: Lamar JONETTA Aho, MD;  Location: [facility];  Service: Endoscopy;  Laterality: N/A;   ESOPHAGOGASTRODUODENOSCOPY (EGD) WITH PROPOFOL  N/A 10/18/2012   Procedure: ESOPHAGOGASTRODUODENOSCOPY (EGD) WITH PROPOFOL ;  Surgeon: Lamar JONETTA Aho, MD;  Location: [facility];  Service: Endoscopy;  Laterality: N/A;   FINGER SURGERY     IR ANGIO INTRA EXTRACRAN SEL COM CAROTID INNOMINATE BILAT MOD SED  08/23/2023   IR ANGIO VERTEBRAL SEL SUBCLAVIAN INNOMINATE UNI L MOD SED  08/23/2023   IR ANGIO VERTEBRAL SEL VERTEBRAL UNI R MOD SED  08/23/2023   OPEN REDUCTION INTERNAL FIXATION (ORIF) PROXIMAL PHALANX Right 10/12/2022   Procedure: Right ring finger middle phalanx and proximal interphalangeal joint closed reduction internal percutaneous pinning;  Surgeon: Alyse Agent, MD;  Location: [facility];  Service: Orthopedics;  Laterality: Right;   Patient Active Problem List   Diagnosis Date Noted   Neuropathic pain 06/27/2024   Gynecomastia, male 05/05/2024   Left-sided chest wall pain 05/05/2024   Testicular discomfort 05/05/2024   Allodynia 05/04/2024   Hemisensory deficit 05/04/2024   Adjustment disorder with mixed anxiety and depressed mood 02/17/2024   Gastroesophageal reflux disease 02/08/2024   Seizure-like activity (HCC) 12/16/2023   PTSD (post-traumatic stress disorder) 12/07/2023   Left hemiparesis (HCC) 12/07/2023   Spasticity 12/07/2023   Emotional lability 10/05/2023   Left foot drop 10/05/2023   Cognitive change 08/30/2023   Intraparenchymal hematoma of brain (HCC) 08/24/2023   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   Anxiety disorder 10/27/2012   Hematemesis 10/18/2012   Other esophagitis 10/18/2012   Mallory-Weiss tear 10/18/2012    ONSET DATE: 05/29/2024 (date of referral)  REFERRING DIAG: I62.9 (ICD-10-CM) - Non-traumatic intracranial hemorrhage (HCC) M21.372 (ICD-10-CM) - Left foot drop  THERAPY DIAG:  Other abnormalities of gait and mobility  Other symptoms and signs involving the nervous system  Other lack of coordination  Muscle weakness (generalized)  Unsteadiness on feet  Rationale for Evaluation and Treatment: Rehabilitation  SUBJECTIVE:  SUBJECTIVE STATEMENT: Pt presents holding snacks and wearing AFO. Wanting a band to work on stretching at home. No falls or acute changes.   Pt accompanied by: self (drove)  PERTINENT HISTORY: Pt presented to hospital 08/21/23 with acute onset L sided weakness, dizziness, and HA.  CT of the head showed a 3.9 x 2.4 x 4.1 cm parenchymal hemorrhage centered at the right frontal parietal junction.   Admitted to IP rehab 08/24/23 and discharged 09/22/23.  PT referral for: Evaluate and treat. Aquatherapy for ICH with L tone/hemiparesis; work on increased resistance, mobilization, endurance improvements and management of tone.  PMH includes ADD, anxiety, asthma, GERD, Mallory-Weiss tear (09/26/2012).  Per recent DO Engler note 06/27/24 pt with L hemiparesis, spasticity, anxiety disorder (panic attacks), neuropathic pain, and seizure like activity (waiting for further workup with neurology).  H/o Botox  for spasticity management.  PAIN:  Are you having pain? No  PRECAUTIONS: Fall; anxiety; L tone.  RED FLAGS: None   WEIGHT BEARING RESTRICTIONS: No  FALLS: Has patient fallen in last 6 months? 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  Independent with ambulation with use of L AFO.  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  Get ankle and toes on L side working.  Be able to run.  OBJECTIVE:  Note: Objective measures were completed at Evaluation unless otherwise noted.  DIAGNOSTIC FINDINGS: CT of head 08/21/23:  3.9 x 2.4 x 4.1 cm parenchymal hemorrhage centered at the right frontoparietal junction. No evidence of intraventricular extension. No mass effect.  COGNITION: Overall cognitive status: A&Ox4; appearing anxious and restless   SENSATION: More sensitive L foot on bottom; numbness reported all toes (light touch intact per pt report)  COORDINATION: Good B heel to shin coordination in sitting  EDEMA:  Mild swelling noted L foot  MUSCLE TONE: LLE: increased tone (10 beats clonus L ankle)  POSTURE: rounded shoulders and forward head  LOWER EXTREMITY ROM:     Passive  Right Eval Left Eval  Hip flexion    Hip extension    Hip abduction    Hip adduction     Hip internal rotation    Hip external rotation    Knee flexion    Knee extension    Ankle dorsiflexion  To neutral  Ankle plantarflexion    Ankle inversion    Ankle eversion     (Blank rows = not tested)  LOWER EXTREMITY MMT:    MMT Right Eval Left Eval  Hip flexion 5/5 4+/5  Hip extension    Hip abduction    Hip adduction    Hip internal rotation    Hip external rotation    Knee flexion 5/5 4/5  Knee extension 5/5 4+/5  Ankle dorsiflexion 5/5 1/5  Ankle plantarflexion At least 3/5 1-2/5  Ankle inversion    Ankle eversion    (Blank rows = not tested)  BED MOBILITY:  Independent  TRANSFERS: Sit to stand: Complete Independence  Assistive device utilized: None     Stand to sit: Complete Independence  Assistive device utilized: None      STAIRS: Findings: Level of Assistance: SBA, Stair Negotiation Technique: Alternating Pattern  with No Rails, Number of Stairs: 8, Height of Stairs: 6 inch   , and Comments: mild unsteadiness at times; pt needing to take extra time  and concentration to perform task; SBA for safety GAIT: Findings: Gait Characteristics: step through gait pattern, Distance walked: clinic distances, Assistive device utilized:None, Level of assistance: Complete Independence, and Comments: use of L AFO  FUNCTIONAL TESTS:  5 times sit to stand: 14.91 seconds no UE support Functional gait assessment: 21/30 FUNCTIONAL GAIT ASSESSMENT  Date: 07/06/24 Score  GAIT LEVEL SURFACE Instructions: Walk at your normal speed from here to the next mark (6 m) [20 ft]. (2) Mild impairment - Walks 6 m (20 ft) in less than 7 seconds but greater than 5.5 seconds, uses assistive device, slower speed, mild gait deviations, or deviates 15.24 -25.4 cm (6 -10 in) outside of the 30.48-cm (12-in) walkway width.  6.78 seconds  2.   CHANGE IN GAIT SPEED Instructions: Begin walking at your normal pace (for 1.5 m [5 ft]). When I tell you go, walk as fast as you can (for 1.5 m [5 ft]). When  I tell you slow, walk as slowly as you can (for 1.5 m [5 ft]. (3) Normal - Able to smoothly change walking speed without loss of balance or gait deviation. Shows a significant difference in walking speeds between normal, fast, and slow speeds. Deviates no more than 15.24 cm (6 in) outside of the 30.48-cm (12-in) walkway width.  3.    GAIT WITH HORIZONTAL HEAD TURNS Instructions: Walk from here to the next mark 6 m (20 ft) away. Begin walking at your normal pace. Keep walking straight; after 3 steps, turn your head to the right and keep walking straight while looking to the right. After 3 more steps, turn your head to the left and keep walking straight while looking left. Continue alternating looking right and left. (3) Normal - Performs head turns smoothly with no change in gait. Deviates no more than 15.24 cm (6 in) outside 30.48-cm (12-in) walkway width.  4.   GAIT WITH VERTICAL HEAD TURNS Instructions: Walk from here to the next mark (6 m [20 ft]). Begin walking at your normal pace. Keep walking straight; after 3 steps, tip your head up and keep walking straight while looking up. After 3 more steps, tip your head down, keep walking straight while looking down. Continue  alternating looking up and down every 3 steps until you have completed 2 repetitions in each direction. (3) Normal - Performs head turns with no change in gait. Deviates no more than 15.24 cm (6 in) outside 30.48-cm (12-in) walkway width.  5.  GAIT AND PIVOT TURN Instructions: Begin with walking at your normal pace. When I tell you, turn and stop, turn as quickly as you can to face the opposite direction and stop. (3) Normal - Pivot turns safely within 3 seconds and stops quickly with no loss of balance  6.   STEP OVER OBSTACLE Instructions: Begin walking at your normal speed. When you come to the shoe box, step over it, not around it, and keep walking. (1) Moderate impairment - Is able to step over one shoe box (11.43 cm [4.5 in]  total height) but must slow down and adjust steps to clear box safely. May require verbal cueing.  7.   GAIT WITH NARROW BASE OF SUPPORT Instructions: Walk on the floor with arms folded across the chest, feet aligned heel to toe in tandem for a distance of 3.6 m [12 ft]. The number of steps taken in a straight line are counted for a maximum of 10 steps. (1) Moderate impairment - Ambulates 4 -7 steps.  8.  GAIT WITH EYES CLOSED Instructions: Walk at your normal speed from here to the next mark (6 m [20 ft]) with your eyes closed. (1) Moderate impairment - Walks 6 m (20 ft), slow speed, abnormal gait pattern, evidence for imbalance, deviates 25.4 -38.1 cm (10 -15 in) outside 30.48-cm (12-in) walkway width. Requires more than 9 seconds to ambulate 6 m (20 ft).  9.38 seconds  9.   AMBULATING BACKWARDS Instructions: Walk backwards until I tell you to stop (2) Mild impairment - Walks 6 m (20 ft), uses assistive device, slower speed, mild gait deviations, deviates 15.24 -25.4 cm (6 -10 in) outside 30.48-cm (12-in) walkway width.  12.47 seconds  10. STEPS Instructions: Walk up these stairs as you would at home (ie, using the rail if necessary). At the top turn around and walk down. (2) Mild impairment-Alternating feet, must use rail.  Total 21/30   Interpretation of scores: Non-Specific Older Adults Cutoff Score: <=22/30 = risk of falls Parkinsons Disease Cutoff score <15/30= fall risk (Hoehn & Yahr 1-4)  Minimally Clinically Important Difference (MCID)  Stroke (acute, subacute, and chronic) = MDC: 4.2 points Vestibular (acute) = MDC: 6 points Community Dwelling Older Adults =  MCID: 4 points Parkinsons Disease  =  MDC: 4.3 points  (Academy of Neurologic Physical Therapy (nd). Functional Gait Assessment. Retrieved from https://www.neuropt.org/docs/default-source/cpgs/core-outcome-measures/function-gait-assessment-pocket-guide-proof9-(2).pdf?sfvrsn=b52f35043_0.)**  PATIENT SURVEYS:  TBA  VITALS   Vitals:   08/03/24 1535  BP: (!) 137/90  Pulse: 97                                                                                                      TREATMENT: Self-care/home management/NMR/Ther act  Assessed vitals in LUE while seated (see above) and BP WNL.  Pt removed AFO and placed orthotic in shoe for entirety of session (except SciFit).  SciFit multi-peaks level 9.0 for 8 minutes using BLEs only for neural priming for reciprocal movement, dynamic cardiovascular warmup and increased amplitude of stepping. Decreased clonus noted this date and pt able to increase his speed of stepping. Pt performed without shoes on.  For improved ankle strategy, ankle stability, single leg stability and reactive balance strategies:  Placed various obstacles under blue mat (gumdrops, dynadisc) and had pt walk over mat, x7 reps in fwd direction and x4 reps laterally without UE support. Min cues to allow L ankle to invert to accommodate obstacles and continued to educate pt on importance of allowing ankles to adapt to terrain for improved stability.  Alt step ups on Bosu (blue side) w/BUE support and 5-10s isometric hold, x15 reps per side. Cued pt to PF L ankle by pushing the gas pedal to assist w/ankle position, which worked well. Min tactile cues applied to R pelvis to avoid rotating posteriorly when standing on LLE.    PATIENT EDUCATION: Education details: Continue HEP  Education method: Explanation, Demonstration, Tactile cues, and Verbal cues Education comprehension: verbalized understanding, returned demonstration, verbal cues required, tactile cues required, and needs further education  HOME EXERCISE PROGRAM: Access Code: [email or web address] URL: [email or web address] Date: 07/18/2024 Prepared by: Damien Caulk  Exercises - Seated  Toe Curl  - 1 x daily - 3-5 x weekly - 1-2 sets - 5-10 reps - Seated Ankle Dorsiflexion AROM  - 1 x daily - 3-5 x weekly - 1-2 sets - 5-10 reps - Seated  Heel Raise  - 1 x daily - 3-5 x weekly - 1-2 sets - 5-10 reps - Standing Hip Extension with Counter Support  - 1 x daily - 3-5 x weekly - 1-2 sets - 10 reps - Mini Squat with Counter Support  - 1 x daily - 3-5 x weekly - 1-2 sets - 10 reps - Standing Hip Abduction with Counter Support  - 1 x daily - 3-5 x weekly - 1-2 sets - 10 reps - Side Step Overs with Cones and Counter Support  - 1 x daily - 3-5 x weekly - 1-2 sets - 10 reps - Calf stretch  - 1 x daily - 7 x weekly - 60-90 seconds hold  GOALS: Goals reviewed with patient? Yes  SHORT TERM GOALS = LONG TERM GOALS (d/t length of POC): Target date: 08/10/2024 (updated to match cert date)   Pt will be independent with HEP in order to improve strength and balance in order to decrease fall risk and improve function at home for ADL's and for work. Baseline: To be issued Goal status: INITIAL  2.  Patient will increase Functional Gait Assessment score to 25/30 or greater as to reduce fall risk and improve dynamic gait safety with community ambulation. Baseline: 21/30 Goal status: INITIAL  3.  Patient will ascend/descend 8 stairs without rail assist independently without loss of balance to improve ability to navigate steps in community. Baseline: mild unsteadiness at times; pt needing to take extra time and concentration to perform task; SBA for safety Goal status: INITIAL   ASSESSMENT:  CLINICAL IMPRESSION: Emphasis of skilled PT session on improved ankle strategy, single leg stability and reactive balance. Pt demonstrated reduced clonus on SciFit this date and improved ankle strategy on unlevel surfaces. Pt continues to avoid ankle inversion on unlevel surfaces, resulting in instability. However, with verbal cues to allow ankle to move, pt able to stabilize more efficiently w/reduced tone. Pt reports he was interested in aquatic therapy to learn how to swim, but informed pt that therapist cannot assist with this. Recommended pt look into swim  lessons at [employer] or [employer]. Continue POC.   OBJECTIVE IMPAIRMENTS: Abnormal gait, decreased balance, decreased cognition, decreased knowledge of condition, decreased knowledge of use of DME, decreased mobility, difficulty walking, decreased ROM, decreased strength, decreased safety awareness, impaired sensation, and impaired tone.   ACTIVITY LIMITATIONS: stairs and locomotion level  PARTICIPATION LIMITATIONS: shopping, community activity, and occupation  PERSONAL FACTORS: Behavior pattern, Past/current experiences, Time since onset of injury/illness/exacerbation, and 1-2 comorbidities: PTSD, anxiety, ADD are also affecting patient's functional outcome.   REHAB POTENTIAL: Fair d/t time since intracranial hemorrhage  CLINICAL DECISION MAKING: Stable/uncomplicated  EVALUATION COMPLEXITY: Low  PLAN:  PT FREQUENCY: 2x/week  PT DURATION: 4 weeks  PLANNED INTERVENTIONS: [id number]- PT Re-evaluation, 97750- Physical Performance Testing, 97110-Therapeutic exercises, 97530- Therapeutic activity, [id number]- Neuromuscular re-education, 97535- Self Care, [id number]- Manual therapy, [id number]- Gait training, [phone]- Orthotic Initial, [phone]- Orthotic/Prosthetic subsequent, [phone]- Aquatic Therapy, [phone]- Electrical stimulation (manual), Patient/Family education, Balance training, Stair training, Taping, Joint mobilization, Vestibular training, DME instructions, Cryotherapy, Moist heat, and Biofeedback  PLAN FOR NEXT SESSION: Check on HEP.  Balance; LE strength; endurance.  Consider switching some land based therapy to aquatic based therapy sessions (none scheduled yet d/t pt appearing  anxious with water). Bioness. How is TENS unit? Calf raises. BAPS. Pt wants to walk outside on unlevel surfaces without AFO on    Emlyn Maves E Sylus Stgermain, PT, DPT 08/03/2024, 4:21 PM         "

## 2024-08-06 ENCOUNTER — Encounter: Admitting: Psychology

## 2024-08-08 ENCOUNTER — Ambulatory Visit: Admitting: Physical Therapy

## 2024-08-10 ENCOUNTER — Ambulatory Visit: Admitting: Physical Therapy

## 2024-08-10 ENCOUNTER — Telehealth: Payer: Self-pay

## 2024-08-16 ENCOUNTER — Ambulatory Visit: Admitting: Family

## 2024-08-20 ENCOUNTER — Encounter: Admitting: Psychology

## 2024-09-26 ENCOUNTER — Encounter: Admitting: Physical Medicine and Rehabilitation

## 2024-10-29 ENCOUNTER — Ambulatory Visit: Admitting: Family

## 2024-12-24 ENCOUNTER — Ambulatory Visit: Admitting: Diagnostic Neuroimaging
